# Patient Record
Sex: Male | Born: 1960
Health system: Southern US, Community
[De-identification: ages and names within clinical notes are randomized; demographics above are authoritative.]

## PROBLEM LIST (undated history)

## (undated) DIAGNOSIS — Z87442 Personal history of urinary calculi: Secondary | ICD-10-CM

## (undated) DIAGNOSIS — I10 Essential (primary) hypertension: Secondary | ICD-10-CM

## (undated) DIAGNOSIS — E785 Hyperlipidemia, unspecified: Secondary | ICD-10-CM

## (undated) DIAGNOSIS — I2510 Atherosclerotic heart disease of native coronary artery without angina pectoris: Secondary | ICD-10-CM

## (undated) DIAGNOSIS — F319 Bipolar disorder, unspecified: Secondary | ICD-10-CM

## (undated) DIAGNOSIS — I639 Cerebral infarction, unspecified: Secondary | ICD-10-CM

## (undated) DIAGNOSIS — G459 Transient cerebral ischemic attack, unspecified: Secondary | ICD-10-CM

## (undated) DIAGNOSIS — R112 Nausea with vomiting, unspecified: Secondary | ICD-10-CM

## (undated) DIAGNOSIS — K219 Gastro-esophageal reflux disease without esophagitis: Secondary | ICD-10-CM

## (undated) DIAGNOSIS — R0602 Shortness of breath: Secondary | ICD-10-CM

## (undated) DIAGNOSIS — N289 Disorder of kidney and ureter, unspecified: Secondary | ICD-10-CM

## (undated) DIAGNOSIS — F99 Mental disorder, not otherwise specified: Secondary | ICD-10-CM

## (undated) DIAGNOSIS — I4892 Unspecified atrial flutter: Secondary | ICD-10-CM

## (undated) DIAGNOSIS — J189 Pneumonia, unspecified organism: Secondary | ICD-10-CM

## (undated) DIAGNOSIS — Z9889 Other specified postprocedural states: Secondary | ICD-10-CM

## (undated) HISTORY — PX: LUMBAR DISC SURGERY: SHX700

## (undated) HISTORY — PX: KNEE ARTHROSCOPY: SUR90

## (undated) HISTORY — PX: HEMORROIDECTOMY: SUR656

## (undated) HISTORY — PX: TONSILLECTOMY AND ADENOIDECTOMY: SUR1326

---

## 1998-08-31 ENCOUNTER — Ambulatory Visit (HOSPITAL_BASED_OUTPATIENT_CLINIC_OR_DEPARTMENT_OTHER): Admission: RE | Admit: 1998-08-31 | Discharge: 1998-08-31 | Payer: Self-pay | Admitting: General Surgery

## 1998-11-03 ENCOUNTER — Emergency Department (HOSPITAL_COMMUNITY): Admission: EM | Admit: 1998-11-03 | Discharge: 1998-11-03 | Payer: Self-pay

## 2000-01-06 ENCOUNTER — Encounter: Payer: Self-pay | Admitting: Emergency Medicine

## 2000-01-06 ENCOUNTER — Emergency Department (HOSPITAL_COMMUNITY): Admission: EM | Admit: 2000-01-06 | Discharge: 2000-01-06 | Payer: Self-pay | Admitting: Emergency Medicine

## 2000-05-01 ENCOUNTER — Emergency Department (HOSPITAL_COMMUNITY): Admission: EM | Admit: 2000-05-01 | Discharge: 2000-05-01 | Payer: Self-pay | Admitting: Emergency Medicine

## 2000-05-17 ENCOUNTER — Encounter: Payer: Self-pay | Admitting: Emergency Medicine

## 2000-05-17 ENCOUNTER — Emergency Department (HOSPITAL_COMMUNITY): Admission: EM | Admit: 2000-05-17 | Discharge: 2000-05-17 | Payer: Self-pay | Admitting: Emergency Medicine

## 2000-10-20 ENCOUNTER — Encounter: Payer: Self-pay | Admitting: Emergency Medicine

## 2000-10-20 ENCOUNTER — Emergency Department (HOSPITAL_COMMUNITY): Admission: EM | Admit: 2000-10-20 | Discharge: 2000-10-20 | Payer: Self-pay | Admitting: Emergency Medicine

## 2000-10-27 ENCOUNTER — Encounter: Payer: Self-pay | Admitting: Specialist

## 2000-10-27 ENCOUNTER — Ambulatory Visit (HOSPITAL_COMMUNITY): Admission: RE | Admit: 2000-10-27 | Discharge: 2000-10-27 | Payer: Self-pay | Admitting: Specialist

## 2001-03-02 ENCOUNTER — Encounter: Admission: RE | Admit: 2001-03-02 | Discharge: 2001-05-31 | Payer: Self-pay | Admitting: Anesthesiology

## 2001-03-19 ENCOUNTER — Emergency Department (HOSPITAL_COMMUNITY): Admission: EM | Admit: 2001-03-19 | Discharge: 2001-03-19 | Payer: Self-pay | Admitting: Emergency Medicine

## 2001-07-05 ENCOUNTER — Encounter: Payer: Self-pay | Admitting: Emergency Medicine

## 2001-07-05 ENCOUNTER — Emergency Department (HOSPITAL_COMMUNITY): Admission: EM | Admit: 2001-07-05 | Discharge: 2001-07-05 | Payer: Self-pay | Admitting: Emergency Medicine

## 2001-07-28 ENCOUNTER — Encounter: Admission: RE | Admit: 2001-07-28 | Discharge: 2001-08-14 | Payer: Self-pay | Admitting: Anesthesiology

## 2002-09-30 ENCOUNTER — Emergency Department (HOSPITAL_COMMUNITY): Admission: EM | Admit: 2002-09-30 | Discharge: 2002-10-01 | Payer: Self-pay | Admitting: Emergency Medicine

## 2002-09-30 ENCOUNTER — Encounter: Payer: Self-pay | Admitting: Emergency Medicine

## 2002-11-04 ENCOUNTER — Emergency Department (HOSPITAL_COMMUNITY): Admission: EM | Admit: 2002-11-04 | Discharge: 2002-11-04 | Payer: Self-pay | Admitting: Emergency Medicine

## 2002-12-11 ENCOUNTER — Emergency Department (HOSPITAL_COMMUNITY): Admission: EM | Admit: 2002-12-11 | Discharge: 2002-12-11 | Payer: Self-pay

## 2004-09-29 ENCOUNTER — Emergency Department (HOSPITAL_COMMUNITY): Admission: EM | Admit: 2004-09-29 | Discharge: 2004-09-29 | Payer: Self-pay | Admitting: Emergency Medicine

## 2005-02-11 ENCOUNTER — Ambulatory Visit: Payer: Self-pay | Admitting: Family Medicine

## 2005-02-14 ENCOUNTER — Ambulatory Visit: Payer: Self-pay | Admitting: Family Medicine

## 2005-02-20 ENCOUNTER — Ambulatory Visit: Payer: Self-pay | Admitting: Family Medicine

## 2005-02-25 ENCOUNTER — Ambulatory Visit: Payer: Self-pay | Admitting: Family Medicine

## 2005-10-20 ENCOUNTER — Emergency Department (HOSPITAL_COMMUNITY): Admission: EM | Admit: 2005-10-20 | Discharge: 2005-10-20 | Payer: Self-pay | Admitting: Emergency Medicine

## 2006-05-27 ENCOUNTER — Ambulatory Visit: Payer: Self-pay | Admitting: Family Medicine

## 2007-02-01 ENCOUNTER — Emergency Department (HOSPITAL_COMMUNITY): Admission: EM | Admit: 2007-02-01 | Discharge: 2007-02-01 | Payer: Self-pay | Admitting: Emergency Medicine

## 2008-04-20 ENCOUNTER — Emergency Department (HOSPITAL_COMMUNITY): Admission: EM | Admit: 2008-04-20 | Discharge: 2008-04-20 | Payer: Self-pay | Admitting: Certified Registered"

## 2008-04-27 ENCOUNTER — Emergency Department (HOSPITAL_COMMUNITY): Admission: EM | Admit: 2008-04-27 | Discharge: 2008-04-27 | Payer: Self-pay | Admitting: Emergency Medicine

## 2008-05-26 ENCOUNTER — Ambulatory Visit: Payer: Self-pay | Admitting: *Deleted

## 2008-05-26 ENCOUNTER — Ambulatory Visit: Payer: Self-pay | Admitting: Internal Medicine

## 2008-07-12 ENCOUNTER — Ambulatory Visit: Payer: Self-pay | Admitting: Internal Medicine

## 2008-08-04 ENCOUNTER — Ambulatory Visit: Payer: Self-pay | Admitting: Internal Medicine

## 2008-10-19 ENCOUNTER — Ambulatory Visit: Payer: Self-pay | Admitting: Internal Medicine

## 2008-12-21 ENCOUNTER — Ambulatory Visit: Payer: Self-pay | Admitting: Internal Medicine

## 2009-02-09 ENCOUNTER — Encounter (INDEPENDENT_AMBULATORY_CARE_PROVIDER_SITE_OTHER): Payer: Self-pay | Admitting: Adult Health

## 2009-02-09 ENCOUNTER — Ambulatory Visit: Payer: Self-pay | Admitting: Internal Medicine

## 2009-02-09 LAB — CONVERTED CEMR LAB
ALT: 14 units/L (ref 0–53)
AST: 14 units/L (ref 0–37)
Albumin: 4.5 g/dL (ref 3.5–5.2)
Basophils Absolute: 0.1 10*3/uL (ref 0.0–0.1)
Basophils Relative: 1 % (ref 0–1)
CO2: 20 meq/L (ref 19–32)
Calcium: 9.3 mg/dL (ref 8.4–10.5)
Cholesterol: 160 mg/dL (ref 0–200)
Glucose, Bld: 83 mg/dL (ref 70–99)
HDL: 26 mg/dL — ABNORMAL LOW (ref >39)
LDL Cholesterol: 90 mg/dL (ref 0–99)
Lymphocytes Relative: 26 % (ref 12–46)
Lymphs Abs: 2.9 10*3/uL (ref 0.7–4.0)
MCHC: 30.5 g/dL (ref 30.0–36.0)
Monocytes Absolute: 0.5 10*3/uL (ref 0.1–1.0)
Monocytes Relative: 4 % (ref 3–12)
Neutro Abs: 7.3 10*3/uL (ref 1.7–7.7)
Neutrophils Relative %: 64 % (ref 43–77)
RBC: 5.26 M/uL (ref 4.22–5.81)
Total Bilirubin: 0.4 mg/dL (ref 0.3–1.2)
Total CHOL/HDL Ratio: 6.2
Total Protein: 6.6 g/dL (ref 6.0–8.3)
VLDL: 44 mg/dL — ABNORMAL HIGH (ref 0–40)

## 2009-02-15 ENCOUNTER — Ambulatory Visit: Payer: Self-pay | Admitting: Internal Medicine

## 2009-05-16 ENCOUNTER — Ambulatory Visit: Payer: Self-pay | Admitting: Internal Medicine

## 2009-05-22 ENCOUNTER — Ambulatory Visit (HOSPITAL_COMMUNITY): Admission: RE | Admit: 2009-05-22 | Discharge: 2009-05-22 | Payer: Self-pay | Admitting: Internal Medicine

## 2009-06-27 ENCOUNTER — Ambulatory Visit: Payer: Self-pay | Admitting: Internal Medicine

## 2009-08-14 ENCOUNTER — Ambulatory Visit: Payer: Self-pay | Admitting: Internal Medicine

## 2009-10-11 ENCOUNTER — Ambulatory Visit: Payer: Self-pay | Admitting: Internal Medicine

## 2009-12-21 ENCOUNTER — Encounter (INDEPENDENT_AMBULATORY_CARE_PROVIDER_SITE_OTHER): Payer: Self-pay | Admitting: Family Medicine

## 2009-12-21 ENCOUNTER — Ambulatory Visit: Payer: Self-pay | Admitting: Internal Medicine

## 2009-12-21 DIAGNOSIS — IMO0002 Reserved for concepts with insufficient information to code with codable children: Secondary | ICD-10-CM

## 2009-12-21 DIAGNOSIS — R079 Chest pain, unspecified: Secondary | ICD-10-CM

## 2009-12-21 DIAGNOSIS — G43909 Migraine, unspecified, not intractable, without status migrainosus: Secondary | ICD-10-CM | POA: Insufficient documentation

## 2009-12-21 DIAGNOSIS — I10 Essential (primary) hypertension: Secondary | ICD-10-CM | POA: Insufficient documentation

## 2009-12-21 DIAGNOSIS — E119 Type 2 diabetes mellitus without complications: Secondary | ICD-10-CM | POA: Insufficient documentation

## 2009-12-21 DIAGNOSIS — R3129 Other microscopic hematuria: Secondary | ICD-10-CM | POA: Insufficient documentation

## 2009-12-21 DIAGNOSIS — M25569 Pain in unspecified knee: Secondary | ICD-10-CM | POA: Insufficient documentation

## 2009-12-21 LAB — CONVERTED CEMR LAB
Barbiturate Quant, Ur: NEGATIVE
Creatinine,U: 158.3 mg/dL
Methadone: NEGATIVE
Microalb, Ur: 1.43 mg/dL (ref 0.00–1.89)

## 2010-03-07 ENCOUNTER — Ambulatory Visit: Payer: Self-pay | Admitting: Internal Medicine

## 2010-03-07 LAB — CONVERTED CEMR LAB
CO2: 21 meq/L (ref 19–32)
Cholesterol: 162 mg/dL (ref 0–200)
Creatinine, Ser: 0.81 mg/dL (ref 0.40–1.50)
Glucose, Bld: 105 mg/dL — ABNORMAL HIGH (ref 70–99)
HCV Ab: NEGATIVE
HDL: 35 mg/dL — ABNORMAL LOW (ref >39)
Hep B S Ab: NEGATIVE
LDL Cholesterol: 101 mg/dL — ABNORMAL HIGH (ref 0–99)
Potassium: 3.9 meq/L (ref 3.5–5.3)
Sodium: 141 meq/L (ref 135–145)
Total CHOL/HDL Ratio: 4.6
Triglycerides: 132 mg/dL (ref <150)
VLDL: 26 mg/dL (ref 0–40)

## 2010-03-11 ENCOUNTER — Ambulatory Visit: Payer: Self-pay | Admitting: Internal Medicine

## 2010-03-14 ENCOUNTER — Encounter (INDEPENDENT_AMBULATORY_CARE_PROVIDER_SITE_OTHER): Payer: Self-pay | Admitting: *Deleted

## 2010-03-21 ENCOUNTER — Ambulatory Visit: Payer: Self-pay | Admitting: Internal Medicine

## 2010-03-25 ENCOUNTER — Ambulatory Visit: Payer: Self-pay | Admitting: Internal Medicine

## 2010-04-29 ENCOUNTER — Telehealth (INDEPENDENT_AMBULATORY_CARE_PROVIDER_SITE_OTHER): Payer: Self-pay | Admitting: *Deleted

## 2010-04-30 ENCOUNTER — Ambulatory Visit: Payer: Self-pay | Admitting: Adult Health

## 2010-04-30 ENCOUNTER — Ambulatory Visit (HOSPITAL_COMMUNITY): Admission: RE | Admit: 2010-04-30 | Discharge: 2010-04-30 | Payer: Self-pay | Admitting: Internal Medicine

## 2010-04-30 LAB — CONVERTED CEMR LAB
ALT: 17 units/L (ref 0–53)
AST: 15 units/L (ref 0–37)
Alkaline Phosphatase: 71 units/L (ref 39–117)
BUN: 20 mg/dL (ref 6–23)
Basophils Relative: 0 % (ref 0–1)
CO2: 24 meq/L (ref 19–32)
Calcium: 9.5 mg/dL (ref 8.4–10.5)
Creatinine, Ser: 0.87 mg/dL (ref 0.40–1.50)
Eosinophils Absolute: 0.5 10*3/uL (ref 0.0–0.7)
HCT: 47.4 % (ref 39.0–52.0)
Hemoglobin: 14.8 g/dL (ref 13.0–17.0)
MCHC: 31.2 g/dL (ref 30.0–36.0)
Monocytes Absolute: 0.6 10*3/uL (ref 0.1–1.0)
Monocytes Relative: 4 % (ref 3–12)
Neutrophils Relative %: 76 % (ref 43–77)
Platelets: 315 10*3/uL (ref 150–400)
Sodium: 140 meq/L (ref 135–145)

## 2010-05-03 ENCOUNTER — Ambulatory Visit: Payer: Self-pay | Admitting: Internal Medicine

## 2010-05-08 ENCOUNTER — Ambulatory Visit: Payer: Self-pay | Admitting: Internal Medicine

## 2010-05-08 LAB — CONVERTED CEMR LAB
ALT: 18 units/L (ref 0–53)
AST: 13 units/L (ref 0–37)
Albumin: 4.9 g/dL (ref 3.5–5.2)
Alkaline Phosphatase: 81 units/L (ref 39–117)
BUN: 19 mg/dL (ref 6–23)
Basophils Absolute: 0.1 10*3/uL (ref 0.0–0.1)
Calcium: 9.6 mg/dL (ref 8.4–10.5)
Eosinophils Absolute: 0.4 10*3/uL (ref 0.0–0.7)
Lipase: 21 units/L (ref 0–75)
Lymphocytes Relative: 24 % (ref 12–46)
Lymphs Abs: 3.3 10*3/uL (ref 0.7–4.0)
MCHC: 32.3 g/dL (ref 30.0–36.0)
Microalb, Ur: 3.43 mg/dL — ABNORMAL HIGH (ref 0.00–1.89)
Monocytes Absolute: 0.8 10*3/uL (ref 0.1–1.0)
Neutrophils Relative %: 67 % (ref 43–77)
RBC: 5.19 M/uL (ref 4.22–5.81)
Total Protein: 7.3 g/dL (ref 6.0–8.3)
WBC: 14 10*3/uL — ABNORMAL HIGH (ref 4.0–10.5)

## 2010-05-10 ENCOUNTER — Ambulatory Visit (HOSPITAL_COMMUNITY): Admission: RE | Admit: 2010-05-10 | Discharge: 2010-05-10 | Payer: Self-pay | Admitting: Internal Medicine

## 2010-05-21 ENCOUNTER — Encounter (INDEPENDENT_AMBULATORY_CARE_PROVIDER_SITE_OTHER): Payer: Self-pay | Admitting: Adult Health

## 2010-05-21 ENCOUNTER — Ambulatory Visit: Payer: Self-pay | Admitting: Internal Medicine

## 2010-05-21 LAB — CONVERTED CEMR LAB
BUN: 11 mg/dL (ref 6–23)
Calcium: 9.2 mg/dL (ref 8.4–10.5)
Creatinine, Ser: 0.74 mg/dL (ref 0.40–1.50)
Eosinophils Absolute: 0.2 10*3/uL (ref 0.0–0.7)
HCT: 46.1 % (ref 39.0–52.0)
Hemoglobin: 14.9 g/dL (ref 13.0–17.0)
Lymphs Abs: 2.2 10*3/uL (ref 0.7–4.0)
Monocytes Absolute: 0.8 10*3/uL (ref 0.1–1.0)
Monocytes Relative: 6 % (ref 3–12)
Neutro Abs: 11.1 10*3/uL — ABNORMAL HIGH (ref 1.7–7.7)
Neutrophils Relative %: 77 % (ref 43–77)
Potassium: 4.1 meq/L (ref 3.5–5.3)
RBC: 5.11 M/uL (ref 4.22–5.81)

## 2010-06-20 ENCOUNTER — Encounter (INDEPENDENT_AMBULATORY_CARE_PROVIDER_SITE_OTHER): Payer: Self-pay | Admitting: *Deleted

## 2010-06-20 ENCOUNTER — Ambulatory Visit: Payer: Self-pay | Admitting: Internal Medicine

## 2010-06-20 LAB — CONVERTED CEMR LAB
Eosinophils Absolute: 0.4 10*3/uL (ref 0.0–0.7)
Eosinophils Relative: 4 % (ref 0–5)
Hemoglobin: 14.1 g/dL (ref 13.0–17.0)
Lipase: 14 units/L (ref 0–75)
Monocytes Absolute: 0.6 10*3/uL (ref 0.1–1.0)
Neutro Abs: 6.5 10*3/uL (ref 1.7–7.7)
RBC: 4.94 M/uL (ref 4.22–5.81)
Sed Rate: 5 mm/hr (ref 0–16)
WBC: 9.6 10*3/uL (ref 4.0–10.5)

## 2010-07-25 ENCOUNTER — Ambulatory Visit: Payer: Self-pay | Admitting: Gastroenterology

## 2010-07-25 ENCOUNTER — Encounter (INDEPENDENT_AMBULATORY_CARE_PROVIDER_SITE_OTHER): Payer: Self-pay | Admitting: *Deleted

## 2010-07-25 DIAGNOSIS — K5730 Diverticulosis of large intestine without perforation or abscess without bleeding: Secondary | ICD-10-CM

## 2010-07-25 DIAGNOSIS — K219 Gastro-esophageal reflux disease without esophagitis: Secondary | ICD-10-CM | POA: Insufficient documentation

## 2010-07-25 DIAGNOSIS — F909 Attention-deficit hyperactivity disorder, unspecified type: Secondary | ICD-10-CM | POA: Insufficient documentation

## 2010-07-25 DIAGNOSIS — R1032 Left lower quadrant pain: Secondary | ICD-10-CM | POA: Insufficient documentation

## 2010-07-25 LAB — CONVERTED CEMR LAB
Amylase: 74 units/L (ref 27–131)
CRP, High Sensitivity: 7.02 — ABNORMAL HIGH (ref 0.00–5.00)
Ferritin: 65.5 ng/mL (ref 22.0–322.0)
Folate: 9 ng/mL
Lipase: 33 units/L (ref 11.0–59.0)
Sed Rate: 7 mm/hr (ref 0–22)
Vitamin B-12: 308 pg/mL (ref 211–911)

## 2010-08-16 ENCOUNTER — Ambulatory Visit: Payer: Self-pay | Admitting: Gastroenterology

## 2010-08-22 ENCOUNTER — Ambulatory Visit: Payer: Self-pay | Admitting: Gastroenterology

## 2010-08-22 ENCOUNTER — Ambulatory Visit (HOSPITAL_COMMUNITY): Admission: RE | Admit: 2010-08-22 | Discharge: 2010-08-22 | Payer: Self-pay | Admitting: Gastroenterology

## 2010-08-27 ENCOUNTER — Encounter: Payer: Self-pay | Admitting: Gastroenterology

## 2010-10-05 ENCOUNTER — Emergency Department (HOSPITAL_COMMUNITY): Admission: EM | Admit: 2010-10-05 | Discharge: 2010-10-05 | Payer: Self-pay | Admitting: Emergency Medicine

## 2010-10-10 ENCOUNTER — Ambulatory Visit: Payer: Self-pay | Admitting: Internal Medicine

## 2011-01-07 ENCOUNTER — Encounter (INDEPENDENT_AMBULATORY_CARE_PROVIDER_SITE_OTHER): Payer: Self-pay | Admitting: Family Medicine

## 2011-01-07 LAB — CONVERTED CEMR LAB
Alkaline Phosphatase: 90 units/L (ref 39–117)
BUN: 21 mg/dL (ref 6–23)
CO2: 25 meq/L (ref 19–32)
Potassium: 4.3 meq/L (ref 3.5–5.3)
Sodium: 140 meq/L (ref 135–145)
Total Bilirubin: 0.3 mg/dL (ref 0.3–1.2)
Total Protein: 6.7 g/dL (ref 6.0–8.3)
VLDL: 22 mg/dL (ref 0–40)

## 2011-01-14 NOTE — Procedures (Signed)
Summary: Colonoscopy  Patient: Jerry Shaffer Note: All result statuses are Final unless otherwise noted.  Tests: (1) Colonoscopy (COL)   COL Colonoscopy           DONE     [facility]     [address] [place], [place]  [address]           COLONOSCOPY PROCEDURE REPORT           PATIENT:  Jerry, Shaffer  MR#:  [id number]     BIRTHDATE:  12-Sep-1961, 48 yrs. old  GENDER:  male     ENDOSCOPIST:  Rachael Fee, MD     REF. BY:  Vania Rea. Jarold Motto, M.D.     PROCEDURE DATE:  08/22/2010     PROCEDURE:  Colonoscopy with snare polypectomy     ASA CLASS:  Class II     INDICATIONS:  left sided abdominal pain     MEDICATIONS:   MAC sedation, administered by CRNA           DESCRIPTION OF PROCEDURE:   After the risks benefits and     alternatives of the procedure were thoroughly explained, informed     consent was obtained.  Digital rectal exam was performed and     revealed no rectal masses.   The [id number] ([id number]) endoscope was     introduced through the anus and advanced to the cecum, which was     identified by both the appendix and ileocecal valve, without     limitations.  The quality of the prep was good, using Trilyte.     The instrument was then slowly withdrawn as the colon was fully     examined.     <<PROCEDUREIMAGES>>     FINDINGS:  Mild diverticulosis was found in left colon (see     image004).  A sessile polyp was found in the sigmoid colon. This     was removed with cold snare and sent to pathology (see image005).     This was otherwise a normal examination of the colon (see image002,     image003, and image006).   Retroflexed views in the rectum     revealed no abnormalities.    The scope was then withdrawn from     the patient and the procedure completed.     COMPLICATIONS:  None     ENDOSCOPIC IMPRESSION:     1) Mild diverticulosis in left colon     2) Small sessile polyp in the sigmoid colon. Removed and sent to     pathology.     3) Otherwise  normal examination           RECOMMENDATIONS:     1) If the polyp(s) removed today are proven to be adenomatous     (pre-cancerous) polyps, you will need a repeat colonoscopy in 5     years. Otherwise you should continue to follow colorectal cancer     screening guidelines for "routine risk" patients with colonoscopy     in 10 years.     2) You will receive a letter within 1-2 weeks with the results     of your biopsy as well as final recommendations. Please call my     office if you have not received a letter after 3 weeks.     3) Follow up with Dr. Sheryn Bison as needed           ______________________________  Rachael Fee, MD           cc: Donia Guiles, MD           n.     Rosalie Doctor:   Rachael Fee at 08/22/2010 01:01 PM           Meta Hatchet, [phone]  Note: An exclamation mark (!) indicates a result that was not dispersed into the flowsheet. Document Creation Date: 08/22/2010 1:03 PM _______________________________________________________________________  (1) Order result status: Final Collection or observation date-time: 08/22/2010 12:54 Requested date-time:  Receipt date-time:  Reported date-time:  Referring Physician:   Ordering Physician: Rob Bunting [phone]) Specimen Source:  Source: Launa Grill Order Number: [id number] Lab site:   Appended Document: Colonoscopy letter mailed and recall in IDX and EMR   Clinical Lists Changes  Observations: Added new observation of COLONNXTDUE: 08/2020 (08/27/2010 8:18)

## 2011-01-14 NOTE — Miscellaneous (Signed)
Summary: Office Visit ([facility])      Physical Exam  General:  Well-developed,well-nourished,in no acute distress; alert,appropriate and cooperative throughout examination Head:  Normocephalic and atraumatic without obvious abnormalities. No apparent alopecia or balding. Eyes:  No corneal or conjunctival inflammation noted. EOMI. Perrla.  Lungs:  Normal respiratory effort, chest expands symmetrically. Lungs are clear to auscultation, no crackles or wheezes. Heart:  Normal rate and regular rhythm. S1 and S2 normal without gallop, murmur, click, rub or other extra sounds. Abdomen:  Bowel sounds positive,abdomen soft and non-tender without masses, organomegaly or hernias noted.   Impression & Recommendations:  Problem # 1:  DIABETES MELLITUS, CONTROLLED (ICD-250.00) Assessment Deteriorated Controlled, Testing UTD, declines flu shot, pneumovax UTD Retasure last in 07/2007. Reschedule today Sch for blood work including CMP and Lipids in 02/2010.  Problem # 2:  HYPERTENSION NEC (ICD-997.91) Pt states lack of sleep. Not taking BP meds, has been taking 8 hr energy all week and diet Dr. Reino Kent. Ran out of ritalin 3 days ago.  Takes no medications for BP, no h/o HTN. Instructed to keep BS at home, goal <130/80. Decrease caffeine use.  Problem # 3:  KNEE PAIN, LEFT (ICD-719.46)  H/o vicodin use. Takes as needed, stated has used only 84 over past year. Vicodin refill today for use as needed.  His updated medication list for this problem includes:    Vicodin 5-500 Mg Tabs (Hydrocodone-acetaminophen) .Marland Kitchen... 1 by mouth q 6 hrs as needed pain  Problem # 4:  MIGRAINE HEADACHE (ICD-346.90)  Per pt since age [age]. Currently getting about 3/week, taking Excedrin. In past has experienced relief from imitrex. +photophobia/phonophobia, aura of spots in front of eyes. HAs last hrs to days.  His updated medication list for this problem includes:    Vicodin 5-500 Mg Tabs  (Hydrocodone-acetaminophen) .Marland Kitchen... 1 by mouth q 6 hrs as needed pain    Imitrex 50 Mg Tabs (Sumatriptan succinate) .Marland Kitchen... 1-2 tabs by mouth as needed migraine, make take another dose after 2hr  Problem # 5:  CHEST PAIN, INTERMITTENT (ICD-786.50) Happens at any time, No SOB that makes him worry, lasts to 3-4 hrs, relieved with mylanta, increased with deep breath. L sternal in location, pressure, no radiation +DM Currently taking famotidine for GER, but not resolving it. ECG with no signs of ischemia. Pt cautioned against emergency. States Prilosec and Nexium not helped in the past. Re-eval next visit.  Problem # 6:  MICROSCOPIC HEMATURIA (ICD-599.72) Last couple of u/a's with small blood If u/a has blood, send to micro for confirmation.  Complete Medication List: 1)  Vicodin 5-500 Mg Tabs (Hydrocodone-acetaminophen) .Marland Kitchen.. 1 by mouth q 6 hrs as needed pain 2)  Aciphex 20 Mg Tbec (Rabeprazole sodium) .Marland Kitchen.. 1 by mouth daily as needed reflux 3)  Imitrex 50 Mg Tabs (Sumatriptan succinate) .Marland Kitchen.. 1-2 tabs by mouth as needed migraine, make take another dose after 2hr  Prescriptions: IMITREX 50 MG TABS (SUMATRIPTAN SUCCINATE) 1-2 tabs by mouth as needed migraine, make take another dose after 2hr  #30 x 1   Entered and Authorized by:   Syliva Overman MD   Signed by:   Syliva Overman MD on 12/21/2009   Method used:   Print then Give to Patient   RxID:   [id number] ACIPHEX 20 MG TBEC (RABEPRAZOLE SODIUM) 1 by mouth daily as needed reflux  #30 x 5   Entered and Authorized by:   Syliva Overman MD   Signed by:   Syliva Overman MD on 12/21/2009  Method used:   Print then Give to Patient   RxID:   [id number] VICODIN 5-500 MG TABS (HYDROCODONE-ACETAMINOPHEN) 1 by mouth q 6 hrs as needed pain  #90 x 0   Entered and Authorized by:   Syliva Overman MD   Signed by:   Syliva Overman MD on 12/21/2009   Method used:   Print then Give to Patient   RxID:   [id number]

## 2011-01-14 NOTE — Letter (Signed)
Summary: New Patient letter  [facility]  [address] [place], [place] [address]   Phone: [phone]  Fax: [phone]       06/20/2010 MRN: [id number]  Jerry Shaffer [address] [place], [place]  [address]  Dear [name],  Welcome to the Gastroenterology Division at [facility].    You are scheduled to see Dr. Jarold Motto on 07/25/2010 at 9:30AM on the 3rd floor at [facility], [address]  We ask that you try to arrive at our office 15 minutes prior to your appointment time to allow for check-in.  We would like you to complete the enclosed self-administered evaluation form prior to your visit and bring it with you on the day of your appointment.  We will review it with you.  Also, please bring a complete list of all your medications or, if you prefer, bring the medication bottles and we will list them.  Please bring your insurance card so that we may make a copy of it.  If your insurance requires a referral to see a specialist, please bring your referral form from your primary care physician.  Co-payments are due at the time of your visit and may be paid by cash, check or credit card.     Your office visit will consist of a consult with your physician (includes a physical exam), any laboratory testing he/she may order, scheduling of any necessary diagnostic testing (e.g. x-ray, ultrasound, CT-scan), and scheduling of a procedure (e.g. Endoscopy, Colonoscopy) if required.  Please allow enough time on your schedule to allow for any/all of these possibilities.    If you cannot keep your appointment, please call [phone] to cancel or reschedule prior to your appointment date.  This allows [place] the opportunity to schedule an appointment for another patient in need of care.  If you do not cancel or reschedule by 5 p.m. the business day prior to your appointment date, you will be charged a $50.00 late cancellation/no-show fee.    Thank you for choosing  [facility] for your medical needs.  We appreciate the opportunity to care for you.  Please visit [place] at our website  to learn more about our practice.                     Sincerely,                                                             The Gastroenterology Division

## 2011-01-14 NOTE — Progress Notes (Signed)
Summary: triage/left side abd pain  Phone Note Call from Patient   Reason for Call: Talk to Nurse Summary of Call: patient presented at front desk with left side abd. pain x 4 days.Marland KitchenMarland KitchenHe states he took a vicodin he has for his knee pain and that has helped. He states he has a hx of ulcers.Marland KitchenHe is obese and works as a part Associate Professor and denies doing anything that might has pulled a muscle.Marland KitchenMarland KitchenHe denies throbbing pain.Marland Kitchenand says it hurts when he pokes it or breaths deeply.  States pain is 8-10 on pain scale at its worse. His last BM was this am and was formed, regular shape and brown..At the end of the stool was loose.Marland KitchenMarland KitchenHe denies any lumps or protrusions at the site of the pain.Marland KitchenMarland KitchenHe also denies any fever, nausea or vomiting.Marland KitchenAppointment made in acute schedule for tomorrow am...we have note today.Marland KitchenMarland KitchenAdvised patient should he experience nausea and vomiting or if pain becomes severe to go to ED.. Initial call taken by: Conchita Paris,  Apr 29, 2010 11:11 AM

## 2011-01-14 NOTE — Letter (Signed)
Summary: Results Letter  [facility]  [address] [place], [place] [address]   Phone: [phone]  Fax: [phone]        August 27, 2010 MRN: [id number]    PLATO ALSPAUGH [address] [place], [place]  [address]    Dear Mr. Bonsignore,   Good news.  The polyp(s) that were removed during your recent procedure were NOT pre-cancerous.  You should continue to follow current colorectal cancer screening guidelines with a repeat colonoscopy in 10 years.  We will therefore put your information in our reminder system and will contact you in 10 years to schedule a repeat procedure.  Please call if you have any questions or concerns.      Sincerely,  Rachael Fee MD  This letter has been electronically signed by your physician.  Appended Document: Results Letter letter mailed

## 2011-01-14 NOTE — Assessment & Plan Note (Signed)
Summary: ABD PAIN...AS.   History of Present Illness Visit Type: Initial Consult Primary GI MD: Jerry Bison MD FACP FAGA Primary Provider: Donia Guiles, MD Chief Complaint: Patient c/o 1 month constant LLQ abdominal pain. He also c/o fluctuating weight. He denies any change in bowels with the exception of some occasional loose stools. He denies any blood in the stool. He has had some subjective fever. 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 Reche Shaffer In [facility] for evaluation of 2 months of constant left low quadrant discomfort of unexplained etiology with negative CT scan recently except for mild diverticulosis. Also had a CT scan a year ago which was unremarkable, but the patient is unsure why that exam was done one year ago.  Patient is very frank and animated all subjects. He spent a large amount of I. time discussing various doctors and their misdiagnosis in his opinion of various medical matters.  From what I can ascertain, this constant left lower quadrant pain has no alleviating or worsening conditions. He has normally 2-3 loose bowel movements a day without melena or hematochezia. Lab data from [facility] shows a mild leukocytosis of 12,000 with normal hemoglobin. The patient apparently has had both upper and lower GI series when he was a teenager for reasons which are unclear. Despite his pain which he takes p.r.n. Vicodin, he denies GI complaints. However, on close questioning, he has had acid reflux for many years is on AcipHex 20 mg a day. He denies dysphagia, any hepatobiliary complaints, anorexia, weight loss, or history of hepatitis or pancreatitis.  He is recovering alcoholic but does not attend day meetings. He had hemorrhoidectomy x2 in the past and some orthopedic surgery. He has a history of adult attentional deficit disorder and is on Ritalin, also takes Klonopin 0.5 mg 2-3 times a day foralleged  intentional tremor,and has  migraine headaches and uses p.r.n. Imitrex. He also takes Lamictal in an unknown dosage daily. His Ritalin dose each is 20 mg 2 in the morning and one in the evening.   GI Review of Systems    Reports abdominal pain.     Location of  Abdominal pain: LLQ.    Denies acid reflux, belching, bloating, chest pain, dysphagia with liquids, dysphagia with solids, heartburn, loss of appetite, nausea, vomiting, vomiting blood, weight loss, and  weight gain.      Reports diarrhea.     Denies anal fissure, black tarry stools, change in bowel habit, constipation, diverticulosis, fecal incontinence, heme positive stool, hemorrhoids, irritable bowel syndrome, jaundice, light color stool, liver problems, rectal bleeding, and  rectal pain. Preventive Screening-Counseling & Management  Alcohol-Tobacco     Smoking Status: current      Drug Use:  no.      Current Medications (verified): 1)  Vicodin 5-500 Mg Tabs (Hydrocodone-Acetaminophen) .Marland Kitchen.. 1 By Mouth Q 6 Hrs As Needed Pain 2)  Aciphex 20 Mg Tbec (Rabeprazole Sodium) .Marland Kitchen.. 1 By Mouth Daily As Needed Reflux 3)  Imitrex 50 Mg Tabs (Sumatriptan Succinate) .Marland Kitchen.. 1-2 Tabs By Mouth As Needed Migraine, Make Take Another Dose After 2hr 4)  Klonopin 0.5 Mg Tabs (Clonazepam) .... Take 1 Tablet By Mouth Three Times A Day 5)  Ritalin 20 Mg Tabs (Methylphenidate Hcl) .... Take 2 Tablets By Mouth Every Morning and 1 Tablet Every Afternoon 6)  Epipen 0.3 Mg/0.66ml Devi (Epinephrine) .... Use As Needed 7)  Lamictal (Unknown Dosage) .... Take 1 Tablet By Mouth Once Daily  Allergies (verified):  1)  ! Pcn  Past History:  Past medical, surgical, family and social histories (including risk factors) reviewed for relevance to current acute and chronic problems.  Past Medical History: Alcoholism Anxiety Asthma Obesity diverticulosis  Past Surgical History: Tonsillectomy Hemorrhoidectomy x 2 right hand surgery-graft taken from right arm left knee surgery  Family  History: Reviewed history and no changes required. No FH of Colon Cancer: Family History of Diabetes: Uncles x 2 Family History of Heart Disease: Mother  Social History: Reviewed history and no changes required. Occupation: Security Patient currently smokes.  Alcohol Use - yes-rare Daily Caffeine Use 2-5 cups daily Illicit Drug Use - no Smoking Status:  current Drug Use:  no  Review of Systems       The patient complains of allergy/sinus, anxiety-new, back pain, cough, fever, headaches-new, sore throat, and thirst - excessive.  The patient denies anemia, arthritis/joint pain, blood in urine, breast changes/lumps, change in vision, confusion, coughing up blood, depression-new, fainting, hearing problems, heart murmur, heart rhythm changes, itching, menstrual pain, muscle pains/cramps, night sweats, nosebleeds, pregnancy symptoms, shortness of breath, skin rash, sleeping problems, swelling of feet/legs, swollen lymph glands, thirst - excessive , urination - excessive , urination changes/pain, urine leakage, vision changes, and voice change.    Vital Signs:  Patient profile:   50 year old male Height:      68 inches Weight:      254.25 pounds BMI:     38.80 BSA:     2.27 Pulse rate:   100 / minute Pulse rhythm:   regular BP sitting:   110 / 70  (right arm)  Vitals Entered By: Lamona Curl CMA Duncan Dull) (July 25, 2010 9:23 AM)  Physical Exam  General:  Well developed, well nourished, no acute distress.healthy appearing and obese.   Head:  Normocephalic and atraumatic. Eyes:  PERRLA, no icterus.exam deferred to patient's ophthalmologist.   Neck:  Supple; no masses or thyromegaly. Lungs:  Clear throughout to auscultation. Heart:  Regular rate and rhythm; no murmurs, rubs,  or bruits. Abdomen:  Soft, nontender and nondistended. No masses, hepatosplenomegaly or hernias noted. Normal bowel sounds. Rectal:  deferred until time of colonoscopy.   Msk:  Symmetrical with no gross  deformities. Normal posture. Extremities:  No clubbing, cyanosis, edema or deformities noted. Neurologic:  Alert and  oriented x4;  grossly normal neurologically. Cervical Nodes:  No significant cervical adenopathy. Psych:  Alert and cooperative. Normal mood and affect.   Impression & Recommendations:  Problem # 1:  ABDOMINAL PAIN, LEFT LOWER QUADRANT (ICD-789.04) Assessment Unchanged Unusual pattern of left lower quadrant pain which is out of proportion to any objective findings radiographically oral physical exam. There is no evidence of inguinal hernia, skin rash to suggest shingles, or obvious neurologic findings to suggest referral from lumbosacral spine disease. I will schedule colonoscopy with propofol sedation it is convenient to complete his workup. In the interim, we will try to secure Bentyl 10 mg t.i.d., and described a high fiber diet with daily Benefiber, and had shown him a patient education videow on diverticulosis. Some screening laboratory parameters have been ordered for review. Orders: TLB-B12 + Folate Pnl ([id number]/JXB) TLB-Ferritin (82728-FER) TLB-IBC Pnl (Iron/FE;Transferrin) (83550-IBC) TLB-CRP-High Sensitivity (C-Reactive Protein) (86140-FCRP) TLB-Amylase (82150-AMYL) TLB-Lipase (83690-LIPASE) TLB-Sedimentation Rate (ESR) (85652-ESR) T-Sprue Panel (Celiac Disease Aby Eval) (83516x3/[address])  Problem # 2:  GERD (ICD-530.81) Assessment: Improved Continue AcipHex 20 mg a day with standard antireflux maneuvers. 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 If his GI workup is otherwise negative I would suggest referral to a pain clinic for evaluation and also psychological complete evaluation and management of his multiple medications which are addictive in a patient who has a history of a chronic addiction disorder. As mentioned above, he is an alcoholic apparently in recovery although this is unclear.  Patient Instructions: 1)  Please pick up your medications at your pharmacy.  2)  Please begin Benefiber as directed below. 3)  We will call you to arrange the colonoscopy with Dr. Christella Hartigan at [facility]. 4)  High Fiber, Low Fat  Healthy Eating Plan brochure given.  5)  The medication list was reviewed and reconciled.  All changed / newly prescribed medications were explained.  A complete medication list was provided to the patient / caregiver. 6)  Copy sent to : Dr[facility] Donia Shaffer At [facility] 7)  Please continue current medications.  Prescriptions: BENTYL 10 MG CAPS (DICYCLOMINE HCL) Take 1 tablet by mouth three times a day  #90 x 5   Entered by:   Francee Piccolo CMA (AAMA)   Authorized by:   Mardella Layman MD [facility]   Signed by:   Francee Piccolo CMA (AAMA) on 07/25/2010   Method used:   Print then Give to Patient   RxID:   [id number]   Appended Document: ABD PAIN...AS. patty, can you please schedule him for propofol sedated colonoscopy at [facility], next available time  Appended Document: Orders Update/movi pt scheduled for 08/22/10 colon and 08/15/10  previsit.  Pt is aware   Clinical Lists Changes  Orders: Added new Test order of ZCOL (ZCOL) - Signed

## 2011-01-14 NOTE — Letter (Signed)
Summary: *HSN Results Follow up  [facility]  [address]   [place], [place] [address]   Phone: [phone]  Fax: [phone]      03/14/2010   Jerry Shaffer [address] [place], [place]  [address]   Dear  [name],                            ____S.Drinkard,FNP   ____D. Gore,FNP       ____B. McPherson,MD   ____V. Rankins,MD    ____E. Mulberry,MD    ____N. Daphine Deutscher, FNP  __x__D. Reche Dixon, MD    ____K. Philipp Deputy, MD    ____Other     This letter is to inform you that your recent test(s):  _______Pap Smear    ___x____Lab Test     _______X-ray    ____x___ is within acceptable limits  _______ requires a medication change  _______ requires a follow-up lab visit  _______ requires a follow-up visit with your provider   Comments:       _________________________________________________________ If you have any questions, please contact our office                     Sincerely,  Gaylyn Cheers RN [address]

## 2011-01-14 NOTE — Miscellaneous (Signed)
Summary: [facility]  Clinical Lists Changes  Medications: Added new medication of TRILYTE 420 GM  SOLR (PEG 3350-KCL-NA BICARB-NACL) As per prep instructions. - Signed Added new medication of DULCOLAX 5 MG  TBEC (BISACODYL) Day before procedure take 2 at 3pm and 2 at 8pm. - Signed Added new medication of REGLAN 10 MG  TABS (METOCLOPRAMIDE HCL) As per prep instructions. - Signed Rx of TRILYTE 420 GM  SOLR (PEG 3350-KCL-NA BICARB-NACL) As per prep instructions.;  #1 x 0;  Signed;  Entered by: Ezra Sites RN;  Authorized by: Rachael Fee MD;  Method used: Print then Give to Patient Rx of DULCOLAX 5 MG  TBEC (BISACODYL) Day before procedure take 2 at 3pm and 2 at 8pm.;  #4 x 0;  Signed;  Entered by: Ezra Sites RN;  Authorized by: Rachael Fee MD;  Method used: Print then Give to Patient Rx of REGLAN 10 MG  TABS (METOCLOPRAMIDE HCL) As per prep instructions.;  #2 x 0;  Signed;  Entered by: Ezra Sites RN;  Authorized by: Rachael Fee MD;  Method used: Print then Give to Patient Allergies: Added new allergy or adverse reaction of * BEE STINGS    Prescriptions: REGLAN 10 MG  TABS (METOCLOPRAMIDE HCL) As per prep instructions.  #2 x 0   Entered by:   Ezra Sites RN   Authorized by:   Rachael Fee MD   Signed by:   Ezra Sites RN on 08/16/2010   Method used:   Print then Give to Patient   RxID:   [id number] DULCOLAX 5 MG  TBEC (BISACODYL) Day before procedure take 2 at 3pm and 2 at 8pm.  #4 x 0   Entered by:   Ezra Sites RN   Authorized by:   Rachael Fee MD   Signed by:   Ezra Sites RN on 08/16/2010   Method used:   Print then Give to Patient   RxID:   [id number] TRILYTE 420 GM  SOLR (PEG 3350-KCL-NA BICARB-NACL) As per prep instructions.  #1 x 0   Entered by:   Ezra Sites RN   Authorized by:   Rachael Fee MD   Signed by:   Ezra Sites RN on 08/16/2010   Method used:   Print then Give to Patient   RxID:   [id number]   Appended Document: [facility] does not stock Movip[employer]-- only has [employer]

## 2011-01-14 NOTE — Letter (Signed)
Summary: Previsit letter  [facility]  [address] [place], [place] [address]   Phone: [phone]  Fax: [phone]       07/25/2010 MRN: [id number]  Jerry Shaffer [address] [place], [place]  [address]  Dear [name],  Welcome to the Gastroenterology Division at [facility].    You are scheduled to see a nurse for your pre-procedure visit on 08/15/10 at 2:30pm on the 3rd floor at [facility], [address]  We ask that you try to arrive at our office 15 minutes prior to your appointment time to allow for check-in.  Your nurse visit will consist of discussing your medical and surgical history, your immediate family medical history, and your medications.    Please bring a complete list of all your medications or, if you prefer, bring the medication bottles and we will list them.  We will need to be aware of both prescribed and over the counter drugs.  We will need to know exact dosage information as well.  If you are on blood thinners (Coumadin, Plavix, Aggrenox, Ticlid, etc.) please call our office today/prior to your appointment, as we need to consult with your physician about holding your medication.   Please be prepared to read and sign documents such as consent forms, a financial agreement, and acknowledgement forms.  If necessary, and with your consent, a friend or relative is welcome to sit-in on the nurse visit with you.  Please bring your insurance card so that we may make a copy of it.  If your insurance requires a referral to see a specialist, please bring your referral form from your primary care physician.  No co-pay is required for this nurse visit.     If you cannot keep your appointment, please call [phone] to cancel or reschedule prior to your appointment date.  This allows [place] the opportunity to schedule an appointment for another patient in need of care.    Thank you for choosing [facility] for your medical needs.  We  appreciate the opportunity to care for you.  Please visit [place] at our website  to learn more about our practice.                     Sincerely.                                                                                                                   The Gastroenterology Division

## 2011-01-17 NOTE — Procedures (Signed)
Summary: Colon Prep  Colon Prep   Imported By: Lester  08/21/2010 10:48:17  _____________________________________________________________________  External Attachment:    Type:   Image     Comment:   External Document

## 2011-02-25 IMAGING — CR DG CHEST 2V
2 series · 2 of 2 positions shown · non-contrast
Comparison: Abdominal films dated 04/30/2010

CLINICAL DATA: Low abdominal pain.  Shortness of breath and
wheezing.

CHEST - 2 VIEW

[w chest pa]
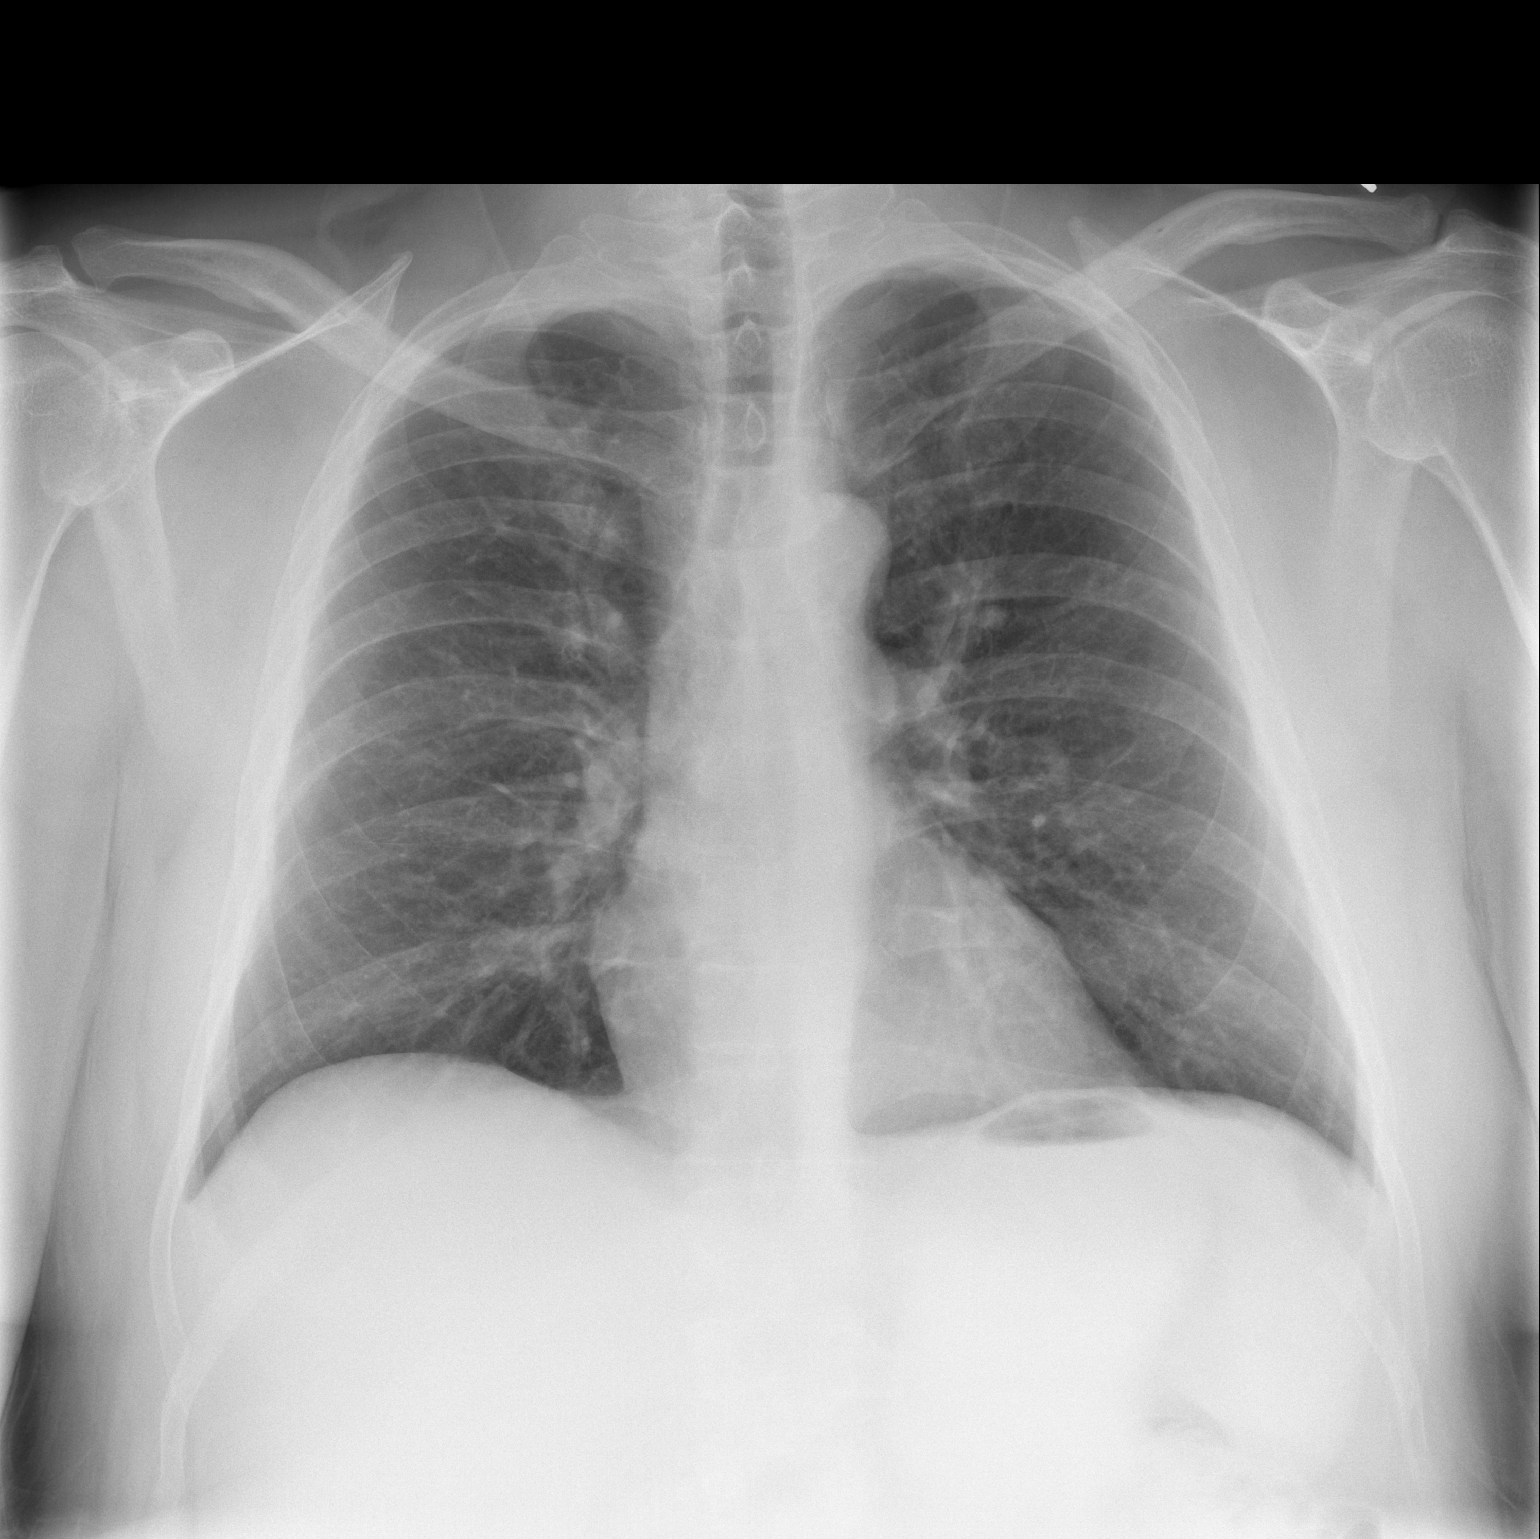

[w chest lat]
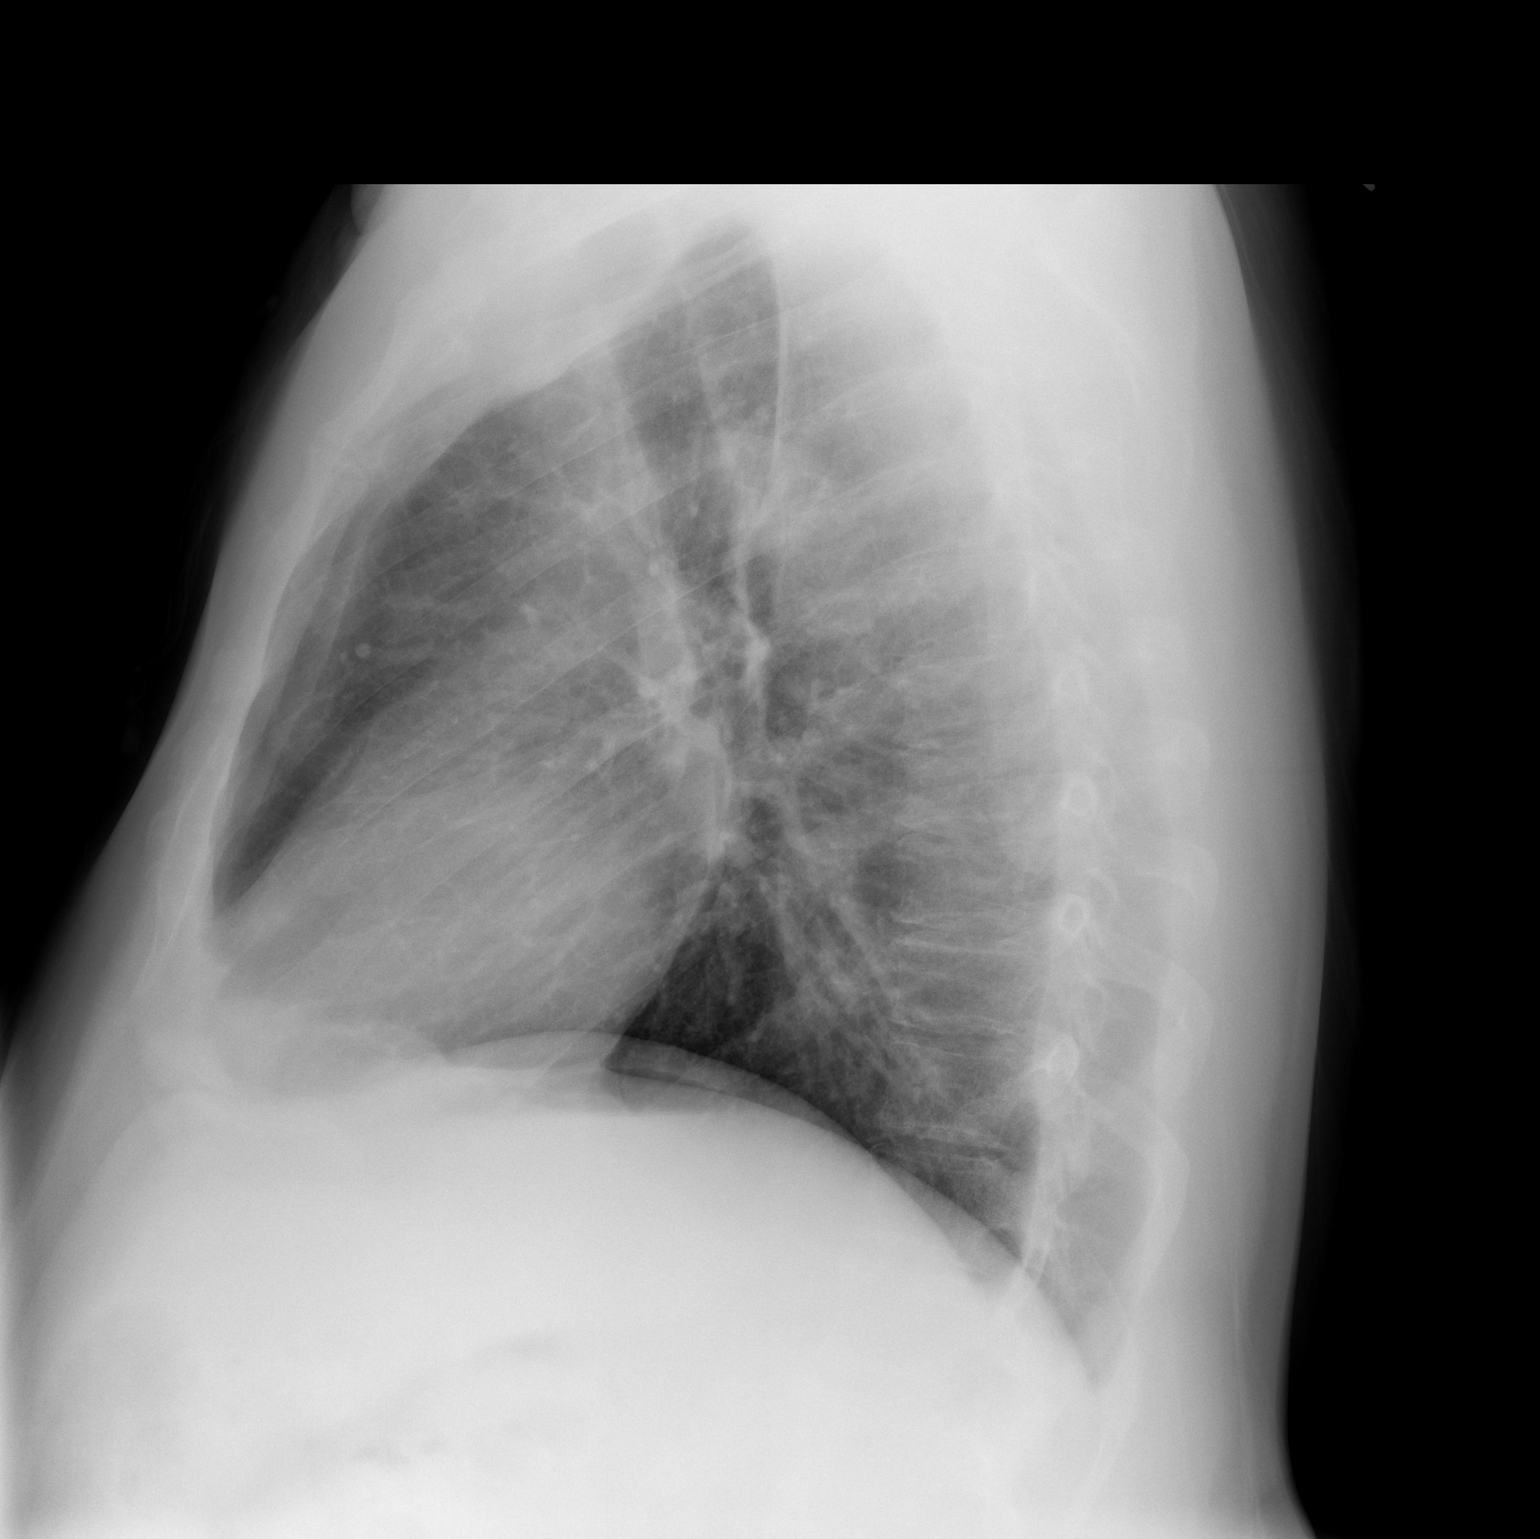

[2 of 2 positions shown; findings below may reference images not displayed]

FINDINGS: Two views of the chest demonstrate clear lungs.  Heart
and mediastinum are normal.  No evidence for edema or focal
airspace disease.  Bony structures are intact.
IMPRESSION: Normal chest examination.

## 2011-02-26 LAB — GLUCOSE, CAPILLARY: Glucose-Capillary: 151 mg/dL — ABNORMAL HIGH (ref 70–99)

## 2011-02-27 LAB — GLUCOSE, CAPILLARY: Glucose-Capillary: 102 mg/dL — ABNORMAL HIGH (ref 70–99)

## 2011-04-28 ENCOUNTER — Ambulatory Visit (HOSPITAL_BASED_OUTPATIENT_CLINIC_OR_DEPARTMENT_OTHER): Payer: Self-pay

## 2011-04-29 NOTE — Op Note (Signed)
NAME:  Jerry Shaffer, YONKERS NO.:  [id number]   MEDICAL RECORD NO.:  [id number]          PATIENT TYPE:  EMS   LOCATION:  [facility]                         FACILITY:  [facility]   PHYSICIAN:  Johnette Abraham, MD    DATE OF BIRTH:  1961-10-01   DATE OF PROCEDURE:  DATE OF DISCHARGE:                               OPERATIVE REPORT   PREOPERATIVE DIAGNOSIS:  Abscess, left ring finger.   POSTOPERATIVE DIAGNOSIS:  Abscess, left ring finger.   SURGEON:  Johnette Abraham, MD.   PROCEDURE:  I&D of abscess, left ring finger.   ANESTHESIA:  Local intrathecal block with 2% lidocaine without  epinephrine - total of 2 mL.   SPECIMENS:  None.   DISPOSITION OF SPECIMENS:  None.   INDICATIONS:  Mr. Tomas is a 50 year old male who presented to the ER  last week with a paronychia and this was drained.  He presented today  for followup and he has what appears to be an undrained abscess of left  ring finger.  Further I&D is necessary.  Risks, benefits, and  alternatives of this procedure were explained to the patient and he  agreed to proceed.  Consent was obtained.   PROCEDURE:  The fingers and hand were prepped with Betadine solution.  A  25-gauge needle was used intrathecally at the proximal phalanx volarly  in the midline through the tendon sheath and approximately 2 mL of 2%  lidocaine without epinephrine was infiltrated.  Good anesthesia to the  tip of the finger was obtained.  Following a 15 blade was used on the  ulnar side of the paronychium and up underneath the eponychial fold to  release any undrained abscess.  There was no gross purulence that was  obtained.  Gentle probing of this area and palpation did not reveal any  gross purulence or retained abscess pocket.  Therefore, the wound was  packed with a small 2 x 2 gauze and dressed in a sterile manner.  The  patient tolerated the procedure well and he will be continued on oral  antibiotics.  He will follow up in the clinic as  needed.      Johnette Abraham, MD  Electronically Signed     HCC/MEDQ  D:  04/27/2008  T:  04/28/2008  Job:  [id number]

## 2011-04-29 NOTE — Consult Note (Signed)
NAME:  Jerry Shaffer, Jerry Shaffer NO.:  [id number]   MEDICAL RECORD NO.:  [id number]          PATIENT TYPE:  EMS   LOCATION:  MAJO                         FACILITY:  [facility]   PHYSICIAN:  Johnette Abraham, MD    DATE OF BIRTH:  [birth year]   DATE OF CONSULTATION:  04/27/2008  DATE OF DISCHARGE:                                 CONSULTATION   CHIEF COMPLAINT:  Fingers infected.   HISTORY OF PRESENT ILLNESS:  This is a 50 year old gentleman who  presented to the emergency room last week with a swollen and painful  left ring finger.  At that time, the emergency department drained was  thought to be a paronychia.  He was placed the on an antibiotic  (Doxycyclime) - to follow up with a hand surgeon.  The patient was  unable to follow up with the hand surgeon that was on call, and  presented back to the emergency department today for followup.  His  complaint is that his left ring finger distal interphalangeal joint is  still red and swollen and the patient is concerned about additional  infection.  Past medical history and past surgical history were all  reviewed.   PAST MEDICAL HISTORY:  Significant for new-onset diabetes, bipolar  disorder, and history of asthma.   PAST SURGICAL HISTORY:  Back surgery and knee surgery.   ALLERGIES:  PENICILLIN.   SOCIAL HISTORY:  Previous alcohol and drug abuse.  Currently, nondrinker  or non-drug abuser but positive smoker.   PHYSICAL EXAMINATION:  He is in no acute distress.  His breathing in non-  labored.  His heart rate is regular.  Examination of the right hand is  within normal limits.  Examination of the left hand, the only noticeable  defect is some swelling and erythema of the dorsal aspect of the DIP  joint and skin along the ulnar side and paronychia of the left ring  finger.  There is questionable unreleased abscess here.  Neurovascularly, he is intact.   ASSESSMENT:  Abscess of the left ring finger.   PLAN:  Consent is  obtained and this abscess will be I&D'd here at the  bedside.  The patient will be continued on antibiotics (Bactrim).  He  will soak the finger in peroxide after the I&D is performed and if the  finger does not improve he should follow up in the office.      Johnette Abraham, MD  Electronically Signed     HCC/MEDQ  D:  04/27/2008  T:  04/28/2008  Job:  [id number]

## 2011-05-02 NOTE — Procedures (Signed)
[facility]  Patient:    Jerry Shaffer, Jerry Shaffer                      MRN: [id number] Proc. Date: 07/29/01 Adm. Date:  [id number] Attending:  Thyra Breed CC:         Kerrin Champagne, M.D.   Procedure Report  PROCEDURE:  Lumbar epidural steroid injection.  DIAGNOSIS:  Lumbar degenerative disk disease.  INTERVAL HISTORY:  The patient was initially evaluated by [place] back in March 2002. At that time, he was recommended for an alpha microcurrent stimulator. Instead of getting this, he was given a TENS unit which is not the same modality at all. He has finally returned to [place] for reevaluation. He states that he has back pain localized to his mid lower back which is exacerbated by bending forward any extent. He has some numbness and tingling into the right lower extremity intermittently. He has increased pain in his lower back with bowel movements but no incontinence of bowel or bladder. He denied weakness. He is currently taking Flexeril with Tylox for his pain and he notes this partially helps with his discomfort. He presents today requesting that we go ahead with epidural steroid injections since it has been so long and arduous to try and get the alpha microcurrent stimulator.  CURRENT MEDICATIONS:  Prozac, Adderall, Xanax, Tylox and Flexeril.  PHYSICAL EXAMINATION:  Blood pressure is 114/59, heart rate is 88, respiratory rate 16, O2 saturations 95%, and pain level is 4/10. Deep tendon reflexes were symmetric in the lower extremities with negative straight leg raise signs. He exhibits tenderness over his lower back with well healed surgical scar.  IMPRESSION: 1. Low back pain with history of work related injury status post    surgical interventions in the past. 2. Other medical problems per primary care physician.  DISPOSITION:  I discussed the potential risks, benefits and limitations of a lumbar epidural steroid injection in detail with the patient and he  wishes to proceed.  DESCRIPTION OF PROCEDURE:  After informed consent was obtained, the patient was placed in the sitting position and monitored. The patients back was prepped with Betadine x 3. A skin wheal was raised at the L4-5 interspace with 1 percent lidocaine. A 20 gauge Tuohy needle was introduced to the lumbar epidural space to loss of resistance to preservative free normal saline. There was no cerebrospinal fluid nor blood. The depth was 7.5 cm.  80 mg of Medrol mixed with 8 ml of preservative free normal saline was gently injected. The needle was flushed with preservative free normal saline and removed intact.  CONDITION POST PROCEDURE:  Stable.  DISCHARGE INSTRUCTIONS:  Resume previous diet. Limitations in activities per instruction sheet. Continue on current medications. Follow-up with me in one to two weeks for repeat injection. The patient was advised that he needs to go ahead and get his alpha microcurrent stimulator if approved through Workers Comp. We tried to contact Workers Comp. today to get his epidurals approved. He is under the understanding that they are already approved. DD:  07/29/01 TD:  07/29/01 Job: [id number]

## 2011-05-02 NOTE — H&P (Signed)
[facility]  Patient:    Jerry Shaffer, Jerry Shaffer                      MRN: [id number] Adm. Date:  [id number] Attending:  Thyra Breed CC:         Kerrin Champagne, M.D.  Workmans Compensation Carrier   History and Physical  NEW PATIENT EVALUATION  HISTORY:  The patient comes in for evaluation today from Dr. Kerrin Champagne for his low back pain.  The patient is sent by Dr. Otelia Sergeant for evaluation and recommendations.  He originally was sent for a possible block also.  The patient relates a history of back pain which began years ago and required surgical intervention by Dr. Otelia Sergeant back in 1997.  He responded positively to surgical intervention at that time.  His surgery consisted of bilateral laminectomies at L4-5 and on the right side, an L5-S1 diskectomy with lateral recess decompression to his right at L5-S1.  He apparently was in his usual state of health up until he fell at work this past September of 2001; he apparently slipped on a lemon wedge at work.  At that time, he noted a sharp pain which felt like somebody was hitting him in the back.  He noted that the pain was made worse when he tried to straighten up.  It radiated out into his right hip region.  There was no numbness or tingling, bowel or bladder incontinence, but he did have giveaway weakness.  He was treated with Tylox and an MRI was obtained.  The MRI was performed over at [facility] and according to Dr. Rogelia Mire notes, showed a small bulge at 4-5 and 5-S1.  He was placed on work restrictions and apparently nerve conduction studies were performed by Dr. Lillia Dallas. Bartko which were not diagnostic of a lumbar radiculopathy.  He was continued on Tylox and recommended for [place] to evaluate him.  In the interim, he has been seeing Dr. Mila Homer over at mental health for his bipolar personality disorder and she has given him some trials of alpha microcurrent stimulation which have significantly  reduced his pain for up to two weeks with one trial.  He is asking about going on this.  He currently describes his pain at about a level of 7/10, but he has taken no medications this morning.  He notes that if he takes a Tylox of Flexeril, it significantly reduces his pain. He has taken 30 Tylox in the past 21 days, which is really mild usage, and Flexeril he takes up 2 tablets per day.  He notes that his pain is made worse by lifting or bending forward and improved by rest, sometimes walking, the application of a TENS unit and most significantly, he has noted that alpha microcurrent stimulation helps him.  He has a history of recreational drug use including grass, cocaine and methamphetamines as well as alcohol, but he has been sober for 10 years.  He is actively followed by Dr. Ezzard Flax at mental health and is currently being treated with Prozac, Xanax and Adderall.  Other medical problems include asthmatic emphysema but he quit smoking 10 years ago.  He has been followed by Dr. Evette Georges for this.  He has also had recurrent migraines and has been treated nicely with Imitrex by Dr. Tawanna Cooler, with most recent one occurring about a week ago but none for quite some time prior to this.  Current medications include Prozac, Xanax, Adderall,  as well as his Tylox and Flexeril.  ALLERGIES:  Allergy to PENICILLIN characterized by anaphylaxis; he also has a strong allergy to BEES.  FAMILY HISTORY:  Family history is positive for coronary artery disease, diabetes, asthma, osteoarthritis and peptic ulcer disease.  PAST SURGICAL HISTORY:  Past surgical history is significant for hemorrhoidectomy, diskectomy and tonsillectomy.  ACTIVE MEDICAL PROBLEMS:  Emphysema, bipolar explosive rage personality disorder and a history of peptic ulcer disease.  SOCIAL HISTORY:  The patient does not smoke nor drink alcohol currently.  He sees Dr. Tawanna Cooler as his primary care physician.  He works as a  host at [employer].  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  REVIEW OF SYSTEMS:  GENERAL:  Significant for night sweats daily but nothing out of the ordinary recently.  He denied any fevers.  HEAD:  Significant for migraine headaches as mentioned above, otherwise, negative.  He does have a history of what he described as a closed head injury-type problem back in 1994.  EYES:  Significant for corrective lenses.  NOSE, MOUTH AND THROAT: Significant for sinus problems.  EARS:  Significant for childhood ear problems, none as an adult.  PULMONARY:  See active medical problems. CARDIOVASCULAR:  Negative.  GI:  Remote history of peptic ulcer disease.  GU: Negative.  MUSCULOSKELETAL:  See HPI.  The patient also has had problems with right hand and elbow problems and has had surgery on this by Dr. Nicki Reaper.  He also has recurrent knee problems and he describes his knees as shot.  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  CUTANEOUS:  Significant for rosacea.  NEUROLOGIC:  No history of seizure.  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  HEMATOLOGIC:  Negative. ENDOCRINE:  Negative.  PSYCHIATRIC:  See active medical problems. ALLERGY/IMMUNOLOGIC:  Positive for bee allergy.  PHYSICAL EXAMINATION:  VITAL SIGNS:  Blood pressure is 124/66, heart rate is 86, respiratory rate is 18, O2 saturation is 97% and temperature is 97.9 degrees.  Pain level is 7/10.  GENERAL:  This is a pleasant male in no acute distress.  HEENT:  Head was normocephalic, atraumatic.  He has rosacea over his face. Nose:  Patent nares.  Eyes:  Extraocular movements intact.  Oropharynx was free of lesions.  NECK:  Supple without lymphadenopathy.  Carotids are 2+ and symmetric without bruits.  LUNGS:  Clear.  HEART:  Regular rate and  rhythm.   ABDOMEN:  Obese.  GENITALIA:  Not performed.  RECTAL:  Not performed.  BACK:  Exam revealed increased pain on hyperextension to about 10 degrees, with intact forward flexion but pain on rising from forward flexion upward suggestive of a facet syndrome.  Straight leg raise signs are negative.  EXTREMITIES:  The patient has well-healed surgical scars over his right upper extremity.  Radial pulses and dorsalis pedis pulses were 2+ and symmetric.  NEUROLOGIC:  The patient is oriented x 4.  Cranial nerves II-XII are grossly intact.  Deep tendon reflexes were 2+ and symmetric in the upper extremities and at the knees.  Ankles were 1 to 2+ and symmetric, although the right was somewhat less than the left.  Motor was 5/5 with symmetric bulk and tone. Sensory was intact to pinprick and vibratory sense.  Coordination was grossly intact.  IMPRESSION: 1. Low back  pain syndrome with history of work-related injury exacerbating    this on August 30, 2000 with what appears to be more of a facet joint    syndrome with underlying degenerative disk disease on MRI, with negative    electromyograms.  He seems to have had a very positive response to alpha    microcurrent stimulation.  He is using minimal amounts of medications at    the present time.  He is not in an active exercise regimen for his back. 2. Other medical problems per Dr. Tawanna Cooler, his primary care physician, which    include early emphysema, history of peptic ulcer disease, history of    migraine headaches. 3. Bipolar personality disorder per Dr. Mila Homer.  DISPOSITION:  I advised the patient I did not feel a block was indicated at this time and advised him that he is really taking minimal medications for the time-being.  It sounds as though he has had a positive response to alpha microcurrent stimulation and I advised him that it would be wise to go ahead and see over a long-term basis whether this would be helpful for him.   I advised him since he is really using minimal amounts of medication, that this could be prescribed by Dr. Otelia Sergeant and did not necessarily need to be prescribed by a pain management physician.  We contacted his adjustor to find out what our defined role was in the patients care and it sounds like as far as we are able to glean, that we are to make recommendations back to Dr. Otelia Sergeant.  We also advocated that he get a trial of alpha microcurrent stimulation.  I have gone ahead and given him a list of books by ______ to see whether he will go ahead and get these so that we could work on building up his back muscles to an extent.  The patient seems very motivated to try and get off medications.  I will see the patient back at Dr. Rogelia Mire request if approved through workers compensation but at this time, do not see any particular treatment that he needs for the time-being that Dr. Otelia Sergeant cannot do for him.  DD:  03/03/01 TD:  03/03/01 Job: [id number]

## 2011-05-02 NOTE — H&P (Signed)
[facility]  Patient:    KIER, SMEAD Visit Number: [id number] MRN: [id number]          Service Type: PMG Location: [facility] Attending Physician:  Thyra Breed Adm. Date:  07/28/2001   CC:         Kerrin Champagne, M.D.   History and Physical  FOLLOW-UP EVALUATION  Granite comes in for follow-up evaluation of his lumbar degenerative disk disease.  Since his last evaluation he has got his _________ stimulator.  I spoke with the adjuster last week and advised him that I did not understand what the delay in getting the _________ stimulator had been, and that if he was only coming to see me to get an _________ stimulator that he could have gotten it from a physical therapist, let alone having Dr. Otelia Sergeant write the prescription.  We gave him an epidural steroid injection last week and the patient has noted that this has significantly improved his back discomfort.  He did not get his _________ stimulator until four days after this, and he continues to note improvement.  He continues on the same medications as previously.  PHYSICAL EXAMINATION:  VITAL SIGNS:  Blood pressure 112/66, heart rate is 111, respiratory rate 16, O2 saturations 95%.  EXTREMITIES/NEUROLOGIC:  He shows good healing from his previous injection site.  Deep tendon reflexes were unchanged from his last visit.  Straight leg raise signs are negative.  Gait is intact.  IMPRESSION:  Lumbar degenerative disk disease with work-related injury with improvement with epidural steroid injection and _________ stimulator.  DISPOSITION: 1. I advised the patient that he needed to follow up with Dr. Otelia Sergeant to get    further epidurals approved since he was approved for only one. 2. Continue with _________ stimulator. 3. I will see the patient back in follow-up if approved for further epidural    steroid injections. Attending Physician:  Thyra Breed DD:  08/05/01 TD:  08/05/01 Job:  [id number]

## 2011-05-26 ENCOUNTER — Ambulatory Visit (HOSPITAL_BASED_OUTPATIENT_CLINIC_OR_DEPARTMENT_OTHER): Payer: Self-pay | Attending: Family Medicine

## 2011-05-26 DIAGNOSIS — G4733 Obstructive sleep apnea (adult) (pediatric): Secondary | ICD-10-CM | POA: Insufficient documentation

## 2011-05-31 DIAGNOSIS — G4733 Obstructive sleep apnea (adult) (pediatric): Secondary | ICD-10-CM

## 2011-05-31 NOTE — Procedures (Signed)
NAME:  ULAS, ZUERCHER NO.:  [id number]  MEDICAL RECORD NO.:  [id number]          PATIENT TYPE:  OUT  LOCATION:  SLEEP CENTER                 FACILITY:  [facility]  PHYSICIAN:  Montavis Schubring D. Maple Hudson, MD, FCCP, FACPDATE OF BIRTH:  Feb 18[birth year] 1962  DATE OF STUDY:  05/26/2011                           NOCTURNAL POLYSOMNOGRAM  REFERRING PHYSICIAN:  BARBARA MCPHERSON  INDICATION FOR STUDY:  Hypersomnia with sleep apnea.  EPWORTH SLEEPINESS SCORE:  6/24, BMI 38, weight 250 pounds, height 68 inches, neck 20 inches.  MEDICATIONS:  Charted and reviewed.  SLEEP ARCHITECTURE:  Total sleep time 328 minutes with sleep efficiency 91%.  Stage I was 8.2%, stage II 80.3%, stage III absent, REM 11.4% of total sleep time.  Sleep latency 11 minutes, REM latency 91.5 minutes, awake after sleep onset 21.5 minutes, arousal index 10.6.  BEDTIME MEDICATION:  None.  RESPIRATORY DATA:  Apnea/hypopnea index (AHI) 15 per hour.  A total of 82 events was scored including 9 obstructive apneas and 73 hypopneas. Events were seen in all sleep positions, especially while supine and in REM.  REM AHI 40 per hour.  There were insufficient early events to permit initiation of CPAP titration by split protocol on the study night.  OXYGEN DATA:  Loud snoring with oxygen desaturation to a nadir of 85% and a mean oxygen saturation through the study of 93% on room air.  CARDIAC DATA:  Normal sinus rhythm.  MOVEMENT-PARASOMNIA:  No significant movement disturbance.  Bathroom x2.  IMPRESSIONS-RECOMMENDATIONS: 1. Mild-to-moderate obstructive sleep apnea/hypopnea syndrome,     apnea/hypopnea index 15 per hour.  Loud snoring with oxygen     desaturation to a nadir of 85% and a mean oxygen saturation through     the study of 93% on room air. 2. Events accumulated late enough in the study, initial time was not     available for application of continuous     positive airway pressure titration by split protocol  requirement.     Consider return for continuous positive airway pressure titration     or evaluate for alternative management as clinically indicated.     Maleak Brazzel D. Maple Hudson, MD, [facility], FACP Diplomate, Biomedical engineer of Sleep Medicine Electronically Signed    CDY/MEDQ  D:  05/31/2011 08:44:47  T:  05/31/2011 10:36:04  Job:  [id number]

## 2011-07-02 ENCOUNTER — Other Ambulatory Visit (HOSPITAL_COMMUNITY): Payer: Self-pay | Admitting: Family Medicine

## 2011-07-02 DIAGNOSIS — R51 Headache: Secondary | ICD-10-CM

## 2011-07-07 ENCOUNTER — Ambulatory Visit (HOSPITAL_COMMUNITY)
Admission: RE | Admit: 2011-07-07 | Discharge: 2011-07-07 | Disposition: A | Discharge status: Home / Self Care | Payer: Self-pay | Source: Ambulatory Visit | Attending: Family Medicine | Admitting: Family Medicine

## 2011-07-07 DIAGNOSIS — R413 Other amnesia: Secondary | ICD-10-CM | POA: Insufficient documentation

## 2011-07-07 DIAGNOSIS — R51 Headache: Secondary | ICD-10-CM

## 2011-07-07 DIAGNOSIS — H9209 Otalgia, unspecified ear: Secondary | ICD-10-CM | POA: Insufficient documentation

## 2011-07-07 MED ORDER — IOHEXOL 300 MG/ML  SOLN
65.0000 mL | Freq: Once | INTRAMUSCULAR | Status: AC | PRN
  Administered 2011-07-07: 65 mL via INTRAVENOUS

## 2011-07-21 ENCOUNTER — Emergency Department (HOSPITAL_COMMUNITY): Payer: Self-pay

## 2011-07-21 ENCOUNTER — Emergency Department (HOSPITAL_COMMUNITY)
Admission: EM | Admit: 2011-07-21 | Discharge: 2011-07-22 | Disposition: A | Discharge status: Home / Self Care | Payer: Self-pay | Attending: Emergency Medicine | Admitting: Emergency Medicine

## 2011-07-21 DIAGNOSIS — F319 Bipolar disorder, unspecified: Secondary | ICD-10-CM | POA: Insufficient documentation

## 2011-07-21 DIAGNOSIS — IMO0002 Reserved for concepts with insufficient information to code with codable children: Secondary | ICD-10-CM | POA: Insufficient documentation

## 2011-07-21 DIAGNOSIS — R55 Syncope and collapse: Secondary | ICD-10-CM | POA: Insufficient documentation

## 2011-07-21 DIAGNOSIS — M25519 Pain in unspecified shoulder: Secondary | ICD-10-CM | POA: Insufficient documentation

## 2011-07-21 DIAGNOSIS — E119 Type 2 diabetes mellitus without complications: Secondary | ICD-10-CM | POA: Insufficient documentation

## 2011-07-21 DIAGNOSIS — R209 Unspecified disturbances of skin sensation: Secondary | ICD-10-CM | POA: Insufficient documentation

## 2011-07-21 DIAGNOSIS — Y92009 Unspecified place in unspecified non-institutional (private) residence as the place of occurrence of the external cause: Secondary | ICD-10-CM | POA: Insufficient documentation

## 2011-07-21 DIAGNOSIS — W19XXXA Unspecified fall, initial encounter: Secondary | ICD-10-CM | POA: Insufficient documentation

## 2011-07-21 LAB — URINALYSIS, ROUTINE W REFLEX MICROSCOPIC
Bilirubin Urine: NEGATIVE
Hgb urine dipstick: NEGATIVE
Ketones, ur: NEGATIVE mg/dL
Leukocytes, UA: NEGATIVE
Protein, ur: NEGATIVE mg/dL

## 2011-07-21 LAB — CBC
HCT: 43.1 % (ref 39.0–52.0)
Hemoglobin: 14.2 g/dL (ref 13.0–17.0)
WBC: 14.8 10*3/uL — ABNORMAL HIGH (ref 4.0–10.5)

## 2011-07-21 LAB — CK TOTAL AND CKMB (NOT AT ARMC): Total CK: 104 U/L (ref 7–232)

## 2011-07-21 LAB — COMPREHENSIVE METABOLIC PANEL
ALT: 21 U/L (ref 0–53)
AST: 13 U/L (ref 0–37)
Albumin: 4.4 g/dL (ref 3.5–5.2)
BUN: 12 mg/dL (ref 6–23)
Chloride: 101 mEq/L (ref 96–112)
Creatinine, Ser: 0.67 mg/dL (ref 0.50–1.35)
Potassium: 3.8 mEq/L (ref 3.5–5.1)

## 2011-07-21 LAB — DIFFERENTIAL
Basophils Absolute: 0.1 10*3/uL (ref 0.0–0.1)
Basophils Relative: 0 % (ref 0–1)
Eosinophils Absolute: 0.1 10*3/uL (ref 0.0–0.7)
Lymphocytes Relative: 9 % — ABNORMAL LOW (ref 12–46)
Lymphs Abs: 1.3 10*3/uL (ref 0.7–4.0)
Monocytes Relative: 3 % (ref 3–12)
Neutro Abs: 12.8 10*3/uL — ABNORMAL HIGH (ref 1.7–7.7)
Neutrophils Relative %: 87 % — ABNORMAL HIGH (ref 43–77)

## 2011-07-21 LAB — POCT I-STAT TROPONIN I

## 2011-07-29 ENCOUNTER — Emergency Department (HOSPITAL_COMMUNITY)
Admission: EM | Admit: 2011-07-29 | Discharge: 2011-07-29 | Disposition: A | Discharge status: Home / Self Care | Payer: Self-pay | Attending: Emergency Medicine | Admitting: Emergency Medicine

## 2011-07-29 DIAGNOSIS — K219 Gastro-esophageal reflux disease without esophagitis: Secondary | ICD-10-CM | POA: Insufficient documentation

## 2011-07-29 DIAGNOSIS — E119 Type 2 diabetes mellitus without complications: Secondary | ICD-10-CM | POA: Insufficient documentation

## 2011-07-29 DIAGNOSIS — IMO0002 Reserved for concepts with insufficient information to code with codable children: Secondary | ICD-10-CM | POA: Insufficient documentation

## 2011-07-29 DIAGNOSIS — F319 Bipolar disorder, unspecified: Secondary | ICD-10-CM | POA: Insufficient documentation

## 2011-07-29 DIAGNOSIS — W1809XA Striking against other object with subsequent fall, initial encounter: Secondary | ICD-10-CM | POA: Insufficient documentation

## 2011-07-29 DIAGNOSIS — E7800 Pure hypercholesterolemia, unspecified: Secondary | ICD-10-CM | POA: Insufficient documentation

## 2011-08-05 ENCOUNTER — Ambulatory Visit (HOSPITAL_BASED_OUTPATIENT_CLINIC_OR_DEPARTMENT_OTHER): Payer: Self-pay | Attending: Family Medicine

## 2011-08-05 DIAGNOSIS — G4710 Hypersomnia, unspecified: Secondary | ICD-10-CM | POA: Insufficient documentation

## 2011-08-09 DIAGNOSIS — G4730 Sleep apnea, unspecified: Secondary | ICD-10-CM

## 2011-08-09 DIAGNOSIS — G4710 Hypersomnia, unspecified: Secondary | ICD-10-CM

## 2011-08-09 NOTE — Procedures (Signed)
NAME:  Jerry Shaffer, Jerry Shaffer NO.:  [id number]  MEDICAL RECORD NO.:  [id number]          PATIENT TYPE:  OUT  LOCATION:  SLEEP CENTER                 FACILITY:  [facility]  PHYSICIAN:  Kirin Brandenburger D. Maple Hudson, MD, FCCP, FACPDATE OF BIRTH:  20[birth year]Nov-1962  DATE OF STUDY:  08/05/2011                           NOCTURNAL POLYSOMNOGRAM  REFERRING PHYSICIAN:  Georganna Skeans, MD  INDICATION FOR STUDY:  Hypersomnia with sleep apnea.  EPWORTH SLEEPINESS SCORE:  Epworth sleepiness score 6/24, BMI 38. Weight 250 pounds, height 68 inches.  Neck 20 inches.  Home medications are charted and reviewed.  A baseline diagnostic NPSG on May 26, 2011 recorded an AHI of 15 per hour.  CPAP titration is requested.  MEDICATIONS:  SLEEP ARCHITECTURE:  Total sleep time 269.5 minutes with sleep efficiency 74.4%.  Stage I was 13.7%, stage II 83.1%, stage III absent, REM 3.2% of total sleep time.  Sleep latency 3.5 minutes, REM latency 108.5 minutes, awake after sleep onset 21 minutes, arousal index 9.4. Bedtime medication:  None.  RESPIRATORY DATA:  CPAP titration protocol.  CPAP was titrated to 11 CWP, AHI 0 per hour.  She wore a small ResMed Quattro FX full-face mask with heated humidifier and C-Flex setting of 3.  She complained of nasal congestion.  OXYGEN DATA:  Snoring was prevented by CPAP and mean oxygen saturation held 93.4% during titration.  CARDIAC DATA:  Normal sinus rhythm.  MOVEMENT-PARASOMNIA:  No significant movement disturbance.  Bathroom x1.  IMPRESSIONS-RECOMMENDATIONS: 1. Successful CPAP titration to 11 CWP, AHI 0 per hour.  She wore a     small ResMed Quattro FX full-face mask with heated humidifier, C-     Flex setting of 3.  She complained of nasal congestion. 2. Baseline diagnostic NPSG on May 26, 2011 had recorded an AHI of 15     per hour.    Sadie Pickar D. Maple Hudson, MD, [facility], FACP Diplomate, Biomedical engineer of Sleep Medicine Electronically Signed   CDY/MEDQ  D:  08/09/2011  11:55:46  T:  08/09/2011 16:14:01  Job:  [id number]

## 2011-10-10 ENCOUNTER — Emergency Department (HOSPITAL_COMMUNITY)
Admission: EM | Admit: 2011-10-10 | Discharge: 2011-10-11 | Disposition: A | Discharge status: Home / Self Care | Payer: Self-pay | Attending: Emergency Medicine | Admitting: Emergency Medicine

## 2011-10-10 DIAGNOSIS — E7800 Pure hypercholesterolemia, unspecified: Secondary | ICD-10-CM | POA: Insufficient documentation

## 2011-10-10 DIAGNOSIS — F319 Bipolar disorder, unspecified: Secondary | ICD-10-CM | POA: Insufficient documentation

## 2011-10-10 DIAGNOSIS — E119 Type 2 diabetes mellitus without complications: Secondary | ICD-10-CM | POA: Insufficient documentation

## 2011-10-10 DIAGNOSIS — R51 Headache: Secondary | ICD-10-CM | POA: Insufficient documentation

## 2011-10-10 DIAGNOSIS — K219 Gastro-esophageal reflux disease without esophagitis: Secondary | ICD-10-CM | POA: Insufficient documentation

## 2011-10-11 ENCOUNTER — Emergency Department (HOSPITAL_COMMUNITY): Payer: Self-pay

## 2011-10-11 LAB — CBC
Hemoglobin: 13.5 g/dL (ref 13.0–17.0)
MCH: 29.3 pg (ref 26.0–34.0)
MCHC: 33.3 g/dL (ref 30.0–36.0)
Platelets: 269 10*3/uL (ref 150–400)
RBC: 4.61 MIL/uL (ref 4.22–5.81)

## 2011-10-11 LAB — DIFFERENTIAL
Basophils Absolute: 0.1 10*3/uL (ref 0.0–0.1)
Basophils Relative: 1 % (ref 0–1)
Eosinophils Absolute: 0.5 10*3/uL (ref 0.0–0.7)
Neutro Abs: 8.7 10*3/uL — ABNORMAL HIGH (ref 1.7–7.7)
Neutrophils Relative %: 70 % (ref 43–77)

## 2011-10-11 LAB — POCT I-STAT, CHEM 8
Calcium, Ion: 1.19 mmol/L (ref 1.12–1.32)
HCT: 44 % (ref 39.0–52.0)
Hemoglobin: 15 g/dL (ref 13.0–17.0)
Sodium: 139 mEq/L (ref 135–145)
TCO2: 24 mmol/L (ref 0–100)

## 2011-10-11 LAB — GLUCOSE, CAPILLARY: Glucose-Capillary: 284 mg/dL — ABNORMAL HIGH (ref 70–99)

## 2011-10-25 ENCOUNTER — Emergency Department (HOSPITAL_COMMUNITY)
Admission: EM | Admit: 2011-10-25 | Discharge: 2011-10-25 | Disposition: A | Discharge status: Home / Self Care | Payer: Self-pay | Attending: Emergency Medicine | Admitting: Emergency Medicine

## 2011-10-25 ENCOUNTER — Encounter: Payer: Self-pay | Admitting: *Deleted

## 2011-10-25 DIAGNOSIS — R51 Headache: Secondary | ICD-10-CM | POA: Insufficient documentation

## 2011-10-25 DIAGNOSIS — R110 Nausea: Secondary | ICD-10-CM | POA: Insufficient documentation

## 2011-10-25 DIAGNOSIS — E119 Type 2 diabetes mellitus without complications: Secondary | ICD-10-CM | POA: Insufficient documentation

## 2011-10-25 DIAGNOSIS — Z8673 Personal history of transient ischemic attack (TIA), and cerebral infarction without residual deficits: Secondary | ICD-10-CM | POA: Insufficient documentation

## 2011-10-25 DIAGNOSIS — R000 Tachycardia, unspecified: Secondary | ICD-10-CM | POA: Insufficient documentation

## 2011-10-25 DIAGNOSIS — Z79899 Other long term (current) drug therapy: Secondary | ICD-10-CM | POA: Insufficient documentation

## 2011-10-25 HISTORY — DX: Transient cerebral ischemic attack, unspecified: G45.9

## 2011-10-25 LAB — POCT I-STAT, CHEM 8
Calcium, Ion: 1.1 mmol/L — ABNORMAL LOW (ref 1.12–1.32)
Chloride: 101 mEq/L (ref 96–112)
Glucose, Bld: 162 mg/dL — ABNORMAL HIGH (ref 70–99)
HCT: 46 % (ref 39.0–52.0)
Hemoglobin: 15.6 g/dL (ref 13.0–17.0)

## 2011-10-25 LAB — CBC
MCH: 28.1 pg (ref 26.0–34.0)
MCV: 87.5 fL (ref 78.0–100.0)
Platelets: 296 10*3/uL (ref 150–400)
RBC: 5.02 MIL/uL (ref 4.22–5.81)
RDW: 13.5 % (ref 11.5–15.5)
WBC: 15 10*3/uL — ABNORMAL HIGH (ref 4.0–10.5)

## 2011-10-25 LAB — GLUCOSE, CAPILLARY: Glucose-Capillary: 150 mg/dL — ABNORMAL HIGH (ref 70–99)

## 2011-10-25 MED ORDER — METOCLOPRAMIDE HCL 5 MG/ML IJ SOLN
10.0000 mg | Freq: Once | INTRAMUSCULAR | Status: AC
  Administered 2011-10-25: 10 mg via INTRAVENOUS
  Filled 2011-10-25: qty 2

## 2011-10-25 MED ORDER — SODIUM CHLORIDE 0.9 % IV SOLN: 10.0000 mg | Freq: Once | INTRAVENOUS | Status: DC

## 2011-10-25 MED ORDER — DEXAMETHASONE SODIUM PHOSPHATE 10 MG/ML IJ SOLN
10.0000 mg | Freq: Once | INTRAMUSCULAR | Status: AC
  Administered 2011-10-25: 10 mg via INTRAVENOUS
  Filled 2011-10-25: qty 1

## 2011-10-25 MED ORDER — SODIUM CHLORIDE 0.9 % IV BOLUS (SEPSIS)
1000.0000 mL | Freq: Once | INTRAVENOUS | Status: AC
  Administered 2011-10-25: 1000 mL via INTRAVENOUS

## 2011-10-25 MED ORDER — DIPHENHYDRAMINE HCL 50 MG/ML IJ SOLN
25.0000 mg | Freq: Once | INTRAMUSCULAR | Status: AC
  Administered 2011-10-25: 25 mg via INTRAVENOUS
  Filled 2011-10-25: qty 1

## 2011-10-25 MED ORDER — KETOROLAC TROMETHAMINE 30 MG/ML IJ SOLN
30.0000 mg | Freq: Once | INTRAMUSCULAR | Status: AC
  Administered 2011-10-25: 30 mg via INTRAVENOUS
  Filled 2011-10-25: qty 1

## 2011-10-25 NOTE — ED Provider Notes (Signed)
History     CSN: [id number] Arrival date & time: 10/25/2011  5:32 AM   First MD Initiated Contact with Patient 10/25/11 [phone]      Chief Complaint  Patient presents with  . Migraine  . Nausea    (Consider location/radiation/quality/duration/timing/severity/associated sxs/prior treatment) Patient is a 50 y.o. male presenting with migraine. The history is provided by the patient.  Migraine   patient here with headache x1 week. Seen for same a week ago, had CAT scan of the brain which was negative. History of chronic headaches associated with stress. Denies any neck pain, photophobia. Pain has been constant described as sharp in nature located at the top of his head. No emesis noted. Quality of the headache has not changed during the daytime. Is currently not taking any medications for his headache. No recent history of sinus complaints.  Past Medical History  Diagnosis Date  . Transient ischemic attack (TIA)   . Diabetes mellitus     Past Surgical History  Procedure Date  . Back surgery     History reviewed. No pertinent family history.  History  Substance Use Topics  . Smoking status: Former Games developer  . Smokeless tobacco: Not on file  . Alcohol Use: No      Review of Systems  All other systems reviewed and are negative.    Allergies  Penicillins  Home Medications   Current Outpatient Rx  Name Route Sig Dispense Refill  . ATORVASTATIN CALCIUM 20 MG PO TABS Oral Take 20 mg by mouth daily.     Marland Kitchen CLONAZEPAM 0.5 MG PO TABS Oral Take 0.5 mg by mouth 3 (three) times daily. 2 tabs in morning and 1 at night    . FAMOTIDINE 20 MG PO TABS Oral Take 40 mg by mouth once.     Marland Kitchen GLIPIZIDE 10 MG PO TABS Oral Take 5 mg by mouth once.     Marland Kitchen LAMOTRIGINE 25 MG PO TABS Oral Take 25 mg by mouth 4 (four) times daily.     Marland Kitchen METFORMIN HCL 500 MG PO TABS Oral Take 500 mg by mouth 2 (two) times daily with a meal.      . METHYLPHENIDATE HCL 20 MG PO TBCR Oral Take 20 mg by mouth 3  (three) times daily. 2 Tabs in Morning and 1 at mid-day    . RABEPRAZOLE SODIUM 20 MG PO TBEC Oral Take 20 mg by mouth daily.       BP 129/87  Pulse 117  Temp(Src) 99.1 F (37.3 C) (Oral)  Resp 20  SpO2 99%  Physical Exam  Nursing note and vitals reviewed. Constitutional: He is oriented to person, place, and time. Vital signs are normal. He appears well-developed and well-nourished.  Non-toxic appearance. No distress.  HENT:  Head: Normocephalic and atraumatic.  Eyes: Conjunctivae and EOM are normal. Pupils are equal, round, and reactive to light.  Neck: Normal range of motion. Neck supple. No tracheal deviation present.  Cardiovascular: Regular rhythm and normal heart sounds.  Tachycardia present.  Exam reveals no gallop.   No murmur heard. Pulmonary/Chest: Effort normal and breath sounds normal. No stridor. No respiratory distress. He has no wheezes.  Abdominal: Soft. Normal appearance and bowel sounds are normal. He exhibits no distension. There is no tenderness. There is no rebound.  Musculoskeletal: Normal range of motion. He exhibits no edema and no tenderness.  Neurological: He is alert and oriented to person, place, and time. He has normal strength. No cranial nerve deficit or sensory  deficit. GCS eye subscore is 4. GCS verbal subscore is 5. GCS motor subscore is 6.  Skin: Skin is warm and dry.  Psychiatric: He has a normal mood and affect. His speech is normal and behavior is normal.    ED Course  Procedures (including critical care time)  Labs Reviewed  CBC - Abnormal; Notable for the following:    WBC 15.0 (*)    All other components within normal limits  GLUCOSE, CAPILLARY - Abnormal; Notable for the following:    Glucose-Capillary 150 (*)    All other components within normal limits  POCT I-STAT, CHEM 8 - Abnormal; Notable for the following:    Glucose, Bld 162 (*)    Calcium, Ion 1.10 (*)    All other components within normal limits  I-STAT, CHEM 8   No  results found.   No diagnosis found.    MDM  Patient given pain medications and feels better. Repeat neuro exam stable         Toy Baker, MD 10/25/11 [phone]

## 2012-04-12 ENCOUNTER — Other Ambulatory Visit (HOSPITAL_COMMUNITY): Payer: Self-pay | Admitting: Family Medicine

## 2012-04-12 DIAGNOSIS — R519 Headache, unspecified: Secondary | ICD-10-CM

## 2012-04-18 ENCOUNTER — Observation Stay (HOSPITAL_COMMUNITY)
Admission: EM | Admit: 2012-04-18 | Discharge: 2012-04-20 | Disposition: A | Discharge status: Home / Self Care | Payer: Self-pay | Attending: Internal Medicine | Admitting: Internal Medicine

## 2012-04-18 ENCOUNTER — Emergency Department (HOSPITAL_COMMUNITY): Payer: Self-pay

## 2012-04-18 DIAGNOSIS — IMO0002 Reserved for concepts with insufficient information to code with codable children: Secondary | ICD-10-CM

## 2012-04-18 DIAGNOSIS — E119 Type 2 diabetes mellitus without complications: Secondary | ICD-10-CM | POA: Diagnosis present

## 2012-04-18 DIAGNOSIS — F909 Attention-deficit hyperactivity disorder, unspecified type: Secondary | ICD-10-CM | POA: Diagnosis present

## 2012-04-18 DIAGNOSIS — F319 Bipolar disorder, unspecified: Secondary | ICD-10-CM | POA: Insufficient documentation

## 2012-04-18 DIAGNOSIS — M25569 Pain in unspecified knee: Secondary | ICD-10-CM

## 2012-04-18 DIAGNOSIS — R200 Anesthesia of skin: Secondary | ICD-10-CM

## 2012-04-18 DIAGNOSIS — R209 Unspecified disturbances of skin sensation: Secondary | ICD-10-CM | POA: Insufficient documentation

## 2012-04-18 DIAGNOSIS — G459 Transient cerebral ischemic attack, unspecified: Principal | ICD-10-CM

## 2012-04-18 DIAGNOSIS — K219 Gastro-esophageal reflux disease without esophagitis: Secondary | ICD-10-CM

## 2012-04-18 DIAGNOSIS — J438 Other emphysema: Secondary | ICD-10-CM | POA: Insufficient documentation

## 2012-04-18 DIAGNOSIS — R079 Chest pain, unspecified: Secondary | ICD-10-CM

## 2012-04-18 DIAGNOSIS — R3129 Other microscopic hematuria: Secondary | ICD-10-CM

## 2012-04-18 DIAGNOSIS — R1032 Left lower quadrant pain: Secondary | ICD-10-CM

## 2012-04-18 DIAGNOSIS — R42 Dizziness and giddiness: Secondary | ICD-10-CM | POA: Insufficient documentation

## 2012-04-18 DIAGNOSIS — G43909 Migraine, unspecified, not intractable, without status migrainosus: Secondary | ICD-10-CM | POA: Diagnosis present

## 2012-04-18 DIAGNOSIS — K5730 Diverticulosis of large intestine without perforation or abscess without bleeding: Secondary | ICD-10-CM

## 2012-04-18 DIAGNOSIS — Z8673 Personal history of transient ischemic attack (TIA), and cerebral infarction without residual deficits: Secondary | ICD-10-CM | POA: Diagnosis present

## 2012-04-18 DIAGNOSIS — I10 Essential (primary) hypertension: Secondary | ICD-10-CM | POA: Diagnosis present

## 2012-04-18 DIAGNOSIS — E785 Hyperlipidemia, unspecified: Secondary | ICD-10-CM | POA: Insufficient documentation

## 2012-04-18 HISTORY — DX: Nausea with vomiting, unspecified: R11.2

## 2012-04-18 HISTORY — DX: Bipolar disorder, unspecified: F31.9

## 2012-04-18 HISTORY — DX: Mental disorder, not otherwise specified: F99

## 2012-04-18 HISTORY — DX: Shortness of breath: R06.02

## 2012-04-18 HISTORY — DX: Other specified postprocedural states: Z98.890

## 2012-04-18 HISTORY — DX: Hyperlipidemia, unspecified: E78.5

## 2012-04-18 LAB — COMPREHENSIVE METABOLIC PANEL
ALT: 22 U/L (ref 0–53)
AST: 16 U/L (ref 0–37)
Alkaline Phosphatase: 90 U/L (ref 39–117)
CO2: 25 mEq/L (ref 19–32)
GFR calc Af Amer: >90 mL/min (ref >90)
GFR calc non Af Amer: >90 mL/min (ref >90)
Glucose, Bld: 141 mg/dL — ABNORMAL HIGH (ref 70–99)
Potassium: 3.8 mEq/L (ref 3.5–5.1)
Sodium: 135 mEq/L (ref 135–145)
Total Protein: 6.4 g/dL (ref 6.0–8.3)

## 2012-04-18 LAB — CBC
HCT: 41.5 % (ref 39.0–52.0)
Hemoglobin: 13.5 g/dL (ref 13.0–17.0)
MCH: 28.7 pg (ref 26.0–34.0)
MCV: 88.1 fL (ref 78.0–100.0)
RBC: 4.71 MIL/uL (ref 4.22–5.81)
WBC: 13.5 10*3/uL — ABNORMAL HIGH (ref 4.0–10.5)

## 2012-04-18 LAB — POCT I-STAT TROPONIN I

## 2012-04-18 LAB — RAPID URINE DRUG SCREEN, HOSP PERFORMED: Opiates: NOT DETECTED

## 2012-04-18 NOTE — ED Notes (Signed)
[id number]<WG> Expected date:<BR> Expected time:<BR> Means of arrival:<BR> Comments:<BR> Hold for pt being checked in now w/ tia hx and present s/s.

## 2012-04-18 NOTE — ED Notes (Signed)
Pt c/o of left side face is numbness with tingling. Pt head is spinning when standing and walking. No LOC

## 2012-04-18 NOTE — ED Notes (Signed)
MD at bedside. 

## 2012-04-18 NOTE — ED Notes (Signed)
Pt requesting DNR papers to file with hospital and for self, papers at bedside

## 2012-04-19 ENCOUNTER — Observation Stay (HOSPITAL_COMMUNITY): Payer: Self-pay

## 2012-04-19 ENCOUNTER — Encounter (HOSPITAL_COMMUNITY): Payer: Self-pay | Admitting: Family Medicine

## 2012-04-19 DIAGNOSIS — E782 Mixed hyperlipidemia: Secondary | ICD-10-CM

## 2012-04-19 DIAGNOSIS — I10 Essential (primary) hypertension: Secondary | ICD-10-CM

## 2012-04-19 DIAGNOSIS — G459 Transient cerebral ischemic attack, unspecified: Secondary | ICD-10-CM

## 2012-04-19 DIAGNOSIS — E1165 Type 2 diabetes mellitus with hyperglycemia: Secondary | ICD-10-CM

## 2012-04-19 DIAGNOSIS — Z8673 Personal history of transient ischemic attack (TIA), and cerebral infarction without residual deficits: Secondary | ICD-10-CM | POA: Diagnosis present

## 2012-04-19 DIAGNOSIS — IMO0001 Reserved for inherently not codable concepts without codable children: Secondary | ICD-10-CM

## 2012-04-19 DIAGNOSIS — R209 Unspecified disturbances of skin sensation: Secondary | ICD-10-CM

## 2012-04-19 LAB — HEMOGLOBIN A1C
Hgb A1c MFr Bld: 7.8 % — ABNORMAL HIGH (ref <5.7)
Mean Plasma Glucose: 177 mg/dL — ABNORMAL HIGH (ref <117)

## 2012-04-19 LAB — LIPID PANEL
LDL Cholesterol: 33 mg/dL (ref 0–99)
Total CHOL/HDL Ratio: 4.9 RATIO

## 2012-04-19 LAB — GLUCOSE, CAPILLARY
Glucose-Capillary: 123 mg/dL — ABNORMAL HIGH (ref 70–99)
Glucose-Capillary: 118 mg/dL — ABNORMAL HIGH (ref 70–99)

## 2012-04-19 MED ORDER — LAMOTRIGINE 25 MG PO TABS
50.0000 mg | ORAL_TABLET | Freq: Two times a day (BID) | ORAL | Status: DC
  Administered 2012-04-19 – 2012-04-20 (×4): 50 mg via ORAL
  Filled 2012-04-19 (×7): qty 2

## 2012-04-19 MED ORDER — METFORMIN HCL 500 MG PO TABS
500.0000 mg | ORAL_TABLET | Freq: Two times a day (BID) | ORAL | Status: DC
  Administered 2012-04-19 (×2): 500 mg via ORAL
  Filled 2012-04-19 (×5): qty 1

## 2012-04-19 MED ORDER — METFORMIN HCL 500 MG PO TABS
500.0000 mg | ORAL_TABLET | Freq: Two times a day (BID) | ORAL | Status: DC
  Administered 2012-04-20: 500 mg via ORAL
  Filled 2012-04-19 (×2): qty 1

## 2012-04-19 MED ORDER — GLIPIZIDE 10 MG PO TABS
10.0000 mg | ORAL_TABLET | Freq: Once | ORAL | Status: AC
  Administered 2012-04-19: 10 mg via ORAL
  Filled 2012-04-19 (×2): qty 1

## 2012-04-19 MED ORDER — CLONAZEPAM 1 MG PO TABS
1.0000 mg | ORAL_TABLET | Freq: Every day | ORAL | Status: DC
  Administered 2012-04-19 – 2012-04-20 (×2): 1 mg via ORAL
  Filled 2012-04-19 (×2): qty 1

## 2012-04-19 MED ORDER — PANTOPRAZOLE SODIUM 40 MG PO TBEC
40.0000 mg | DELAYED_RELEASE_TABLET | Freq: Every day | ORAL | Status: DC
  Administered 2012-04-19 – 2012-04-20 (×2): 40 mg via ORAL
  Filled 2012-04-19: qty 1

## 2012-04-19 MED ORDER — CLONAZEPAM 0.5 MG PO TABS
0.5000 mg | ORAL_TABLET | Freq: Every day | ORAL | Status: DC
  Administered 2012-04-19: 0.5 mg via ORAL
  Filled 2012-04-19: qty 1

## 2012-04-19 MED ORDER — ACETAMINOPHEN 325 MG PO TABS: 650.0000 mg | ORAL_TABLET | ORAL | Status: DC | PRN

## 2012-04-19 MED ORDER — ONDANSETRON HCL 4 MG/2ML IJ SOLN: 4.0000 mg | INTRAMUSCULAR | Status: DC | PRN

## 2012-04-19 MED ORDER — FAMOTIDINE 40 MG PO TABS
40.0000 mg | ORAL_TABLET | Freq: Every day | ORAL | Status: DC
  Administered 2012-04-19 – 2012-04-20 (×2): 40 mg via ORAL
  Filled 2012-04-19 (×2): qty 1

## 2012-04-19 MED ORDER — ASPIRIN 325 MG PO TABS
325.0000 mg | ORAL_TABLET | Freq: Every day | ORAL | Status: DC
  Administered 2012-04-19 – 2012-04-20 (×2): 325 mg via ORAL
  Filled 2012-04-19 (×2): qty 1

## 2012-04-19 MED ORDER — SIMVASTATIN 20 MG PO TABS
20.0000 mg | ORAL_TABLET | Freq: Every day | ORAL | Status: DC
  Administered 2012-04-19 – 2012-04-20 (×2): 20 mg via ORAL
  Filled 2012-04-19 (×3): qty 1

## 2012-04-19 MED ORDER — INSULIN ASPART 100 UNIT/ML ~~LOC~~ SOLN: 0.0000 [IU] | Freq: Three times a day (TID) | SUBCUTANEOUS | Status: DC

## 2012-04-19 MED ORDER — INSULIN ASPART 100 UNIT/ML ~~LOC~~ SOLN: 0.0000 [IU] | Freq: Every day | SUBCUTANEOUS | Status: DC

## 2012-04-19 MED ORDER — GADOBENATE DIMEGLUMINE 529 MG/ML IV SOLN
20.0000 mL | Freq: Once | INTRAVENOUS | Status: AC
  Administered 2012-04-19: 20 mL via INTRAVENOUS

## 2012-04-19 MED ORDER — ENOXAPARIN SODIUM 40 MG/0.4ML ~~LOC~~ SOLN
40.0000 mg | SUBCUTANEOUS | Status: DC
  Filled 2012-04-19 (×3): qty 0.4

## 2012-04-19 NOTE — Care Management Note (Signed)
    Page 1 of 1   04/20/2012     1:16:02 PM   CARE MANAGEMENT NOTE 04/20/2012  Patient:  Jerry Shaffer, Jerry Shaffer   Account Number:  [id number]  Date Initiated:  04/19/2012  Documentation initiated by:  [facility]  Subjective/Objective Assessment:   Admitted with TIA     Action/Plan:   Anticipated DC Date:  04/20/2012   Anticipated DC Plan:  HOME/SELF CARE  In-house referral  Financial Counselor      DC Planning Services  CM consult  [facility]      Choice offered to / List presented to:             Status of service:  Completed, signed off Medicare Important Message given?   (If response is "NO", the following Medicare IM given date fields will be blank) Date Medicare IM given:   Date Additional Medicare IM given:    Discharge Disposition:  HOME/SELF CARE  Per UR Regulation:  Reviewed for med. necessity/level of care/duration of stay  If discussed at Long Length of Stay Meetings, dates discussed:    Comments:  PCP Dr. Clelia Croft  04/20/12 Called Dr. Marlis Edelson office and requested f/u appt per Dr. Brien Few. They will contact the patient at his home phone number and make an appt. Informed patient that Dr. Marlis Edelson office will call to make an appointment.  04/19/12 Made referral to financial counselor per pt request. Patient active at [facility], checked and he has appt on 05/31/12 at 2:45pm with Dr. Clelia Croft, unable to move the appt date up. Placed appt time in Epic. Will continue to follow for d/c needs.Jacquelynn Cree RN, BSN, CCM

## 2012-04-19 NOTE — ED Notes (Signed)
Made aware of NPO

## 2012-04-19 NOTE — ED Notes (Signed)
MD at bedside. 

## 2012-04-19 NOTE — Progress Notes (Signed)
Patient is scheduled to have lovenox and insulin this am , he refused these medications stating that he doesn't  Want any needles other than ivs. MD notified.

## 2012-04-19 NOTE — ED Provider Notes (Signed)
History     CSN: [id number]  Arrival date & time 04/18/12  2040   First MD Initiated Contact with Patient 04/18/12 2333      Chief Complaint  Patient presents with  . Dizziness    (Consider location/radiation/quality/duration/timing/severity/associated sxs/prior treatment) HPI History provided by patient. History of migraines and TIA in the past. Tonight developed some dizziness with left facial numbness. No weakness. No headaches. No difficulty with vision speech or gait. Moderate in severity. Symptoms ongoing for about an hour. No history of same. No fevers or chills. No neck stiffness. No recent illness. [facility] and [facility] scheduled to follow up with neurology at either [facility] or possibly [facility]. Has not seen a neurologist. Past Medical History  Diagnosis Date  . Transient ischemic attack (TIA)   . Diabetes mellitus   . Bipolar 1 disorder   . Migraine   . Dyslipidemia   . Emphysema     Past Surgical History  Procedure Date  . Lumbar disc surgery   . Hemorroidectomy   . Tonsillectomy and adenoidectomy     Family History  Problem Relation Age of Onset  . Diabetes type II    . Coronary artery disease    . Bipolar disorder      History  Substance Use Topics  . Smoking status: Former Games developer  . Smokeless tobacco: Not on file  . Alcohol Use: No      Review of Systems  Constitutional: Negative for fever and chills.  HENT: Negative for neck pain and neck stiffness.   Eyes: Negative for pain.  Respiratory: Negative for shortness of breath.   Cardiovascular: Negative for chest pain.  Gastrointestinal: Negative for abdominal pain.  Genitourinary: Negative for dysuria.  Musculoskeletal: Negative for back pain.  Skin: Negative for rash.  Neurological: Positive for numbness. Negative for syncope, weakness and headaches.  All other systems reviewed and are negative.    Allergies  Penicillins  Home Medications   Current  Outpatient Rx  Name Route Sig Dispense Refill  . ASPIRIN 81 MG PO TABS Oral Take 81 mg by mouth 2 (two) times daily.    . ATORVASTATIN CALCIUM 20 MG PO TABS Oral Take 20 mg by mouth daily.     Marland Kitchen NEPHROCAPS 1 MG PO CAPS Oral Take 1 capsule by mouth daily.    Marland Kitchen CLONAZEPAM 0.5 MG PO TABS Oral Take 0.5 mg by mouth 3 (three) times daily. 2 tabs in morning and 1 at night    . FAMOTIDINE 20 MG PO TABS Oral Take 40 mg by mouth daily.     Marland Kitchen GLIPIZIDE 10 MG PO TABS Oral Take 10 mg by mouth once.     Marland Kitchen LAMOTRIGINE 25 MG PO TABS Oral Take 50 mg by mouth 2 (two) times daily.     Marland Kitchen METFORMIN HCL 500 MG PO TABS Oral Take 500 mg by mouth 2 (two) times daily with a meal.      . METHYLPHENIDATE HCL 20 MG PO TBCR Oral Take 20 mg by mouth 3 (three) times daily. 2 Tabs in Morning and 1 at mid-day    . MULTI-VITAMIN/MINERALS PO TABS Oral Take 1 tablet by mouth daily.    Marland Kitchen RABEPRAZOLE SODIUM 20 MG PO TBEC Oral Take 20 mg by mouth daily.       BP 156/95  Pulse 100  Temp 98.9 F (37.2 C)  Resp 24  SpO2 99%  Physical Exam  Constitutional: He is oriented to  person, place, and time. He appears well-developed and well-nourished.  HENT:  Head: Normocephalic and atraumatic.  Eyes: Conjunctivae and EOM are normal. Pupils are equal, round, and reactive to light.  Neck: Trachea normal. Neck supple. No thyromegaly present.  Cardiovascular: Normal rate, regular rhythm, S1 normal, S2 normal and normal pulses.     No systolic murmur is present   No diastolic murmur is present  Pulses:      Radial pulses are 2+ on the right side, and 2+ on the left side.  Pulmonary/Chest: Effort normal and breath sounds normal. He has no wheezes. He has no rhonchi. He has no rales. He exhibits no tenderness.  Abdominal: Soft. Normal appearance and bowel sounds are normal. There is no tenderness. There is no CVA tenderness and negative Murphy's sign.  Musculoskeletal:       BLE:s Calves nontender, no cords or erythema, negative Homans  sign  Neurological: He is alert and oriented to person, place, and time. He has normal strength. He displays normal reflexes. No sensory deficit. He exhibits normal muscle tone. GCS eye subscore is 4. GCS verbal subscore is 5. GCS motor subscore is 6.       Decrease sensorium to light touch left face with motor intact and no cranial nerve deficits otherwise  Skin: Skin is warm and dry. No rash noted. He is not diaphoretic.  Psychiatric: His speech is normal.       Cooperative and appropriate    ED Course  Procedures (including critical care time)  Labs Reviewed  URINE RAPID DRUG SCREEN (HOSP PERFORMED) - Abnormal; Notable for the following:    Benzodiazepines POSITIVE (*)    All other components within normal limits  CBC - Abnormal; Notable for the following:    WBC 13.5 (*)    All other components within normal limits  COMPREHENSIVE METABOLIC PANEL - Abnormal; Notable for the following:    Glucose, Bld 141 (*)    Total Bilirubin 0.2 (*)    All other components within normal limits  APTT  POCT I-STAT TROPONIN I   Ct Head Wo Contrast  04/18/2012  *RADIOLOGY REPORT*  Clinical Data: Dizziness  CT HEAD WITHOUT CONTRAST  Technique:  Contiguous axial images were obtained from the base of the skull through the vertex without contrast.  Comparison: 10/11/2011  Findings: There is no evidence for acute hemorrhage, hydrocephalus, mass lesion, or abnormal extra-axial fluid collection.  No definite CT evidence for acute infarction.  The visualized paranasal sinuses and mastoid air cells are clear.  IMPRESSION: Stable.  No acute intracranial abnormality.  Original Report Authenticated By: ERIC A. MANSELL, M.D.    Workup as above. CT and labs reviewed.  Medicine consult obtained. Case discussed as above with Dr. Joneen Roach who agrees to evaluation and admission. MDM   Left facial numbness with history of migraines no headache, possible TIA. Plan medical admission.        Sunnie Nielsen,  MD 04/19/12 [phone]

## 2012-04-19 NOTE — Progress Notes (Signed)
Utilization review complete 

## 2012-04-19 NOTE — Progress Notes (Signed)
Bilateral carotid artery duplex completed.  Preliminary report is no evidence of significant ICA stenosis. 

## 2012-04-19 NOTE — Progress Notes (Signed)
Subjective:  reports that his sensation is coming back.   Objective: Weight change:  No intake or output data in the 24 hours ending 04/19/12 1042  Filed Vitals:   04/19/12 1009  BP: 115/66  Pulse: 90  Temp: 98.2 F (36.8 C)  Resp: 18   Pt is Alert AFebrile Comfortable CVS S1S2 , no MRG Lungs : clear Abdomen: soft NT ND BS+ Extremities: No cyanosis/clubbing or edema. Neuro: no motor deficits. Sensory deficits persists .   Lab Results: Results for orders placed during the hospital encounter of 04/18/12 (from the past 24 hour(s))  URINE RAPID DRUG SCREEN (HOSP PERFORMED)     Status: Abnormal   Collection Time   04/18/12  9:01 PM      Component Value Range   Opiates NONE DETECTED  NONE DETECTED    Cocaine NONE DETECTED  NONE DETECTED    Benzodiazepines POSITIVE (*) NONE DETECTED    Amphetamines NONE DETECTED  NONE DETECTED    Tetrahydrocannabinol NONE DETECTED  NONE DETECTED    Barbiturates NONE DETECTED  NONE DETECTED   CBC     Status: Abnormal   Collection Time   04/18/12  9:30 PM      Component Value Range   WBC 13.5 (*) 4.0 - 10.5 (K/uL)   RBC 4.71  4.22 - 5.81 (MIL/uL)   Hemoglobin 13.5  13.0 - 17.0 (g/dL)   HCT 16.1  09.6 - 04.5 (%)   MCV 88.1  78.0 - 100.0 (fL)   MCH 28.7  26.0 - 34.0 (pg)   MCHC 32.5  30.0 - 36.0 (g/dL)   RDW 40.9  81.1 - 91.4 (%)   Platelets 285  150 - 400 (K/uL)  COMPREHENSIVE METABOLIC PANEL     Status: Abnormal   Collection Time   04/18/12  9:30 PM      Component Value Range   Sodium 135  135 - 145 (mEq/L)   Potassium 3.8  3.5 - 5.1 (mEq/L)   Chloride 98  96 - 112 (mEq/L)   CO2 25  19 - 32 (mEq/L)   Glucose, Bld 141 (*) 70 - 99 (mg/dL)   BUN 16  6 - 23 (mg/dL)   Creatinine, Ser 7.82  0.50 - 1.35 (mg/dL)   Calcium 8.9  8.4 - 95.6 (mg/dL)   Total Protein 6.4  6.0 - 8.3 (g/dL)   Albumin 4.0  3.5 - 5.2 (g/dL)   AST 16  0 - 37 (U/L)   ALT 22  0 - 53 (U/L)   Alkaline Phosphatase 90  39 - 117 (U/L)   Total Bilirubin 0.2 (*) 0.3 - 1.2  (mg/dL)   GFR calc non Af Amer >90  >90 (mL/min)   GFR calc Af Amer >90  >90 (mL/min)  APTT     Status: Normal   Collection Time   04/18/12  9:30 PM      Component Value Range   aPTT 37  24 - 37 (seconds)  HEMOGLOBIN A1C     Status: Abnormal   Collection Time   04/18/12  9:30 PM      Component Value Range   Hemoglobin A1C 7.8 (*) <5.7 (%)   Mean Plasma Glucose 177 (*) <117 (mg/dL)  LIPID PANEL     Status: Abnormal   Collection Time   04/18/12  9:30 PM      Component Value Range   Cholesterol 122  0 - 200 (mg/dL)   Triglycerides 213 (*) <150 (mg/dL)   HDL 25 (*) >  39 (mg/dL)   Total CHOL/HDL Ratio 4.9     VLDL 64 (*) 0 - 40 (mg/dL)   LDL Cholesterol 33  0 - 99 (mg/dL)  POCT I-STAT TROPONIN I     Status: Normal   Collection Time   04/18/12  9:37 PM      Component Value Range   Troponin i, poc 0.00  0.00 - 0.08 (ng/mL)   Comment 3           GLUCOSE, CAPILLARY     Status: Abnormal   Collection Time   04/19/12  3:33 AM      Component Value Range   Glucose-Capillary 119 (*) 70 - 99 (mg/dL)  GLUCOSE, CAPILLARY     Status: Abnormal   Collection Time   04/19/12  8:25 AM      Component Value Range   Glucose-Capillary 166 (*) 70 - 99 (mg/dL)  GLUCOSE, CAPILLARY     Status: Abnormal   Collection Time   04/19/12 11:45 AM      Component Value Range   Glucose-Capillary 174 (*) 70 - 99 (mg/dL)  GLUCOSE, CAPILLARY     Status: Abnormal   Collection Time   04/19/12  4:21 PM      Component Value Range   Glucose-Capillary 123 (*) 70 - 99 (mg/dL)    Micro Results: No results found for this or any previous visit (from the past 240 hour(s)).  Studies/Results: Ct Head Wo Contrast  04/18/2012  *RADIOLOGY REPORT*  Clinical Data: Dizziness  CT HEAD WITHOUT CONTRAST  Technique:  Contiguous axial images were obtained from the base of the skull through the vertex without contrast.  Comparison: 10/11/2011  Findings: There is no evidence for acute hemorrhage, hydrocephalus, mass lesion, or abnormal extra-axial  fluid collection.  No definite CT evidence for acute infarction.  The visualized paranasal sinuses and mastoid air cells are clear.  IMPRESSION: Stable.  No acute intracranial abnormality.  Original Report Authenticated By: ERIC A. MANSELL, M.D.   Mr Laqueta Jean Wo Contrast  04/19/2012  *RADIOLOGY REPORT*  Clinical Data:  Dizziness and headache  MRI HEAD WITHOUT AND WITH CONTRAST MRA HEAD WITHOUT CONTRAST  Technique:  Multiplanar, multiecho pulse sequences of the brain and surrounding structures were obtained without and with intravenous contrast.  Angiographic images of the head were obtained using MRA technique without contrast.  Contrast: 20mL MULTIHANCE GADOBENATE DIMEGLUMINE 529 MG/ML IV SOLN  Comparison:  CT head 04/18/2012  MRI HEAD WITHOUT AND WITH CONTRAST  Findings:  3 mm focus of increased signal on diffusion weighted imaging in the right pontomedullary junction may represent artifact or possibly acute infarct.  This area does show decreased signal on ADC.  There is a similar although less apparent signal abnormality on the left.  Otherwise no acute infarct.  Negative for hemorrhage or mass lesion.  Ventricle size is normal.  Pituitary gland is normal in size.  Postcontrast imaging of the brain reveals normal enhancement.  No enhancing mass or abnormal meningeal enhancement is present.  Image quality degraded by motion particularly on the postcontrast images.  Chronic sinusitis with mucosal edema in the paranasal sinuses.  IMPRESSION: Possible area of acute infarct in the right pontomedullary junction.  Correlate with symptoms.  I would favor that this is artifact on diffusion weighted imaging.  Otherwise no acute abnormality.  MRA HEAD  Findings: Both vertebral arteries are patent to the basilar.  PICA is patent bilaterally.  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  Internal carotid artery is patent bilaterally without stenosis.  Anterior and middle cerebral arteries are patent.  No aneurysm.  Image quality degraded by patient motion.  IMPRESSION: Motion degraded study.  No large vessel occlusion.  Original Report Authenticated By: Camelia Phenes, M.D.   Mr Mra Head/brain Wo Cm  04/19/2012  *RADIOLOGY REPORT*  Clinical Data:  Dizziness and headache  MRI HEAD WITHOUT AND WITH CONTRAST MRA HEAD WITHOUT CONTRAST  Technique:  Multiplanar, multiecho pulse sequences of the brain and surrounding structures were obtained without and with intravenous contrast.  Angiographic images of the head were obtained using MRA technique without contrast.  Contrast: 20mL MULTIHANCE GADOBENATE DIMEGLUMINE 529 MG/ML IV SOLN  Comparison:  CT head 04/18/2012  MRI HEAD WITHOUT AND WITH CONTRAST  Findings:  3 mm focus of increased signal on diffusion weighted imaging in the right pontomedullary junction may represent artifact or possibly acute infarct.  This area does show decreased signal on ADC.  There is a similar although less apparent signal abnormality on the left.  Otherwise no acute infarct.  Negative for hemorrhage or mass lesion.  Ventricle size is normal.  Pituitary gland is normal in size.  Postcontrast imaging of the brain reveals normal enhancement.  No enhancing mass or abnormal meningeal enhancement is present.  Image quality degraded by motion particularly on the postcontrast images.  Chronic sinusitis with mucosal edema in the paranasal sinuses.  IMPRESSION: Possible area of acute infarct in the right pontomedullary junction.  Correlate with symptoms.  I would favor that this is artifact on diffusion weighted imaging.  Otherwise no acute abnormality.  MRA HEAD  Findings: Both vertebral arteries are patent to the basilar.  PICA is patent bilaterally.  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  Internal carotid artery is patent bilaterally without stenosis. Anterior and  middle cerebral arteries are patent.  No aneurysm.  Image quality degraded by patient motion.  IMPRESSION: Motion degraded study.  No large vessel occlusion.  Original Report Authenticated By: Camelia Phenes, M.D.   Medications: Scheduled Meds:   . aspirin  325 mg Oral Daily  . clonazePAM  0.5 mg Oral QHS  . clonazePAM  1 mg Oral Daily  . enoxaparin  40 mg Subcutaneous Q24H  . famotidine  40 mg Oral Daily  . gadobenate dimeglumine  20 mL Intravenous Once  . glipiZIDE  10 mg Oral Once  . insulin aspart  0-15 Units Subcutaneous TID WC  . insulin aspart  0-5 Units Subcutaneous QHS  . lamoTRIgine  50 mg Oral BID  . metFORMIN  500 mg Oral BID WC  . pantoprazole  40 mg Oral Q1200  . simvastatin  20 mg Oral Daily   Continuous Infusions:  PRN Meds:.acetaminophen, ondansetron (ZOFRAN) IV  Assessment/Plan: Patient Active Hospital Problem List:  1. TIA: Symptoms have resolved. 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 Currently he is on aspirin 325mg  daily. Neuro consult called. 2 d echo and carotid duplex pending.   Risk factor modification needed.   hgba1c  Is 7.8. Will need further reduction in HGba1c.  Continue with zocor.   2. Diabetes Mellitus:  CBG (last 3)   Basename 04/19/12 1621 04/19/12 1145 04/19/12 0825  GLUCAP 123* 174* 166*    Continue with metformin and glipizide.   3. Hyperlipidemia: -continue with zocor.   DVT Prophylaxis.   LOS:  1 day   Mennie Spiller 04/19/2012, 10:42 AM

## 2012-04-19 NOTE — Progress Notes (Signed)
*  PRELIMINARY RESULTS* Echocardiogram 2D Echocardiogram has been performed.  Glean Salen [facility] 04/19/2012, 11:29 AM

## 2012-04-19 NOTE — H&P (Signed)
PCP:   [facility]   Chief Complaint:  Tingling and numbness left side of face  HPI: This is a 51 year old gentleman who states he's been having dizziness since this afternoon. He additionally states he developed tingling on the left side of the face for the past 2 days. While here in the ER, he states he developed numbness on the left side of the face. He denies any localized weakness but he states a few days back after  fishing he developed a staggering gait, he was told he looked as though he was drunk. The patient denies drinking or drugs. He denies any localized weakness. He states he may have had some mild slurred speech. He does report some tinnitus and blurred vision but this occurs intermittently with him. He denies any altered mentation. The patient does have a history of migraine with neurological symptoms, which includes a left-facial numbness, however, he states this is different. He currently denies having a migraine, however, he does state he has a funny feeling in the left side of his temporal region but states it is not a headache. The patient was concerned and came to the ER. The left sided facial numbness persists. History provided by the patient is alert and oriented.  Review of Systems: Positives bolded   anorexia, fever, weight loss,, vision loss, decreased hearing, hoarseness, chest pain, syncope, dyspnea on exertion, peripheral edema, balance deficits, hemoptysis, abdominal pain, melena, hematochezia, severe indigestion/heartburn, hematuria, incontinence, genital sores, muscle weakness, suspicious skin lesions, transient blindness, difficulty walking, depression, unusual weight change, abnormal bleeding, enlarged lymph nodes, angioedema, and breast masses.  Past Medical History: Past Medical History  Diagnosis Date  . Transient ischemic attack (TIA)   . Diabetes mellitus   . Bipolar 1 disorder   . Migraine   . Dyslipidemia   . Emphysema    Past Surgical History    Procedure Date  . Lumbar disc surgery   . Hemorroidectomy   . Tonsillectomy and adenoidectomy     Medications: Prior to Admission medications   Medication Sig Start Date End Date Taking? Authorizing Provider  aspirin 81 MG tablet Take 81 mg by mouth 2 (two) times daily.   Yes Historical Provider, MD  atorvastatin (LIPITOR) 20 MG tablet Take 20 mg by mouth daily.    Yes Historical Provider, MD  b complex-C-folic acid 1 MG capsule Take 1 capsule by mouth daily.   Yes Historical Provider, MD  clonazePAM (KLONOPIN) 0.5 MG tablet Take 0.5 mg by mouth 3 (three) times daily. 2 tabs in morning and 1 at night   Yes Historical Provider, MD  famotidine (PEPCID) 20 MG tablet Take 40 mg by mouth daily.    Yes Historical Provider, MD  glipiZIDE (GLUCOTROL) 10 MG tablet Take 10 mg by mouth once.    Yes Historical Provider, MD  lamoTRIgine (LAMICTAL) 25 MG tablet Take 50 mg by mouth 2 (two) times daily.    Yes Historical Provider, MD  metFORMIN (GLUCOPHAGE) 500 MG tablet Take 500 mg by mouth 2 (two) times daily with a meal.     Yes Historical Provider, MD  methylphenidate (METADATE ER) 20 MG ER tablet Take 20 mg by mouth 3 (three) times daily. 2 Tabs in Morning and 1 at mid-day   Yes Historical Provider, MD  Multiple Vitamins-Minerals (MULTIVITAMIN WITH MINERALS) tablet Take 1 tablet by mouth daily.   Yes Historical Provider, MD  RABEprazole (ACIPHEX) 20 MG tablet Take 20 mg by mouth daily.    Yes Historical Provider, MD  Allergies:   Allergies  Allergen Reactions  . Penicillins     REACTION: anaphylaxis    Social History:  reports that he has quit smoking. He does not have any smokeless tobacco history on file. He reports that he does not drink alcohol or use illicit drugs.  Family History: Family History  Problem Relation Age of Onset  . Diabetes type II    . Coronary artery disease    . Bipolar disorder      Physical Exam: Filed Vitals:   04/18/12 2245 04/19/12 0000 04/19/12 0015  04/19/12 0030  BP:  149/87  156/95  Pulse:  98 105 100  Temp:      Resp: 24 23 24    SpO2:  95% 97% 99%    General:  Alert and oriented times three, well developed and nourished, no acute distress Eyes: PERRLA, pink conjunctiva, no scleral icterus ENT: Moist oral mucosa, neck supple, no thyromegaly Lungs: clear to ascultation, no wheeze, no crackles, no use of accessory muscles Cardiovascular: regular rate and rhythm, no regurgitation, no gallops, no murmurs. No carotid bruits, no JVD Abdomen: soft, positive BS, non-tender, non-distended, no organomegaly, not an acute abdomen GU: not examined Neuro: CN II - XII grossly intact except for numbness with decreased sensation persisting on left side of face, sensation intact Musculoskeletal: strength 5/5 all extremities, no clubbing, cyanosis or edema Skin: no rash, no subcutaneous crepitation, no decubitus Psych: appropriate patient   Labs on Admission:   [facility] 04/18/12 2130  NA 135  K 3.8  CL 98  CO2 25  GLUCOSE 141*  BUN 16  CREATININE 0.72  CALCIUM 8.9  MG --  PHOS --    Basename 04/18/12 2130  AST 16  ALT 22  ALKPHOS 90  BILITOT 0.2*  PROT 6.4  ALBUMIN 4.0   No results found for this basename: LIPASE:2,AMYLASE:2 in the last 72 hours  Basename 04/18/12 2130  WBC 13.5*  NEUTROABS --  HGB 13.5  HCT 41.5  MCV 88.1  PLT 285   No results found for this basename: CKTOTAL:3,CKMB:3,CKMBINDEX:3,TROPONINI:3 in the last 72 hours No components found with this basename: POCBNP:3 No results found for this basename: DDIMER:2 in the last 72 hours No results found for this basename: HGBA1C:2 in the last 72 hours No results found for this basename: CHOL:2,HDL:2,LDLCALC:2,TRIG:2,CHOLHDL:2,LDLDIRECT:2 in the last 72 hours No results found for this basename: TSH,T4TOTAL,FREET3,T3FREE,THYROIDAB in the last 72 hours No results found for this basename: VITAMINB12:2,FOLATE:2,FERRITIN:2,TIBC:2,IRON:2,RETICCTPCT:2 in the last 72  hours  Micro Results: No results found for this or any previous visit (from the past 240 hour(s)). Results for BENFORD, ASCH (MRN [id number]) as of 04/19/2012 01:03  Ref. Range 04/18/2012 21:01  AMPHETAMINES Latest Range: NONE DETECTED  NONE DETECTED  Barbiturates Latest Range: NONE DETECTED  NONE DETECTED  Benzodiazepines Latest Range: NONE DETECTED  POSITIVE (A)  Opiates Latest Range: NONE DETECTED  NONE DETECTED  COCAINE Latest Range: NONE DETECTED  NONE DETECTED  Tetrahydrocannabinol Latest Range: NONE DETECTED  NONE DETECTED    Radiological Exams on Admission: Ct Head Wo Contrast  04/18/2012  *RADIOLOGY REPORT*  Clinical Data: Dizziness  CT HEAD WITHOUT CONTRAST  Technique:  Contiguous axial images were obtained from the base of the skull through the vertex without contrast.  Comparison: 10/11/2011  Findings: There is no evidence for acute hemorrhage, hydrocephalus, mass lesion, or abnormal extra-axial fluid collection.  No definite CT evidence for acute infarction.  The visualized paranasal sinuses and mastoid air cells are clear.  IMPRESSION: Stable.  No acute intracranial abnormality.  Original Report Authenticated By: ERIC A. MANSELL, M.D.    EKG: Normal sinus rhythm  Assessment/Plan Present on Admission:  .TIA (transient ischemic attack) Admit to telemetry 3 neuro, patient will be transferred to [facility] cone Aspirin daily TIA protocol with MRI in a.m. Neurology consult per a.m. team  Patient with history of TIA symptoms with migraines however, patient denies migraine headaches at this time  .DIABETES MELLITUS, CONTROLLED .ATTENTION DEFICIT HYPERACTIVITY DISORDER, ADULT .HYPERTENSION NEC .MIGRAINE HEADACHE Stable resume home medications  DO NOT RESUSCITATE DVT prophylaxis Team 5/Dr. Enos Fling, Gedalya Jim 04/19/2012, 1:02 AM

## 2012-04-19 NOTE — Consult Note (Signed)
[facility] CONSULT NOTE     Reason for Consult: left facial numbness    HPI:    Jerry Shaffer is an 51 y.o. male who noted over the past few days he has felt light headed.  Yesterday at around 5 pm he was having left facial numbness which encompassed the left V2-V3 region.  He states the numbness continued from 5 pm 04/18/12 to this am around 7 am 04/19/12. He now has no symptoms.  He takes a aspirin 81 mg X 2 daily.   No other symptoms were noted.   Past Medical History  Diagnosis Date  . Transient ischemic attack (TIA)   . Diabetes mellitus   . Bipolar 1 disorder   . Migraine   . Dyslipidemia   . Emphysema     Past Surgical History  Procedure Date  . Lumbar disc surgery   . Hemorroidectomy   . Tonsillectomy and adenoidectomy     Family History  Problem Relation Age of Onset  . Diabetes type II    . Coronary artery disease    . Bipolar disorder      Social History:  reports that he has quit smoking. He does not have any smokeless tobacco history on file. He reports that he does not drink alcohol or use illicit drugs.  Allergies  Allergen Reactions  . Penicillins     REACTION: anaphylaxis    Medications:    Prior to Admission:  Prescriptions prior to admission  Medication Sig Dispense Refill  . aspirin 81 MG tablet Take 81 mg by mouth 2 (two) times daily.      Marland Kitchen atorvastatin (LIPITOR) 20 MG tablet Take 20 mg by mouth daily.       Marland Kitchen b complex-C-folic acid 1 MG capsule Take 1 capsule by mouth daily.      . clonazePAM (KLONOPIN) 0.5 MG tablet Take 0.5 mg by mouth 3 (three) times daily. 2 tabs in morning and 1 at night      . famotidine (PEPCID) 20 MG tablet Take 40 mg by mouth daily.       Marland Kitchen glipiZIDE (GLUCOTROL) 10 MG tablet Take 10 mg by mouth once.       . lamoTRIgine (LAMICTAL) 25 MG tablet Take 50 mg by mouth 2 (two) times daily.       . metFORMIN (GLUCOPHAGE) 500 MG tablet Take 500 mg by mouth 2 (two) times daily with a meal.         . methylphenidate (METADATE ER) 20 MG ER tablet Take 20 mg by mouth 3 (three) times daily. 2 Tabs in Morning and 1 at mid-day      . Multiple Vitamins-Minerals (MULTIVITAMIN WITH MINERALS) tablet Take 1 tablet by mouth daily.      . RABEprazole (ACIPHEX) 20 MG tablet Take 20 mg by mouth daily.        Scheduled:   . aspirin  325 mg Oral Daily  . clonazePAM  0.5 mg Oral QHS  . clonazePAM  1 mg Oral Daily  . enoxaparin  40 mg Subcutaneous Q24H  . famotidine  40 mg Oral Daily  . gadobenate dimeglumine  20 mL Intravenous Once  . glipiZIDE  10 mg Oral Once  . insulin aspart  0-15 Units Subcutaneous TID WC  . insulin aspart  0-5 Units Subcutaneous QHS  . lamoTRIgine  50 mg Oral  BID  . metFORMIN  500 mg Oral BID WC  . pantoprazole  40 mg Oral Q1200  . simvastatin  20 mg Oral Daily    Review of Systems - General ROS: negative for - chills, fatigue, fever or hot flashes Hematological and Lymphatic ROS: negative for - bruising, fatigue, jaundice or pallor Endocrine ROS: negative for - hair pattern changes, hot flashes, mood swings or skin changes Respiratory ROS: negative for - cough, hemoptysis, orthopnea or wheezing Cardiovascular ROS: negative for - dyspnea on exertion, orthopnea, palpitations or shortness of breath Gastrointestinal ROS: negative for - abdominal pain, appetite loss, blood in stools, diarrhea or hematemesis Musculoskeletal ROS: positive for - joint pain, joint stiffness, joint swelling or muscle pain Neurological ROS: positive for - left face numbness/tingling Dermatological ROS: negative for dry skin, pruritus and rash   Blood pressure 92/57, pulse 84, temperature 98.4 F (36.9 C), temperature source Oral, resp. rate 18, height 5\' 8"  (1.727 m), weight 118.842 kg (262 lb), SpO2 96.00%.   Neurologic Examination:   Mental Status: Alert, oriented, thought content appropriate.  Speech fluent without evidence of aphasia. Able to follow 3 step commands without  difficulty. Cranial Nerves: II-Visual fields grossly intact. III/IV/VI-Extraocular movements intact.  Pupils reactive bilaterally. V/VII-Smile symmetric VIII-grossly intact IX/X-normal gag XI-bilateral shoulder shrug XII-midline tongue extension Motor: 5/5 bilaterally with normal tone and bulk with exception of left shoulder which has limited ROM due to old dislocation.  Sensory: Pinprick and light touch intact throughout, bilaterally Deep Tendon Reflexes: 2+ and symmetric throughout Plantars: Downgoing bilaterally Cerebellar: Normal finger-to-nose, normal rapid alternating movements and normal heel-to-shin test.    Lab Results  Component Value Date/Time   CHOL 122 04/18/2012  9:30 PM    Results for orders placed during the hospital encounter of 04/18/12 (from the past 48 hour(s))  URINE RAPID DRUG SCREEN (HOSP PERFORMED)     Status: Abnormal   Collection Time   04/18/12  9:01 PM      Component Value Range Comment   Opiates NONE DETECTED  NONE DETECTED     Cocaine NONE DETECTED  NONE DETECTED     Benzodiazepines POSITIVE (*) NONE DETECTED     Amphetamines NONE DETECTED  NONE DETECTED     Tetrahydrocannabinol NONE DETECTED  NONE DETECTED     Barbiturates NONE DETECTED  NONE DETECTED    CBC     Status: Abnormal   Collection Time   04/18/12  9:30 PM      Component Value Range Comment   WBC 13.5 (*) 4.0 - 10.5 (K/uL)    RBC 4.71  4.22 - 5.81 (MIL/uL)    Hemoglobin 13.5  13.0 - 17.0 (g/dL)    HCT 30.8  65.7 - 84.6 (%)    MCV 88.1  78.0 - 100.0 (fL)    MCH 28.7  26.0 - 34.0 (pg)    MCHC 32.5  30.0 - 36.0 (g/dL)    RDW 96.2  95.2 - 84.1 (%)    Platelets 285  150 - 400 (K/uL)   COMPREHENSIVE METABOLIC PANEL     Status: Abnormal   Collection Time   04/18/12  9:30 PM      Component Value Range Comment   Sodium 135  135 - 145 (mEq/L)    Potassium 3.8  3.5 - 5.1 (mEq/L)    Chloride 98  96 - 112 (mEq/L)    CO2 25  19 - 32 (mEq/L)    Glucose, Bld 141 (*) 70 - 99 (mg/dL)  BUN 16  6 -  23 (mg/dL)    Creatinine, Ser 1.61  0.50 - 1.35 (mg/dL)    Calcium 8.9  8.4 - 10.5 (mg/dL)    Total Protein 6.4  6.0 - 8.3 (g/dL)    Albumin 4.0  3.5 - 5.2 (g/dL)    AST 16  0 - 37 (U/L)    ALT 22  0 - 53 (U/L)    Alkaline Phosphatase 90  39 - 117 (U/L)    Total Bilirubin 0.2 (*) 0.3 - 1.2 (mg/dL)    GFR calc non Af Amer >90  >90 (mL/min)    GFR calc Af Amer >90  >90 (mL/min)   APTT     Status: Normal   Collection Time   04/18/12  9:30 PM      Component Value Range Comment   aPTT 37  24 - 37 (seconds)   HEMOGLOBIN A1C     Status: Abnormal   Collection Time   04/18/12  9:30 PM      Component Value Range Comment   Hemoglobin A1C 7.8 (*) <5.7 (%)    Mean Plasma Glucose 177 (*) <117 (mg/dL)   LIPID PANEL     Status: Abnormal   Collection Time   04/18/12  9:30 PM      Component Value Range Comment   Cholesterol 122  0 - 200 (mg/dL)    Triglycerides 096 (*) <150 (mg/dL)    HDL 25 (*) >04 (mg/dL)    Total CHOL/HDL Ratio 4.9      VLDL 64 (*) 0 - 40 (mg/dL)    LDL Cholesterol 33  0 - 99 (mg/dL)   POCT I-STAT TROPONIN I     Status: Normal   Collection Time   04/18/12  9:37 PM      Component Value Range Comment   Troponin i, poc 0.00  0.00 - 0.08 (ng/mL)    Comment 3            GLUCOSE, CAPILLARY     Status: Abnormal   Collection Time   04/19/12  3:33 AM      Component Value Range Comment   Glucose-Capillary 119 (*) 70 - 99 (mg/dL)   GLUCOSE, CAPILLARY     Status: Abnormal   Collection Time   04/19/12  8:25 AM      Component Value Range Comment   Glucose-Capillary 166 (*) 70 - 99 (mg/dL)   GLUCOSE, CAPILLARY     Status: Abnormal   Collection Time   04/19/12 11:45 AM      Component Value Range Comment   Glucose-Capillary 174 (*) 70 - 99 (mg/dL)     Ct Head Wo Contrast  04/18/2012  *RADIOLOGY REPORT*  Clinical Data: Dizziness  CT HEAD WITHOUT CONTRAST  Technique:  Contiguous axial images were obtained from the base of the skull through the vertex without contrast.  Comparison: 10/11/2011   Findings: There is no evidence for acute hemorrhage, hydrocephalus, mass lesion, or abnormal extra-axial fluid collection.  No definite CT evidence for acute infarction.  The visualized paranasal sinuses and mastoid air cells are clear.  IMPRESSION: Stable.  No acute intracranial abnormality.  Original Report Authenticated By: ERIC A. MANSELL, M.D.     Mr Maxine Glenn Head/brain Wo Cm  04/19/2012  *RADIOLOGY REPORT*  Clinical Data:  Dizziness and headache  MRI HEAD WITHOUT AND WITH CONTRAST MRA HEAD WITHOUT CONTRAST  Technique:  Multiplanar, multiecho pulse sequences of the brain and surrounding structures were obtained without and with  intravenous contrast.  Angiographic images of the head were obtained using MRA technique without contrast.  Contrast: 20mL MULTIHANCE GADOBENATE DIMEGLUMINE 529 MG/ML IV SOLN  Comparison:  CT head 04/18/2012  MRI HEAD WITHOUT AND WITH CONTRAST  Findings:  3 mm focus of increased signal on diffusion weighted imaging in the right pontomedullary junction may represent artifact or possibly acute infarct.  This area does show decreased signal on ADC.  There is a similar although less apparent signal abnormality on the left.  Otherwise no acute infarct.  Negative for hemorrhage or mass lesion.  Ventricle size is normal.  Pituitary gland is normal in size.  Postcontrast imaging of the brain reveals normal enhancement.  No enhancing mass or abnormal meningeal enhancement is present.  Image quality degraded by motion particularly on the postcontrast images.  Chronic sinusitis with mucosal edema in the paranasal sinuses.  IMPRESSION: Possible area of acute infarct in the right pontomedullary junction.  Correlate with symptoms.  I would favor that this is artifact on diffusion weighted imaging.  Otherwise no acute abnormality.  MRA HEAD  Findings: Both vertebral arteries are patent to the basilar.  PICA is patent bilaterally.  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  Internal carotid artery is patent bilaterally without stenosis. Anterior and middle cerebral arteries are patent.  No aneurysm.  Image quality degraded by patient motion.  IMPRESSION: Motion degraded study.  No large vessel occlusion.  Original Report Authenticated By: Camelia Phenes, M.D.   Carotid Doppler:  Bilateral carotid artery duplex completed. Preliminary report is no evidence of significant ICA stenosis.    2D Echo:  The cavity size was normal. There was mild focal basal hypertrophy of the septum. Systolic function was vigorous. The estimated ejection fraction was in the range of 65% to 70%. Wall motion was normal;   HBA1C--7.8  LDL 33  Assessment/Plan:   51 YO male with 3-4 day history of feeling light headed.  On 5/5 he noted left face numbness along the V2-V3 region. This lasted for less than 24 hours and now has completely cleared.  He does have a questionable acute right pontomedullary ischemia seen on both DWI and ADC but not on flair and equivocal on ADC.  Clinically, the symptoms were transient.  He is currently on ASA  81 mg (2 tabs) daily.Overall, would call the event a TIA (not permanent ischemia proven).  Doubt migraine phenomena as he denied any headache.    Recommend: 1) would increase to Plavix 75 mg daily; however patient states he cannot afford the medication--even if provided by [facility]. If this is true would recommend keeping patient on ASA 2) Continue Lipitor 3) HBA1C 7.8 and would like to see it below 5.4 4)  Triglyceride improvement if possible   I have discussed patient with Dr. Bettey Costa and she has seen the patient and agrees with the above mentioned.    Felicie Morn PA-C [facility] [phone]  04/19/2012, 3:11 PM

## 2012-04-20 DIAGNOSIS — G459 Transient cerebral ischemic attack, unspecified: Secondary | ICD-10-CM

## 2012-04-20 DIAGNOSIS — E782 Mixed hyperlipidemia: Secondary | ICD-10-CM

## 2012-04-20 DIAGNOSIS — IMO0001 Reserved for inherently not codable concepts without codable children: Secondary | ICD-10-CM

## 2012-04-20 DIAGNOSIS — E1165 Type 2 diabetes mellitus with hyperglycemia: Secondary | ICD-10-CM

## 2012-04-20 DIAGNOSIS — I10 Essential (primary) hypertension: Secondary | ICD-10-CM

## 2012-04-20 LAB — GLUCOSE, CAPILLARY
Glucose-Capillary: 193 mg/dL — ABNORMAL HIGH (ref 70–99)
Glucose-Capillary: 126 mg/dL — ABNORMAL HIGH (ref 70–99)

## 2012-04-20 MED ORDER — ASPIRIN 81 MG PO TABS: 324.0000 mg | ORAL_TABLET | Freq: Two times a day (BID) | ORAL | Status: DC

## 2012-04-20 MED ORDER — ASPIRIN 81 MG PO TABS: 324.0000 mg | ORAL_TABLET | Freq: Every day | ORAL | Status: DC

## 2012-04-20 NOTE — Discharge Instructions (Signed)
STROKE/TIA DISCHARGE INSTRUCTIONS SMOKING Cigarette smoking nearly doubles your risk of having a stroke & is the single most alterable risk factor  If you smoke or have smoked in the last 12 months, you are advised to quit smoking for your health.  Most of the excess cardiovascular risk related to smoking disappears within a year of stopping.  Ask you doctor about anti-smoking medications   Quit Line: 1-800-QUIT NOW  Free Smoking Cessation Classes [phone]  CHOLESTEROL Know your levels; limit fat & cholesterol in your diet  Lipid Panel     Component Value Date/Time   CHOL 122 04/18/2012 2130   TRIG 318* 04/18/2012 2130   HDL 25* 04/18/2012 2130   CHOLHDL 4.9 04/18/2012 2130   VLDL 64* 04/18/2012 2130   LDLCALC 33 04/18/2012 2130      Many patients benefit from treatment even if their cholesterol is at goal.  Goal: Total Cholesterol (CHOL) less than 160  Goal:  Triglycerides (TRIG) less than 150  Goal:  HDL greater than 40  Goal:  LDL (LDLCALC) less than 100   BLOOD PRESSURE American Stroke Association blood pressure target is less that 120/80 mm/Hg  Your discharge blood pressure is:  BP: 114/67 mmHg  Monitor your blood pressure  Limit your salt and alcohol intake  Many individuals will require more than one medication for high blood pressure  DIABETES (A1c is a blood sugar average for last 3 months) Goal HGBA1c is under 7% (HBGA1c is blood sugar average for last 3 months)  Diabetes: {STROKE DC DIABETES:22357}    Lab Results  Component Value Date   HGBA1C 7.8* 04/18/2012     Your HGBA1c can be lowered with medications, healthy diet, and exercise.  Check your blood sugar as directed by your physician  Call your physician if you experience unexplained or low blood sugars.  PHYSICAL ACTIVITY/REHABILITATION Goal is 30 minutes at least 4 days per week    {STROKE DC ACTIVITY/REHAB:22359}  Activity decreases your risk of heart attack and stroke and makes your heart stronger.   It helps control your weight and blood pressure; helps you relax and can improve your mood.  Participate in a regular exercise program.  Talk with your doctor about the best form of exercise for you (dancing, walking, swimming, cycling).  DIET/WEIGHT Goal is to maintain a healthy weight  Your discharge diet is: Carb Control *** liquids Your height is:  Height: 5\' 8"  (172.7 cm) Your current weight is: Weight: 118.842 kg (262 lb) Your Body Mass Index (BMI) is:  BMI (Calculated): 39.9   Following the type of diet specifically designed for you will help prevent another stroke.  Your goal weight range is:  ***  Your goal Body Mass Index (BMI) is 19-24.  Healthy food habits can help reduce 3 risk factors for stroke:  High cholesterol, hypertension, and excess weight.  RESOURCES Stroke/Support Group:  Call [phone]  they meet the 3rd Sunday of the month on the Rehab Unit at [facility], [place] ( no meetings June, July & Aug).  STROKE EDUCATION PROVIDED/REVIEWED AND GIVEN TO PATIENT Stroke warning signs and symptoms How to activate emergency medical system (call 911). Medications prescribed at discharge. Need for follow-up after discharge. Personal risk factors for stroke. Pneumonia vaccine given:   {STROKE DC YES/NO/DATE:22363} Flu vaccine given:   {STROKE DC YES/NO/DATE:22363} My questions have been answered, the writing is legible, and I understand these instructions.  I will adhere to these goals & educational materials that have been  provided to me after my discharge from the hospital.

## 2012-04-20 NOTE — Discharge Summary (Addendum)
Physician Discharge Summary  Patient ID: Jerry Shaffer MRN: [id number] DOB/AGE: [birth year] 50 y.o.  Admit date: 04/18/2012 Discharge date: 04/20/2012  Primary Care Physician:  Provider Not In System   Discharge Diagnoses:    Patient Active Problem List  Diagnoses  . DIABETES MELLITUS, CONTROLLED  . ATTENTION DEFICIT HYPERACTIVITY DISORDER, ADULT  . MIGRAINE HEADACHE  . GERD  . DIVERTICULOSIS OF COLON  . MICROSCOPIC HEMATURIA  . KNEE PAIN, LEFT  . CHEST PAIN, INTERMITTENT  . ABDOMINAL PAIN, LEFT LOWER QUADRANT  . HYPERTENSION NEC  . TIA (transient ischemic attack)    Medication List  As of 04/20/2012  1:25 PM   TAKE these medications         aspirin 81 MG tablet   Take 4 tablets (324 mg total) by mouth daily.      atorvastatin 20 MG tablet   Commonly known as: LIPITOR   Take 20 mg by mouth daily.      b complex-C-folic acid 1 MG capsule   Take 1 capsule by mouth daily.      clonazePAM 0.5 MG tablet   Commonly known as: KLONOPIN   Take 0.5 mg by mouth 3 (three) times daily. 2 tabs in morning and 1 at night      famotidine 20 MG tablet   Commonly known as: PEPCID   Take 40 mg by mouth daily.      glipiZIDE 10 MG tablet   Commonly known as: GLUCOTROL   Take 10 mg by mouth once.      lamoTRIgine 25 MG tablet   Commonly known as: LAMICTAL   Take 50 mg by mouth 2 (two) times daily.      metFORMIN 500 MG tablet   Commonly known as: GLUCOPHAGE   Take 500 mg by mouth 2 (two) times daily with a meal.      methylphenidate 20 MG ER tablet   Commonly known as: METADATE ER   Take 20 mg by mouth 3 (three) times daily. 2 Tabs in Morning and 1 at mid-day      multivitamin with minerals tablet   Take 1 tablet by mouth daily.      RABEprazole 20 MG tablet   Commonly known as: ACIPHEX   Take 20 mg by mouth daily.             Disposition and Follow-up:  Follow up with Primary MD at [facility]. Follow up with Dr. Delia Heady, Neurologist.  Consults:   neurology Dr. Hayden Rasmussen, Neurologist.  Significant Diagnostic Studies:  Ct Head Wo Contrast  04/18/2012  *RADIOLOGY REPORT*  Clinical Data: Dizziness  CT HEAD WITHOUT CONTRAST  Technique:  Contiguous axial images were obtained from the base of the skull through the vertex without contrast.  Comparison: 10/11/2011  Findings: There is no evidence for acute hemorrhage, hydrocephalus, mass lesion, or abnormal extra-axial fluid collection.  No definite CT evidence for acute infarction.  The visualized paranasal sinuses and mastoid air cells are clear.  IMPRESSION: Stable.  No acute intracranial abnormality.  Original Report Authenticated By: ERIC A. MANSELL, M.D.   Mr Laqueta Jean Wo Contrast  04/19/2012  *RADIOLOGY REPORT*  Clinical Data:  Dizziness and headache  MRI HEAD WITHOUT AND WITH CONTRAST MRA HEAD WITHOUT CONTRAST  Technique:  Multiplanar, multiecho pulse sequences of the brain and surrounding structures were obtained without and with intravenous contrast.  Angiographic images of the head were obtained using MRA technique without contrast.  Contrast: 20mL MULTIHANCE GADOBENATE DIMEGLUMINE 529 MG/ML  IV SOLN  Comparison:  CT head 04/18/2012  MRI HEAD WITHOUT AND WITH CONTRAST  Findings:  3 mm focus of increased signal on diffusion weighted imaging in the right pontomedullary junction may represent artifact or possibly acute infarct.  This area does show decreased signal on ADC.  There is a similar although less apparent signal abnormality on the left.  Otherwise no acute infarct.  Negative for hemorrhage or mass lesion.  Ventricle size is normal.  Pituitary gland is normal in size.  Postcontrast imaging of the brain reveals normal enhancement.  No enhancing mass or abnormal meningeal enhancement is present.  Image quality degraded by motion particularly on the postcontrast images.  Chronic sinusitis with mucosal edema in the paranasal sinuses.  IMPRESSION: Possible area of acute infarct in the right  pontomedullary junction.  Correlate with symptoms.  I would favor that this is artifact on diffusion weighted imaging.  Otherwise no acute abnormality.  MRA HEAD  Findings: Both vertebral arteries are patent to the basilar.  PICA is patent bilaterally.  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  Internal carotid artery is patent bilaterally without stenosis. Anterior and middle cerebral arteries are patent.  No aneurysm.  Image quality degraded by patient motion.  IMPRESSION: Motion degraded study.  No large vessel occlusion.  Original Report Authenticated By: Camelia Phenes, M.D.   Mr Mra Head/brain Wo Cm  04/19/2012  *RADIOLOGY REPORT*  Clinical Data:  Dizziness and headache  MRI HEAD WITHOUT AND WITH CONTRAST MRA HEAD WITHOUT CONTRAST  Technique:  Multiplanar, multiecho pulse sequences of the brain and surrounding structures were obtained without and with intravenous contrast.  Angiographic images of the head were obtained using MRA technique without contrast.  Contrast: 20mL MULTIHANCE GADOBENATE DIMEGLUMINE 529 MG/ML IV SOLN  Comparison:  CT head 04/18/2012  MRI HEAD WITHOUT AND WITH CONTRAST  Findings:  3 mm focus of increased signal on diffusion weighted imaging in the right pontomedullary junction may represent artifact or possibly acute infarct.  This area does show decreased signal on ADC.  There is a similar although less apparent signal abnormality on the left.  Otherwise no acute infarct.  Negative for hemorrhage or mass lesion.  Ventricle size is normal.  Pituitary gland is normal in size.  Postcontrast imaging of the brain reveals normal enhancement.  No enhancing mass or abnormal meningeal enhancement is present.  Image quality degraded by motion particularly on the postcontrast images.  Chronic sinusitis with mucosal edema in the paranasal sinuses.  IMPRESSION: Possible area of acute infarct in the right pontomedullary  junction.  Correlate with symptoms.  I would favor that this is artifact on diffusion weighted imaging.  Otherwise no acute abnormality.  MRA HEAD  Findings: Both vertebral arteries are patent to the basilar.  PICA is patent bilaterally.  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  Internal carotid artery is patent bilaterally without stenosis. Anterior and middle cerebral arteries are patent.  No aneurysm.  Image quality degraded by patient motion.  IMPRESSION: Motion degraded study.  No large vessel occlusion.  Original Report Authenticated By: Camelia Phenes, M.D.    Brief H and P: For complete details, refer to admission H and P, however, in brief, this is a 51 year old male, with known history of DM 2, Dyslipidemia, migraines, Bipolar disorder and previous TIA, presenting with dizziness, tingling on the left side  of the face for the past 2 days and numbnews. He was admttted for further evaluation, investigation and management.  Physical Exam: On 5/7/113. General:   Alert, communicative, fully oriented, talking in complete sentences, not short of breath at rest.  HEENT:  No clinical pallor, no jaundice, no conjunctival injection or discharge. Hydration is fair. NECK:  Supple, JVP not seen, no carotid bruits, no palpable lymphadenopathy, no palpable goiter. CHEST:  Clinically clear to auscultation, no wheezes, no crackles. HEART:  Sounds 1 and 2 heard, normal, regular, no murmurs. ABDOMEN:  Full, soft, no scars, non-tender, no palpable organomegaly, no palpable masses, normal bowel sounds. GENITALIA:  Not examined. LOWER EXTREMITIES:  No pitting edema, palpable peripheral pulses. MUSCULOSKELETAL SYSTEM:  Generalized osteoarthritic changes, otherwise, normal. CENTRAL NERVOUS SYSTEM:  No focal neurologic deficit on gross examination.    Hospital Course:   1. TIA: Patient presented with symptoms described above. He does  have risk factors for cerebrovascular disease. He was placed on telemetric monitoring, neuro-checks, and anti-platelet treatment. Symptoms have resolved. Brain MRA , Carotid/vertebral artery duplex scans, showed no evidence of hemodynamically significant stenoses. 2D echocardiogram of 04/19/22, showed normal left ventricular cavity size, mild focal basal hypertrophy of the septum and ejection fraction of 65% to 70%. There were no regional wall motion abnormalities. Brain Mri of the brain showed questionable acute infarct in the pontine medullary area, versus artefact. Neurology consultation was provided by Dr Hayden Rasmussen, who opined that the changes were seen on both DWI and ADC but not on flair and equivocal on ADC. Clinically, the symptoms were transient, so, would call the event a TIA. Patient has been reassured accordingly. Patient has been discharged on ASA 324 mg daily, as he cannot afford Plavix. 2. Diabetes Mellitus: This was managed with diet/SSI. HBA1C  was 7.8. CBGs were reasonable, during this hospitalization. Continued on Metformin and Glipizide.  3. Hyperlipidemia: Lipid profile, revealed TC 122; TG 318; LDL 33 and HDL 25. Patient continues on pre-admission statin. Have recommended diet and exercise, to attempt to improve triglyceridemia. Again, patient cannot afford any further additions, to his medication.  Comment: Stable for discharge on 04/20/12.  Time spent on Discharge: 35 mins.  Signed: Myranda Pavone,CHRISTOPHER 04/20/2012, 1:25 PM

## 2012-04-20 NOTE — Progress Notes (Signed)
Clinical Social Work Department BRIEF PSYCHOSOCIAL ASSESSMENT 04/20/2012  Patient:  Jerry Shaffer, Jerry Shaffer     Account Number:  [id number]     Admit date:  04/18/2012  Clinical Social Worker:  Dennison Bulla  Date/Time:  04/20/2012 01:45 PM  Referred by:  RN  Date Referred:  04/20/2012 Referred for  Transportation assistance   Other Referral:   Interview type:  Patient Other interview type:    PSYCHOSOCIAL DATA Living Status:  ALONE Admitted from facility:   Level of care:   Primary support name:  Katie Primary support relationship to patient:  CHILD, MINOR Degree of support available:   Unknown    CURRENT CONCERNS Current Concerns  Other - See comment   Other Concerns:   Transportation to [employer]    SOCIAL WORK ASSESSMENT / PLAN CSW received call from RN stating that patient was transfer from [facility] and needed a ride back to get his car. Per patient, he has keys and is able to drive home. CSW spoke with security who was agreeable to transport patient to [facility] to get car. CSW informed RN of plan and all parties agreeable to dc plan. CSW is signing off.   Assessment/plan status:  No Further Intervention Required Other assessment/ plan:   Information/referral to community resources:   Security to transport    PATIENT'S/FAMILY'S RESPONSE TO PLAN OF CARE: Patient agreeable to plan

## 2012-04-20 NOTE — Progress Notes (Signed)
Pt d/c to home by car. Assessment stable. 

## 2012-04-22 ENCOUNTER — Other Ambulatory Visit (HOSPITAL_COMMUNITY): Payer: Self-pay

## 2012-05-12 ENCOUNTER — Ambulatory Visit: Payer: Self-pay | Attending: Family Medicine | Admitting: Physical Therapy

## 2012-05-12 DIAGNOSIS — M25619 Stiffness of unspecified shoulder, not elsewhere classified: Secondary | ICD-10-CM | POA: Insufficient documentation

## 2012-05-12 DIAGNOSIS — IMO0001 Reserved for inherently not codable concepts without codable children: Secondary | ICD-10-CM | POA: Insufficient documentation

## 2012-05-12 DIAGNOSIS — R293 Abnormal posture: Secondary | ICD-10-CM | POA: Insufficient documentation

## 2012-05-12 DIAGNOSIS — M25519 Pain in unspecified shoulder: Secondary | ICD-10-CM | POA: Insufficient documentation

## 2012-05-17 ENCOUNTER — Ambulatory Visit: Payer: Self-pay | Attending: Family Medicine | Admitting: Physical Therapy

## 2012-05-17 DIAGNOSIS — R293 Abnormal posture: Secondary | ICD-10-CM | POA: Insufficient documentation

## 2012-05-17 DIAGNOSIS — IMO0001 Reserved for inherently not codable concepts without codable children: Secondary | ICD-10-CM | POA: Insufficient documentation

## 2012-05-17 DIAGNOSIS — M25619 Stiffness of unspecified shoulder, not elsewhere classified: Secondary | ICD-10-CM | POA: Insufficient documentation

## 2012-05-17 DIAGNOSIS — M25519 Pain in unspecified shoulder: Secondary | ICD-10-CM | POA: Insufficient documentation

## 2012-05-19 ENCOUNTER — Ambulatory Visit: Payer: Self-pay | Admitting: Physical Therapy

## 2012-05-24 ENCOUNTER — Ambulatory Visit: Payer: Self-pay | Admitting: Physical Therapy

## 2012-05-26 ENCOUNTER — Ambulatory Visit: Payer: Self-pay | Admitting: Physical Therapy

## 2012-06-02 ENCOUNTER — Ambulatory Visit: Payer: Self-pay | Admitting: Physical Therapy

## 2012-06-09 ENCOUNTER — Ambulatory Visit: Payer: Self-pay | Admitting: Physical Therapy

## 2012-06-11 ENCOUNTER — Encounter: Payer: Self-pay | Admitting: Physical Therapy

## 2012-06-21 ENCOUNTER — Ambulatory Visit: Payer: Self-pay | Attending: Family Medicine | Admitting: Physical Therapy

## 2012-06-21 DIAGNOSIS — M25619 Stiffness of unspecified shoulder, not elsewhere classified: Secondary | ICD-10-CM | POA: Insufficient documentation

## 2012-06-21 DIAGNOSIS — R293 Abnormal posture: Secondary | ICD-10-CM | POA: Insufficient documentation

## 2012-06-21 DIAGNOSIS — IMO0001 Reserved for inherently not codable concepts without codable children: Secondary | ICD-10-CM | POA: Insufficient documentation

## 2012-06-21 DIAGNOSIS — M25519 Pain in unspecified shoulder: Secondary | ICD-10-CM | POA: Insufficient documentation

## 2012-06-23 ENCOUNTER — Ambulatory Visit: Payer: Self-pay | Admitting: Physical Therapy

## 2012-06-24 DIAGNOSIS — I639 Cerebral infarction, unspecified: Secondary | ICD-10-CM | POA: Insufficient documentation

## 2012-06-28 ENCOUNTER — Ambulatory Visit: Payer: Self-pay | Admitting: Physical Therapy

## 2012-07-01 ENCOUNTER — Ambulatory Visit: Payer: Self-pay | Admitting: Physical Therapy

## 2012-07-07 ENCOUNTER — Ambulatory Visit: Payer: Self-pay | Admitting: Physical Therapy

## 2012-07-13 ENCOUNTER — Encounter: Payer: Self-pay | Admitting: Physical Therapy

## 2012-07-14 ENCOUNTER — Encounter: Payer: Self-pay | Admitting: Physical Therapy

## 2012-07-16 ENCOUNTER — Ambulatory Visit: Payer: Self-pay | Attending: Family Medicine | Admitting: Physical Therapy

## 2012-07-16 DIAGNOSIS — IMO0001 Reserved for inherently not codable concepts without codable children: Secondary | ICD-10-CM | POA: Insufficient documentation

## 2012-07-16 DIAGNOSIS — R293 Abnormal posture: Secondary | ICD-10-CM | POA: Insufficient documentation

## 2012-07-16 DIAGNOSIS — M25619 Stiffness of unspecified shoulder, not elsewhere classified: Secondary | ICD-10-CM | POA: Insufficient documentation

## 2012-07-16 DIAGNOSIS — M25519 Pain in unspecified shoulder: Secondary | ICD-10-CM | POA: Insufficient documentation

## 2012-07-26 ENCOUNTER — Inpatient Hospital Stay (HOSPITAL_COMMUNITY)
Admission: EM | Admit: 2012-07-26 | Discharge: 2012-07-29 | DRG: 392 | Disposition: A | Discharge status: Home / Self Care | Payer: MEDICAID | Attending: Internal Medicine | Admitting: Internal Medicine

## 2012-07-26 ENCOUNTER — Emergency Department (HOSPITAL_COMMUNITY): Payer: Self-pay

## 2012-07-26 ENCOUNTER — Encounter (HOSPITAL_COMMUNITY): Payer: Self-pay | Admitting: Emergency Medicine

## 2012-07-26 DIAGNOSIS — K5792 Diverticulitis of intestine, part unspecified, without perforation or abscess without bleeding: Secondary | ICD-10-CM

## 2012-07-26 DIAGNOSIS — F909 Attention-deficit hyperactivity disorder, unspecified type: Secondary | ICD-10-CM

## 2012-07-26 DIAGNOSIS — J438 Other emphysema: Secondary | ICD-10-CM | POA: Diagnosis present

## 2012-07-26 DIAGNOSIS — E119 Type 2 diabetes mellitus without complications: Secondary | ICD-10-CM | POA: Diagnosis present

## 2012-07-26 DIAGNOSIS — M25569 Pain in unspecified knee: Secondary | ICD-10-CM

## 2012-07-26 DIAGNOSIS — K5732 Diverticulitis of large intestine without perforation or abscess without bleeding: Principal | ICD-10-CM

## 2012-07-26 DIAGNOSIS — R519 Headache, unspecified: Secondary | ICD-10-CM | POA: Diagnosis present

## 2012-07-26 DIAGNOSIS — Z880 Allergy status to penicillin: Secondary | ICD-10-CM

## 2012-07-26 DIAGNOSIS — Z7982 Long term (current) use of aspirin: Secondary | ICD-10-CM

## 2012-07-26 DIAGNOSIS — Z8249 Family history of ischemic heart disease and other diseases of the circulatory system: Secondary | ICD-10-CM

## 2012-07-26 DIAGNOSIS — Z9089 Acquired absence of other organs: Secondary | ICD-10-CM

## 2012-07-26 DIAGNOSIS — R3129 Other microscopic hematuria: Secondary | ICD-10-CM

## 2012-07-26 DIAGNOSIS — G459 Transient cerebral ischemic attack, unspecified: Secondary | ICD-10-CM

## 2012-07-26 DIAGNOSIS — G43909 Migraine, unspecified, not intractable, without status migrainosus: Secondary | ICD-10-CM

## 2012-07-26 DIAGNOSIS — R51 Headache: Secondary | ICD-10-CM

## 2012-07-26 DIAGNOSIS — R200 Anesthesia of skin: Secondary | ICD-10-CM | POA: Diagnosis present

## 2012-07-26 DIAGNOSIS — F319 Bipolar disorder, unspecified: Secondary | ICD-10-CM | POA: Diagnosis present

## 2012-07-26 DIAGNOSIS — Z8673 Personal history of transient ischemic attack (TIA), and cerebral infarction without residual deficits: Secondary | ICD-10-CM

## 2012-07-26 DIAGNOSIS — F17200 Nicotine dependence, unspecified, uncomplicated: Secondary | ICD-10-CM | POA: Diagnosis present

## 2012-07-26 DIAGNOSIS — E785 Hyperlipidemia, unspecified: Secondary | ICD-10-CM | POA: Diagnosis present

## 2012-07-26 DIAGNOSIS — K5730 Diverticulosis of large intestine without perforation or abscess without bleeding: Secondary | ICD-10-CM

## 2012-07-26 DIAGNOSIS — R209 Unspecified disturbances of skin sensation: Secondary | ICD-10-CM | POA: Diagnosis present

## 2012-07-26 DIAGNOSIS — R079 Chest pain, unspecified: Secondary | ICD-10-CM

## 2012-07-26 DIAGNOSIS — R1032 Left lower quadrant pain: Secondary | ICD-10-CM

## 2012-07-26 DIAGNOSIS — IMO0002 Reserved for concepts with insufficient information to code with codable children: Secondary | ICD-10-CM

## 2012-07-26 DIAGNOSIS — K219 Gastro-esophageal reflux disease without esophagitis: Secondary | ICD-10-CM

## 2012-07-26 LAB — URINALYSIS, ROUTINE W REFLEX MICROSCOPIC
Ketones, ur: NEGATIVE mg/dL
Leukocytes, UA: NEGATIVE
Nitrite: NEGATIVE
Urobilinogen, UA: 0.2 mg/dL (ref 0.0–1.0)
pH: 6 (ref 5.0–8.0)

## 2012-07-26 LAB — CBC WITH DIFFERENTIAL/PLATELET
Basophils Absolute: 0.1 10*3/uL (ref 0.0–0.1)
Basophils Relative: 0 % (ref 0–1)
MCHC: 32.2 g/dL (ref 30.0–36.0)
Neutro Abs: 17.7 10*3/uL — ABNORMAL HIGH (ref 1.7–7.7)
Neutrophils Relative %: 84 % — ABNORMAL HIGH (ref 43–77)
RDW: 14.3 % (ref 11.5–15.5)

## 2012-07-26 LAB — BASIC METABOLIC PANEL
Chloride: 102 mEq/L (ref 96–112)
Creatinine, Ser: 0.98 mg/dL (ref 0.50–1.35)
GFR calc Af Amer: >90 mL/min (ref >90)
Potassium: 3.7 mEq/L (ref 3.5–5.1)

## 2012-07-26 MED ORDER — SODIUM CHLORIDE 0.9 % IV BOLUS (SEPSIS)
1000.0000 mL | Freq: Once | INTRAVENOUS | Status: AC
  Administered 2012-07-26: 1000 mL via INTRAVENOUS

## 2012-07-26 MED ORDER — IOHEXOL 300 MG/ML  SOLN
100.0000 mL | Freq: Once | INTRAMUSCULAR | Status: AC | PRN
  Administered 2012-07-26: 100 mL via INTRAVENOUS

## 2012-07-26 MED ORDER — ONDANSETRON HCL 4 MG/2ML IJ SOLN
4.0000 mg | Freq: Once | INTRAMUSCULAR | Status: AC
  Administered 2012-07-26: 4 mg via INTRAVENOUS
  Filled 2012-07-26: qty 2

## 2012-07-26 MED ORDER — METRONIDAZOLE IN NACL 5-0.79 MG/ML-% IV SOLN
500.0000 mg | Freq: Once | INTRAVENOUS | Status: AC
  Administered 2012-07-26: 500 mg via INTRAVENOUS
  Filled 2012-07-26: qty 100

## 2012-07-26 MED ORDER — CIPROFLOXACIN IN D5W 400 MG/200ML IV SOLN
400.0000 mg | Freq: Once | INTRAVENOUS | Status: DC
  Filled 2012-07-26: qty 200

## 2012-07-26 MED ORDER — SODIUM CHLORIDE 0.9 % IV SOLN: Freq: Once | INTRAVENOUS | Status: DC

## 2012-07-26 MED ORDER — HYDROMORPHONE HCL PF 1 MG/ML IJ SOLN
1.0000 mg | Freq: Once | INTRAMUSCULAR | Status: AC
  Administered 2012-07-26: 1 mg via INTRAVENOUS
  Filled 2012-07-26: qty 1

## 2012-07-26 NOTE — H&P (Signed)
Jerry Shaffer is an 51 y.o. male.   Patient was seen and examined on July 26, 2012 at 11:30 PM. PCP - used to be [employer] [facility]. Chief Complaint: Abdominal pain. HPI: 51 year old male with history of diabetes mellitus type 2, bipolar disorder, ongoing tobacco abuse, history of migraine and TIA has been experiencing abdominal pain last 2 days. The pain started off yesterday morning when he woke up and it was initially in the epigastric area and soon the pain moved to the both lower quadrants and suprapubic. He did not eat much for the fear of pain. He did have one loose bowel movement today. Denies using any antibiotics recently. In addition patient has been experiencing right-sided temporal headache with left facial numbness for the same period of time. Patient did not have any visual symptoms difficulty speaking or any loss of function of upper or lower extremity strength. In the ER CT abdomen and pelvis shows possibility of diverticulitis involving the sigmoid area and CT head is negative for anything acute. Patient will be admitted for further management. Patient states his abdominal pain increases on minimal moment.  Past Medical History  Diagnosis Date  . Transient ischemic attack (TIA)   . Diabetes mellitus   . Bipolar 1 disorder   . Migraine   . Dyslipidemia   . Emphysema   . PONV (postoperative nausea and vomiting)   . Shortness of breath   . Mental disorder     BIPOLAR DISORDER    Past Surgical History  Procedure Date  . Lumbar disc surgery   . Hemorroidectomy   . Tonsillectomy and adenoidectomy   . Knee arthroscopy     Family History  Problem Relation Age of Onset  . Diabetes type II    . Coronary artery disease    . Bipolar disorder    . Lung cancer Father    Social History:  reports that he has been smoking Cigarettes.  He has smoked for the past 30 years. His smokeless tobacco use includes Snuff. He reports that he does not drink alcohol or use illicit  drugs.  Allergies:  Allergies  Allergen Reactions  . Penicillins     REACTION: anaphylaxis    Medications Prior to Admission  Medication Sig Dispense Refill  . aspirin 81 MG tablet Take 4 tablets (324 mg total) by mouth daily.  100 tablet  0  . atorvastatin (LIPITOR) 20 MG tablet Take 20 mg by mouth daily.       Marland Kitchen b complex-C-folic acid 1 MG capsule Take 1 capsule by mouth daily.      . clonazePAM (KLONOPIN) 0.5 MG tablet Take 0.5 mg by mouth 3 (three) times daily. 2 tabs in morning and 1 at night      . famotidine (PEPCID) 20 MG tablet Take 20 mg by mouth daily.       Marland Kitchen glipiZIDE (GLUCOTROL) 10 MG tablet Take 10 mg by mouth once.       . lamoTRIgine (LAMICTAL) 25 MG tablet Take 50 mg by mouth 2 (two) times daily.       . metFORMIN (GLUCOPHAGE) 500 MG tablet Take 500 mg by mouth 2 (two) times daily with a meal.        . methylphenidate (METADATE ER) 20 MG ER tablet Take 20 mg by mouth 3 (three) times daily. 2 Tabs in Morning and 1 at mid-day      . Multiple Vitamins-Minerals (MULTIVITAMIN WITH MINERALS) tablet Take 1 tablet by mouth daily.      Marland Kitchen  RABEprazole (ACIPHEX) 20 MG tablet Take 20 mg by mouth daily.         Results for orders placed during the hospital encounter of 07/26/12 (from the past 48 hour(s))  CBC WITH DIFFERENTIAL     Status: Abnormal   Collection Time   07/26/12  2:39 PM      Component Value Range Comment   WBC 21.2 (*) 4.0 - 10.5 K/uL    RBC 5.07  4.22 - 5.81 MIL/uL    Hemoglobin 14.3  13.0 - 17.0 g/dL    HCT 40.9  81.1 - 91.4 %    MCV 87.6  78.0 - 100.0 fL    MCH 28.2  26.0 - 34.0 pg    MCHC 32.2  30.0 - 36.0 g/dL    RDW 78.2  95.6 - 21.3 %    Platelets 281  150 - 400 K/uL    Neutrophils Relative 84 (*) 43 - 77 %    Neutro Abs 17.7 (*) 1.7 - 7.7 K/uL    Lymphocytes Relative 10 (*) 12 - 46 %    Lymphs Abs 2.1  0.7 - 4.0 K/uL    Monocytes Relative 5  3 - 12 %    Monocytes Absolute 1.1 (*) 0.1 - 1.0 K/uL    Eosinophils Relative 1  0 - 5 %    Eosinophils  Absolute 0.2  0.0 - 0.7 K/uL    Basophils Relative 0  0 - 1 %    Basophils Absolute 0.1  0.0 - 0.1 K/uL   BASIC METABOLIC PANEL     Status: Abnormal   Collection Time   07/26/12  2:39 PM      Component Value Range Comment   Sodium 139  135 - 145 mEq/L    Potassium 3.7  3.5 - 5.1 mEq/L    Chloride 102  96 - 112 mEq/L    CO2 24  19 - 32 mEq/L    Glucose, Bld 129 (*) 70 - 99 mg/dL    BUN 13  6 - 23 mg/dL    Creatinine, Ser 0.86  0.50 - 1.35 mg/dL    Calcium 9.7  8.4 - 57.8 mg/dL    GFR calc non Af Amer >90  >90 mL/min    GFR calc Af Amer >90  >90 mL/min   URINALYSIS, ROUTINE W REFLEX MICROSCOPIC     Status: Normal   Collection Time   07/26/12  9:40 PM      Component Value Range Comment   Color, Urine YELLOW  YELLOW    APPearance CLEAR  CLEAR    Specific Gravity, Urine 1.009  1.005 - 1.030    pH 6.0  5.0 - 8.0    Glucose, UA NEGATIVE  NEGATIVE mg/dL    Hgb urine dipstick NEGATIVE  NEGATIVE    Bilirubin Urine NEGATIVE  NEGATIVE    Ketones, ur NEGATIVE  NEGATIVE mg/dL    Protein, ur NEGATIVE  NEGATIVE mg/dL    Urobilinogen, UA 0.2  0.0 - 1.0 mg/dL    Nitrite NEGATIVE  NEGATIVE    Leukocytes, UA NEGATIVE  NEGATIVE MICROSCOPIC NOT DONE ON URINES WITH NEGATIVE PROTEIN, BLOOD, LEUKOCYTES, NITRITE, OR GLUCOSE <1000 mg/dL.   Ct Head Wo Contrast  07/26/2012  *RADIOLOGY REPORT*  Clinical Data: Left facial numbness.  CT HEAD WITHOUT CONTRAST  Technique:  Contiguous axial images were obtained from the base of the skull through the vertex without contrast.  Comparison: 04/19/2012  Findings: There is no evidence of acute  intracranial hemorrhage, brain edema, mass lesion, acute infarction,   mass effect, or midline shift. Acute infarct may be inapparent on noncontrast CT. No other intra-axial abnormalities are seen, and the ventricles and sulci are within normal limits in size and symmetry.   No abnormal extra-axial fluid collections or masses are identified.  No significant calvarial abnormality.   IMPRESSION: 1. Negative for bleed or other acute intracranial process.  Original Report Authenticated By: Osa Craver, M.D.   Ct Abdomen Pelvis W Contrast  07/26/2012  *RADIOLOGY REPORT*  Clinical Data: Low abdominal pain.  CT ABDOMEN AND PELVIS WITH CONTRAST  Technique:  Multidetector CT imaging of the abdomen and pelvis was performed following the standard protocol during bolus administration of intravenous contrast.  Contrast: OMNIPAQUE IOHEXOL 300 MG/ML  SOLN  Comparison: 05/10/2010  Findings: Dependent atelectasis in the visualized lung bases. Stable sub centimeter probable hepatic cysts.  No new liver lesion. Unremarkable gallbladder, spleen, adrenal glands, kidneys, pancreas.  Patchy aortic plaque without aneurysm.  Portal vein patent.  Stomach and small bowel decompressed.  Appendix not discretely identified.  The colon is nondilated.  There are scattered descending and sigmoid diverticula.  There are marked inflammatory/edematous changes around the distal sigmoid colon with a short segment of circumferential wall thickening, which is new since prior study.  No evidence of abscess.  There are a few regional prominent sub centimeter mesenteric lymph nodes.  Urinary bladder incompletely distended.  No ascites.  No free air. Degenerative disc disease L5-S1.  No hydronephrosis.  IMPRESSION:  1. Probable distal sigmoid diverticulitis without abscess. Given the regional   prominent mesenteric lymph nodes, follow-up recommended to exclude mucosal lesion.  Original Report Authenticated By: Osa Craver, M.D.    Review of Systems  Constitutional: Positive for fever and chills.  Eyes: Negative.   Respiratory: Negative.   Cardiovascular: Negative.   Gastrointestinal: Positive for nausea and abdominal pain.  Genitourinary: Negative.   Musculoskeletal: Negative.   Skin: Negative.   Neurological: Positive for headaches.       Left facial numbness.  Psychiatric/Behavioral:  Negative.     Blood pressure 127/78, pulse 86, temperature 98.6 F (37 C), temperature source Oral, resp. rate 16, SpO2 96.00%. Physical Exam  Constitutional: He is oriented to person, place, and time. He appears well-developed and well-nourished. No distress.  HENT:  Head: Normocephalic.  Right Ear: External ear normal.  Left Ear: External ear normal.  Nose: Nose normal.  Mouth/Throat: Oropharynx is clear and moist. No oropharyngeal exudate.  Eyes: Conjunctivae are normal. Pupils are equal, round, and reactive to light. Right eye exhibits no discharge. Left eye exhibits no discharge. No scleral icterus.  Neck: Normal range of motion. Neck supple.  Cardiovascular: Normal rate and regular rhythm.   Respiratory: Effort normal and breath sounds normal. No respiratory distress. He has no wheezes. He has no rales.  GI: Soft. Bowel sounds are normal. He exhibits no distension. There is tenderness (abdomen is tender on the both lower quadrants.). There is no rebound and no guarding.  Musculoskeletal: Normal range of motion. He exhibits no edema and no tenderness.  Neurological: He is alert and oriented to person, place, and time.       Moves all extremities 5/5. No facial asymmetry. Tongue is midline.  Skin: Skin is warm and dry. He is not diaphoretic.     Assessment/Plan #1. Diverticulitis - patient at this time will be kept Shafferp.o. except medications. Continue with Cipro and Flagyl and pain relief  medications and IV fluids. I have consulted surgery Dr. Lindie Spruce who will be seeing patient in consult. Patient did have a colonoscopy last year and as per patient he had a benign polyp. #2. Left facial numbness with headache - we'll get MRI of the brain. Until then patient will be on neurochecks. Patient does have previous history of TIAs. Patient states his headache is not typical of his migraine. #3. Diabetes mellitus2 - patient is refusing any insulin injections for sliding-scale coverage. At this  time as patient is Shafferp.o. we will hold off his diabetic medications for now. Patient is agreeing with checking CBG. #4. History of bipolar disorder and ADHD - continue present medications. #5. Hyperlipidemia - continue present medications.  CODE STATUS - full code.  Jerry Biederman N. 07/26/2012, 11:55 PM

## 2012-07-26 NOTE — ED Provider Notes (Signed)
History     CSN: [id number]  Arrival date & time 07/26/12  1427   First MD Initiated Contact with Patient 07/26/12 1845      Chief Complaint  Patient presents with  . Abdominal Pain  . Headache    (Consider location/radiation/quality/duration/timing/severity/associated sxs/prior treatment) HPI History from patient. 51 year old male with past medical history of TIA, diabetes, diverticulosis who presents with complaint of abdominal pain and tingling to the left side of his face.  He states that the abdominal pain began 2 days ago. Pain is described as sharp and crampy and intermittent. It is located to the generalized lower abdomen. It does not radiate. It worsens with palpation or movements. Improves with rest. No treatment prior to coming here. He states that he did run a fever last night with MAXIMUM TEMPERATURE 102. He additionally notes a sensation when he awoke this morning of pain until he emptied his bladder. He denies any dysuria or pyuria. He has not had an appetite for the past 2 days and has not been taking his diabetic medications. He has had slight nausea but denies vomiting. He has not had any change in his bowel movements. He denies urinary symptoms. No history of abdominal surgery.  Patient also complains of headache and tingling to the left side of his face. This has been going on for the past one to 2 days as well. He denies noticing any facial asymmetry. He does have a history of TIA previously. He denies any weakness or difficulty walking or dizziness. He has had associated sharp generalized headache which is without known aggravating or alleviating factors. He does have a history of migraine but states this does not feel the same. He has taken his Imitrex at home and Goody's powder without relief of his symptoms.  Past Medical History  Diagnosis Date  . Transient ischemic attack (TIA)   . Diabetes mellitus   . Bipolar 1 disorder   . Migraine   . Dyslipidemia   .  Emphysema   . PONV (postoperative nausea and vomiting)   . Shortness of breath   . Mental disorder     BIPOLAR DISORDER    Past Surgical History  Procedure Date  . Lumbar disc surgery   . Hemorroidectomy   . Tonsillectomy and adenoidectomy   . Knee arthroscopy     Family History  Problem Relation Age of Onset  . Diabetes type II    . Coronary artery disease    . Bipolar disorder      History  Substance Use Topics  . Smoking status: Current Some Day Smoker -- 30 years    Types: Cigarettes  . Smokeless tobacco: Current User    Types: Snuff  . Alcohol Use: No      Review of Systems  Constitutional: Negative for fever, chills, activity change and appetite change.  HENT: Negative for congestion, sore throat, trouble swallowing and neck pain.   Eyes: Negative for photophobia and visual disturbance.  Respiratory: Negative for shortness of breath.   Cardiovascular: Negative for chest pain and palpitations.  Gastrointestinal: Positive for abdominal pain. Negative for nausea, vomiting and diarrhea.  Genitourinary: Negative for dysuria.  Musculoskeletal: Negative for myalgias.  Skin: Negative for color change and rash.  Neurological: Negative for dizziness and weakness.  All other systems reviewed and are negative.    Allergies  Penicillins  Home Medications   Current Outpatient Rx  Name Route Sig Dispense Refill  . ASPIRIN 81 MG PO TABS Oral Take  4 tablets (324 mg total) by mouth daily. 100 tablet 0  . ATORVASTATIN CALCIUM 20 MG PO TABS Oral Take 20 mg by mouth daily.     Marland Kitchen NEPHROCAPS 1 MG PO CAPS Oral Take 1 capsule by mouth daily.    Marland Kitchen CLONAZEPAM 0.5 MG PO TABS Oral Take 0.5 mg by mouth 3 (three) times daily. 2 tabs in morning and 1 at night    . FAMOTIDINE 20 MG PO TABS Oral Take 20 mg by mouth daily.     Marland Kitchen GLIPIZIDE 10 MG PO TABS Oral Take 10 mg by mouth once.     Marland Kitchen LAMOTRIGINE 25 MG PO TABS Oral Take 50 mg by mouth 2 (two) times daily.     Marland Kitchen METFORMIN HCL 500  MG PO TABS Oral Take 500 mg by mouth 2 (two) times daily with a meal.      . METHYLPHENIDATE HCL 20 MG PO TBCR Oral Take 20 mg by mouth 3 (three) times daily. 2 Tabs in Morning and 1 at mid-day    . MULTI-VITAMIN/MINERALS PO TABS Oral Take 1 tablet by mouth daily.    Marland Kitchen RABEPRAZOLE SODIUM 20 MG PO TBEC Oral Take 20 mg by mouth daily.       BP 106/72  Pulse 111  Temp 99.6 F (37.6 C) (Oral)  Resp 18  SpO2 96%  Physical Exam  Nursing note and vitals reviewed. Constitutional: He appears well-developed and well-nourished. No distress.  HENT:  Head: Normocephalic and atraumatic.  Mouth/Throat: Oropharynx is clear and moist. No oropharyngeal exudate.  Eyes:       Normal appearance  Neck: Normal range of motion.  Cardiovascular: Normal rate, regular rhythm and normal heart sounds.   Pulmonary/Chest: Effort normal and breath sounds normal. He exhibits no tenderness.  Abdominal: Soft. Bowel sounds are normal. He exhibits no distension and no mass. There is tenderness in the right lower quadrant, suprapubic area and left lower quadrant. There is guarding. There is no rebound.    Musculoskeletal: Normal range of motion.  Neurological: He is alert.       Slight decreased sensation to lt touch to L side of face Speech clear, pupils equal round reactive to light, extraocular movements intact   Normal peripheral visual fields Cranial nerves III through XII normal including no facial droop Follows commands, moves all extremities x4, normal strength to bilateral upper and lower extremities at all major muscle groups including grip Sensation normal to light touch Coordination intact, no limb ataxia, finger-nose-finger normal Rapid alternating movements normal No pronator drift Gait normal  Skin: Skin is warm and dry. He is not diaphoretic.  Psychiatric: He has a normal mood and affect.    ED Course  Procedures (including critical care time)  Labs Reviewed  CBC WITH DIFFERENTIAL -  Abnormal; Notable for the following:    WBC 21.2 (*)     Neutrophils Relative 84 (*)     Neutro Abs 17.7 (*)     Lymphocytes Relative 10 (*)     Monocytes Absolute 1.1 (*)     All other components within normal limits  BASIC METABOLIC PANEL - Abnormal; Notable for the following:    Glucose, Bld 129 (*)     All other components within normal limits  URINALYSIS, ROUTINE W REFLEX MICROSCOPIC   Ct Head Wo Contrast  07/26/2012  *RADIOLOGY REPORT*  Clinical Data: Left facial numbness.  CT HEAD WITHOUT CONTRAST  Technique:  Contiguous axial images were obtained from the base of  the skull through the vertex without contrast.  Comparison: 04/19/2012  Findings: There is no evidence of acute intracranial hemorrhage, brain edema, mass lesion, acute infarction,   mass effect, or midline shift. Acute infarct may be inapparent on noncontrast CT. No other intra-axial abnormalities are seen, and the ventricles and sulci are within normal limits in size and symmetry.   No abnormal extra-axial fluid collections or masses are identified.  No significant calvarial abnormality.  IMPRESSION: 1. Negative for bleed or other acute intracranial process.  Original Report Authenticated By: Osa Craver, M.D.   Ct Abdomen Pelvis W Contrast  07/26/2012  *RADIOLOGY REPORT*  Clinical Data: Low abdominal pain.  CT ABDOMEN AND PELVIS WITH CONTRAST  Technique:  Multidetector CT imaging of the abdomen and pelvis was performed following the standard protocol during bolus administration of intravenous contrast.  Contrast: OMNIPAQUE IOHEXOL 300 MG/ML  SOLN  Comparison: 05/10/2010  Findings: Dependent atelectasis in the visualized lung bases. Stable sub centimeter probable hepatic cysts.  No new liver lesion. Unremarkable gallbladder, spleen, adrenal glands, kidneys, pancreas.  Patchy aortic plaque without aneurysm.  Portal vein patent.  Stomach and small bowel decompressed.  Appendix not discretely identified.  The colon is  nondilated.  There are scattered descending and sigmoid diverticula.  There are marked inflammatory/edematous changes around the distal sigmoid colon with a short segment of circumferential wall thickening, which is new since prior study.  No evidence of abscess.  There are a few regional prominent sub centimeter mesenteric lymph nodes.  Urinary bladder incompletely distended.  No ascites.  No free air. Degenerative disc disease L5-S1.  No hydronephrosis.  IMPRESSION:  1. Probable distal sigmoid diverticulitis without abscess. Given the regional   prominent mesenteric lymph nodes, follow-up recommended to exclude mucosal lesion.  Original Report Authenticated By: Thora Lance III, M.D.     1. Diverticulitis       MDM  Pt with abd pain, large leukocytosis of 21k. He is nontoxic appearing. TTP across lower abd. CT scan shows diverticulitis without perf or abscess. Given large leukocytosis, feel patient warrants admission for IV abx and continued hydration. Discussed with Dr. Toniann Fail with [facility] who accepts pt for admission.        Grant Fontana, PA-C 07/26/12 2231  Grant Fontana, PA-C 07/26/12 2302

## 2012-07-26 NOTE — ED Notes (Signed)
Pt alert, NAD, calm, itneractive. C/o mid lower abd buring/pain. Also reports fever. Last BM last night (loose), last ate 2d ago. Last PO intake (2 cups of contrast here). Denies nvd or obvious blood/bleeding. Ready for CT. Rates pain 9/10. Pain meds given.

## 2012-07-26 NOTE — ED Notes (Signed)
Preparing to take pt to 5500. Dr. Toniann Fail into room, at [facility].

## 2012-07-26 NOTE — ED Notes (Signed)
Pt c/o lower abd pain worse when has full bladder x 3 days; pt sts HA upon waking up this am

## 2012-07-26 NOTE — ED Notes (Signed)
EDPA into room, NAD, calm, interactive, updated, friends x2 at [facility].

## 2012-07-27 ENCOUNTER — Inpatient Hospital Stay (HOSPITAL_COMMUNITY): Payer: Self-pay

## 2012-07-27 DIAGNOSIS — K5732 Diverticulitis of large intestine without perforation or abscess without bleeding: Secondary | ICD-10-CM

## 2012-07-27 LAB — CBC WITH DIFFERENTIAL/PLATELET
Lymphocytes Relative: 12 % (ref 12–46)
Lymphs Abs: 1.8 10*3/uL (ref 0.7–4.0)
MCH: 28.3 pg (ref 26.0–34.0)
MCHC: 32.1 g/dL (ref 30.0–36.0)
MCV: 88.2 fL (ref 78.0–100.0)
Monocytes Absolute: 0.8 10*3/uL (ref 0.1–1.0)
Monocytes Relative: 5 % (ref 3–12)
Platelets: 227 10*3/uL (ref 150–400)
RDW: 14.4 % (ref 11.5–15.5)
WBC: 15.3 10*3/uL — ABNORMAL HIGH (ref 4.0–10.5)

## 2012-07-27 LAB — GLUCOSE, CAPILLARY
Glucose-Capillary: 100 mg/dL — ABNORMAL HIGH (ref 70–99)
Glucose-Capillary: 113 mg/dL — ABNORMAL HIGH (ref 70–99)
Glucose-Capillary: 189 mg/dL — ABNORMAL HIGH (ref 70–99)

## 2012-07-27 LAB — RAPID URINE DRUG SCREEN, HOSP PERFORMED
Amphetamines: NOT DETECTED
Barbiturates: NOT DETECTED
Benzodiazepines: NOT DETECTED
Cocaine: NOT DETECTED
Opiates: POSITIVE — AB
Tetrahydrocannabinol: NOT DETECTED

## 2012-07-27 LAB — LIPID PANEL
Cholesterol: 104 mg/dL (ref 0–200)
Total CHOL/HDL Ratio: 3.4 RATIO

## 2012-07-27 LAB — COMPREHENSIVE METABOLIC PANEL
Albumin: 3.2 g/dL — ABNORMAL LOW (ref 3.5–5.2)
Alkaline Phosphatase: 72 U/L (ref 39–117)
BUN: 13 mg/dL (ref 6–23)
Chloride: 104 mEq/L (ref 96–112)
Potassium: 3.5 mEq/L (ref 3.5–5.1)
Total Bilirubin: 0.5 mg/dL (ref 0.3–1.2)

## 2012-07-27 LAB — HEMOGLOBIN A1C: Mean Plasma Glucose: 140 mg/dL — ABNORMAL HIGH (ref <117)

## 2012-07-27 MED ORDER — ATORVASTATIN CALCIUM 20 MG PO TABS
20.0000 mg | ORAL_TABLET | Freq: Every day | ORAL | Status: DC
  Administered 2012-07-27 – 2012-07-28 (×2): 20 mg via ORAL
  Filled 2012-07-27 (×3): qty 1

## 2012-07-27 MED ORDER — HYDROMORPHONE HCL PF 1 MG/ML IJ SOLN: 1.0000 mg | INTRAMUSCULAR | Status: DC | PRN

## 2012-07-27 MED ORDER — METRONIDAZOLE 500 MG PO TABS
500.0000 mg | ORAL_TABLET | Freq: Three times a day (TID) | ORAL | Status: DC
  Administered 2012-07-27 – 2012-07-29 (×7): 500 mg via ORAL
  Filled 2012-07-27 (×9): qty 1

## 2012-07-27 MED ORDER — PANTOPRAZOLE SODIUM 40 MG PO TBEC
40.0000 mg | DELAYED_RELEASE_TABLET | Freq: Every day | ORAL | Status: DC
  Administered 2012-07-27 – 2012-07-29 (×3): 40 mg via ORAL
  Filled 2012-07-27 (×3): qty 1

## 2012-07-27 MED ORDER — LIDOCAINE 5 % EX PTCH: 1.0000 | MEDICATED_PATCH | Freq: Two times a day (BID) | CUTANEOUS | Status: DC | PRN

## 2012-07-27 MED ORDER — SODIUM CHLORIDE 0.9 % IV SOLN
INTRAVENOUS | Status: DC
  Administered 2012-07-27 – 2012-07-28 (×2): via INTRAVENOUS

## 2012-07-27 MED ORDER — LAMOTRIGINE 200 MG PO TABS
200.0000 mg | ORAL_TABLET | Freq: Once | ORAL | Status: AC
  Administered 2012-07-27: 200 mg via ORAL
  Filled 2012-07-27: qty 1

## 2012-07-27 MED ORDER — CIPROFLOXACIN IN D5W 400 MG/200ML IV SOLN
400.0000 mg | Freq: Two times a day (BID) | INTRAVENOUS | Status: DC
  Administered 2012-07-27: 400 mg via INTRAVENOUS
  Filled 2012-07-27 (×3): qty 200

## 2012-07-27 MED ORDER — LIDOCAINE 5 % EX PTCH
1.0000 | MEDICATED_PATCH | CUTANEOUS | Status: DC
  Administered 2012-07-27 – 2012-07-29 (×3): 1 via TRANSDERMAL
  Filled 2012-07-27 (×3): qty 1

## 2012-07-27 MED ORDER — SODIUM CHLORIDE 0.9 % IV SOLN
INTRAVENOUS | Status: AC
  Administered 2012-07-27: via INTRAVENOUS

## 2012-07-27 MED ORDER — METRONIDAZOLE IN NACL 5-0.79 MG/ML-% IV SOLN
500.0000 mg | Freq: Three times a day (TID) | INTRAVENOUS | Status: DC
  Administered 2012-07-27: 500 mg via INTRAVENOUS
  Filled 2012-07-27 (×3): qty 100

## 2012-07-27 MED ORDER — METHYLPHENIDATE HCL 20 MG PO TBCR: 20.0000 mg | EXTENDED_RELEASE_TABLET | Freq: Three times a day (TID) | ORAL | Status: DC

## 2012-07-27 MED ORDER — LAMOTRIGINE 25 MG PO TABS
50.0000 mg | ORAL_TABLET | Freq: Two times a day (BID) | ORAL | Status: DC
  Administered 2012-07-27 – 2012-07-29 (×5): 50 mg via ORAL
  Filled 2012-07-27 (×7): qty 2

## 2012-07-27 MED ORDER — HYDROMORPHONE HCL PF 1 MG/ML IJ SOLN
1.0000 mg | INTRAMUSCULAR | Status: DC | PRN
  Administered 2012-07-27 – 2012-07-29 (×6): 1 mg via INTRAVENOUS
  Filled 2012-07-27 (×6): qty 1

## 2012-07-27 MED ORDER — METRONIDAZOLE IN NACL 5-0.79 MG/ML-% IV SOLN: 500.0000 mg | Freq: Three times a day (TID) | INTRAVENOUS | Status: DC

## 2012-07-27 MED ORDER — CLONAZEPAM 0.5 MG PO TABS
0.5000 mg | ORAL_TABLET | Freq: Three times a day (TID) | ORAL | Status: DC
  Administered 2012-07-27 – 2012-07-29 (×7): 0.5 mg via ORAL
  Filled 2012-07-27 (×7): qty 1

## 2012-07-27 MED ORDER — FAMOTIDINE 20 MG PO TABS
20.0000 mg | ORAL_TABLET | Freq: Every day | ORAL | Status: DC
Start: 2012-07-27 — End: 2012-07-29
  Administered 2012-07-27 – 2012-07-29 (×3): 20 mg via ORAL
  Filled 2012-07-27 (×3): qty 1

## 2012-07-27 MED ORDER — CIPROFLOXACIN HCL 500 MG PO TABS
500.0000 mg | ORAL_TABLET | Freq: Two times a day (BID) | ORAL | Status: DC
  Administered 2012-07-27 – 2012-07-29 (×5): 500 mg via ORAL
  Filled 2012-07-27 (×8): qty 1

## 2012-07-27 MED ORDER — ONDANSETRON HCL 4 MG/2ML IJ SOLN: 4.0000 mg | Freq: Three times a day (TID) | INTRAMUSCULAR | Status: AC | PRN

## 2012-07-27 NOTE — Consult Note (Signed)
Agree with abx Would not feed till pain is better No indication for surgery at this point

## 2012-07-27 NOTE — Progress Notes (Signed)
[facility] PROGRESS NOTE  LIGE LAKEMAN UJW:[id number] DOB: [birth year] DOA: 07/26/2012   Assessment/Plan:  Diverticulitis (07/26/2012) -patient abdominal pain much improved he like to try a diet.  -We'll go ahead and change his antibiotics to oral.  Left facial numbness (07/26/2012) - resolved no asymmetry.unlikely a stroke. -Unlikely facial palsy.   DM2 (diabetes mellitus, type 2) (07/26/2012) -  Good control continue current treatment. -Continue him on sliding scale insulin for today. Start metformin tomorrow morning. He recently got IV contrast  Code Status: Full code  Family Communication: Patient  Disposition Plan:  probably home tomorrow if continued to improve the way she is doing to    LOS: 1 day    Subjective: patient relates he feels much better. His abdominal pain is almost resolved. He would like to try the diet today. Has had no further fevers. Passing gas and having bowel movements.   Objective: Filed Vitals:   07/26/12 2014 07/26/12 2232 07/26/12 2351 07/27/12 0653  BP:  123/77 127/78 119/67  Pulse:  103 86 82  Temp: 99.6 F (37.6 C) 99.9 F (37.7 C) 98.6 F (37 C) 98.9 F (37.2 C)  TempSrc: Oral Oral Oral Oral  Resp:  16 16 18   SpO2:  95% 96% 97%    Intake/Output Summary (Last 24 hours) at 07/27/12 0946 Last data filed at 07/27/12 0600  Gross per 24 hour  Intake   3125 ml  Output      0 ml  Net   3125 ml   Weight change:   Exam:  General: Alert, awake, oriented x3, in no acute distress.  HEENT: No bruits, no goiter.  Heart: Regular rate and rhythm, without murmurs, rubs, gallops.  Lungs: Good air movement,  clear to auscultation Abdomen: Soft,  mild tenderness in the left lower quadrant no rebound or guarding, nondistended, positive bowel sounds.  Neuro: Grossly intact, nonfocal.   Data Reviewed: Basic Metabolic Panel:  Lab 07/27/12 5621 07/26/12 1439  NA 138 139  K 3.5 3.7  CL 104 102  CO2 25 24  GLUCOSE 94 129*  BUN 13  13  CREATININE 0.82 0.98  CALCIUM 8.3* 9.7  MG -- --  PHOS -- --   Liver Function Tests:  Lab 07/27/12 0549  AST 8  ALT 10  ALKPHOS 72  BILITOT 0.5  PROT 6.0  ALBUMIN 3.2*   No results found for this basename: LIPASE:5,AMYLASE:5 in the last 168 hours No results found for this basename: AMMONIA:5 in the last 168 hours CBC:  Lab 07/27/12 0549 07/26/12 1439  WBC 15.3* 21.2*  NEUTROABS 12.4* 17.7*  HGB 11.8* 14.3  HCT 36.8* 44.4  MCV 88.2 87.6  PLT 227 281   Cardiac Enzymes: No results found for this basename: CKTOTAL:5,CKMB:5,CKMBINDEX:5,TROPONINI:5 in the last 168 hours BNP: No components found with this basename: POCBNP:5 CBG:  Lab 07/27/12 0757  GLUCAP 103*    No results found for this or any previous visit (from the past 240 hour(s)).   Studies: Ct Head Wo Contrast  07/26/2012  *RADIOLOGY REPORT*  Clinical Data: Left facial numbness.  CT HEAD WITHOUT CONTRAST  Technique:  Contiguous axial images were obtained from the base of the skull through the vertex without contrast.  Comparison: 04/19/2012  Findings: There is no evidence of acute intracranial hemorrhage, brain edema, mass lesion, acute infarction,   mass effect, or midline shift. Acute infarct may be inapparent on noncontrast CT. No other intra-axial abnormalities are seen, and the ventricles and sulci are within  normal limits in size and symmetry.   No abnormal extra-axial fluid collections or masses are identified.  No significant calvarial abnormality.  IMPRESSION: 1. Negative for bleed or other acute intracranial process.  Original Report Authenticated By: Osa Craver, M.D.   Mri Brain Without Contrast  07/27/2012  *RADIOLOGY REPORT*  Clinical Data: Headache with left-sided facial numbness. Diabetic patient with dyslipidemia.  History of migraine headaches.  MRI HEAD WITHOUT CONTRAST  Technique:  Multiplanar, multiecho pulse sequences of the brain and surrounding structures were obtained according to  standard protocol without intravenous contrast.  Comparison: 07/26/2012 CT.  04/19/2012 MR.  Findings: No acute infarct.  No intracranial hemorrhage.  No intracranial mass lesion detected on this unenhanced exam.  Scattered nonspecific white matter type changes similar to the prior exam which may represent result of small vessel disease in this patient with diabetes and hyperlipidemia.  Result migraine headaches not excluded.  Other considerations for white matter type changes not entirely excluded although felt to be less likely include that secondary to; demyelinating process, inflammatory process, vasculitis or prior trauma.  Polypoid opacification in the left maxillary sinus.  Major intracranial vascular structures are patent.  IMPRESSION: No acute infarct.  Nonspecific white matter type changes as noted above.  Original Report Authenticated By: Fuller Canada, M.D.   Ct Abdomen Pelvis W Contrast  07/26/2012  *RADIOLOGY REPORT*  Clinical Data: Low abdominal pain.  CT ABDOMEN AND PELVIS WITH CONTRAST  Technique:  Multidetector CT imaging of the abdomen and pelvis was performed following the standard protocol during bolus administration of intravenous contrast.  Contrast: OMNIPAQUE IOHEXOL 300 MG/ML  SOLN  Comparison: 05/10/2010  Findings: Dependent atelectasis in the visualized lung bases. Stable sub centimeter probable hepatic cysts.  No new liver lesion. Unremarkable gallbladder, spleen, adrenal glands, kidneys, pancreas.  Patchy aortic plaque without aneurysm.  Portal vein patent.  Stomach and small bowel decompressed.  Appendix not discretely identified.  The colon is nondilated.  There are scattered descending and sigmoid diverticula.  There are marked inflammatory/edematous changes around the distal sigmoid colon with a short segment of circumferential wall thickening, which is new since prior study.  No evidence of abscess.  There are a few regional prominent sub centimeter mesenteric lymph  nodes.  Urinary bladder incompletely distended.  No ascites.  No free air. Degenerative disc disease L5-S1.  No hydronephrosis.  IMPRESSION:  1. Probable distal sigmoid diverticulitis without abscess. Given the regional   prominent mesenteric lymph nodes, follow-up recommended to exclude mucosal lesion.  Original Report Authenticated By: Thora Lance III, M.D.    Scheduled Meds:   . sodium chloride   Intravenous STAT  . atorvastatin  20 mg Oral q1800  . ciprofloxacin  400 mg Intravenous BID  . clonazePAM  0.5 mg Oral TID  . famotidine  20 mg Oral Daily  .  HYDROmorphone (DILAUDID) injection  1 mg Intravenous Once  .  HYDROmorphone (DILAUDID) injection  1 mg Intravenous Once  . lamoTRIgine  200 mg Oral Once  . lamoTRIgine  50 mg Oral BID  . metronidazole  500 mg Intravenous Once  . metronidazole  500 mg Intravenous Q8H  . ondansetron (ZOFRAN) IV  4 mg Intravenous Once  . pantoprazole  40 mg Oral Q1200  . sodium chloride  1,000 mL Intravenous Once  . sodium chloride  1,000 mL Intravenous Once  . DISCONTD: sodium chloride   Intravenous Once  . DISCONTD: ciprofloxacin  400 mg Intravenous Once  . DISCONTD:  methylphenidate  20 mg Oral TID  . DISCONTD: metronidazole  500 mg Intravenous Q8H   Continuous Infusions:   . sodium chloride 125 mL/hr at 07/27/12 0600    Lambert Keto, MD  [facility] Pager [phone]  If 7PM-7AM, please contact night-coverage [email or web address] Password [facility] 07/27/2012, 9:46 AM

## 2012-07-27 NOTE — Consult Note (Signed)
Reason for Consult: Abdominal pain Referring Physician:KAKRAKANDY,ARSHAD N.   Jerry Shaffer is an 51 y.o. male.  HPI: with history of diabetes mellitus type 2, bipolar disorder, ongoing tobacco abuse, history of migraine and TIA has been experiencing abdominal pain last 2 days. The pain started off yesterday morning when he woke up and it was initially in the epigastric area and soon the pain moved to the both lower quadrants and suprapubic. He did not eat much for the fear of pain. He did have one loose bowel movement yesterday. He denies using any antibiotics recently. CT of the abdomen and pelvis shows possibility of diverticulitis involving the sigmoid area. Patient did have a colonoscopy last year per the patient's report; only finding per patient was a benign polyp. No Hx of hematochezia or melena.    Past Medical History  Diagnosis Date  . Transient ischemic attack (TIA)   . Diabetes mellitus   . Bipolar 1 disorder   . Migraine   . Dyslipidemia   . Emphysema   . PONV (postoperative nausea and vomiting)   . Shortness of breath   . Mental disorder     BIPOLAR DISORDER    Past Surgical History  Procedure Date  . Lumbar disc surgery   . Hemorroidectomy   . Tonsillectomy and adenoidectomy   . Knee arthroscopy     Family History  Problem Relation Age of Onset  . Diabetes type II    . Coronary artery disease    . Bipolar disorder    . Lung cancer Father     Social History:  reports that he has been smoking Cigarettes.  He has smoked for the past 30 years. His smokeless tobacco use includes Snuff. He reports that he does not drink alcohol or use illicit drugs.  Allergies:  Allergies  Allergen Reactions  . Penicillins     REACTION: anaphylaxis    Medications: I have reviewed the patient's current medications.  Results for orders placed during the hospital encounter of 07/26/12 (from the past 48 hour(s))  CBC WITH DIFFERENTIAL     Status: Abnormal   Collection Time     07/26/12  2:39 PM      Component Value Range Comment   WBC 21.2 (*) 4.0 - 10.5 K/uL    RBC 5.07  4.22 - 5.81 MIL/uL    Hemoglobin 14.3  13.0 - 17.0 g/dL    HCT 14.7  82.9 - 56.2 %    MCV 87.6  78.0 - 100.0 fL    MCH 28.2  26.0 - 34.0 pg    MCHC 32.2  30.0 - 36.0 g/dL    RDW 13.0  86.5 - 78.4 %    Platelets 281  150 - 400 K/uL    Neutrophils Relative 84 (*) 43 - 77 %    Neutro Abs 17.7 (*) 1.7 - 7.7 K/uL    Lymphocytes Relative 10 (*) 12 - 46 %    Lymphs Abs 2.1  0.7 - 4.0 K/uL    Monocytes Relative 5  3 - 12 %    Monocytes Absolute 1.1 (*) 0.1 - 1.0 K/uL    Eosinophils Relative 1  0 - 5 %    Eosinophils Absolute 0.2  0.0 - 0.7 K/uL    Basophils Relative 0  0 - 1 %    Basophils Absolute 0.1  0.0 - 0.1 K/uL   BASIC METABOLIC PANEL     Status: Abnormal   Collection Time   07/26/12  2:39 PM      Component Value Range Comment   Sodium 139  135 - 145 mEq/L    Potassium 3.7  3.5 - 5.1 mEq/L    Chloride 102  96 - 112 mEq/L    CO2 24  19 - 32 mEq/L    Glucose, Bld 129 (*) 70 - 99 mg/dL    BUN 13  6 - 23 mg/dL    Creatinine, Ser 1.61  0.50 - 1.35 mg/dL    Calcium 9.7  8.4 - 09.6 mg/dL    GFR calc non Af Amer >90  >90 mL/min    GFR calc Af Amer >90  >90 mL/min   URINALYSIS, ROUTINE W REFLEX MICROSCOPIC     Status: Normal   Collection Time   07/26/12  9:40 PM      Component Value Range Comment   Color, Urine YELLOW  YELLOW    APPearance CLEAR  CLEAR    Specific Gravity, Urine 1.009  1.005 - 1.030    pH 6.0  5.0 - 8.0    Glucose, UA NEGATIVE  NEGATIVE mg/dL    Hgb urine dipstick NEGATIVE  NEGATIVE    Bilirubin Urine NEGATIVE  NEGATIVE    Ketones, ur NEGATIVE  NEGATIVE mg/dL    Protein, ur NEGATIVE  NEGATIVE mg/dL    Urobilinogen, UA 0.2  0.0 - 1.0 mg/dL    Nitrite NEGATIVE  NEGATIVE    Leukocytes, UA NEGATIVE  NEGATIVE MICROSCOPIC NOT DONE ON URINES WITH NEGATIVE PROTEIN, BLOOD, LEUKOCYTES, NITRITE, OR GLUCOSE <1000 mg/dL.  URINE RAPID DRUG SCREEN (HOSP PERFORMED)     Status:  Abnormal   Collection Time   07/27/12  2:32 AM      Component Value Range Comment   Opiates POSITIVE (*) NONE DETECTED    Cocaine NONE DETECTED  NONE DETECTED    Benzodiazepines NONE DETECTED  NONE DETECTED    Amphetamines NONE DETECTED  NONE DETECTED    Tetrahydrocannabinol NONE DETECTED  NONE DETECTED    Barbiturates NONE DETECTED  NONE DETECTED   COMPREHENSIVE METABOLIC PANEL     Status: Abnormal   Collection Time   07/27/12  5:49 AM      Component Value Range Comment   Sodium 138  135 - 145 mEq/L    Potassium 3.5  3.5 - 5.1 mEq/L    Chloride 104  96 - 112 mEq/L    CO2 25  19 - 32 mEq/L    Glucose, Bld 94  70 - 99 mg/dL    BUN 13  6 - 23 mg/dL    Creatinine, Ser 0.45  0.50 - 1.35 mg/dL    Calcium 8.3 (*) 8.4 - 10.5 mg/dL    Total Protein 6.0  6.0 - 8.3 g/dL    Albumin 3.2 (*) 3.5 - 5.2 g/dL    AST 8  0 - 37 U/L    ALT 10  0 - 53 U/L    Alkaline Phosphatase 72  39 - 117 U/L    Total Bilirubin 0.5  0.3 - 1.2 mg/dL    GFR calc non Af Amer >90  >90 mL/min    GFR calc Af Amer >90  >90 mL/min   CBC WITH DIFFERENTIAL     Status: Abnormal   Collection Time   07/27/12  5:49 AM      Component Value Range Comment   WBC 15.3 (*) 4.0 - 10.5 K/uL    RBC 4.17 (*) 4.22 - 5.81 MIL/uL  Hemoglobin 11.8 (*) 13.0 - 17.0 g/dL DELTA CHECK NOTED   HCT 36.8 (*) 39.0 - 52.0 %    MCV 88.2  78.0 - 100.0 fL    MCH 28.3  26.0 - 34.0 pg    MCHC 32.1  30.0 - 36.0 g/dL    RDW 16.1  09.6 - 04.5 %    Platelets 227  150 - 400 K/uL    Neutrophils Relative 81 (*) 43 - 77 %    Neutro Abs 12.4 (*) 1.7 - 7.7 K/uL    Lymphocytes Relative 12  12 - 46 %    Lymphs Abs 1.8  0.7 - 4.0 K/uL    Monocytes Relative 5  3 - 12 %    Monocytes Absolute 0.8  0.1 - 1.0 K/uL    Eosinophils Relative 2  0 - 5 %    Eosinophils Absolute 0.3  0.0 - 0.7 K/uL    Basophils Relative 0  0 - 1 %    Basophils Absolute 0.0  0.0 - 0.1 K/uL   LACTIC ACID, PLASMA     Status: Abnormal   Collection Time   07/27/12  5:49 AM       Component Value Range Comment   Lactic Acid, Venous 0.4 (*) 0.5 - 2.2 mmol/L   LIPID PANEL     Status: Abnormal   Collection Time   07/27/12  5:49 AM      Component Value Range Comment   Cholesterol 104  0 - 200 mg/dL    Triglycerides 89  <409 mg/dL    HDL 31 (*) >81 mg/dL    Total CHOL/HDL Ratio 3.4      VLDL 18  0 - 40 mg/dL    LDL Cholesterol 55  0 - 99 mg/dL   GLUCOSE, CAPILLARY     Status: Abnormal   Collection Time   07/27/12  7:57 AM      Component Value Range Comment   Glucose-Capillary 103 (*) 70 - 99 mg/dL     Ct Head Wo Contrast  07/26/2012  *RADIOLOGY REPORT*  Clinical Data: Left facial numbness.  CT HEAD WITHOUT CONTRAST  Technique:  Contiguous axial images were obtained from the base of the skull through the vertex without contrast.  Comparison: 04/19/2012  Findings: There is no evidence of acute intracranial hemorrhage, brain edema, mass lesion, acute infarction,   mass effect, or midline shift. Acute infarct may be inapparent on noncontrast CT. No other intra-axial abnormalities are seen, and the ventricles and sulci are within normal limits in size and symmetry.   No abnormal extra-axial fluid collections or masses are identified.  No significant calvarial abnormality.  IMPRESSION: 1. Negative for bleed or other acute intracranial process.  Original Report Authenticated By: Osa Craver, M.D.   Mri Brain Without Contrast  07/27/2012  *RADIOLOGY REPORT*  Clinical Data: Headache with left-sided facial numbness. Diabetic patient with dyslipidemia.  History of migraine headaches.  MRI HEAD WITHOUT CONTRAST  Technique:  Multiplanar, multiecho pulse sequences of the brain and surrounding structures were obtained according to standard protocol without intravenous contrast.  Comparison: 07/26/2012 CT.  04/19/2012 MR.  Findings: No acute infarct.  No intracranial hemorrhage.  No intracranial mass lesion detected on this unenhanced exam.  Scattered nonspecific white matter type  changes similar to the prior exam which may represent result of small vessel disease in this patient with diabetes and hyperlipidemia.  Result migraine headaches not excluded.  Other considerations for white matter type changes not  entirely excluded although felt to be less likely include that secondary to; demyelinating process, inflammatory process, vasculitis or prior trauma.  Polypoid opacification in the left maxillary sinus.  Major intracranial vascular structures are patent.  IMPRESSION: No acute infarct.  Nonspecific white matter type changes as noted above.  Original Report Authenticated By: Fuller Canada, M.D.   Ct Abdomen Pelvis W Contrast  07/26/2012  *RADIOLOGY REPORT*  Clinical Data: Low abdominal pain.  CT ABDOMEN AND PELVIS WITH CONTRAST  Technique:  Multidetector CT imaging of the abdomen and pelvis was performed following the standard protocol during bolus administration of intravenous contrast.  Contrast: OMNIPAQUE IOHEXOL 300 MG/ML  SOLN  Comparison: 05/10/2010  Findings: Dependent atelectasis in the visualized lung bases. Stable sub centimeter probable hepatic cysts.  No new liver lesion. Unremarkable gallbladder, spleen, adrenal glands, kidneys, pancreas.  Patchy aortic plaque without aneurysm.  Portal vein patent.  Stomach and small bowel decompressed.  Appendix not discretely identified.  The colon is nondilated.  There are scattered descending and sigmoid diverticula.  There are marked inflammatory/edematous changes around the distal sigmoid colon with a short segment of circumferential wall thickening, which is new since prior study.  No evidence of abscess.  There are a few regional prominent sub centimeter mesenteric lymph nodes.  Urinary bladder incompletely distended.  No ascites.  No free air. Degenerative disc disease L5-S1.  No hydronephrosis.  IMPRESSION:  1. Probable distal sigmoid diverticulitis without abscess. Given the regional   prominent mesenteric lymph nodes,  follow-up recommended to exclude mucosal lesion.  Original Report Authenticated By: Osa Craver, M.D.    Review of Systems  Constitutional: Negative.   HENT: Negative.   Eyes: Negative.   Respiratory: Negative.   Gastrointestinal: Positive for abdominal pain.  Genitourinary: Negative.   Musculoskeletal: Negative.   Skin: Negative.   Neurological: Positive for sensory change.  Endo/Heme/Allergies: Negative.   Psychiatric/Behavioral: Negative.    Blood pressure 119/67, pulse 82, temperature 98.9 F (37.2 C), temperature source Oral, resp. rate 18, SpO2 97.00%. Physical Exam  Constitutional: He is oriented to person, place, and time. He appears well-developed and well-nourished. No distress.  HENT:  Head: Normocephalic and atraumatic.  Mouth/Throat: No oropharyngeal exudate.  Eyes: Conjunctivae and EOM are normal. Pupils are equal, round, and reactive to light. Right eye exhibits no discharge. Left eye exhibits no discharge. No scleral icterus.  Neck: Normal range of motion. Neck supple. No JVD present. No tracheal deviation present. No thyromegaly present.  Cardiovascular: Normal rate, regular rhythm, normal heart sounds and intact distal pulses.  Exam reveals no gallop and no friction rub.   No murmur heard. Respiratory: Effort normal and breath sounds normal. No stridor. No respiratory distress. He has no wheezes. He has no rales. He exhibits no tenderness.  GI: Soft. Bowel sounds are normal. He exhibits no distension and no mass. There is tenderness. There is no rebound and no guarding.    Musculoskeletal: Normal range of motion. He exhibits no edema and no tenderness.  Lymphadenopathy:    He has no cervical adenopathy.  Neurological: He is alert and oriented to person, place, and time.  Skin: Skin is warm and dry. No rash noted. He is not diaphoretic. No erythema. No pallor.  Psychiatric: He has a normal mood and affect.    Assessment/Plan:  Acute  diverticulitis. Agree with continuing conservative management with Cipro Flagyl, NPO except for meds, IVF. Do not think there is need for surgical intervention at this time.  Thank you for this consult, we will continue to follow with you.   Chad Tiznado 07/27/2012, 9:02 AM

## 2012-07-27 NOTE — ED Provider Notes (Signed)
Medical screening examination/treatment/procedure(s) were performed by non-physician practitioner and as supervising physician I was immediately available for consultation/collaboration.  Geoffery Lyons, MD 07/27/12 [phone]

## 2012-07-27 NOTE — Care Management Note (Addendum)
    Page 1 of 1   07/29/2012     3:16:27 PM   CARE MANAGEMENT NOTE 07/29/2012  Patient:  Jerry Shaffer, Jerry Shaffer   Account Number:  [id number]  Date Initiated:  07/27/2012  Documentation initiated by:  Letha Cape  Subjective/Objective Assessment:   dx diverticulitis  admit- lives with friends. pta independent.     Action/Plan:   Anticipated DC Date:  07/29/2012   Anticipated DC Plan:  HOME/SELF CARE      DC Planning Services  CM consult  Medication Assistance      Choice offered to / List presented to:             Status of service:  Completed, signed off Medicare Important Message given?   (If response is "NO", the following Medicare IM given date fields will be blank) Date Medicare IM given:   Date Additional Medicare IM given:    Discharge Disposition:  HOME/SELF CARE  Per UR Regulation:  Reviewed for med. necessity/level of care/duration of stay  If discussed at Long Length of Stay Meetings, dates discussed:    Comments:  07/29/12 15:14 Letha Cape RN, BSN [phone] pt for dc , helped with med ast with abx.   07/28/12 14:16 Letha Cape RN, BSN [phone] patient is for dc tomorrow , patient is having a lot of abd pain, back on clears, receiving iv dilaudid.  07/27/12 13:55 Letha Cape RN, BSN [phone] patient lives with friend who is room mate, pta independent.  Patient is eligible for med ast if needed. Patient has transportation.   Patient states he was going to [facility] and he has a orange card for his medications.  Patient states he was instructed by [facility] to go to [facility], no needs anticiapted.  NCM will continue to follow for dc.

## 2012-07-27 NOTE — Progress Notes (Signed)
Jerry Shaffer [id number] Transfer Data: 07/27/2012 2:48 AM Attending Provider: Marinda Elk, MD MVH:QIONGEX,BMWUXLKG, MD Code Status: Full   Jerry Shaffer is a 51 y.o. male patient admitted from ED  No acute distress noted.  No c/o shortness of breath, no c/o chest pain.  Cardiac tele # [phone], in place, cardiac monitor yields:normal sinus rhythm.  Blood pressure 127/78, pulse 86, temperature 98.6 F (37 C), temperature source Oral, resp. rate 16, SpO2 96.00%.   IV Fluids:  IV in place, occlusive dsg intact without redness, IV cath antecubital left, condition patent and no redness normal saline.   Allergies:  Penicillins  Past Medical History:   has a past medical history of Transient ischemic attack (TIA); Diabetes mellitus; Bipolar 1 disorder; Migraine; Dyslipidemia; Emphysema; PONV (postoperative nausea and vomiting); Shortness of breath; and Mental disorder.  Past Surgical History:   has past surgical history that includes Lumbar disc surgery; Hemorroidectomy; Tonsillectomy and adenoidectomy; and Knee arthroscopy.  Social History:   reports that he has been smoking Cigarettes.  He has smoked for the past 30 years. His smokeless tobacco use includes Snuff. He reports that he does not drink alcohol or use illicit drugs.  Skin: Intacth  Orientation to room, and floor completed with information packet given to patient/family. Admission INP armband ID verified with patient/family, and in place.   SR up x 2, fall assessment complete, with patient and family able to verbalize understanding of risk associated with falls, and verbalized understanding to call for assistance before getting out of bed.   Call light within reach. Patient able to voice and demonstrate understanding of unit orientation instructions.   Will cont to eval and treat per MD orders.  Eulogio Ditch, RN, [facility] 07/27/2012 2:48 AM

## 2012-07-28 DIAGNOSIS — F909 Attention-deficit hyperactivity disorder, unspecified type: Secondary | ICD-10-CM

## 2012-07-28 DIAGNOSIS — R1032 Left lower quadrant pain: Secondary | ICD-10-CM

## 2012-07-28 LAB — GLUCOSE, CAPILLARY
Glucose-Capillary: 122 mg/dL — ABNORMAL HIGH (ref 70–99)
Glucose-Capillary: 142 mg/dL — ABNORMAL HIGH (ref 70–99)

## 2012-07-28 LAB — CBC
MCHC: 32.1 g/dL (ref 30.0–36.0)
RDW: 14.2 % (ref 11.5–15.5)

## 2012-07-28 MED ORDER — ACETAMINOPHEN 325 MG PO TABS: 650.0000 mg | ORAL_TABLET | Freq: Four times a day (QID) | ORAL | Status: DC | PRN

## 2012-07-28 NOTE — Progress Notes (Signed)
[facility] PROGRESS NOTE  Jerry Shaffer ZOX:[id number] DOB: [birth year] DOA: 07/26/2012   Assessment/Plan:  Diverticulitis (07/26/2012) -patient admitted with acute diverticulitis on 8/12 without abscess. Abdominal pain much improved by 8/13 and he was started on a regular diet. Initially treated with iv antibiotics changed to po abx on 8/13. On August 14 the abdominal pain was still present. The diet was scaled back to clear liquids . Patient continues to require IV analgesia .   Left facial numbness (07/26/2012) - resolved no asymmetry.unlikely a stroke. -Unlikely facial palsy.   DM2 (diabetes mellitus, type 2) (07/26/2012) -  Good control continue current treatment. -Continue him on sliding scale insulin while inpatient   Code Status: Full code  Family Communication: Patient  Disposition Plan:  home    LOS: 2 days    Subjective: Severe abdominal pain when passing flatus or bowel movements  Objective: Filed Vitals:   07/27/12 1542 07/27/12 2113 07/28/12 0514 07/28/12 0950  BP: 141/82 138/81 101/63 120/67  Pulse: 89 94 86 85  Temp: 98.4 F (36.9 C) 98.8 F (37.1 C) 98.6 F (37 C) 98.2 F (36.8 C)  TempSrc: Oral Oral Oral Oral  Resp: 18 18 20 20   Height:      Weight:      SpO2: 96% 95% 95% 97%    Intake/Output Summary (Last 24 hours) at 07/28/12 1218 Last data filed at 07/28/12 1011  Gross per 24 hour  Intake 3559.58 ml  Output      0 ml  Net 3559.58 ml   Weight change:   Exam: Alert and oriented x3 Chest clear to auscultation Heart regular without murmurs rubs gallops Left lower quadrant abdomen with tenderness, no rebound no guarding   Data Reviewed: Basic Metabolic Panel:  Lab 07/27/12 8119 07/26/12 1439  NA 138 139  K 3.5 3.7  CL 104 102  CO2 25 24  GLUCOSE 94 129*  BUN 13 13  CREATININE 0.82 0.98  CALCIUM 8.3* 9.7  MG -- --  PHOS -- --   Liver Function Tests:  Lab 07/27/12 0549  AST 8  ALT 10  ALKPHOS 72  BILITOT 0.5  PROT  6.0  ALBUMIN 3.2*   No results found for this basename: LIPASE:5,AMYLASE:5 in the last 168 hours No results found for this basename: AMMONIA:5 in the last 168 hours CBC:  Lab 07/28/12 0847 07/27/12 0549 07/26/12 1439  WBC 11.7* 15.3* 21.2*  NEUTROABS -- 12.4* 17.7*  HGB 11.7* 11.8* 14.3  HCT 36.4* 36.8* 44.4  MCV 87.7 88.2 87.6  PLT 254 227 281   Cardiac Enzymes: No results found for this basename: CKTOTAL:5,CKMB:5,CKMBINDEX:5,TROPONINI:5 in the last 168 hours BNP: No components found with this basename: POCBNP:5 CBG:  Lab 07/28/12 1145 07/28/12 0752 07/27/12 2120 07/27/12 1721 07/27/12 1216  GLUCAP 142* 122* 189* 113* 100*    Recent Results (from the past 240 hour(s))  CLOSTRIDIUM DIFFICILE BY PCR     Status: Normal   Collection Time   07/27/12 11:19 AM      Component Value Range Status Comment   C difficile by pcr NEGATIVE  NEGATIVE Final      Studies: Ct Head Wo Contrast  07/26/2012  *RADIOLOGY REPORT*  Clinical Data: Left facial numbness.  CT HEAD WITHOUT CONTRAST  Technique:  Contiguous axial images were obtained from the base of the skull through the vertex without contrast.  Comparison: 04/19/2012  Findings: There is no evidence of acute intracranial hemorrhage, brain edema, mass lesion, acute infarction,  mass effect, or midline shift. Acute infarct may be inapparent on noncontrast CT. No other intra-axial abnormalities are seen, and the ventricles and sulci are within normal limits in size and symmetry.   No abnormal extra-axial fluid collections or masses are identified.  No significant calvarial abnormality.  IMPRESSION: 1. Negative for bleed or other acute intracranial process.  Original Report Authenticated By: Osa Craver, M.D.   Mri Brain Without Contrast  07/27/2012  *RADIOLOGY REPORT*  Clinical Data: Headache with left-sided facial numbness. Diabetic patient with dyslipidemia.  History of migraine headaches.  MRI HEAD WITHOUT CONTRAST  Technique:   Multiplanar, multiecho pulse sequences of the brain and surrounding structures were obtained according to standard protocol without intravenous contrast.  Comparison: 07/26/2012 CT.  04/19/2012 MR.  Findings: No acute infarct.  No intracranial hemorrhage.  No intracranial mass lesion detected on this unenhanced exam.  Scattered nonspecific white matter type changes similar to the prior exam which may represent result of small vessel disease in this patient with diabetes and hyperlipidemia.  Result migraine headaches not excluded.  Other considerations for white matter type changes not entirely excluded although felt to be less likely include that secondary to; demyelinating process, inflammatory process, vasculitis or prior trauma.  Polypoid opacification in the left maxillary sinus.  Major intracranial vascular structures are patent.  IMPRESSION: No acute infarct.  Nonspecific white matter type changes as noted above.  Original Report Authenticated By: Fuller Canada, M.D.   Ct Abdomen Pelvis W Contrast  07/26/2012  *RADIOLOGY REPORT*  Clinical Data: Low abdominal pain.  CT ABDOMEN AND PELVIS WITH CONTRAST  Technique:  Multidetector CT imaging of the abdomen and pelvis was performed following the standard protocol during bolus administration of intravenous contrast.  Contrast: OMNIPAQUE IOHEXOL 300 MG/ML  SOLN  Comparison: 05/10/2010  Findings: Dependent atelectasis in the visualized lung bases. Stable sub centimeter probable hepatic cysts.  No new liver lesion. Unremarkable gallbladder, spleen, adrenal glands, kidneys, pancreas.  Patchy aortic plaque without aneurysm.  Portal vein patent.  Stomach and small bowel decompressed.  Appendix not discretely identified.  The colon is nondilated.  There are scattered descending and sigmoid diverticula.  There are marked inflammatory/edematous changes around the distal sigmoid colon with a short segment of circumferential wall thickening, which is new since prior  study.  No evidence of abscess.  There are a few regional prominent sub centimeter mesenteric lymph nodes.  Urinary bladder incompletely distended.  No ascites.  No free air. Degenerative disc disease L5-S1.  No hydronephrosis.  IMPRESSION:  1. Probable distal sigmoid diverticulitis without abscess. Given the regional   prominent mesenteric lymph nodes, follow-up recommended to exclude mucosal lesion.  Original Report Authenticated By: Thora Lance III, M.D.    Scheduled Meds:    . sodium chloride   Intravenous STAT  . atorvastatin  20 mg Oral q1800  . ciprofloxacin  500 mg Oral BID  . clonazePAM  0.5 mg Oral TID  . famotidine  20 mg Oral Daily  . lamoTRIgine  50 mg Oral BID  . lidocaine  1 patch Transdermal Q24H  . metroNIDAZOLE  500 mg Oral Q8H  . pantoprazole  40 mg Oral Q1200   Continuous Infusions:    . sodium chloride 20 mL/hr at 07/28/12 1000    Jerry Shaffer [id number]  If 7PM-7AM, please contact night-coverage [email or web address] Password [facility] 07/28/2012, 12:18 PM

## 2012-07-28 NOTE — Progress Notes (Signed)
Subjective: Reports less abdominal pain this morning, repots +flatus,BM. Would like to eat. Objective: Vital signs in last 24 hours: Temp:  [98.4 F (36.9 C)-98.8 F (37.1 C)] 98.6 F (37 C) (08/14 0514) Pulse Rate:  [86-94] 86  (08/14 0514) Resp:  [18-20] 20  (08/14 0514) BP: (101-141)/(63-82) 101/63 mmHg (08/14 0514) SpO2:  [95 %-96 %] 95 % (08/14 0514) Weight:  [251 lb (113.853 kg)] 251 lb (113.853 kg) (08/13 1249) Last BM Date: 07/27/12  Intake/Output from previous day: 08/13 0701 - 08/14 0700 In: 3559.6 [P.O.:620; I.V.:2939.6] Out: -  Intake/Output this shift:    General appearance: alert, cooperative and no distress Chest: cta bilaterally Abdomen: soft, non tender, + BS Flatus, no bloating or c/o N/V. VSS, afebrile, stool was C-Diff neg No labs today, WBC was trending downward yesterday. On oral ABX.   Lab Results:   Basename 07/27/12 0549 07/26/12 1439  WBC 15.3* 21.2*  HGB 11.8* 14.3  HCT 36.8* 44.4  PLT 227 281   BMET  Basename 07/27/12 0549 07/26/12 1439  NA 138 139  K 3.5 3.7  CL 104 102  CO2 25 24  GLUCOSE 94 129*  BUN 13 13  CREATININE 0.82 0.98  CALCIUM 8.3* 9.7   PT/INR No results found for this basename: LABPROT:2,INR:2 in the last 72 hours ABG No results found for this basename: PHART:2,PCO2:2,PO2:2,HCO3:2 in the last 72 hours  Studies/Results: Ct Head Wo Contrast  07/26/2012  *RADIOLOGY REPORT*  Clinical Data: Left facial numbness.  CT HEAD WITHOUT CONTRAST  Technique:  Contiguous axial images were obtained from the base of the skull through the vertex without contrast.  Comparison: 04/19/2012  Findings: There is no evidence of acute intracranial hemorrhage, brain edema, mass lesion, acute infarction,   mass effect, or midline shift. Acute infarct may be inapparent on noncontrast CT. No other intra-axial abnormalities are seen, and the ventricles and sulci are within normal limits in size and symmetry.   No abnormal extra-axial fluid  collections or masses are identified.  No significant calvarial abnormality.  IMPRESSION: 1. Negative for bleed or other acute intracranial process.  Original Report Authenticated By: Osa Craver, M.D.   Mri Brain Without Contrast  07/27/2012  *RADIOLOGY REPORT*  Clinical Data: Headache with left-sided facial numbness. Diabetic patient with dyslipidemia.  History of migraine headaches.  MRI HEAD WITHOUT CONTRAST  Technique:  Multiplanar, multiecho pulse sequences of the brain and surrounding structures were obtained according to standard protocol without intravenous contrast.  Comparison: 07/26/2012 CT.  04/19/2012 MR.  Findings: No acute infarct.  No intracranial hemorrhage.  No intracranial mass lesion detected on this unenhanced exam.  Scattered nonspecific white matter type changes similar to the prior exam which may represent result of small vessel disease in this patient with diabetes and hyperlipidemia.  Result migraine headaches not excluded.  Other considerations for white matter type changes not entirely excluded although felt to be less likely include that secondary to; demyelinating process, inflammatory process, vasculitis or prior trauma.  Polypoid opacification in the left maxillary sinus.  Major intracranial vascular structures are patent.  IMPRESSION: No acute infarct.  Nonspecific white matter type changes as noted above.  Original Report Authenticated By: Fuller Canada, M.D.   Ct Abdomen Pelvis W Contrast  07/26/2012  *RADIOLOGY REPORT*  Clinical Data: Low abdominal pain.  CT ABDOMEN AND PELVIS WITH CONTRAST  Technique:  Multidetector CT imaging of the abdomen and pelvis was performed following the standard protocol during bolus administration of intravenous contrast.  Contrast: OMNIPAQUE IOHEXOL 300 MG/ML  SOLN  Comparison: 05/10/2010  Findings: Dependent atelectasis in the visualized lung bases. Stable sub centimeter probable hepatic cysts.  No new liver lesion.  Unremarkable gallbladder, spleen, adrenal glands, kidneys, pancreas.  Patchy aortic plaque without aneurysm.  Portal vein patent.  Stomach and small bowel decompressed.  Appendix not discretely identified.  The colon is nondilated.  There are scattered descending and sigmoid diverticula.  There are marked inflammatory/edematous changes around the distal sigmoid colon with a short segment of circumferential wall thickening, which is new since prior study.  No evidence of abscess.  There are a few regional prominent sub centimeter mesenteric lymph nodes.  Urinary bladder incompletely distended.  No ascites.  No free air. Degenerative disc disease L5-S1.  No hydronephrosis.  IMPRESSION:  1. Probable distal sigmoid diverticulitis without abscess. Given the regional   prominent mesenteric lymph nodes, follow-up recommended to exclude mucosal lesion.  Original Report Authenticated By: Osa Craver, M.D.    Anti-infectives: Anti-infectives     Start     Dose/Rate Route Frequency Ordered Stop   07/27/12 1400   metroNIDAZOLE (FLAGYL) tablet 500 mg        500 mg Oral 3 times per day 07/27/12 0948     07/27/12 1100   ciprofloxacin (CIPRO) tablet 500 mg        500 mg Oral 2 times daily 07/27/12 0948     07/27/12 0600   metroNIDAZOLE (FLAGYL) IVPB 500 mg  Status:  Discontinued        500 mg 100 mL/hr over 60 Minutes Intravenous Every 8 hours 07/27/12 0001 07/27/12 0948   07/27/12 0100   ciprofloxacin (CIPRO) IVPB 400 mg  Status:  Discontinued        400 mg 200 mL/hr over 60 Minutes Intravenous 2 times daily 07/27/12 0024 07/27/12 0948   07/27/12 0015   metroNIDAZOLE (FLAGYL) IVPB 500 mg  Status:  Discontinued        500 mg 100 mL/hr over 60 Minutes Intravenous Every 8 hours 07/27/12 0001 07/27/12 0009   07/26/12 2200   ciprofloxacin (CIPRO) IVPB 400 mg  Status:  Discontinued        400 mg 200 mL/hr over 60 Minutes Intravenous  Once 07/26/12 2148 07/27/12 0024   07/26/12 2200   metroNIDAZOLE  (FLAGYL) IVPB 500 mg        500 mg 100 mL/hr over 60 Minutes Intravenous  Once 07/26/12 2148 07/26/12 2326          Assessment/Plan: s/p * No surgery found * Medicine note seen and appreciated, agree with plan. Clear from surgical standpoint for discharge, when appropriate per medicine team.    LOS: 2 days    Giorgio Chabot 07/28/2012

## 2012-07-28 NOTE — Progress Notes (Signed)
I would recommend bowel rest and iv abx until pain much better then start on clears and advance slowly to low residue diet. I don't think discharge today is a good idea for him

## 2012-07-29 LAB — GLUCOSE, CAPILLARY: Glucose-Capillary: 170 mg/dL — ABNORMAL HIGH (ref 70–99)

## 2012-07-29 MED ORDER — OXYCODONE HCL 5 MG PO TABS: 5.0000 mg | ORAL_TABLET | ORAL | Status: DC | PRN

## 2012-07-29 MED ORDER — METRONIDAZOLE 500 MG PO TABS: 500.0000 mg | ORAL_TABLET | Freq: Three times a day (TID) | ORAL | Status: AC

## 2012-07-29 MED ORDER — CIPROFLOXACIN HCL 500 MG PO TABS: 500.0000 mg | ORAL_TABLET | Freq: Two times a day (BID) | ORAL | Status: AC

## 2012-07-29 MED ORDER — OXYCODONE HCL 5 MG PO TABS: 5.0000 mg | ORAL_TABLET | ORAL | Status: AC | PRN

## 2012-07-29 NOTE — Discharge Summary (Signed)
Physician Discharge Summary  Jerry Shaffer ZOX:[id number] DOB: [birth year] DOA: 07/26/2012  PCP: Dala Dock  Admit date: 07/26/2012 Discharge date: 07/29/2012  Discharge Diagnoses:  Principal Problem:  *Diverticulitis Active Problems:  Left facial numbness  Headache  DM2 (diabetes mellitus, type 2)   Discharge Condition: Good, tolerating a diet, afebrile  Diet recommendation: Low-fat, bland, liquid  Filed Weights   07/27/12 1249  Weight: 113.853 kg (251 lb)    History of present illness:  51 year old man admitted with severe abdominal pain and he was found to have acute diverticulitis  Hospital Course:  Diverticulitis (07/26/2012) -patient admitted with acute diverticulitis on 8/12 without abscess. Abdominal pain much improved by 8/13 and he was started on a regular diet. Initially treated with iv antibiotics changed to po abx on 8/13. On August 14 the abdominal pain was still present. The diet was scaled back to clear liquids . Pain and leukocytosis continued to improve and Jerry Shaffer was discharged home on 10 days of oral antibiotics with ciprofloxacin and Flagyl. Patient was instructed to report immediately back to the emergency room if he has increased abdominal pain or fevers  Left facial numbness (07/26/2012) -this symptom was reported on admission. It resolved immediately after admission . no asymmetry.unlikely a stroke. -Unlikely facial palsy. MRI of the brain was unremarkable DM2 (diabetes mellitus, type 2) (07/26/2012) The patient was Continued  on sliding scale insulin while inpatient . At the time of the discharge glipizide and metformin were resumed   Bipolar disorder-patient was continued on his chronic medications  Procedures:  CT scan abdomen and pelvis  MRI brain  Consultations:  Surgery  Discharge Exam: Filed Vitals:   07/29/12 0539  BP: 150/96  Pulse: 86  Temp: 97.5 F (36.4 C)  Resp: 20   Filed Vitals:   07/28/12 1339 07/28/12 1823 07/28/12  2124 07/29/12 0539  BP: 133/77 158/86 143/79 150/96  Pulse: 88 87 81 86  Temp: 98 F (36.7 C) 98.5 F (36.9 C) 98.9 F (37.2 C) 97.5 F (36.4 C)  TempSrc: Oral Oral Oral Oral  Resp: 20 20 20 20   Height:      Weight:      SpO2: 98% 96% 96% 97%    General: Alert oriented times Cardiovascular: Regular rate  Respiratory: Clear to auscultation bilaterally Abdomen is obese soft with minimal tenderness in the left lower caudal and. No rebound no guarding bowel sounds are present  Discharge Instructions  Discharge Orders    Future Orders Please Complete By Expires   Diet general      Scheduling Instructions:   Soft diet , low fat   Increase activity slowly        Medication List  As of 07/29/2012  1:34 PM   TAKE these medications         aspirin 81 MG tablet   Take 4 tablets (324 mg total) by mouth daily.      atorvastatin 20 MG tablet   Commonly known as: LIPITOR   Take 20 mg by mouth daily.      b complex-C-folic acid 1 MG capsule   Take 1 capsule by mouth daily.      ciprofloxacin 500 MG tablet   Commonly known as: CIPRO   Take 1 tablet (500 mg total) by mouth 2 (two) times daily.      clonazePAM 0.5 MG tablet   Commonly known as: KLONOPIN   Take 0.5 mg by mouth 3 (three) times daily. 2 tabs in morning and 1 at  night      famotidine 20 MG tablet   Commonly known as: PEPCID   Take 20 mg by mouth daily.      glipiZIDE 10 MG tablet   Commonly known as: GLUCOTROL   Take 10 mg by mouth once.      lamoTRIgine 25 MG tablet   Commonly known as: LAMICTAL   Take 50 mg by mouth 2 (two) times daily.      metFORMIN 500 MG tablet   Commonly known as: GLUCOPHAGE   Take 500 mg by mouth 2 (two) times daily with a meal.      methylphenidate 20 MG ER tablet   Commonly known as: METADATE ER   Take 20 mg by mouth 3 (three) times daily. 2 Tabs in Morning and 1 at mid-day      metroNIDAZOLE 500 MG tablet   Commonly known as: FLAGYL   Take 1 tablet (500 mg total) by mouth  every 8 (eight) hours.      multivitamin with minerals tablet   Take 1 tablet by mouth daily.      oxyCODONE 5 MG immediate release tablet   Commonly known as: Oxy IR/ROXICODONE   Take 1 tablet (5 mg total) by mouth every 4 (four) hours as needed for pain.      RABEprazole 20 MG tablet   Commonly known as: ACIPHEX   Take 20 mg by mouth daily.              The results of significant diagnostics from this hospitalization (including imaging, microbiology, ancillary and laboratory) are listed below for reference.    Significant Diagnostic Studies: Ct Head Wo Contrast  07/26/2012  *RADIOLOGY REPORT*  Clinical Data: Left facial numbness.  CT HEAD WITHOUT CONTRAST  Technique:  Contiguous axial images were obtained from the base of the skull through the vertex without contrast.  Comparison: 04/19/2012  Findings: There is no evidence of acute intracranial hemorrhage, brain edema, mass lesion, acute infarction,   mass effect, or midline shift. Acute infarct may be inapparent on noncontrast CT. No other intra-axial abnormalities are seen, and the ventricles and sulci are within normal limits in size and symmetry.   No abnormal extra-axial fluid collections or masses are identified.  No significant calvarial abnormality.  IMPRESSION: 1. Negative for bleed or other acute intracranial process.  Original Report Authenticated By: Osa Craver, M.D.   Mri Brain Without Contrast  07/27/2012  *RADIOLOGY REPORT*  Clinical Data: Headache with left-sided facial numbness. Diabetic patient with dyslipidemia.  History of migraine headaches.  MRI HEAD WITHOUT CONTRAST  Technique:  Multiplanar, multiecho pulse sequences of the brain and surrounding structures were obtained according to standard protocol without intravenous contrast.  Comparison: 07/26/2012 CT.  04/19/2012 MR.  Findings: No acute infarct.  No intracranial hemorrhage.  No intracranial mass lesion detected on this unenhanced exam.  Scattered  nonspecific white matter type changes similar to the prior exam which may represent result of small vessel disease in this patient with diabetes and hyperlipidemia.  Result migraine headaches not excluded.  Other considerations for white matter type changes not entirely excluded although felt to be less likely include that secondary to; demyelinating process, inflammatory process, vasculitis or prior trauma.  Polypoid opacification in the left maxillary sinus.  Major intracranial vascular structures are patent.  IMPRESSION: No acute infarct.  Nonspecific white matter type changes as noted above.  Original Report Authenticated By: Fuller Canada, M.D.   Ct Abdomen Pelvis W Contrast  07/26/2012  *RADIOLOGY REPORT*  Clinical Data: Low abdominal pain.  CT ABDOMEN AND PELVIS WITH CONTRAST  Technique:  Multidetector CT imaging of the abdomen and pelvis was performed following the standard protocol during bolus administration of intravenous contrast.  Contrast: OMNIPAQUE IOHEXOL 300 MG/ML  SOLN  Comparison: 05/10/2010  Findings: Dependent atelectasis in the visualized lung bases. Stable sub centimeter probable hepatic cysts.  No new liver lesion. Unremarkable gallbladder, spleen, adrenal glands, kidneys, pancreas.  Patchy aortic plaque without aneurysm.  Portal vein patent.  Stomach and small bowel decompressed.  Appendix not discretely identified.  The colon is nondilated.  There are scattered descending and sigmoid diverticula.  There are marked inflammatory/edematous changes around the distal sigmoid colon with a short segment of circumferential wall thickening, which is new since prior study.  No evidence of abscess.  There are a few regional prominent sub centimeter mesenteric lymph nodes.  Urinary bladder incompletely distended.  No ascites.  No free air. Degenerative disc disease L5-S1.  No hydronephrosis.  IMPRESSION:  1. Probable distal sigmoid diverticulitis without abscess. Given the regional    prominent mesenteric lymph nodes, follow-up recommended to exclude mucosal lesion.  Original Report Authenticated By: Osa Craver, M.D.    Microbiology: Recent Results (from the past 240 hour(s))  CLOSTRIDIUM DIFFICILE BY PCR     Status: Normal   Collection Time   07/27/12 11:19 AM      Component Value Range Status Comment   C difficile by pcr NEGATIVE  NEGATIVE Final      Labs: Basic Metabolic Panel:  Lab 07/27/12 4098 07/26/12 1439  NA 138 139  K 3.5 3.7  CL 104 102  CO2 25 24  GLUCOSE 94 129*  BUN 13 13  CREATININE 0.82 0.98  CALCIUM 8.3* 9.7  MG -- --  PHOS -- --   Liver Function Tests:  Lab 07/27/12 0549  AST 8  ALT 10  ALKPHOS 72  BILITOT 0.5  PROT 6.0  ALBUMIN 3.2*   No results found for this basename: LIPASE:5,AMYLASE:5 in the last 168 hours No results found for this basename: AMMONIA:5 in the last 168 hours CBC:  Lab 07/28/12 0847 07/27/12 0549 07/26/12 1439  WBC 11.7* 15.3* 21.2*  NEUTROABS -- 12.4* 17.7*  HGB 11.7* 11.8* 14.3  HCT 36.4* 36.8* 44.4  MCV 87.7 88.2 87.6  PLT 254 227 281   Cardiac Enzymes: No results found for this basename: CKTOTAL:5,CKMB:5,CKMBINDEX:5,TROPONINI:5 in the last 168 hours BNP: BNP (last 3 results) No results found for this basename: PROBNP:3 in the last 8760 hours CBG:  Lab 07/29/12 1147 07/29/12 0850 07/28/12 1145 07/28/12 0752 07/27/12 2120  GLUCAP 118* 170* 142* 122* 189*    Time coordinating discharge: 40 minutes  Signed:  Tiwan Schnitker  [facility] 07/29/2012, 1:34 PM

## 2012-07-29 NOTE — Progress Notes (Signed)
Patient ID: Jerry Shaffer, male   DOB: Feb 08, 1961, 51 y.o.   MRN: [id number]    Subjective: Reports less abdominal pain this morning, reports +flatus,BM. Would like to eat. States that his abdominal pain is no different on liquid diet vs regular diet. Objective: Vital signs in last 24 hours: Temp:  [97.5 F (36.4 C)-98.9 F (37.2 C)] 97.5 F (36.4 C) (08/15 0539) Pulse Rate:  [81-88] 86  (08/15 0539) Resp:  [20] 20  (08/15 0539) BP: (120-158)/(67-96) 150/96 mmHg (08/15 0539) SpO2:  [96 %-98 %] 97 % (08/15 0539) Last BM Date: 07/27/12  Intake/Output from previous day: 08/14 0701 - 08/15 0700 In: 720 [P.O.:720] Out: -  Intake/Output this shift:    General appearance: alert, cooperative and no distress Chest: cta bilaterally Abdomen: soft, minimally tender, + BS, BM,  Flatus, no bloating or c/o N/V. VSS, afebrile, stool was C-Diff neg No labs today, WBC was 11.7 yesterday. On oral ABX.   Lab Results:   [facility] 07/28/12 0847 07/27/12 0549  WBC 11.7* 15.3*  HGB 11.7* 11.8*  HCT 36.4* 36.8*  PLT 254 227   BMET  Basename 07/27/12 0549 07/26/12 1439  NA 138 139  K 3.5 3.7  CL 104 102  CO2 25 24  GLUCOSE 94 129*  BUN 13 13  CREATININE 0.82 0.98  CALCIUM 8.3* 9.7   PT/INR No results found for this basename: LABPROT:2,INR:2 in the last 72 hours ABG No results found for this basename: PHART:2,PCO2:2,PO2:2,HCO3:2 in the last 72 hours  Studies/Results: No results found.  Anti-infectives: Anti-infectives     Start     Dose/Rate Route Frequency Ordered Stop   07/27/12 1400   metroNIDAZOLE (FLAGYL) tablet 500 mg        500 mg Oral 3 times per day 07/27/12 0948     07/27/12 1100   ciprofloxacin (CIPRO) tablet 500 mg        500 mg Oral 2 times daily 07/27/12 0948     07/27/12 0600   metroNIDAZOLE (FLAGYL) IVPB 500 mg  Status:  Discontinued        500 mg 100 mL/hr over 60 Minutes Intravenous Every 8 hours 07/27/12 0001 07/27/12 0948   07/27/12 0100    ciprofloxacin (CIPRO) IVPB 400 mg  Status:  Discontinued        400 mg 200 mL/hr over 60 Minutes Intravenous 2 times daily 07/27/12 0024 07/27/12 0948   07/27/12 0015   metroNIDAZOLE (FLAGYL) IVPB 500 mg  Status:  Discontinued        500 mg 100 mL/hr over 60 Minutes Intravenous Every 8 hours 07/27/12 0001 07/27/12 0009   07/26/12 2200   ciprofloxacin (CIPRO) IVPB 400 mg  Status:  Discontinued        400 mg 200 mL/hr over 60 Minutes Intravenous  Once 07/26/12 2148 07/27/12 0024   07/26/12 2200   metroNIDAZOLE (FLAGYL) IVPB 500 mg        500 mg 100 mL/hr over 60 Minutes Intravenous  Once 07/26/12 2148 07/26/12 2326          Assessment/Plan: s/p * No surgery found *  1.Recommend continued bowel rest and change back to IV abx until pain much better. Clear liquid diet, advance slowly to low residue diet.(patient remains symptomatic, but do not think this represents abscess formation as he remains afebrile with normal WBC's) Will recheck CBC in am.  2. I don't think discharge today is a good idea for him. (But patient seems anxious to go  home today) Plan was discussed with him and he is agreeable.     LOS: 3 days    Zamir Staples 07/29/2012

## 2012-07-29 NOTE — Progress Notes (Signed)
Jerry Shaffer discharged Home per MD order.  Discharge instructions reviewed and discussed with the patient, all questions and concerns answered. Copy of instructions and scripts given to patient.   [name] [name]  Home Medication Instructions [id number]   Printed on:07/29/12 1415  Medication Information                    lamoTRIgine (LAMICTAL) 25 MG tablet Take 50 mg by mouth 2 (two) times daily.            clonazePAM (KLONOPIN) 0.5 MG tablet Take 0.5 mg by mouth 3 (three) times daily. 2 tabs in morning and 1 at night           methylphenidate (METADATE ER) 20 MG ER tablet Take 20 mg by mouth 3 (three) times daily. 2 Tabs in Morning and 1 at mid-day           metFORMIN (GLUCOPHAGE) 500 MG tablet Take 500 mg by mouth 2 (two) times daily with a meal.             glipiZIDE (GLUCOTROL) 10 MG tablet Take 10 mg by mouth once.            RABEprazole (ACIPHEX) 20 MG tablet Take 20 mg by mouth daily.            famotidine (PEPCID) 20 MG tablet Take 20 mg by mouth daily.            atorvastatin (LIPITOR) 20 MG tablet Take 20 mg by mouth daily.            Multiple Vitamins-Minerals (MULTIVITAMIN WITH MINERALS) tablet Take 1 tablet by mouth daily.           b complex-C-folic acid 1 MG capsule Take 1 capsule by mouth daily.           aspirin 81 MG tablet Take 4 tablets (324 mg total) by mouth daily.           ciprofloxacin (CIPRO) 500 MG tablet Take 1 tablet (500 mg total) by mouth 2 (two) times daily.           metroNIDAZOLE (FLAGYL) 500 MG tablet Take 1 tablet (500 mg total) by mouth every 8 (eight) hours.           oxyCODONE (OXY IR/ROXICODONE) 5 MG immediate release tablet Take 1 tablet (5 mg total) by mouth every 4 (four) hours as needed for pain.             Patients skin is clean, dry and intact, no evidence of skin break down. IV site discontinued and catheter remains intact. Site without signs and symptoms of complications. Dressing and pressure  applied.  Patient escorted to car by NT in a wheelchair,  no distress noted upon discharge.  [name] 07/29/2012 2:15 PM

## 2012-12-15 ENCOUNTER — Encounter (HOSPITAL_COMMUNITY): Payer: Self-pay | Admitting: *Deleted

## 2012-12-15 ENCOUNTER — Emergency Department (HOSPITAL_COMMUNITY): Payer: Self-pay

## 2012-12-15 ENCOUNTER — Emergency Department (HOSPITAL_COMMUNITY)
Admission: EM | Admit: 2012-12-15 | Discharge: 2012-12-15 | Disposition: A | Discharge status: Home / Self Care | Payer: Self-pay | Attending: Emergency Medicine | Admitting: Emergency Medicine

## 2012-12-15 DIAGNOSIS — Z7982 Long term (current) use of aspirin: Secondary | ICD-10-CM | POA: Insufficient documentation

## 2012-12-15 DIAGNOSIS — F17200 Nicotine dependence, unspecified, uncomplicated: Secondary | ICD-10-CM | POA: Insufficient documentation

## 2012-12-15 DIAGNOSIS — E785 Hyperlipidemia, unspecified: Secondary | ICD-10-CM | POA: Insufficient documentation

## 2012-12-15 DIAGNOSIS — R05 Cough: Secondary | ICD-10-CM | POA: Insufficient documentation

## 2012-12-15 DIAGNOSIS — R059 Cough, unspecified: Secondary | ICD-10-CM | POA: Insufficient documentation

## 2012-12-15 DIAGNOSIS — Z8679 Personal history of other diseases of the circulatory system: Secondary | ICD-10-CM | POA: Insufficient documentation

## 2012-12-15 DIAGNOSIS — E119 Type 2 diabetes mellitus without complications: Secondary | ICD-10-CM | POA: Insufficient documentation

## 2012-12-15 DIAGNOSIS — Z8673 Personal history of transient ischemic attack (TIA), and cerebral infarction without residual deficits: Secondary | ICD-10-CM | POA: Insufficient documentation

## 2012-12-15 DIAGNOSIS — Z8709 Personal history of other diseases of the respiratory system: Secondary | ICD-10-CM | POA: Insufficient documentation

## 2012-12-15 DIAGNOSIS — F319 Bipolar disorder, unspecified: Secondary | ICD-10-CM | POA: Insufficient documentation

## 2012-12-15 DIAGNOSIS — J40 Bronchitis, not specified as acute or chronic: Secondary | ICD-10-CM | POA: Insufficient documentation

## 2012-12-15 MED ORDER — ALBUTEROL SULFATE (5 MG/ML) 0.5% IN NEBU
5.0000 mg | INHALATION_SOLUTION | Freq: Once | RESPIRATORY_TRACT | Status: AC
  Administered 2012-12-15: 5 mg via RESPIRATORY_TRACT
  Filled 2012-12-15: qty 1

## 2012-12-15 MED ORDER — ATORVASTATIN CALCIUM 10 MG PO TABS: 20.0000 mg | ORAL_TABLET | Freq: Every day | ORAL | Status: DC

## 2012-12-15 MED ORDER — GLIPIZIDE 10 MG PO TABS: 10.0000 mg | ORAL_TABLET | Freq: Every day | ORAL | Status: DC

## 2012-12-15 MED ORDER — ALBUTEROL SULFATE HFA 108 (90 BASE) MCG/ACT IN AERS
2.0000 | INHALATION_SPRAY | RESPIRATORY_TRACT | Status: DC
  Administered 2012-12-15: 2 via RESPIRATORY_TRACT
  Filled 2012-12-15: qty 6.7

## 2012-12-15 NOTE — ED Provider Notes (Signed)
Medical screening examination/treatment/procedure(s) were performed by non-physician practitioner and as supervising physician I was immediately available for consultation/collaboration.  Toy Baker, MD 12/15/12 [phone]

## 2012-12-15 NOTE — ED Provider Notes (Signed)
History     CSN: [id number]  Arrival date & time 12/15/12  7829   First MD Initiated Contact with Patient 12/15/12 [phone]      Chief Complaint  Patient presents with  . Fever  . Cough    (Consider location/radiation/quality/duration/timing/severity/associated sxs/prior treatment) HPI Comments: This is a 52 year old male, who presents emergency department with chief complaint of cough, and fever. Patient states that he's been sick for the past 6 days. His symptoms have been persistent. He is tried taking Mucinex with no relief. There are no aggravating or alleviating factors. States the symptoms are moderate in severity. He is also requesting a refill of his Lipitor and glipizide. He endorses chest pain, and shortness of breath, which are both worsened with coughing. He denies headache, nausea, vomiting, diarrhea, constipation, numbness or tingling of the extremities.  The history is provided by the patient. No language interpreter was used.    Past Medical History  Diagnosis Date  . Transient ischemic attack (TIA)   . Diabetes mellitus   . Bipolar 1 disorder   . Migraine   . Dyslipidemia   . Emphysema   . PONV (postoperative nausea and vomiting)   . Shortness of breath   . Mental disorder     BIPOLAR DISORDER    Past Surgical History  Procedure Date  . Lumbar disc surgery   . Hemorroidectomy   . Tonsillectomy and adenoidectomy   . Knee arthroscopy     Family History  Problem Relation Age of Onset  . Diabetes type II    . Coronary artery disease    . Bipolar disorder    . Lung cancer Father     History  Substance Use Topics  . Smoking status: Current Some Day Smoker -- 30 years    Types: Cigarettes  . Smokeless tobacco: Current User    Types: Snuff  . Alcohol Use: No      Review of Systems  All other systems reviewed and are negative.    Allergies  Bee venom; Penicillins; and Procaine  Home Medications   Current Outpatient Rx  Name  Route  Sig   Dispense  Refill  . ASPIRIN 81 MG PO TABS   Oral   Take 81 mg by mouth 2 (two) times daily.         . ATORVASTATIN CALCIUM 20 MG PO TABS   Oral   Take 20 mg by mouth every evening.          Marland Kitchen NEPHROCAPS 1 MG PO CAPS   Oral   Take 1 capsule by mouth daily.         Marland Kitchen CLONAZEPAM 0.5 MG PO TABS   Oral   Take 0.5 mg by mouth 3 (three) times daily. Take 2 tablets in morning and 1 tablet at night         . DM-GUAIFENESIN ER 30-600 MG PO TB12   Oral   Take 1 tablet by mouth 2 (two) times daily as needed. For cough         . FAMOTIDINE 20 MG PO TABS   Oral   Take 20 mg by mouth daily.          Marland Kitchen GLIPIZIDE 10 MG PO TABS   Oral   Take 10 mg by mouth every morning.          Marland Kitchen LAMOTRIGINE 25 MG PO TABS   Oral   Take 50 mg by mouth 2 (two) times daily.          Marland Kitchen  LIDOCAINE 5 % EX PTCH   Transdermal   Place 1 patch onto the skin daily. Remove & Discard patch within 12 hours or as directed by MD         . METFORMIN HCL 500 MG PO TABS   Oral   Take 500 mg by mouth 2 (two) times daily with a meal.           . METHYLPHENIDATE HCL 20 MG PO TBCR   Oral   Take 20 mg by mouth 3 (three) times daily. Take 2 tablets in the morning and 1 tablet at mid-day         . MULTI-VITAMIN/MINERALS PO TABS   Oral   Take 1 tablet by mouth every morning.          Marland Kitchen RABEPRAZOLE SODIUM 20 MG PO TBEC   Oral   Take 20 mg by mouth every morning.            BP 150/77  Pulse 89  Temp 97.8 F (36.6 C) (Oral)  Resp 19  SpO2 97%  Physical Exam  Nursing note and vitals reviewed. Constitutional: He is oriented to person, place, and time. He appears well-developed and well-nourished.  HENT:  Head: Normocephalic and atraumatic.  Nose: Nose normal.  Mouth/Throat: Oropharynx is clear and moist. No oropharyngeal exudate.  Eyes: Conjunctivae normal and EOM are normal. Pupils are equal, round, and reactive to light. Right eye exhibits no discharge. Left eye exhibits no discharge.  No scleral icterus.  Neck: Normal range of motion. Neck supple. No JVD present.  Cardiovascular: Normal rate, regular rhythm, normal heart sounds and intact distal pulses.  Exam reveals no gallop and no friction rub.   No murmur heard. Pulmonary/Chest: Effort normal. No respiratory distress. He has wheezes. He has no rales. He exhibits no tenderness.       Mild wheezing right lower lobe   Abdominal: Soft. Bowel sounds are normal. He exhibits no distension and no mass. There is no tenderness. There is no rebound and no guarding.  Musculoskeletal: Normal range of motion. He exhibits no edema and no tenderness.  Neurological: He is alert and oriented to person, place, and time. He has normal reflexes.       CN 3-12 intact  Skin: Skin is warm and dry.  Psychiatric: He has a normal mood and affect. His behavior is normal. Judgment and thought content normal.    ED Course  Procedures (including critical care time)   Labs Reviewed  GLUCOSE, CAPILLARY   Results for orders placed during the hospital encounter of 12/15/12  GLUCOSE, CAPILLARY      Component Value Range   Glucose-Capillary 97  70 - 99 mg/dL   Dg Chest 2 View  4/0/9811  *RADIOLOGY REPORT*  Clinical Data: Cough, congestion, shortness of breath for 1 week. History of smoking.  No previous heart or lung disease.  CHEST - 2 VIEW  Comparison: 07/21/2011  Findings: Heart size is normal.  There is mild perihilar peribronchial thickening.  There are no focal consolidations or pleural effusions.  IMPRESSION:  1.  Mild bronchitic changes. 2. No focal pulmonary abnormality.   Original Report Authenticated By: Norva Pavlov, M.D.       1. Bronchitis       MDM  52 year old with Bronchitis.  Will discharge with MDI, and will recommend delsym or robitussin.  Refilled the patient's meds.  Patient is stable and ready for discharge.        Molly Maduro  Dahlia Client, PA-C 12/15/12 [phone]

## 2012-12-15 NOTE — ED Notes (Signed)
Pt cough, runny nose, sneezing, fever since Thursday.

## 2013-05-01 ENCOUNTER — Emergency Department (HOSPITAL_COMMUNITY): Payer: No Typology Code available for payment source

## 2013-05-01 ENCOUNTER — Encounter (HOSPITAL_COMMUNITY): Payer: Self-pay

## 2013-05-01 ENCOUNTER — Emergency Department (HOSPITAL_COMMUNITY)
Admission: EM | Admit: 2013-05-01 | Discharge: 2013-05-01 | Disposition: A | Discharge status: Home / Self Care | Payer: No Typology Code available for payment source | Attending: Emergency Medicine | Admitting: Emergency Medicine

## 2013-05-01 DIAGNOSIS — Z8673 Personal history of transient ischemic attack (TIA), and cerebral infarction without residual deficits: Secondary | ICD-10-CM | POA: Insufficient documentation

## 2013-05-01 DIAGNOSIS — F17200 Nicotine dependence, unspecified, uncomplicated: Secondary | ICD-10-CM | POA: Insufficient documentation

## 2013-05-01 DIAGNOSIS — F319 Bipolar disorder, unspecified: Secondary | ICD-10-CM | POA: Insufficient documentation

## 2013-05-01 DIAGNOSIS — Z8679 Personal history of other diseases of the circulatory system: Secondary | ICD-10-CM | POA: Insufficient documentation

## 2013-05-01 DIAGNOSIS — J438 Other emphysema: Secondary | ICD-10-CM | POA: Insufficient documentation

## 2013-05-01 DIAGNOSIS — E119 Type 2 diabetes mellitus without complications: Secondary | ICD-10-CM | POA: Insufficient documentation

## 2013-05-01 DIAGNOSIS — E785 Hyperlipidemia, unspecified: Secondary | ICD-10-CM | POA: Insufficient documentation

## 2013-05-01 DIAGNOSIS — M79642 Pain in left hand: Secondary | ICD-10-CM

## 2013-05-01 DIAGNOSIS — Z79899 Other long term (current) drug therapy: Secondary | ICD-10-CM | POA: Insufficient documentation

## 2013-05-01 DIAGNOSIS — M25549 Pain in joints of unspecified hand: Secondary | ICD-10-CM | POA: Insufficient documentation

## 2013-05-01 DIAGNOSIS — Z7982 Long term (current) use of aspirin: Secondary | ICD-10-CM | POA: Insufficient documentation

## 2013-05-01 DIAGNOSIS — Z880 Allergy status to penicillin: Secondary | ICD-10-CM | POA: Insufficient documentation

## 2013-05-01 HISTORY — DX: Cerebral infarction, unspecified: I63.9

## 2013-05-01 LAB — CBC WITH DIFFERENTIAL/PLATELET
Eosinophils Absolute: 0.3 10*3/uL (ref 0.0–0.7)
Lymphocytes Relative: 17 % (ref 12–46)
Lymphs Abs: 2.3 10*3/uL (ref 0.7–4.0)
MCH: 28.5 pg (ref 26.0–34.0)
Neutro Abs: 10.2 10*3/uL — ABNORMAL HIGH (ref 1.7–7.7)
Neutrophils Relative %: 75 % (ref 43–77)
Platelets: 304 10*3/uL (ref 150–400)
RBC: 4.7 MIL/uL (ref 4.22–5.81)
WBC: 13.7 10*3/uL — ABNORMAL HIGH (ref 4.0–10.5)

## 2013-05-01 LAB — BASIC METABOLIC PANEL
GFR calc non Af Amer: >90 mL/min (ref >90)
Glucose, Bld: 146 mg/dL — ABNORMAL HIGH (ref 70–99)
Potassium: 3.7 mEq/L (ref 3.5–5.1)
Sodium: 137 mEq/L (ref 135–145)

## 2013-05-01 LAB — GLUCOSE, CAPILLARY: Glucose-Capillary: 159 mg/dL — ABNORMAL HIGH (ref 70–99)

## 2013-05-01 MED ORDER — SULFAMETHOXAZOLE-TMP DS 800-160 MG PO TABS: 1.0000 | ORAL_TABLET | Freq: Two times a day (BID) | ORAL | Status: DC

## 2013-05-01 MED ORDER — MORPHINE SULFATE 4 MG/ML IJ SOLN
4.0000 mg | Freq: Once | INTRAMUSCULAR | Status: DC
  Filled 2013-05-01: qty 1

## 2013-05-01 MED ORDER — OXYCODONE-ACETAMINOPHEN 5-325 MG PO TABS
1.0000 | ORAL_TABLET | Freq: Once | ORAL | Status: AC
  Administered 2013-05-01: 1 via ORAL
  Filled 2013-05-01: qty 1

## 2013-05-01 MED ORDER — INDOMETHACIN 50 MG PO CAPS
50.0000 mg | ORAL_CAPSULE | Freq: Once | ORAL | Status: AC
  Administered 2013-05-01: 50 mg via ORAL
  Filled 2013-05-01: qty 1

## 2013-05-01 MED ORDER — CLINDAMYCIN HCL 150 MG PO CAPS: 300.0000 mg | ORAL_CAPSULE | Freq: Three times a day (TID) | ORAL | Status: DC

## 2013-05-01 MED ORDER — CLINDAMYCIN PHOSPHATE 900 MG/50ML IV SOLN
900.0000 mg | Freq: Once | INTRAVENOUS | Status: AC
  Administered 2013-05-01: 900 mg via INTRAVENOUS
  Filled 2013-05-01: qty 50

## 2013-05-01 MED ORDER — OXYCODONE-ACETAMINOPHEN 5-325 MG PO TABS: 1.0000 | ORAL_TABLET | Freq: Four times a day (QID) | ORAL | Status: DC | PRN

## 2013-05-01 MED ORDER — SULFAMETHOXAZOLE-TMP DS 800-160 MG PO TABS
1.0000 | ORAL_TABLET | Freq: Once | ORAL | Status: AC
  Administered 2013-05-01: 1 via ORAL
  Filled 2013-05-01: qty 1

## 2013-05-01 MED ORDER — SODIUM CHLORIDE 0.9 % IV SOLN
Freq: Once | INTRAVENOUS | Status: AC
  Administered 2013-05-01: 20 mL/h via INTRAVENOUS

## 2013-05-01 NOTE — ED Notes (Signed)
Ortho tech called for placement of thumb spica.  

## 2013-05-01 NOTE — ED Provider Notes (Signed)
Complains of left hand pain over dorsum of thumb and dorsum-radial aspect along the first metacarpal the past 2 days. Pain is increased with moving his thumb. On exam alert nontoxic patient is swollen red and tender at the proximal dorsal aspect of thumb and along first metacarpal area. Limited extension of thumb. Has pain on active or passive motion of thumb. Good capillary refill no red streaks up the arm radial pulse 2+ all other fingers and parts of hand no redness swelling or tenderness, neurovascularly intact  Doug Sou, MD 05/01/13 2109

## 2013-05-01 NOTE — ED Provider Notes (Signed)
History    This chart was scribed for non-physician practitioner Elpidio Anis PA-C working with Doug Sou, MD by Smitty Pluck, ED scribe. This patient was seen in room WTR8/WTR8 and the patient's care was started at 4:10 PM.   CSN: [id number]  Arrival date & time 05/01/13  1515      Chief Complaint  Patient presents with  . Hand Pain     The history is provided by the patient and medical records. No language interpreter was used.   HPI Comments: Jerry Shaffer is a 52 y.o. male with hx of DM who presents to the Emergency Department complaining of constant, moderate left hand swelling and redness onset 2 days ago. Pt reports that he has mild numbness in thumb and he has limited movement of left thumb due to pain. He denies injury to left hand. He states his CBG is controlled with medications that he takes daily. Pt denies radiation, fever, chills, nausea, vomiting, diarrhea, weakness, cough, SOB and any other pain.   Past Medical History  Diagnosis Date  . Transient ischemic attack (TIA)   . Diabetes mellitus   . Bipolar 1 disorder   . Migraine   . Dyslipidemia   . Emphysema   . PONV (postoperative nausea and vomiting)   . Shortness of breath   . Mental disorder     BIPOLAR DISORDER    Past Surgical History  Procedure Laterality Date  . Lumbar disc surgery    . Hemorroidectomy    . Tonsillectomy and adenoidectomy    . Knee arthroscopy      Family History  Problem Relation Age of Onset  . Diabetes type II    . Coronary artery disease    . Bipolar disorder    . Lung cancer Father     History  Substance Use Topics  . Smoking status: Current Some Day Smoker -- 30 years    Types: Cigarettes  . Smokeless tobacco: Current User    Types: Snuff  . Alcohol Use: No      Review of Systems  Constitutional: Negative for fever and chills.  Gastrointestinal: Negative for nausea and vomiting.  Neurological: Negative for weakness.    Allergies  Bee venom;  Penicillins; and Procaine  Home Medications   Current Outpatient Rx  Name  Route  Sig  Dispense  Refill  . aspirin 81 MG tablet   Oral   Take 81 mg by mouth 2 (two) times daily.         Marland Kitchen atorvastatin (LIPITOR) 10 MG tablet   Oral   Take 2 tablets (20 mg total) by mouth daily.   60 tablet   0   . atorvastatin (LIPITOR) 20 MG tablet   Oral   Take 20 mg by mouth every evening.          Marland Kitchen b complex-C-folic acid 1 MG capsule   Oral   Take 1 capsule by mouth daily.         . clonazePAM (KLONOPIN) 0.5 MG tablet   Oral   Take 0.5 mg by mouth 3 (three) times daily. Take 2 tablets in morning and 1 tablet at night         . dextromethorphan-guaiFENesin (MUCINEX DM) 30-600 MG per 12 hr tablet   Oral   Take 1 tablet by mouth 2 (two) times daily as needed. For cough         . famotidine (PEPCID) 20 MG tablet   Oral   Take  20 mg by mouth daily.          Marland Kitchen glipiZIDE (GLUCOTROL) 10 MG tablet   Oral   Take 10 mg by mouth every morning.          Marland Kitchen glipiZIDE (GLUCOTROL) 10 MG tablet   Oral   Take 1 tablet (10 mg total) by mouth daily with breakfast.   30 tablet   0   . lamoTRIgine (LAMICTAL) 25 MG tablet   Oral   Take 50 mg by mouth 2 (two) times daily.          Marland Kitchen lidocaine (LIDODERM) 5 %   Transdermal   Place 1 patch onto the skin daily. Remove & Discard patch within 12 hours or as directed by MD         . metFORMIN (GLUCOPHAGE) 500 MG tablet   Oral   Take 500 mg by mouth 2 (two) times daily with a meal.           . methylphenidate (METADATE ER) 20 MG ER tablet   Oral   Take 20 mg by mouth 3 (three) times daily. Take 2 tablets in the morning and 1 tablet at mid-day         . Multiple Vitamins-Minerals (MULTIVITAMIN WITH MINERALS) tablet   Oral   Take 1 tablet by mouth every morning.          . RABEprazole (ACIPHEX) 20 MG tablet   Oral   Take 20 mg by mouth every morning.            BP 130/81  Pulse 93  Temp(Src) 98.7 F (37.1 C)  (Oral)  Resp 18  SpO2 98%  Physical Exam  Nursing note and vitals reviewed. Constitutional: He is oriented to person, place, and time. He appears well-developed and well-nourished. No distress.  HENT:  Head: Normocephalic and atraumatic.  Eyes: EOM are normal.  Neck: Neck supple. No tracheal deviation present.  Cardiovascular: Normal rate.   Pulmonary/Chest: Effort normal. No respiratory distress.  Musculoskeletal:  Left hand with erythema and swelling over thenar and base of thumb  No streaking  Mild warmth  Distally there is decreased sensation vascularly intact   Neurological: He is alert and oriented to person, place, and time.  Skin: Skin is warm and dry.  Psychiatric: He has a normal mood and affect. His behavior is normal.    ED Course  Procedures (including critical care time) DIAGNOSTIC STUDIES: Oxygen Saturation is 98% on room air, normal by my interpretation.    COORDINATION OF CARE: 4:12 PM Discussed ED treatment with pt and pt agrees.  4:12 PM Ordered:  Medications  oxyCODONE-acetaminophen (PERCOCET/ROXICET) 5-325 MG per tablet 1 tablet (1 tablet Oral Given 05/01/13 1643)    Results for orders placed during the hospital encounter of 05/01/13  GLUCOSE, CAPILLARY      Result Value Range   Glucose-Capillary 159 (*) 70 - 99 mg/dL  CBC WITH DIFFERENTIAL      Result Value Range   WBC 13.7 (*) 4.0 - 10.5 K/uL   RBC 4.70  4.22 - 5.81 MIL/uL   Hemoglobin 13.4  13.0 - 17.0 g/dL   HCT 16.1  09.6 - 04.5 %   MCV 87.0  78.0 - 100.0 fL   MCH 28.5  26.0 - 34.0 pg   MCHC 32.8  30.0 - 36.0 g/dL   RDW 40.9  81.1 - 91.4 %   Platelets 304  150 - 400 K/uL   Neutrophils Relative % 75  43 - 77 %   Neutro Abs 10.2 (*) 1.7 - 7.7 K/uL   Lymphocytes Relative 17  12 - 46 %   Lymphs Abs 2.3  0.7 - 4.0 K/uL   Monocytes Relative 6  3 - 12 %   Monocytes Absolute 0.8  0.1 - 1.0 K/uL   Eosinophils Relative 2  0 - 5 %   Eosinophils Absolute 0.3  0.0 - 0.7 K/uL   Basophils Relative  0  0 - 1 %   Basophils Absolute 0.1  0.0 - 0.1 K/uL  BASIC METABOLIC PANEL      Result Value Range   Sodium 137  135 - 145 mEq/L   Potassium 3.7  3.5 - 5.1 mEq/L   Chloride 100  96 - 112 mEq/L   CO2 23  19 - 32 mEq/L   Glucose, Bld 146 (*) 70 - 99 mg/dL   BUN 14  6 - 23 mg/dL   Creatinine, Ser 2.13  0.50 - 1.35 mg/dL   Calcium 08.6  8.4 - 57.8 mg/dL   GFR calc non Af Amer >90  >90 mL/min   GFR calc Af Amer >90  >90 mL/min      Labs Reviewed - No data to display Dg Hand Complete Left  05/01/2013   *RADIOLOGY REPORT*  Clinical Data: Left hand pain and swelling, near the snuff box. No specific injury.  LEFT HAND - COMPLETE 3+ VIEW  Comparison: None.  Findings: The joints of the hand are aligned.  There is a 6 mm bony density adjacent to the first carpometacarpal joint.  This appears corticated, no discrete donor site is visualized.  The bones of the metacarpals and fingers are intact.  No joint space abnormality is identified.  There is some soft tissue stranding adjacent to the first metacarpal.  IMPRESSION: 6 mm bony density adjacent to the first carpometacarpal joint.  No acute fracture cannot be completely excluded.  This could be a chronic bony density/remote injury.  A fractured osteophyte is also a consideration.  There is some adjacent soft tissue swelling.   Original Report Authenticated By: Britta Mccreedy, M.D.     No diagnosis found.  1. Hand pain    MDM  Redness appeared to be rapidly spreading. IV antibiotics ordered. Multiple rechecks without further worsening of symptoms. He remains tender. Discussed with Dr. Janee Morn (Hand) who advised outpatient follow up. He remains afebrile. Stable for discharge. 24 hour recheck in ED. DDx: cellulitis vs inflammatory process       I personally performed the services described in this documentation, which was scribed in my presence. The recorded information has been reviewed and is accurate.     Arnoldo Hooker, PA-C 05/01/13  2012

## 2013-05-01 NOTE — ED Notes (Signed)
He c/o non-traumatic left hand pain/erythema at #1 M.C.P. Joint area since Fri. (2 days ago).

## 2013-05-01 NOTE — ED Provider Notes (Signed)
Medical screening examination/treatment/procedure(s) were conducted as a shared visit with non-physician practitioner(s) and myself.  I personally evaluated the patient during the encounter  Yong Grieser, MD 05/01/13 2109 

## 2013-05-01 NOTE — ED Notes (Signed)
[id number]<[id number]> Expected date:<BR> Expected time:<BR> Means of arrival:<BR> Comments:<BR> Hold for triage

## 2013-05-01 NOTE — Progress Notes (Signed)
Orthopedic Tech Progress Note Patient Details:  Jerry Shaffer 01-Jun-1961 [id number] Velcro Thumb spica applied to Left hand. Tolerated well.  Ortho Devices Type of Ortho Device: Thumb velcro splint Ortho Device/Splint Location: Left Thumb spica Ortho Device/Splint Interventions: Application   Asia R Thompson 05/01/2013, 7:16 PM

## 2013-05-02 ENCOUNTER — Encounter (HOSPITAL_COMMUNITY): Payer: Self-pay | Admitting: Emergency Medicine

## 2013-05-02 DIAGNOSIS — F3189 Other bipolar disorder: Secondary | ICD-10-CM | POA: Diagnosis present

## 2013-05-02 DIAGNOSIS — F17200 Nicotine dependence, unspecified, uncomplicated: Secondary | ICD-10-CM | POA: Diagnosis present

## 2013-05-02 DIAGNOSIS — Z8673 Personal history of transient ischemic attack (TIA), and cerebral infarction without residual deficits: Secondary | ICD-10-CM

## 2013-05-02 DIAGNOSIS — Z880 Allergy status to penicillin: Secondary | ICD-10-CM

## 2013-05-02 DIAGNOSIS — G43909 Migraine, unspecified, not intractable, without status migrainosus: Secondary | ICD-10-CM | POA: Diagnosis present

## 2013-05-02 DIAGNOSIS — K219 Gastro-esophageal reflux disease without esophagitis: Secondary | ICD-10-CM | POA: Diagnosis present

## 2013-05-02 DIAGNOSIS — E785 Hyperlipidemia, unspecified: Secondary | ICD-10-CM | POA: Diagnosis present

## 2013-05-02 DIAGNOSIS — E119 Type 2 diabetes mellitus without complications: Secondary | ICD-10-CM | POA: Diagnosis present

## 2013-05-02 DIAGNOSIS — Z7982 Long term (current) use of aspirin: Secondary | ICD-10-CM

## 2013-05-02 DIAGNOSIS — J438 Other emphysema: Secondary | ICD-10-CM | POA: Diagnosis present

## 2013-05-02 DIAGNOSIS — L02519 Cutaneous abscess of unspecified hand: Principal | ICD-10-CM | POA: Diagnosis present

## 2013-05-02 MED ORDER — CLINDAMYCIN PHOSPHATE 900 MG/50ML IV SOLN: 900.0000 mg | Freq: Once | INTRAVENOUS | Status: DC

## 2013-05-02 MED ORDER — SODIUM CHLORIDE 0.9 % IV SOLN
Freq: Once | INTRAVENOUS | Status: AC
  Administered 2013-05-03: via INTRAVENOUS

## 2013-05-02 MED ORDER — VANCOMYCIN HCL IN DEXTROSE 1-5 GM/200ML-% IV SOLN
1000.0000 mg | Freq: Once | INTRAVENOUS | Status: AC
  Administered 2013-05-03: 1000 mg via INTRAVENOUS
  Filled 2013-05-02: qty 200

## 2013-05-02 MED ORDER — MORPHINE SULFATE 2 MG/ML IJ SOLN
4.0000 mg | Freq: Once | INTRAMUSCULAR | Status: DC
  Filled 2013-05-02: qty 2

## 2013-05-02 NOTE — ED Notes (Addendum)
PT. REPORTS PROGRESSING LEFT HAND PAIN / SWELLING ONSET Friday , DENIES INJURY , SEEN AT [facility] PRESCRIBED WITH ORAL ANTIBIOTIC / PAIN MEDICATION WITH NO RELIEF / IMPROVEMENT.

## 2013-05-03 ENCOUNTER — Encounter (HOSPITAL_COMMUNITY): Payer: Self-pay | Admitting: Internal Medicine

## 2013-05-03 ENCOUNTER — Inpatient Hospital Stay (HOSPITAL_COMMUNITY): Payer: No Typology Code available for payment source

## 2013-05-03 ENCOUNTER — Inpatient Hospital Stay (HOSPITAL_COMMUNITY)
Admission: EM | Admit: 2013-05-03 | Discharge: 2013-05-05 | DRG: 603 | Disposition: A | Discharge status: Home / Self Care | Payer: No Typology Code available for payment source | Attending: Internal Medicine | Admitting: Internal Medicine

## 2013-05-03 DIAGNOSIS — E119 Type 2 diabetes mellitus without complications: Secondary | ICD-10-CM | POA: Diagnosis present

## 2013-05-03 DIAGNOSIS — R3129 Other microscopic hematuria: Secondary | ICD-10-CM

## 2013-05-03 DIAGNOSIS — F909 Attention-deficit hyperactivity disorder, unspecified type: Secondary | ICD-10-CM

## 2013-05-03 DIAGNOSIS — R079 Chest pain, unspecified: Secondary | ICD-10-CM

## 2013-05-03 DIAGNOSIS — K5730 Diverticulosis of large intestine without perforation or abscess without bleeding: Secondary | ICD-10-CM

## 2013-05-03 DIAGNOSIS — R1032 Left lower quadrant pain: Secondary | ICD-10-CM

## 2013-05-03 DIAGNOSIS — K5792 Diverticulitis of intestine, part unspecified, without perforation or abscess without bleeding: Secondary | ICD-10-CM

## 2013-05-03 DIAGNOSIS — IMO0002 Reserved for concepts with insufficient information to code with codable children: Secondary | ICD-10-CM

## 2013-05-03 DIAGNOSIS — G459 Transient cerebral ischemic attack, unspecified: Secondary | ICD-10-CM

## 2013-05-03 DIAGNOSIS — R51 Headache: Secondary | ICD-10-CM

## 2013-05-03 DIAGNOSIS — L03114 Cellulitis of left upper limb: Secondary | ICD-10-CM | POA: Diagnosis present

## 2013-05-03 DIAGNOSIS — R200 Anesthesia of skin: Secondary | ICD-10-CM

## 2013-05-03 DIAGNOSIS — G43909 Migraine, unspecified, not intractable, without status migrainosus: Secondary | ICD-10-CM

## 2013-05-03 DIAGNOSIS — L02519 Cutaneous abscess of unspecified hand: Principal | ICD-10-CM

## 2013-05-03 DIAGNOSIS — K219 Gastro-esophageal reflux disease without esophagitis: Secondary | ICD-10-CM

## 2013-05-03 LAB — CBC
Hemoglobin: 12.7 g/dL — ABNORMAL LOW (ref 13.0–17.0)
MCH: 28.7 pg (ref 26.0–34.0)
RBC: 4.43 MIL/uL (ref 4.22–5.81)
WBC: 11.1 10*3/uL — ABNORMAL HIGH (ref 4.0–10.5)

## 2013-05-03 LAB — GLUCOSE, CAPILLARY
Glucose-Capillary: 115 mg/dL — ABNORMAL HIGH (ref 70–99)
Glucose-Capillary: 98 mg/dL (ref 70–99)

## 2013-05-03 LAB — BASIC METABOLIC PANEL
BUN: 12 mg/dL (ref 6–23)
Chloride: 101 mEq/L (ref 96–112)
Glucose, Bld: 201 mg/dL — ABNORMAL HIGH (ref 70–99)
Potassium: 4.2 mEq/L (ref 3.5–5.1)

## 2013-05-03 LAB — CBC WITH DIFFERENTIAL/PLATELET
Hemoglobin: 13.4 g/dL (ref 13.0–17.0)
Lymphs Abs: 1.8 10*3/uL (ref 0.7–4.0)
MCH: 28.7 pg (ref 26.0–34.0)
Monocytes Relative: 6 % (ref 3–12)
Neutro Abs: 7.7 10*3/uL (ref 1.7–7.7)
Neutrophils Relative %: 74 % (ref 43–77)
RBC: 4.67 MIL/uL (ref 4.22–5.81)

## 2013-05-03 LAB — CREATININE, SERUM
Creatinine, Ser: 0.73 mg/dL (ref 0.50–1.35)
GFR calc Af Amer: >90 mL/min (ref >90)

## 2013-05-03 LAB — HEMOGLOBIN A1C
Hgb A1c MFr Bld: 7 % — ABNORMAL HIGH (ref <5.7)
Mean Plasma Glucose: 154 mg/dL — ABNORMAL HIGH (ref <117)

## 2013-05-03 MED ORDER — PANTOPRAZOLE SODIUM 40 MG IV SOLR
40.0000 mg | INTRAVENOUS | Status: DC
  Administered 2013-05-03: 40 mg via INTRAVENOUS
  Filled 2013-05-03 (×2): qty 40

## 2013-05-03 MED ORDER — VANCOMYCIN HCL IN DEXTROSE 1-5 GM/200ML-% IV SOLN
1000.0000 mg | Freq: Three times a day (TID) | INTRAVENOUS | Status: DC
  Administered 2013-05-03 – 2013-05-05 (×8): 1000 mg via INTRAVENOUS
  Filled 2013-05-03 (×10): qty 200

## 2013-05-03 MED ORDER — FAMOTIDINE 20 MG PO TABS
20.0000 mg | ORAL_TABLET | Freq: Every day | ORAL | Status: DC
  Administered 2013-05-03 – 2013-05-05 (×3): 20 mg via ORAL
  Filled 2013-05-03 (×3): qty 1

## 2013-05-03 MED ORDER — HEPARIN SODIUM (PORCINE) 5000 UNIT/ML IJ SOLN
5000.0000 [IU] | Freq: Three times a day (TID) | INTRAMUSCULAR | Status: DC
  Filled 2013-05-03 (×3): qty 1

## 2013-05-03 MED ORDER — RENA-VITE PO TABS
1.0000 | ORAL_TABLET | Freq: Every day | ORAL | Status: DC
  Administered 2013-05-03 – 2013-05-04 (×2): 1 via ORAL
  Filled 2013-05-03 (×3): qty 1

## 2013-05-03 MED ORDER — SUCRALFATE 1 G PO TABS
1.0000 g | ORAL_TABLET | Freq: Three times a day (TID) | ORAL | Status: DC
  Administered 2013-05-03 – 2013-05-05 (×10): 1 g via ORAL
  Filled 2013-05-03 (×14): qty 1

## 2013-05-03 MED ORDER — DOXYCYCLINE HYCLATE 100 MG PO TABS
100.0000 mg | ORAL_TABLET | Freq: Two times a day (BID) | ORAL | Status: DC
  Administered 2013-05-03: 100 mg via ORAL
  Filled 2013-05-03 (×3): qty 1

## 2013-05-03 MED ORDER — ASPIRIN EC 81 MG PO TBEC
81.0000 mg | DELAYED_RELEASE_TABLET | Freq: Every day | ORAL | Status: DC
  Administered 2013-05-03 – 2013-05-05 (×3): 81 mg via ORAL
  Filled 2013-05-03 (×3): qty 1

## 2013-05-03 MED ORDER — GLIPIZIDE 10 MG PO TABS
10.0000 mg | ORAL_TABLET | Freq: Every day | ORAL | Status: DC
  Administered 2013-05-03: 10 mg via ORAL
  Filled 2013-05-03 (×2): qty 1

## 2013-05-03 MED ORDER — SENNOSIDES-DOCUSATE SODIUM 8.6-50 MG PO TABS
1.0000 | ORAL_TABLET | Freq: Every day | ORAL | Status: DC
  Administered 2013-05-03 – 2013-05-04 (×2): 1 via ORAL
  Filled 2013-05-03 (×2): qty 1

## 2013-05-03 MED ORDER — COLCHICINE 0.6 MG PO TABS
0.6000 mg | ORAL_TABLET | Freq: Two times a day (BID) | ORAL | Status: DC
  Administered 2013-05-03: 0.6 mg via ORAL
  Filled 2013-05-03 (×3): qty 1

## 2013-05-03 MED ORDER — GADOBENATE DIMEGLUMINE 529 MG/ML IV SOLN
20.0000 mL | Freq: Once | INTRAVENOUS | Status: AC
  Administered 2013-05-03: 20 mL via INTRAVENOUS

## 2013-05-03 MED ORDER — OXYCODONE-ACETAMINOPHEN 5-325 MG PO TABS
2.0000 | ORAL_TABLET | Freq: Four times a day (QID) | ORAL | Status: DC | PRN
  Administered 2013-05-03 – 2013-05-05 (×4): 2 via ORAL
  Filled 2013-05-03 (×4): qty 2

## 2013-05-03 MED ORDER — OXYCODONE-ACETAMINOPHEN 5-325 MG PO TABS
1.0000 | ORAL_TABLET | Freq: Once | ORAL | Status: AC
  Administered 2013-05-03: 1 via ORAL
  Filled 2013-05-03: qty 1

## 2013-05-03 MED ORDER — ALUM HYDROXIDE-MAG TRISILICATE 80-20 MG PO CHEW
4.0000 | CHEWABLE_TABLET | Freq: Three times a day (TID) | ORAL | Status: DC | PRN
  Filled 2013-05-03: qty 4

## 2013-05-03 MED ORDER — NAPROXEN 500 MG PO TABS
500.0000 mg | ORAL_TABLET | Freq: Two times a day (BID) | ORAL | Status: DC
  Administered 2013-05-04 – 2013-05-05 (×3): 500 mg via ORAL
  Filled 2013-05-03 (×5): qty 1

## 2013-05-03 MED ORDER — METFORMIN HCL 500 MG PO TABS
500.0000 mg | ORAL_TABLET | Freq: Two times a day (BID) | ORAL | Status: DC
  Administered 2013-05-03: 500 mg via ORAL
  Filled 2013-05-03 (×3): qty 1

## 2013-05-03 MED ORDER — LAMOTRIGINE 25 MG PO TABS
50.0000 mg | ORAL_TABLET | Freq: Two times a day (BID) | ORAL | Status: DC
  Administered 2013-05-03 – 2013-05-05 (×5): 50 mg via ORAL
  Filled 2013-05-03 (×6): qty 2

## 2013-05-03 MED ORDER — INSULIN ASPART 100 UNIT/ML ~~LOC~~ SOLN: 0.0000 [IU] | Freq: Three times a day (TID) | SUBCUTANEOUS | Status: DC

## 2013-05-03 NOTE — Progress Notes (Signed)
Patient refuses both Heparin and SCDs.  Reports he does not need DVT prophylaxis if he is mobile.  Algis Downs, PA-C [facility] Pager: [phone]

## 2013-05-03 NOTE — Progress Notes (Signed)
ANTIBIOTIC CONSULT NOTE - INITIAL  Pharmacy Consult for Vancomycin Indication: cellulitis  Allergies  Allergen Reactions  . Bee Venom Anaphylaxis  . Penicillins Anaphylaxis  . Procaine Anaphylaxis    Patient Measurements: Height: 5\' 8"  (172.7 cm) Weight: 257 lb 8 oz (116.801 kg) IBW/kg (Calculated) : 68.4 Adjusted Body Weight: 90 kg  Vital Signs: Temp: 98.3 F (36.8 C) (05/20 0509) Temp src: Oral (05/20 0509) BP: 152/88 mmHg (05/20 0509) Pulse Rate: 79 (05/20 0509) Intake/Output from previous day:   Intake/Output from this shift:    Labs:  Recent Labs  05/01/13 1850 05/03/13 0015  WBC 13.7* 10.3  HGB 13.4 13.4  PLT 304 263  CREATININE 0.75 0.69   Estimated Creatinine Clearance: 137.2 ml/min (by C-G formula based on Cr of 0.69). No results found for this basename: VANCOTROUGH, VANCOPEAK, VANCORANDOM, GENTTROUGH, GENTPEAK, GENTRANDOM, TOBRATROUGH, TOBRAPEAK, TOBRARND, AMIKACINPEAK, AMIKACINTROU, AMIKACIN,  in the last 72 hours   Microbiology: No results found for this or any previous visit (from the past 720 hour(s)).  Medical History: Past Medical History  Diagnosis Date  . Transient ischemic attack (TIA)   . Diabetes mellitus   . Bipolar 1 disorder   . Migraine   . Dyslipidemia   . Emphysema   . PONV (postoperative nausea and vomiting)   . Shortness of breath   . Mental disorder     BIPOLAR DISORDER  . Stroke     Medications:  Prescriptions prior to admission  Medication Sig Dispense Refill  . aspirin 81 MG tablet Take 81 mg by mouth 2 (two) times daily.      Marland Kitchen b complex-C-folic acid 1 MG capsule Take 1 capsule by mouth daily.      . clindamycin (CLEOCIN) 150 MG capsule Take 2 capsules (300 mg total) by mouth 3 (three) times daily.  60 capsule  0  . famotidine (PEPCID) 20 MG tablet Take 20 mg by mouth daily.       Marland Kitchen glipiZIDE (GLUCOTROL) 10 MG tablet Take 10 mg by mouth every morning.       Marland Kitchen HYDROcodone-acetaminophen (NORCO/VICODIN) 5-325 MG per  tablet Take 1 tablet by mouth every 6 (six) hours as needed for pain.      Marland Kitchen ibuprofen (ADVIL,MOTRIN) 200 MG tablet Take 200 mg by mouth every 6 (six) hours as needed for pain.      Marland Kitchen lamoTRIgine (LAMICTAL) 25 MG tablet Take 50 mg by mouth 2 (two) times daily.       . metFORMIN (GLUCOPHAGE) 500 MG tablet Take 500 mg by mouth 2 (two) times daily with a meal.        . Multiple Vitamins-Minerals (MULTIVITAMIN WITH MINERALS) tablet Take 1 tablet by mouth every morning.       Marland Kitchen oxyCODONE-acetaminophen (PERCOCET/ROXICET) 5-325 MG per tablet Take 1-2 tablets by mouth every 6 (six) hours as needed for pain.  20 tablet  0  . sulfamethoxazole-trimethoprim (BACTRIM DS) 800-160 MG per tablet Take 1 tablet by mouth 2 (two) times daily.  20 tablet  0   Assessment: 52 yo male with left hand cellulitis for empiric antibiotics.  Vancomycin 1 g IV given in ED at  0030  Goal of Therapy:  Vancomycin trough level 10-15 mcg/ml  Plan:  Vancomycin 1 g IV q8h   Yoshi Mancillas, Gary Fleet 05/03/2013,5:22 AM

## 2013-05-03 NOTE — Care Management Note (Signed)
    Page 1 of 1   05/05/2013     1:39:36 PM   CARE MANAGEMENT NOTE 05/05/2013  Patient:  Jerry Shaffer, Jerry Shaffer   Account Number:  [id number]  Date Initiated:  05/03/2013  Documentation initiated by:  Letha Cape  Subjective/Objective Assessment:   dx cellulitis  admit- lives alone.     Action/Plan:   Anticipated DC Date:  05/05/2013   Anticipated DC Plan:  HOME/SELF CARE      DC Planning Services  CM consult  MATCH Program      Choice offered to / List presented to:             Status of service:  Completed, signed off Medicare Important Message given?   (If response is "NO", the following Medicare IM given date fields will be blank) Date Medicare IM given:   Date Additional Medicare IM given:    Discharge Disposition:  HOME/SELF CARE  Per UR Regulation:  Reviewed for med. necessity/level of care/duration of stay  If discussed at Long Length of Stay Meetings, dates discussed:    Comments:  05/05/13 13:38 Letha Cape RN, BSN [phone] patient is for dc today, f/u appt scheduled and patient states he needs ast with meds, asst him with Match Program.  05/03/13 8:09 Letha Cape RN, BSN [phone] patient lives alone, pta indep.  NCM will continue to follow for dc needs.

## 2013-05-03 NOTE — Progress Notes (Signed)
[facility] PROGRESS NOTE  [name] WJX:[id number] DOB: 1961-11-22 DOA: 05/03/2013 PCP: Default, Provider, MD  Assessment/Plan: Cellulitis of left hand: Patient reports hand feels tighter today with even more decreased ROM Appreciate Dr. Carollee Shaffer consultation MRI pending Uric acid pending Blood cultures. Vancomycin per pharmacy.  Percocet for pain.   NIDDM type 2:  Discontinue metformin and Glipizide while inpatient. Start SSI-S.  Monitor cbgs.  BiPolar Stable. Continue Lamictal  Gerd IV protonix Carafate   DVT Prophylaxis:  heparin  Code Status: full Family Communication: Disposition Plan: Inpatient.  to home when able.   Consultants:  Hand Surgery, Dr. Janee Shaffer  Procedures:    Antibiotics:  vancomycin  HPI/Subjective: I have terrible indigestion and my hand is tighter than yesterday.  Objective: Filed Vitals:   05/03/13 0215 05/03/13 0230 05/03/13 0245 05/03/13 0509  BP:  138/87  152/88  Pulse: 90 89 92 79  Temp:    98.3 F (36.8 C)  TempSrc:    Oral  Resp:    22  Height:    5\' 8"  (1.727 m)  Weight:    116.801 kg (257 lb 8 oz)  SpO2: 98% 99% 97% 94%   No intake or output data in the 24 hours ending 05/03/13 1147 Filed Weights   05/03/13 0509  Weight: 116.801 kg (257 lb 8 oz)    Exam:   General:  A&O, NAD, Lying comfortably in bed.  Calm appropriate  Cardiovascular: RRR, no murmurs, rubs or gallops, no lower extremity edema  Respiratory: CTA, no wheeze, crackles, or rales.  No increased work of breathing.  Abdomen: Obese Soft, non-tender, non-distended, + bowel sounds, no masses  Musculoskeletal: Left hand with mild erythema.   distinct tenderness over thenar eminence.  Decreased range of motion  Data Reviewed: Basic Metabolic Panel:  Recent Labs Lab 05/01/13 1850 05/03/13 0015 05/03/13 0535  NA 137 137  --   K 3.7 4.2  --   CL 100 101  --   CO2 23 22  --   GLUCOSE 146* 201*  --   BUN 14 12  --   CREATININE  0.75 0.69 0.73  CALCIUM 10.2 9.0  --    CBC:  Recent Labs Lab 05/01/13 1850 05/03/13 0015 05/03/13 0535  WBC 13.7* 10.3 11.1*  NEUTROABS 10.2* 7.7  --   HGB 13.4 13.4 12.7*  HCT 40.9 40.5 38.6*  MCV 87.0 86.7 87.1  PLT 304 263 266   CBG:  Recent Labs Lab 05/01/13 1715 05/03/13 0810  GLUCAP 159* 115*   Studies: Dg Hand Complete Left  05/01/2013   *RADIOLOGY REPORT*  Clinical Data: Left hand pain and swelling, near the snuff box. No specific injury.  LEFT HAND - COMPLETE 3+ VIEW  Comparison: None.  Findings: The joints of the hand are aligned.  There is a 6 mm bony density adjacent to the first carpometacarpal joint.  This appears corticated, no discrete donor site is visualized.  The bones of the metacarpals and fingers are intact.  No joint space abnormality is identified.  There is some soft tissue stranding adjacent to the first metacarpal.  IMPRESSION: 6 mm bony density adjacent to the first carpometacarpal joint.  No acute fracture cannot be completely excluded.  This could be a chronic bony density/remote injury.  A fractured osteophyte is also a consideration.  There is some adjacent soft tissue swelling.   Original Report Authenticated By: Jerry Shaffer, M.D.    Scheduled Meds: . aspirin EC  81 mg Oral Daily  .  famotidine  20 mg Oral Daily  . heparin  5,000 Units Subcutaneous Q8H  . insulin aspart  0-9 Units Subcutaneous TID WC  . lamoTRIgine  50 mg Oral BID  . multivitamin  1 tablet Oral QHS  . pantoprazole (PROTONIX) IV  40 mg Intravenous Q24H  . senna-docusate  1 tablet Oral QHS  . sucralfate  1 g Oral TID WC & HS  . vancomycin  1,000 mg Intravenous Q8H   Continuous Infusions:   Principal Problem:   Cellulitis of hand, left Active Problems:   DIABETES MELLITUS, CONTROLLED    Conley Canal  [facility] Pager [phone]. If 7PM-7AM, please contact night-coverage at [email or web address], password [facility] 05/03/2013, 11:47 AM  LOS: 0 days

## 2013-05-03 NOTE — ED Provider Notes (Signed)
History     CSN: [id number]  Arrival date & time 05/02/13  2335   First MD Initiated Contact with Patient 05/02/13 2353      Chief Complaint  Patient presents with  . Hand Pain    (Consider location/radiation/quality/duration/timing/severity/associated sxs/prior treatment) Patient is a 52 y.o. male presenting with hand pain. The history is provided by the patient. No language interpreter was used.  Hand Pain The current episode started in the past 7 days. Pertinent negatives include no abdominal pain, chest pain, fever, headaches, nausea or numbness. Associated symptoms comments: The patient returns to the ED tonight as instructed for 24-hour recheck of left hand pain and swelling. He was seen last night, given IV antibiotics and discharged home. He states the pain and swelling are worse than last night when seen. No fever. .    Past Medical History  Diagnosis Date  . Transient ischemic attack (TIA)   . Diabetes mellitus   . Bipolar 1 disorder   . Migraine   . Dyslipidemia   . Emphysema   . PONV (postoperative nausea and vomiting)   . Shortness of breath   . Mental disorder     BIPOLAR DISORDER  . Stroke     Past Surgical History  Procedure Laterality Date  . Lumbar disc surgery    . Hemorroidectomy    . Tonsillectomy and adenoidectomy    . Knee arthroscopy      Family History  Problem Relation Age of Onset  . Diabetes type II    . Coronary artery disease    . Bipolar disorder    . Lung cancer Father     History  Substance Use Topics  . Smoking status: Current Some Day Smoker -- 30 years    Types: Cigarettes  . Smokeless tobacco: Current User    Types: Snuff  . Alcohol Use: No      Review of Systems  Constitutional: Negative for fever.  Respiratory: Negative for shortness of breath.   Cardiovascular: Negative for chest pain.  Gastrointestinal: Negative for nausea and abdominal pain.  Genitourinary: Negative for dysuria.  Musculoskeletal:       See  HPI.  Skin: Positive for color change.  Neurological: Negative for numbness and headaches.    Allergies  Bee venom; Penicillins; and Procaine  Home Medications   Current Outpatient Rx  Name  Route  Sig  Dispense  Refill  . aspirin 81 MG tablet   Oral   Take 81 mg by mouth 2 (two) times daily.         Marland Kitchen b complex-C-folic acid 1 MG capsule   Oral   Take 1 capsule by mouth daily.         . clindamycin (CLEOCIN) 150 MG capsule   Oral   Take 2 capsules (300 mg total) by mouth 3 (three) times daily.   60 capsule   0   . famotidine (PEPCID) 20 MG tablet   Oral   Take 20 mg by mouth daily.          Marland Kitchen glipiZIDE (GLUCOTROL) 10 MG tablet   Oral   Take 10 mg by mouth every morning.          Marland Kitchen HYDROcodone-acetaminophen (NORCO/VICODIN) 5-325 MG per tablet   Oral   Take 1 tablet by mouth every 6 (six) hours as needed for pain.         Marland Kitchen ibuprofen (ADVIL,MOTRIN) 200 MG tablet   Oral   Take 200 mg by  mouth every 6 (six) hours as needed for pain.         Marland Kitchen lamoTRIgine (LAMICTAL) 25 MG tablet   Oral   Take 50 mg by mouth 2 (two) times daily.          . metFORMIN (GLUCOPHAGE) 500 MG tablet   Oral   Take 500 mg by mouth 2 (two) times daily with a meal.           . Multiple Vitamins-Minerals (MULTIVITAMIN WITH MINERALS) tablet   Oral   Take 1 tablet by mouth every morning.          Marland Kitchen oxyCODONE-acetaminophen (PERCOCET/ROXICET) 5-325 MG per tablet   Oral   Take 1-2 tablets by mouth every 6 (six) hours as needed for pain.   20 tablet   0   . sulfamethoxazole-trimethoprim (BACTRIM DS) 800-160 MG per tablet   Oral   Take 1 tablet by mouth 2 (two) times daily.   20 tablet   0     BP 130/83  Pulse 97  Temp(Src) 98.7 F (37.1 C) (Oral)  Resp 14  SpO2 97%  Physical Exam  Constitutional: He is oriented to person, place, and time. He appears well-developed and well-nourished.  HENT:  Head: Normocephalic.  Neck: Normal range of motion. Neck supple.   Pulmonary/Chest: Effort normal.  Musculoskeletal: Normal range of motion.  Left hand markedly swollen thenar surface extending to dorsum. There are markings that run medial to 1st MC indicating borders of redness yesterday and redness now extends over dorsum to 4th MC. There is also redness extending on the volar surface to distal forearm. There is no swelling or tenderness of forearm.  Neurological: He is alert and oriented to person, place, and time.  Skin: Skin is warm and dry. No rash noted.  Psychiatric: He has a normal mood and affect.    ED Course  Procedures (including critical care time)  Labs Reviewed  CBC WITH DIFFERENTIAL  BASIC METABOLIC PANEL   Dg Hand Complete Left  05/01/2013   *RADIOLOGY REPORT*  Clinical Data: Left hand pain and swelling, near the snuff box. No specific injury.  LEFT HAND - COMPLETE 3+ VIEW  Comparison: None.  Findings: The joints of the hand are aligned.  There is a 6 mm bony density adjacent to the first carpometacarpal joint.  This appears corticated, no discrete donor site is visualized.  The bones of the metacarpals and fingers are intact.  No joint space abnormality is identified.  There is some soft tissue stranding adjacent to the first metacarpal.  IMPRESSION: 6 mm bony density adjacent to the first carpometacarpal joint.  No acute fracture cannot be completely excluded.  This could be a chronic bony density/remote injury.  A fractured osteophyte is also a consideration.  There is some adjacent soft tissue swelling.   Original Report Authenticated By: Britta Mccreedy, M.D.     No diagnosis found.  1. Hand cellulitis  MDM  Discussed with [facility] regarding admission for worsening hand cellulitis in a diabetic patient. Pain is controlled in ED, IV Vancomycin running. Patient stable. Waiting for admission.        Arnoldo Hooker, PA-C 05/03/13 0122

## 2013-05-03 NOTE — ED Notes (Signed)
Lab at bedside

## 2013-05-03 NOTE — Consult Note (Signed)
ORTHOPAEDIC CONSULTATION  REQUESTING PHYSICIAN: Jonah Blue, DO  Chief Complaint: left hand pain/redness/swelling  HPI: Jerry Shaffer is a 52 y.o. male diabetic who complains of  Left hand redness, pain and swelling, centered over the dorsum of the 1st [facility].  It has developed over the past several days.  He denies any preceeding trauma or known h/o inflammatory conditions. He was eval'd in ED, placed on po antibiotics, and was deemed to be worse on re-eval 24 hours later.  He was admitted for presumed cellulitis, and is being tx'd with IV antibiotics.  MRI performed today reveals SQ edema, but no involvement of bone/joint and no localized fluid collection.  Past Medical History  Diagnosis Date  . Transient ischemic attack (TIA)   . Diabetes mellitus   . Bipolar 1 disorder   . Migraine   . Dyslipidemia   . Emphysema   . PONV (postoperative nausea and vomiting)   . Shortness of breath   . Mental disorder     BIPOLAR DISORDER  . Stroke    Past Surgical History  Procedure Laterality Date  . Lumbar disc surgery    . Hemorroidectomy    . Tonsillectomy and adenoidectomy    . Knee arthroscopy     History   Social History  . Marital Status: Single    Spouse Name: N/A    Number of Children: N/A  . Years of Education: N/A   Social History Main Topics  . Smoking status: Current Some Day Smoker -- 30 years    Types: Cigarettes  . Smokeless tobacco: Current User    Types: Snuff  . Alcohol Use: No  . Drug Use: No  . Sexually Active: No   Other Topics Concern  . None   Social History Narrative  . None   Family History  Problem Relation Age of Onset  . Diabetes type II    . Coronary artery disease    . Bipolar disorder    . Lung cancer Father    Allergies  Allergen Reactions  . Bee Venom Anaphylaxis  . Penicillins Anaphylaxis  . Procaine Anaphylaxis   Prior to Admission medications   Medication Sig Start Date End Date Taking? Authorizing Provider  aspirin 81 MG  tablet Take 81 mg by mouth 2 (two) times daily. 04/20/12  Yes Laveda Norman, MD  b complex-C-folic acid 1 MG capsule Take 1 capsule by mouth daily.   Yes Historical Provider, MD  clindamycin (CLEOCIN) 150 MG capsule Take 2 capsules (300 mg total) by mouth 3 (three) times daily. 05/01/13  Yes Shari A Upstill, PA-C  famotidine (PEPCID) 20 MG tablet Take 20 mg by mouth daily.    Yes Historical Provider, MD  glipiZIDE (GLUCOTROL) 10 MG tablet Take 10 mg by mouth every morning.    Yes Historical Provider, MD  HYDROcodone-acetaminophen (NORCO/VICODIN) 5-325 MG per tablet Take 1 tablet by mouth every 6 (six) hours as needed for pain.   Yes Historical Provider, MD  ibuprofen (ADVIL,MOTRIN) 200 MG tablet Take 200 mg by mouth every 6 (six) hours as needed for pain.   Yes Historical Provider, MD  lamoTRIgine (LAMICTAL) 25 MG tablet Take 50 mg by mouth 2 (two) times daily.    Yes Historical Provider, MD  metFORMIN (GLUCOPHAGE) 500 MG tablet Take 500 mg by mouth 2 (two) times daily with a meal.     Yes Historical Provider, MD  Multiple Vitamins-Minerals (MULTIVITAMIN WITH MINERALS) tablet Take 1 tablet by mouth every morning.  Yes Historical Provider, MD  oxyCODONE-acetaminophen (PERCOCET/ROXICET) 5-325 MG per tablet Take 1-2 tablets by mouth every 6 (six) hours as needed for pain. 05/01/13  Yes Shari A Upstill, PA-C  sulfamethoxazole-trimethoprim (BACTRIM DS) 800-160 MG per tablet Take 1 tablet by mouth 2 (two) times daily. 05/01/13  Yes Arnoldo Hooker, PA-C   Mr Hand Left W Wo Contrast  05/03/2013   *RADIOLOGY REPORT*  Clinical Data: Left hand swelling with cellulitis.  Question abscess.  MRI OF THE LEFT HAND WITHOUT AND WITH CONTRAST  Technique:  Multiplanar, multisequence MR imaging was performed both before and after administration of intravenous contrast.  Contrast: 20mL MULTIHANCE GADOBENATE DIMEGLUMINE 529 MG/ML IV SOLN  Comparison: Radiographs 05/01/2013.  Findings: There is diffuse subcutaneous edema  throughout the hand, especially dorsal to the first, fourth and fifth metacarpals. Postcontrast, there is ill-defined subcutaneous fluid in these areas, best seen on the axial images.  There is no well-defined, focal fluid collection.  The subcutaneous tissues otherwise enhance homogeneously.  There is no muscular fluid collection or abnormal muscular enhancement.  No significant tenosynovitis is identified.  There is no evidence of bone destruction, acute fracture or suspicious synovial enhancement.  The radiodensity adjacent to the first carpal metacarpal articulation on the prior radiographs is not well visualized.  I suspect this is dystrophic capsular calcification related to an old injury although could reflect hydroxyapatite deposition.  IMPRESSION:  1.  Diffuse hand cellulitis with ill-defined subcutaneous fluid collections dorsal to the first, fourth and fifth metacarpals as described.  No drainable abscess identified. 2.  No evidence of deep abscess, tenosynovitis or septic arthritis. 3.  No evidence of osteomyelitis. 4.  The radiodensity adjacent to the first carpal radiocarpal articulation on radiographs is not well seen and could reflect capsular calcification or hydroxyapatite deposition.  This does not appear to reflect an acute fracture.   Original Report Authenticated By: Carey Bullocks, M.D.   Dg Hand Complete Left  05/01/2013   *RADIOLOGY REPORT*  Clinical Data: Left hand pain and swelling, near the snuff box. No specific injury.  LEFT HAND - COMPLETE 3+ VIEW  Comparison: None.  Findings: The joints of the hand are aligned.  There is a 6 mm bony density adjacent to the first carpometacarpal joint.  This appears corticated, no discrete donor site is visualized.  The bones of the metacarpals and fingers are intact.  No joint space abnormality is identified.  There is some soft tissue stranding adjacent to the first metacarpal.  IMPRESSION: 6 mm bony density adjacent to the first carpometacarpal  joint.  No acute fracture cannot be completely excluded.  This could be a chronic bony density/remote injury.  A fractured osteophyte is also a consideration.  There is some adjacent soft tissue swelling.   Original Report Authenticated By: Britta Mccreedy, M.D.    Positive ROS: All other systems have been reviewed and were otherwise negative with the exception of those mentioned in the HPI and as above.  Physical Exam: Vitals: Refer to EMR. Constitutional:  WD, WN, NAD HEENT:  NCAT, EOMI Neuro/Psych:  Alert & oriented to person, place, and time; appropriate mood & affect Lymphatic: No generalized UE edema or lymphadenopathy Extremities / MSK:  The extremities are normal with respect to appearance, ranges of motion, joint stability, muscle strength/tone, sensation, & perfusion except as otherwise noted:   left hand with swelling and slight redness over radial/dorsal 1st MC, extending across the dorsum of the hand.  He can flex the fingers fairly well and  straightens them well.  He has pain with stressing the thumb extensor tendons.  No pain with thumb IP movement.  No pain with WF/WE/SUP/PRON.  Assessment: Probable soft-tissue infection involving left hand. No present surgical indications, specifically no abscess, no deep space infection, no osteo Cannot fully discard other causes of non-infectious inflammation  Plan: I recommend:  1.  Consider adding Naproxen, 500 mg po BID 2.  Consider empiric colchicine despite normal serum UA level. 3.  Broaden his antibiotic coverage beyond Vanc (recognizing patient also has PCN allergy) 4.  Elevate hand to reduce edema, encourage digital ROM exercises 5.  I will plan to check on his progress tomorrow.   Cliffton Asters Janee Morn, MD     [place] [phone] Orthopaedic & Hand Surgery [facility] Orthopaedic & Sports Medicine [facility] [address] [place], [place]  [address] [phone]

## 2013-05-03 NOTE — Progress Notes (Signed)
Addendum  Patient seen and examined, chart and data base reviewed.  I agree with the above assessment and plan.  For full details please see Mrs. Algis Downs PA note.  No evidence of deep tissue infection, no tenosynovitis, osteomyelitis or septic arthritis per MRI.  Continue IV antibiotics, patient can eat as probably there is no plans for intervention.   Clint Lipps, MD [facility] Pager: [phone] 05/03/2013, 2:52 PM

## 2013-05-03 NOTE — ED Provider Notes (Signed)
Medical screening examination/treatment/procedure(s) were performed by non-physician practitioner and as supervising physician I was immediately available for consultation/collaboration.  Jones Skene, M.D.   Jones Skene, MD 05/03/13 [phone]

## 2013-05-03 NOTE — H&P (Signed)
[facility]  History and Physical  Jerry Shaffer ZOX:[id number] DOB: 06-12-1961 DOA: 05/03/2013  Referring physician: Dr. Rulon Abide PCP: Default, Provider, MD   Chief Complaint: Left hand pain and swelling  HPI:  51 yr. Old WM w/ pmhx significant for Bipolar disorder, Type 2 DM, migraine HA's, remote hx TIA, presents with Left hand pain and swelling. He states the pain in his hand began last Thursday. He does not report any history of trauma to the hand. This progressed to swelling and redness over the next few days which prompted a visit to the ED. He was seen on 5/18 at the time complaining of numbness in thumb of left hand and pain in addition to swelling and redness.  An xray of the hand was performed which showed a 6 mm bony density adjacent to first carpometacarpal joint, with a fracture unable to be excluded. An osteophyte vs a remote injury is also possible.  At that time he was given IV abx, discharged home with bactrim DS tablets and a wrist splint. Today he returned for a recheck of the area and this was noted to have worsened beyond the previous markings. He states the hand is most tender near his left thumb on extension and has now spread proximally beyond his wrist. He states his diabetes is well controlled and he takes all of his medications. He has a severe PCN allergy. He is noted to be afebrile. He was started on IV vancomycin in the ED.  Chart Review:  Reviewed.  Review of Systems:  Complete 12 point review of systems otherwise negative except for that stated in the HPI.  Past Medical History  Diagnosis Date  . Transient ischemic attack (TIA)   . Diabetes mellitus   . Bipolar 1 disorder   . Migraine   . Dyslipidemia   . Emphysema   . PONV (postoperative nausea and vomiting)   . Shortness of breath   . Mental disorder     BIPOLAR DISORDER  . Stroke     Past Surgical History  Procedure Laterality Date  . Lumbar disc surgery    . Hemorroidectomy    .  Tonsillectomy and adenoidectomy    . Knee arthroscopy      Social History:  reports that he has been smoking Cigarettes.  He has been smoking about 0.00 packs per day for the past 30 years. His smokeless tobacco use includes Snuff. He reports that he does not drink alcohol or use illicit drugs.  Allergies  Allergen Reactions  . Bee Venom Anaphylaxis  . Penicillins Anaphylaxis  . Procaine Anaphylaxis    Family History  Problem Relation Age of Onset  . Diabetes type II    . Coronary artery disease    . Bipolar disorder    . Lung cancer Father      Prior to Admission medications   Medication Sig Start Date End Date Taking? Authorizing Provider  aspirin 81 MG tablet Take 81 mg by mouth 2 (two) times daily. 04/20/12  Yes Laveda Norman, MD  b complex-C-folic acid 1 MG capsule Take 1 capsule by mouth daily.   Yes Historical Provider, MD  clindamycin (CLEOCIN) 150 MG capsule Take 2 capsules (300 mg total) by mouth 3 (three) times daily. 05/01/13  Yes Shari A Upstill, PA-C  famotidine (PEPCID) 20 MG tablet Take 20 mg by mouth daily.    Yes Historical Provider, MD  glipiZIDE (GLUCOTROL) 10 MG tablet Take 10 mg by mouth every morning.  Yes Historical Provider, MD  HYDROcodone-acetaminophen (NORCO/VICODIN) 5-325 MG per tablet Take 1 tablet by mouth every 6 (six) hours as needed for pain.   Yes Historical Provider, MD  ibuprofen (ADVIL,MOTRIN) 200 MG tablet Take 200 mg by mouth every 6 (six) hours as needed for pain.   Yes Historical Provider, MD  lamoTRIgine (LAMICTAL) 25 MG tablet Take 50 mg by mouth 2 (two) times daily.    Yes Historical Provider, MD  metFORMIN (GLUCOPHAGE) 500 MG tablet Take 500 mg by mouth 2 (two) times daily with a meal.     Yes Historical Provider, MD  Multiple Vitamins-Minerals (MULTIVITAMIN WITH MINERALS) tablet Take 1 tablet by mouth every morning.    Yes Historical Provider, MD  oxyCODONE-acetaminophen (PERCOCET/ROXICET) 5-325 MG per tablet Take 1-2 tablets by mouth  every 6 (six) hours as needed for pain. 05/01/13  Yes Shari A Upstill, PA-C  sulfamethoxazole-trimethoprim (BACTRIM DS) 800-160 MG per tablet Take 1 tablet by mouth 2 (two) times daily. 05/01/13  Yes Arnoldo Hooker, PA-C   Physical Exam: Filed Vitals:   05/03/13 0200 05/03/13 0215 05/03/13 0230 05/03/13 0245  BP: 137/84  138/87   Pulse: 93 90 89 92  Temp:      TempSrc:      Resp:      SpO2: 97% 98% 99% 97%     General:  AAOx3, NAD.  Eyes: EOMI, PERRLA.  ENT: Moist mucosa.  Neck: supple.  Cardiovascular: S1S2, no m/r/g, RRR.  Respiratory: CTA bilat.  Abdomen: Soft, NT, +BS, no guarding, no distention.  Skin: lymphangitic spread of left hand cellulitis proximally from left thumb to wrist. +warm, erythematous.  Musculoskeletal: Limited ROM of left thumb in extension. He has intact capillary refill in his left hand.  Psychiatric: Appropriate mood.  Neurologic: Sensation intact throughout his left hand and wrist.  Wt Readings from Last 3 Encounters:  07/27/12 251 lb (113.853 kg)  04/19/12 262 lb (118.842 kg)  07/25/10 254 lb 4 oz (115.327 kg)    Labs on Admission:  Basic Metabolic Panel:  Recent Labs Lab 05/01/13 1850 05/03/13 0015  NA 137 137  K 3.7 4.2  CL 100 101  CO2 23 22  GLUCOSE 146* 201*  BUN 14 12  CREATININE 0.75 0.69  CALCIUM 10.2 9.0    CBC:  Recent Labs Lab 05/01/13 1850 05/03/13 0015  WBC 13.7* 10.3  NEUTROABS 10.2* 7.7  HGB 13.4 13.4  HCT 40.9 40.5  MCV 87.0 86.7  PLT 304 263  CBG:  Recent Labs Lab 05/01/13 1715  GLUCAP 159*     Radiological Exams on Admission: Dg Hand Complete Left  05/01/2013   *RADIOLOGY REPORT*  Clinical Data: Left hand pain and swelling, near the snuff box. No specific injury.  LEFT HAND - COMPLETE 3+ VIEW  Comparison: None.  Findings: The joints of the hand are aligned.  There is a 6 mm bony density adjacent to the first carpometacarpal joint.  This appears corticated, no discrete donor site is  visualized.  The bones of the metacarpals and fingers are intact.  No joint space abnormality is identified.  There is some soft tissue stranding adjacent to the first metacarpal.  IMPRESSION: 6 mm bony density adjacent to the first carpometacarpal joint.  No acute fracture cannot be completely excluded.  This could be a chronic bony density/remote injury.  A fractured osteophyte is also a consideration.  There is some adjacent soft tissue swelling.   Original Report Authenticated By: Britta Mccreedy, M.D.  Principal Problem:   Cellulitis of hand, left Active Problems:   DIABETES MELLITUS, CONTROLLED   Assessment/Plan 1. Cellulitis of left hand: Vancomycin per pharmacy. Blood cultures. I don't think he needs a hand surgery consult at this time, but if this does not improve over the next day with IV Vanc, he will need MRI of the L hand to evaluate for underlying fluid collection/abscess. Percocet for pain. If this does not help, will need IV narcotic treatment. 2. NIDDM type 2: Continue home meds. BG check with meals. Will need SSI coverage if persistently elevated in setting of infection. 3. Heparin for DVT proph.  Code Status: FULL Family Communication: patient Disposition Plan/Anticipated LOS: more than 2 midnights.  Time spent: 35 minutes  Jonah Blue, DO  [facility] Team 10  If 7PM-7AM, please contact night-coverage at [email or web address], password [facility] 05/03/2013, 2:47 AM

## 2013-05-04 LAB — BASIC METABOLIC PANEL
CO2: 23 mEq/L (ref 19–32)
Chloride: 101 mEq/L (ref 96–112)
GFR calc non Af Amer: >90 mL/min (ref >90)
Glucose, Bld: 106 mg/dL — ABNORMAL HIGH (ref 70–99)
Potassium: 3.8 mEq/L (ref 3.5–5.1)
Sodium: 138 mEq/L (ref 135–145)

## 2013-05-04 LAB — CBC
HCT: 42 % (ref 39.0–52.0)
Hemoglobin: 13.9 g/dL (ref 13.0–17.0)
MCH: 28.9 pg (ref 26.0–34.0)
RBC: 4.81 MIL/uL (ref 4.22–5.81)

## 2013-05-04 LAB — GLUCOSE, CAPILLARY
Glucose-Capillary: 98 mg/dL (ref 70–99)
Glucose-Capillary: 127 mg/dL — ABNORMAL HIGH (ref 70–99)

## 2013-05-04 LAB — DIFFERENTIAL
Basophils Absolute: 0.1 10*3/uL (ref 0.0–0.1)
Basophils Relative: 1 % (ref 0–1)
Lymphocytes Relative: 26 % (ref 12–46)
Monocytes Relative: 5 % (ref 3–12)
Neutro Abs: 6.7 10*3/uL (ref 1.7–7.7)
Neutrophils Relative %: 66 % (ref 43–77)

## 2013-05-04 MED ORDER — MAGNESIUM HYDROXIDE 400 MG/5ML PO SUSP: 30.0000 mL | Freq: Every day | ORAL | Status: DC | PRN

## 2013-05-04 MED ORDER — CLINDAMYCIN PHOSPHATE 600 MG/50ML IV SOLN
600.0000 mg | Freq: Three times a day (TID) | INTRAVENOUS | Status: DC
  Administered 2013-05-04 – 2013-05-05 (×4): 600 mg via INTRAVENOUS
  Filled 2013-05-04 (×6): qty 50

## 2013-05-04 MED ORDER — IBUPROFEN 200 MG PO TABS: 600.0000 mg | ORAL_TABLET | Freq: Four times a day (QID) | ORAL | Status: DC | PRN

## 2013-05-04 MED ORDER — PANTOPRAZOLE SODIUM 40 MG PO TBEC
40.0000 mg | DELAYED_RELEASE_TABLET | Freq: Every day | ORAL | Status: DC
  Administered 2013-05-04 – 2013-05-05 (×2): 40 mg via ORAL
  Filled 2013-05-04 (×2): qty 1

## 2013-05-04 NOTE — Progress Notes (Signed)
[facility] PROGRESS NOTE  GROVE DEFINA BJY:[id number] DOB: 1961-09-27 DOA: 05/03/2013 PCP: Default, Provider, MD  Assessment/Plan: Cellulitis of left hand: Patient reports hand feels better today.  Less tight Appreciate Dr. Carollee Massed consultation - have added naproxyn,  MRI shows no fracture or abscess Uric acid wnl - colchicine d/c'd Blood cultures pending (NGTD) Antibiotics changed from Doxy to Clindamycin on 5/21, c/w Vanco Percocet for pain.   NIDDM type 2:  Discontinue metformin and Glipizide while inpatient. Start SSI-S.  Monitor cbgs.  BiPolar Stable. Continue Lamictal  Gerd IV protonix Carafate   DVT Prophylaxis:  heparin  Code Status: full Family Communication: Disposition Plan: Inpatient.  to home when able.   Consultants:  Hand Surgery, Dr. Janee Morn  Procedures:    Antibiotics:  vancomycin  HPI/Subjective: Feeling better.    Objective: Filed Vitals:   05/03/13 0509 05/03/13 1358 05/03/13 2153 05/04/13 0455  BP: 152/88 146/82 118/69 118/74  Pulse: 79 93 73 77  Temp: 98.3 F (36.8 C) 98.4 F (36.9 C) 98.4 F (36.9 C) 98.8 F (37.1 C)  TempSrc: Oral Oral Oral Oral  Resp: 22 20 18 20   Height: 5\' 8"  (1.727 m)     Weight: 116.801 kg (257 lb 8 oz)     SpO2: 94% 92% 93% 92%    Intake/Output Summary (Last 24 hours) at 05/04/13 1328 Last data filed at 05/04/13 0300  Gross per 24 hour  Intake    760 ml  Output      0 ml  Net    760 ml   Filed Weights   05/03/13 0509  Weight: 116.801 kg (257 lb 8 oz)    Exam:   General:  A&O, NAD, Lying comfortably in bed.  Calm appropriate  Cardiovascular: RRR, no murmurs, rubs or gallops, no lower extremity edema  Respiratory: CTA, no wheeze, crackles, or rales.  No increased work of breathing.  Abdomen: Obese Soft, non-tender, non-distended, + bowel sounds, no masses  Musculoskeletal: Left hand with mild erythema.   distinct tenderness over thenar eminence.  Decreased range of  motion - but improved 5/21 over 5/20.  Data Reviewed: Basic Metabolic Panel:  Recent Labs Lab 05/01/13 1850 05/03/13 0015 05/03/13 0535 05/04/13 1106  NA 137 137  --  138  K 3.7 4.2  --  3.8  CL 100 101  --  101  CO2 23 22  --  23  GLUCOSE 146* 201*  --  106*  BUN 14 12  --  8  CREATININE 0.75 0.69 0.73 0.72  CALCIUM 10.2 9.0  --  9.4   CBC:  Recent Labs Lab 05/01/13 1850 05/03/13 0015 05/03/13 0535 05/04/13 1106  WBC 13.7* 10.3 11.1* 10.1  NEUTROABS 10.2* 7.7  --  6.7  HGB 13.4 13.4 12.7* 13.9  HCT 40.9 40.5 38.6* 42.0  MCV 87.0 86.7 87.1 87.3  PLT 304 263 266 280   CBG:  Recent Labs Lab 05/03/13 1137 05/03/13 1647 05/03/13 2239 05/04/13 0751 05/04/13 1217  GLUCAP 109* 129* 98 114* 98   Studies: Mr Hand Left W Wo Contrast  05/03/2013   *RADIOLOGY REPORT*  Clinical Data: Left hand swelling with cellulitis.  Question abscess.  MRI OF THE LEFT HAND WITHOUT AND WITH CONTRAST  Technique:  Multiplanar, multisequence MR imaging was performed both before and after administration of intravenous contrast.  Contrast: 20mL MULTIHANCE GADOBENATE DIMEGLUMINE 529 MG/ML IV SOLN  Comparison: Radiographs 05/01/2013.  Findings: There is diffuse subcutaneous edema throughout the hand, especially dorsal to the  first, fourth and fifth metacarpals. Postcontrast, there is ill-defined subcutaneous fluid in these areas, best seen on the axial images.  There is no well-defined, focal fluid collection.  The subcutaneous tissues otherwise enhance homogeneously.  There is no muscular fluid collection or abnormal muscular enhancement.  No significant tenosynovitis is identified.  There is no evidence of bone destruction, acute fracture or suspicious synovial enhancement.  The radiodensity adjacent to the first carpal metacarpal articulation on the prior radiographs is not well visualized.  I suspect this is dystrophic capsular calcification related to an old injury although could reflect  hydroxyapatite deposition.  IMPRESSION:  1.  Diffuse hand cellulitis with ill-defined subcutaneous fluid collections dorsal to the first, fourth and fifth metacarpals as described.  No drainable abscess identified. 2.  No evidence of deep abscess, tenosynovitis or septic arthritis. 3.  No evidence of osteomyelitis. 4.  The radiodensity adjacent to the first carpal radiocarpal articulation on radiographs is not well seen and could reflect capsular calcification or hydroxyapatite deposition.  This does not appear to reflect an acute fracture.   Original Report Authenticated By: Carey Bullocks, M.D.    Scheduled Meds: . aspirin EC  81 mg Oral Daily  . clindamycin (CLEOCIN) IV  600 mg Intravenous Q8H  . famotidine  20 mg Oral Daily  . insulin aspart  0-9 Units Subcutaneous TID WC  . lamoTRIgine  50 mg Oral BID  . multivitamin  1 tablet Oral QHS  . naproxen  500 mg Oral BID WC  . pantoprazole  40 mg Oral Daily  . senna-docusate  1 tablet Oral QHS  . sucralfate  1 g Oral TID WC & HS  . vancomycin  1,000 mg Intravenous Q8H   Continuous Infusions:   Principal Problem:   Cellulitis of hand, left Active Problems:   DIABETES MELLITUS, CONTROLLED    Conley Canal  [facility] Pager [phone]. If 7PM-7AM, please contact night-coverage at [email or web address], password [facility] 05/04/2013, 1:28 PM  LOS: 1 day   Attending Patient seen and examined, agree with the assessment and plan as outlined above. Hand continues to improve, suspect he will be able to go home in am.  S Ghimire

## 2013-05-04 NOTE — Progress Notes (Signed)
Subjective: Resting comfortably, notes some improvement in pain and comfort with ROM   Objective: Vital signs in last 24 hours: Temp:  [98.4 F (36.9 C)-98.8 F (37.1 C)] 98.8 F (37.1 C) (05/21 0455) Pulse Rate:  [73-77] 77 (05/21 0455) Resp:  [18-20] 20 (05/21 0455) BP: (118)/(69-74) 118/74 mmHg (05/21 0455) SpO2:  [92 %-93 %] 92 % (05/21 0455)  Intake/Output from previous day: 05/20 0701 - 05/21 0700 In: 760 [P.O.:360; IV Piggyback:400] Out: -  Intake/Output this shift:     Recent Labs  05/01/13 1850 05/03/13 0015 05/03/13 0535 05/04/13 1106  HGB 13.4 13.4 12.7* 13.9    Recent Labs  05/03/13 0535 05/04/13 1106  WBC 11.1* 10.1  RBC 4.43 4.81  HCT 38.6* 42.0  PLT 266 280    Recent Labs  05/03/13 0015 05/03/13 0535 05/04/13 1106  NA 137  --  138  K 4.2  --  3.8  CL 101  --  101  CO2 22  --  23  BUN 12  --  8  CREATININE 0.69 0.73 0.72  GLUCOSE 201*  --  106*  CALCIUM 9.0  --  9.4   No results found for this basename: LABPT, INR,  in the last 72 hours  Decreased edema and redness, better ROM No fluctuance appreciated  Assessment/Plan: Seems to have now turned the corner.  Consider continuing Naproxen for 2 weeks to help with resolution of inflammation.  I will sign-off, but am happy to re-evaluate this patient if additional concerns develop.   Ceara Wrightson A. 05/04/2013, 4:53 PM

## 2013-05-05 LAB — CBC WITH DIFFERENTIAL/PLATELET
Basophils Absolute: 0.1 10*3/uL (ref 0.0–0.1)
HCT: 39.1 % (ref 39.0–52.0)
Lymphocytes Relative: 27 % (ref 12–46)
Neutro Abs: 5.7 10*3/uL (ref 1.7–7.7)
Platelets: 263 10*3/uL (ref 150–400)
RDW: 13.6 % (ref 11.5–15.5)
WBC: 9.2 10*3/uL (ref 4.0–10.5)

## 2013-05-05 LAB — GLUCOSE, CAPILLARY
Glucose-Capillary: 134 mg/dL — ABNORMAL HIGH (ref 70–99)
Glucose-Capillary: 116 mg/dL — ABNORMAL HIGH (ref 70–99)

## 2013-05-05 MED ORDER — HYDROCODONE-ACETAMINOPHEN 5-325 MG PO TABS: 1.0000 | ORAL_TABLET | Freq: Four times a day (QID) | ORAL | Status: DC | PRN

## 2013-05-05 MED ORDER — CLINDAMYCIN HCL 300 MG PO CAPS: 300.0000 mg | ORAL_CAPSULE | Freq: Three times a day (TID) | ORAL | Status: DC

## 2013-05-05 MED ORDER — NAPROXEN 500 MG PO TABS: 500.0000 mg | ORAL_TABLET | Freq: Two times a day (BID) | ORAL | Status: DC

## 2013-05-05 MED ORDER — METFORMIN HCL 500 MG PO TABS
500.0000 mg | ORAL_TABLET | Freq: Two times a day (BID) | ORAL | Status: DC
  Administered 2013-05-05: 500 mg via ORAL
  Filled 2013-05-05 (×2): qty 1

## 2013-05-05 MED ORDER — GLIPIZIDE 10 MG PO TABS
10.0000 mg | ORAL_TABLET | Freq: Every day | ORAL | Status: DC
  Administered 2013-05-05: 10 mg via ORAL
  Filled 2013-05-05 (×2): qty 1

## 2013-05-05 MED ORDER — PROMETHAZINE HCL 25 MG PO TABS: 25.0000 mg | ORAL_TABLET | Freq: Four times a day (QID) | ORAL | Status: DC | PRN

## 2013-05-05 NOTE — Discharge Summary (Signed)
Physician Discharge Summary  Jerry Shaffer HYQ:[id number] DOB: 07-30-1961 DOA: 05/03/2013  PCP: Default, Provider, MD  Admit date: 05/03/2013 Discharge date: 05/05/2013  Time spent: 35 minutes  Recommendations for Outpatient Follow-up:  1. Follow up with PCP on 6/3 at 1:45 to ensure resolution of cellulitis.  Discharge Diagnoses:  Principal Problem:   Cellulitis of hand, left Active Problems:   DIABETES MELLITUS, CONTROLLED   Discharge Condition: improved and stable.  Diet recommendation: diabetic diet.  Filed Weights   05/03/13 0509  Weight: 116.801 kg (257 lb 8 oz)    History of present illness:  51 yr. Old WM w/ pmhx significant for Bipolar disorder, Type 2 DM, migraine HA's, remote hx TIA, presents with Left hand pain and swelling. He states the pain in his hand began last Thursday. He does not report any history of trauma to the hand. This progressed to swelling and redness over the next few days which prompted a visit to the ED. He was seen on 5/18 at the time complaining of numbness in thumb of left hand and pain in addition to swelling and redness. An xray of the hand was performed which showed a 6 mm bony density adjacent to first carpometacarpal joint, with a fracture unable to be excluded. An osteophyte vs a remote injury is also possible. At that time he was given IV abx, discharged home with bactrim DS tablets and a wrist splint. Today he returned for a recheck of the area and this was noted to have worsened beyond the previous markings. He states the hand is most tender near his left thumb on extension and has now spread proximally beyond his wrist. He states his diabetes is well controlled and he takes all of his medications. He has a severe PCN allergy.  He is noted to be afebrile. He was started on IV vancomycin in the ED.   Hospital Course:   Cellulitis of left hand:  The patient was started on IV vancomycin.  The hand became tighter with decreased ROM overnight.   The next morning hand surgery was called and Dr. Janee Morn agreed to consult.  MRI ruled out fracture and abscess.  Dr. Janee Morn recommended increased ROM exercises, elevation of the hand , Naproxen, and a uric acid level.  The uric acid was normal.  The patient's hand improved over the course of the next 24 hours and his antibiotics were changed to clindamycin.On discharge the swelling and erythema has almost resolved and patient has significant improvement in range of motion.  He was discharged on 5/22 on clinda 300 mg TID and naproxen.   Blood cultures were not final, but showed no growth to date.  NIDDM type 2:  Discontinue metformin and Glipizide while inpatient.  Placed on SSI-S.  Oral medications restarted at D/C.  BiPolar  Stable. Continue Lamictal   Gerd  IV protonix, Carafate   Consultations:  Dr. Janee Morn, Hand Surgery  Discharge Exam: Filed Vitals:   05/03/13 2153 05/04/13 0455 05/04/13 2127 05/05/13 0601  BP: 118/69 118/74 127/86 128/91  Pulse: 73 77 95 80  Temp: 98.4 F (36.9 C) 98.8 F (37.1 C) 98.4 F (36.9 C) 98 F (36.7 C)  TempSrc: Oral Oral Oral Oral  Resp: 18 20 18 18   Height:      Weight:      SpO2: 93% 92% 96% 98%    General: A&O, NAD, sitting in bed. Cardiovascular: rrr, no m/r/g Respiratory: cta no w/c/r Abdomen:  Obese, soft, nt, nd, +BS Extremities:  Left hand with decreased swelling (minimal now), decreased erythema (also minimal) and increased range of motion with    Discharge Instructions      Discharge Orders   Future Orders Complete By Expires     Diet - low sodium heart healthy  As directed     Increase activity slowly  As directed         Medication List    STOP taking these medications       ibuprofen 200 MG tablet  Commonly known as:  ADVIL,MOTRIN     oxyCODONE-acetaminophen 5-325 MG per tablet  Commonly known as:  PERCOCET/ROXICET     sulfamethoxazole-trimethoprim 800-160 MG per tablet  Commonly known as:  BACTRIM DS       TAKE these medications       aspirin 81 MG tablet  Take 81 mg by mouth 2 (two) times daily.     b complex-C-folic acid 1 MG capsule  Take 1 capsule by mouth daily.     clindamycin 300 MG capsule  Commonly known as:  CLEOCIN  Take 1 capsule (300 mg total) by mouth 3 (three) times daily.     famotidine 20 MG tablet  Commonly known as:  PEPCID  Take 20 mg by mouth daily.     glipiZIDE 10 MG tablet  Commonly known as:  GLUCOTROL  Take 10 mg by mouth every morning.     HYDROcodone-acetaminophen 5-325 MG per tablet  Commonly known as:  NORCO/VICODIN  Take 1 tablet by mouth every 6 (six) hours as needed for pain.     lamoTRIgine 25 MG tablet  Commonly known as:  LAMICTAL  Take 50 mg by mouth 2 (two) times daily.     metFORMIN 500 MG tablet  Commonly known as:  GLUCOPHAGE  Take 500 mg by mouth 2 (two) times daily with a meal.     multivitamin with minerals tablet  Take 1 tablet by mouth every morning.     naproxen 500 MG tablet  Commonly known as:  NAPROSYN  Take 1 tablet (500 mg total) by mouth 2 (two) times daily with a meal.     promethazine 25 MG tablet  Commonly known as:  PHENERGAN  Take 1 tablet (25 mg total) by mouth every 6 (six) hours as needed for nausea.       Allergies  Allergen Reactions  . Bee Venom Anaphylaxis  . Penicillins Anaphylaxis  . Procaine Anaphylaxis   Follow-up Information   Follow up with Family Medicine. Schedule an appointment as soon as possible for a visit on 05/17/2013. (1:45 )    Contact information:   [address] [place]  [place] [address]       The results of significant diagnostics from this hospitalization (including imaging, microbiology, ancillary and laboratory) are listed below for reference.    Significant Diagnostic Studies: Mr Hand Left W Wo Contrast  05/03/2013   *RADIOLOGY REPORT*  Clinical Data: Left hand swelling with cellulitis.  Question abscess.  MRI OF THE LEFT HAND WITHOUT AND WITH CONTRAST   Technique:  Multiplanar, multisequence MR imaging was performed both before and after administration of intravenous contrast.  Contrast: 20mL MULTIHANCE GADOBENATE DIMEGLUMINE 529 MG/ML IV SOLN  Comparison: Radiographs 05/01/2013.  Findings: There is diffuse subcutaneous edema throughout the hand, especially dorsal to the first, fourth and fifth metacarpals. Postcontrast, there is ill-defined subcutaneous fluid in these areas, best seen on the axial images.  There is no well-defined, focal fluid collection.  The  subcutaneous tissues otherwise enhance homogeneously.  There is no muscular fluid collection or abnormal muscular enhancement.  No significant tenosynovitis is identified.  There is no evidence of bone destruction, acute fracture or suspicious synovial enhancement.  The radiodensity adjacent to the first carpal metacarpal articulation on the prior radiographs is not well visualized.  I suspect this is dystrophic capsular calcification related to an old injury although could reflect hydroxyapatite deposition.  IMPRESSION:  1.  Diffuse hand cellulitis with ill-defined subcutaneous fluid collections dorsal to the first, fourth and fifth metacarpals as described.  No drainable abscess identified. 2.  No evidence of deep abscess, tenosynovitis or septic arthritis. 3.  No evidence of osteomyelitis. 4.  The radiodensity adjacent to the first carpal radiocarpal articulation on radiographs is not well seen and could reflect capsular calcification or hydroxyapatite deposition.  This does not appear to reflect an acute fracture.   Original Report Authenticated By: Carey Bullocks, M.D.   Dg Hand Complete Left  05/01/2013   *RADIOLOGY REPORT*  Clinical Data: Left hand pain and swelling, near the snuff box. No specific injury.  LEFT HAND - COMPLETE 3+ VIEW  Comparison: None.  Findings: The joints of the hand are aligned.  There is a 6 mm bony density adjacent to the first carpometacarpal joint.  This appears  corticated, no discrete donor site is visualized.  The bones of the metacarpals and fingers are intact.  No joint space abnormality is identified.  There is some soft tissue stranding adjacent to the first metacarpal.  IMPRESSION: 6 mm bony density adjacent to the first carpometacarpal joint.  No acute fracture cannot be completely excluded.  This could be a chronic bony density/remote injury.  A fractured osteophyte is also a consideration.  There is some adjacent soft tissue swelling.   Original Report Authenticated By: Britta Mccreedy, M.D.    Microbiology: Recent Results (from the past 240 hour(s))  CULTURE, BLOOD (ROUTINE X 2)     Status: None   Collection Time    05/03/13  5:34 AM      Result Value Range Status   Specimen Description BLOOD RIGHT ARM   Final   Special Requests BOTTLES DRAWN AEROBIC ONLY 5CC   Final   Culture  Setup Time 05/03/2013 08:10   Final   Culture     Final   Value:        BLOOD CULTURE RECEIVED NO GROWTH TO DATE CULTURE WILL BE HELD FOR 5 DAYS BEFORE ISSUING A FINAL NEGATIVE REPORT   Report Status PENDING   Incomplete  CULTURE, BLOOD (ROUTINE X 2)     Status: None   Collection Time    05/03/13  5:39 AM      Result Value Range Status   Specimen Description BLOOD RIGHT ARM   Final   Special Requests BOTTLES DRAWN AEROBIC ONLY 10CC   Final   Culture  Setup Time 05/03/2013 08:10   Final   Culture     Final   Value:        BLOOD CULTURE RECEIVED NO GROWTH TO DATE CULTURE WILL BE HELD FOR 5 DAYS BEFORE ISSUING A FINAL NEGATIVE REPORT   Report Status PENDING   Incomplete     Labs: Basic Metabolic Panel:  Recent Labs Lab 05/01/13 1850 05/03/13 0015 05/03/13 0535 05/04/13 1106  NA 137 137  --  138  K 3.7 4.2  --  3.8  CL 100 101  --  101  CO2 23 22  --  23  GLUCOSE 146* 201*  --  106*  BUN 14 12  --  8  CREATININE 0.75 0.69 0.73 0.72  CALCIUM 10.2 9.0  --  9.4   CBC:  Recent Labs Lab 05/01/13 1850 05/03/13 0015 05/03/13 0535 05/04/13 1106  05/05/13 0530  WBC 13.7* 10.3 11.1* 10.1 9.2  NEUTROABS 10.2* 7.7  --  6.7 5.7  HGB 13.4 13.4 12.7* 13.9 12.8*  HCT 40.9 40.5 38.6* 42.0 39.1  MCV 87.0 86.7 87.1 87.3 87.7  PLT 304 263 266 280 263   CBG:  Recent Labs Lab 05/04/13 1217 05/04/13 1717 05/04/13 2147 05/05/13 0733 05/05/13 1239  GLUCAP 98 127* 184* 134* 116*     Signed:  Conley Canal  [facility] 05/05/2013, 3:13 PM  Attending Patient seen and examined, agree with the assessment and plan as outlined above. Significantly better today-swelling has almost subsided and erythema has resolved, has tenderness in the snuff box area-but better than yesterday. Stable for discharge. Will be discharged on Clindamycin and Naprosyn  Windell Norfolk MD

## 2013-05-09 LAB — CULTURE, BLOOD (ROUTINE X 2): Culture: NO GROWTH

## 2013-05-13 IMAGING — CT CT HEAD W/O CM
1 series · 16 of 30 positions shown, 20 images · non-contrast
Comparison: 04/19/2012

CLINICAL DATA: Left facial numbness.

CT HEAD WITHOUT CONTRAST
TECHNIQUE: Contiguous axial images were obtained from the base of
the skull through the vertex without contrast.

[Series 2: head routine 4.8 h37s · axial · 0.43mm/px · z∈[+243,+398]mm · 16 of 36 slices shown, 20 images]
[im 2/36  brain]
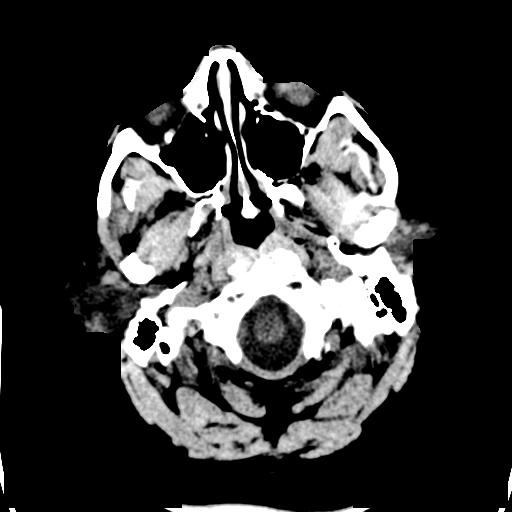
[im 2/36  bone]
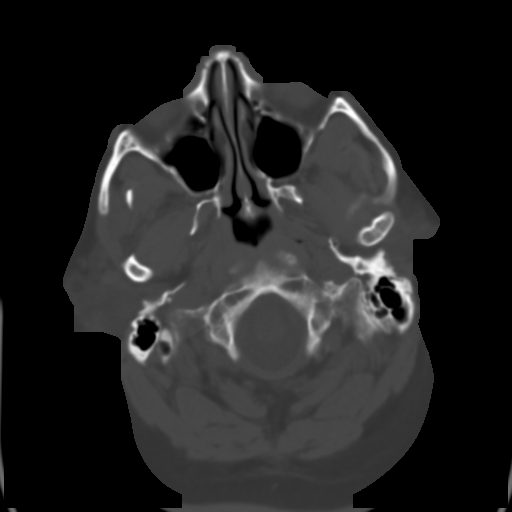
[im 4/36  brain]
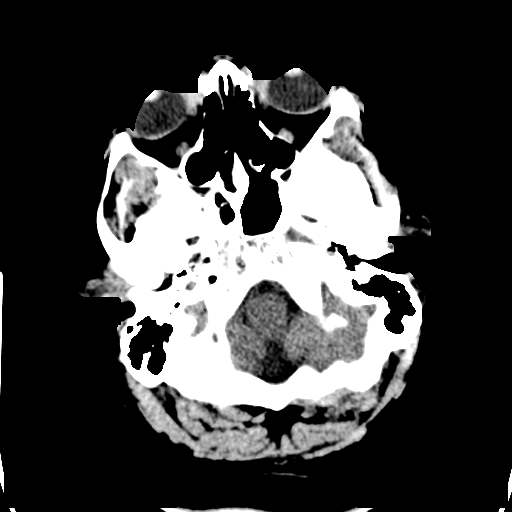
[im 7/36  brain]
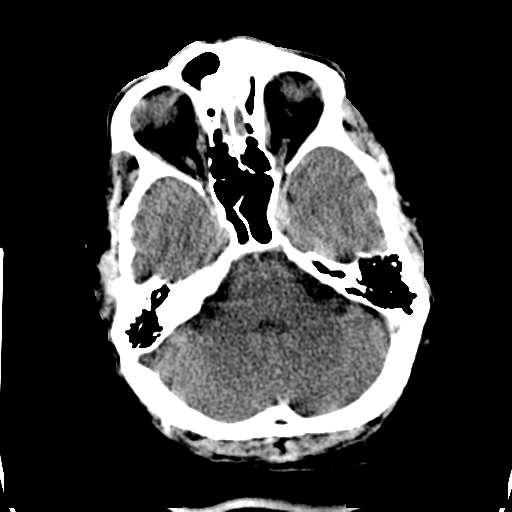
[im 9/36  brain]
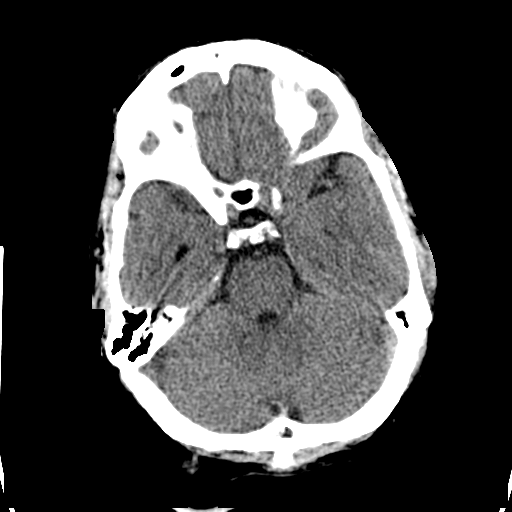
[im 10/36  brain]
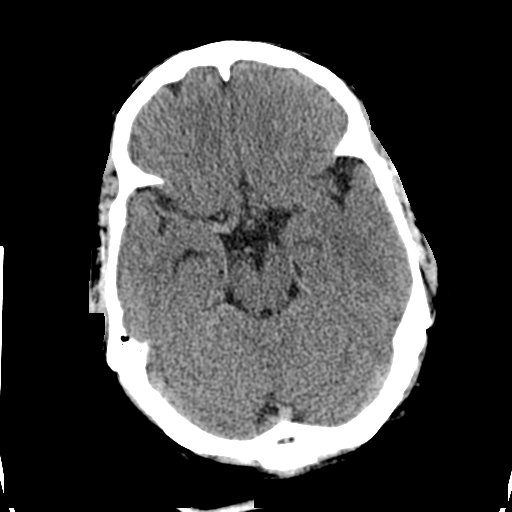
[im 10/36  bone]
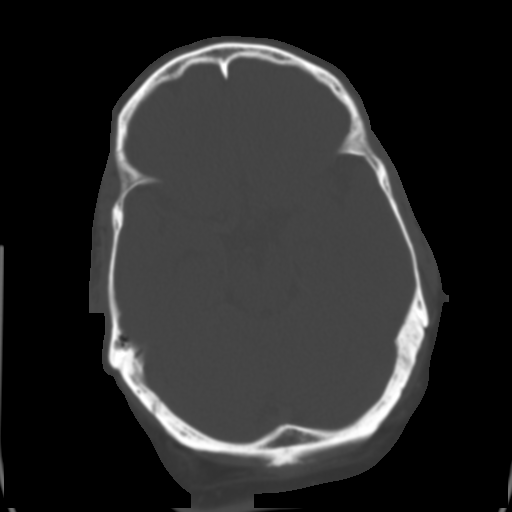
[im 13/36  brain]
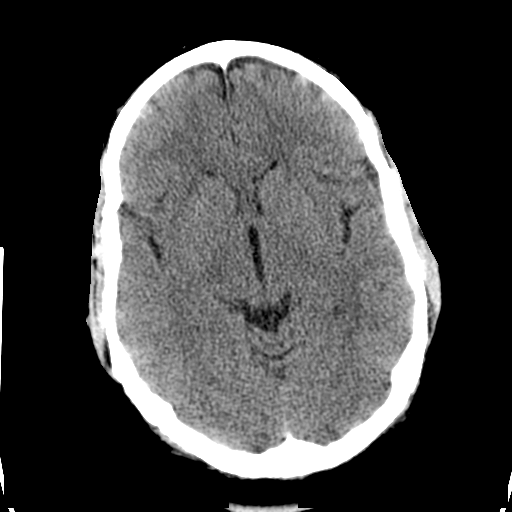
[im 15/36  brain]
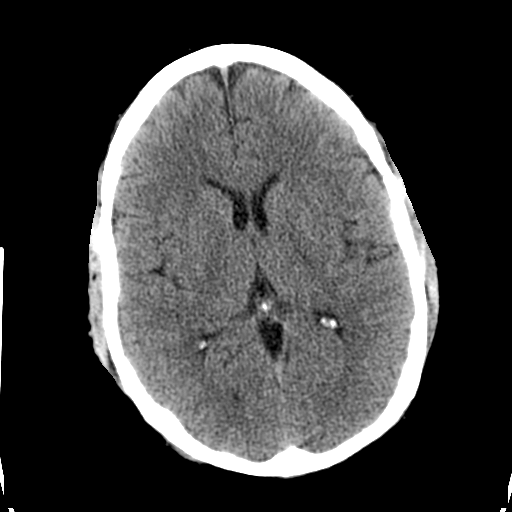
[im 17/36  brain]
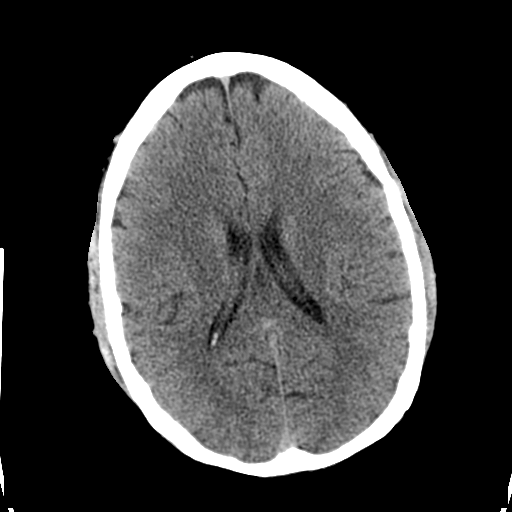
[im 19/36  brain]
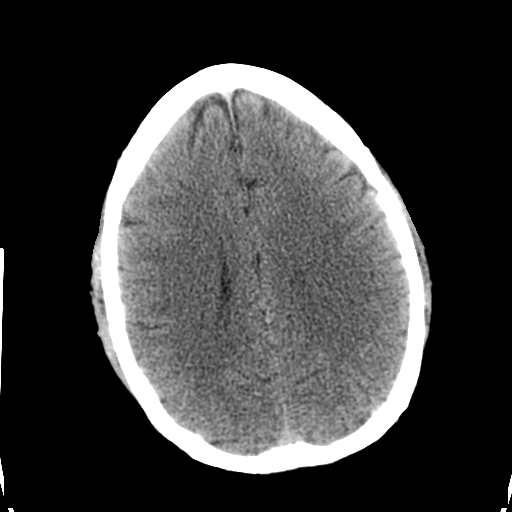
[im 19/36  bone]
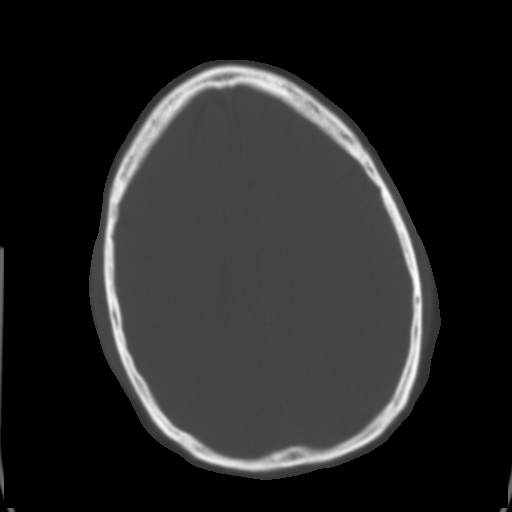
[im 21/36  brain]
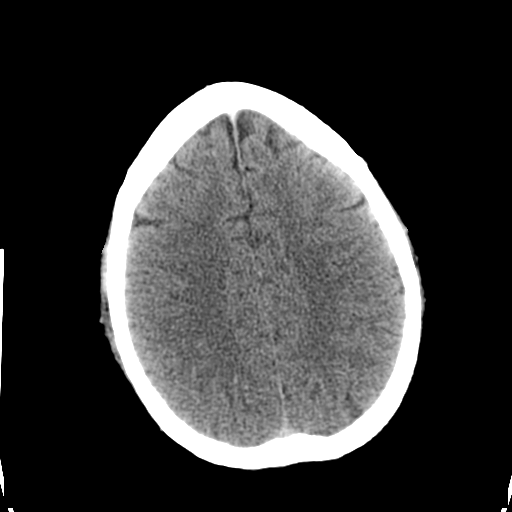
[im 23/36  brain]
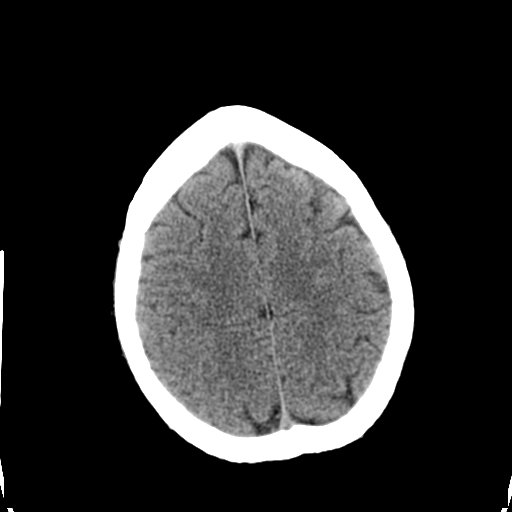
[im 26/36  brain]
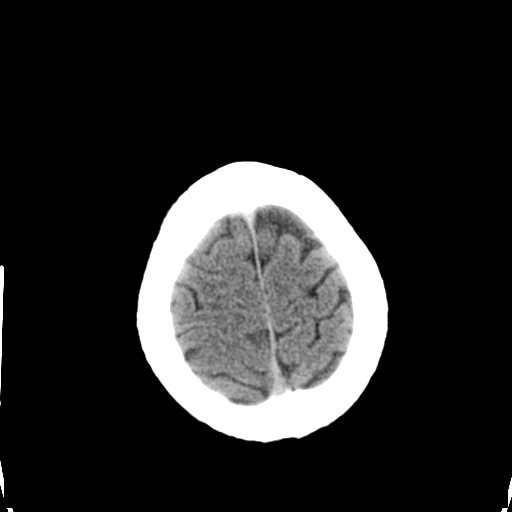
[im 27/36  brain]
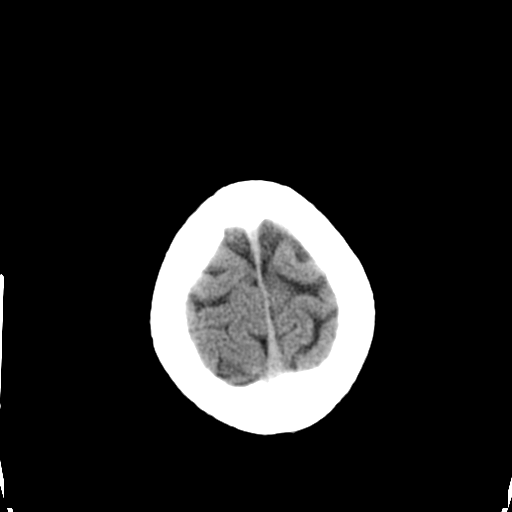
[im 27/36  bone]
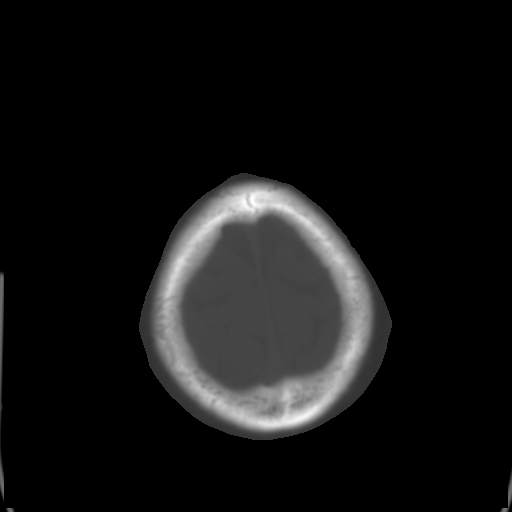
[im 29/36  brain]
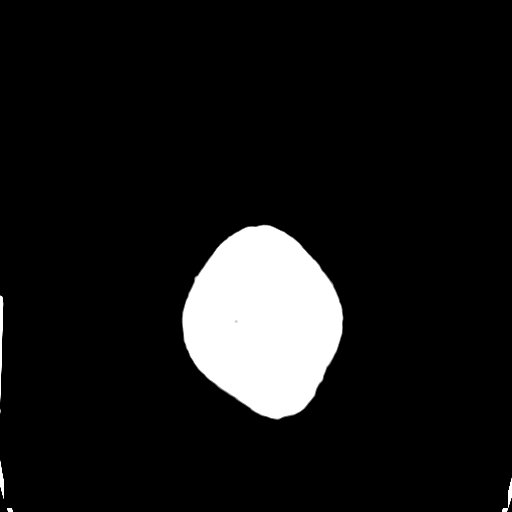
[im 32/36  brain]
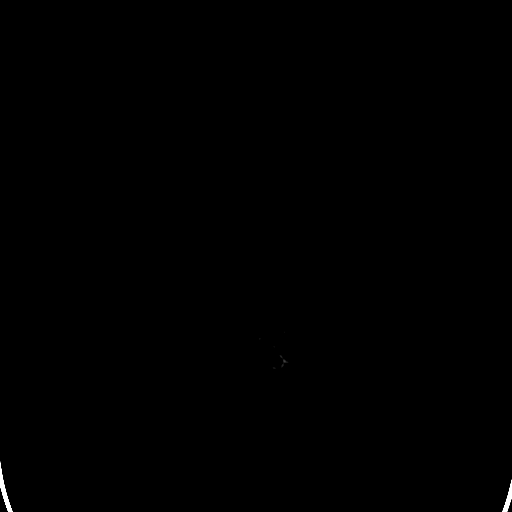
[im 34/36  brain]
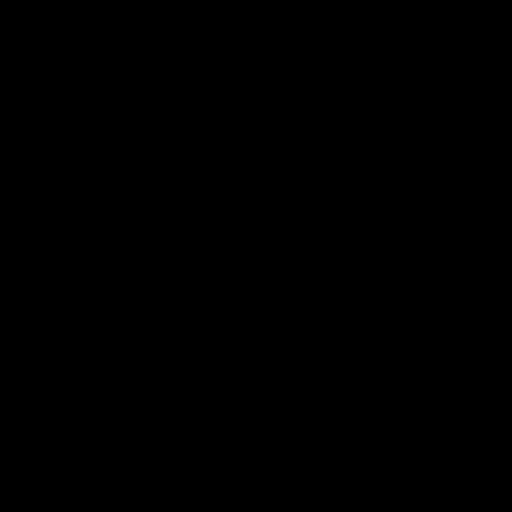

[16 of 30 positions shown; findings below may reference images not displayed]

FINDINGS: There is no evidence of acute intracranial hemorrhage,
brain edema, mass lesion, acute infarction,   mass effect, or
midline shift. Acute infarct may be inapparent on noncontrast CT.
No other intra-axial abnormalities are seen, and the ventricles and
sulci are within normal limits in size and symmetry.   No abnormal
extra-axial fluid collections or masses are identified.  No
significant calvarial abnormality.
IMPRESSION: 1. Negative for bleed or other acute intracranial process.

## 2013-11-07 ENCOUNTER — Ambulatory Visit (INDEPENDENT_AMBULATORY_CARE_PROVIDER_SITE_OTHER): Payer: Self-pay | Admitting: Psychology

## 2013-11-07 DIAGNOSIS — F09 Unspecified mental disorder due to known physiological condition: Secondary | ICD-10-CM

## 2013-12-01 ENCOUNTER — Ambulatory Visit (INDEPENDENT_AMBULATORY_CARE_PROVIDER_SITE_OTHER): Payer: Self-pay | Admitting: Psychology

## 2013-12-01 DIAGNOSIS — G459 Transient cerebral ischemic attack, unspecified: Secondary | ICD-10-CM

## 2013-12-01 DIAGNOSIS — X58XXXA Exposure to other specified factors, initial encounter: Secondary | ICD-10-CM

## 2013-12-01 DIAGNOSIS — S0990XA Unspecified injury of head, initial encounter: Secondary | ICD-10-CM

## 2013-12-01 DIAGNOSIS — F0630 Mood disorder due to known physiological condition, unspecified: Secondary | ICD-10-CM

## 2014-01-02 ENCOUNTER — Ambulatory Visit (INDEPENDENT_AMBULATORY_CARE_PROVIDER_SITE_OTHER): Payer: Self-pay | Admitting: Psychology

## 2014-01-02 DIAGNOSIS — F0630 Mood disorder due to known physiological condition, unspecified: Secondary | ICD-10-CM

## 2014-01-02 DIAGNOSIS — F09 Unspecified mental disorder due to known physiological condition: Secondary | ICD-10-CM

## 2014-01-02 DIAGNOSIS — S0990XA Unspecified injury of head, initial encounter: Secondary | ICD-10-CM

## 2014-01-02 DIAGNOSIS — G459 Transient cerebral ischemic attack, unspecified: Secondary | ICD-10-CM

## 2014-01-02 DIAGNOSIS — X58XXXA Exposure to other specified factors, initial encounter: Secondary | ICD-10-CM

## 2014-02-03 ENCOUNTER — Telehealth (HOSPITAL_COMMUNITY): Payer: Self-pay | Admitting: *Deleted

## 2014-02-03 ENCOUNTER — Encounter (HOSPITAL_COMMUNITY): Payer: Self-pay | Admitting: Psychology
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
## 2014-02-15 ENCOUNTER — Telehealth (HOSPITAL_COMMUNITY): Payer: Self-pay | Admitting: *Deleted

## 2014-10-18 ENCOUNTER — Emergency Department (HOSPITAL_COMMUNITY): Payer: No Typology Code available for payment source

## 2014-10-18 ENCOUNTER — Emergency Department (HOSPITAL_COMMUNITY)
Admission: EM | Admit: 2014-10-18 | Discharge: 2014-10-18 | Disposition: A | Discharge status: Home / Self Care | Payer: No Typology Code available for payment source | Attending: Emergency Medicine | Admitting: Emergency Medicine

## 2014-10-18 DIAGNOSIS — S6992XA Unspecified injury of left wrist, hand and finger(s), initial encounter: Secondary | ICD-10-CM | POA: Diagnosis present

## 2014-10-18 DIAGNOSIS — G43909 Migraine, unspecified, not intractable, without status migrainosus: Secondary | ICD-10-CM | POA: Diagnosis not present

## 2014-10-18 DIAGNOSIS — Z720 Tobacco use: Secondary | ICD-10-CM | POA: Insufficient documentation

## 2014-10-18 DIAGNOSIS — Y9389 Activity, other specified: Secondary | ICD-10-CM | POA: Insufficient documentation

## 2014-10-18 DIAGNOSIS — Z8673 Personal history of transient ischemic attack (TIA), and cerebral infarction without residual deficits: Secondary | ICD-10-CM | POA: Diagnosis not present

## 2014-10-18 DIAGNOSIS — Z791 Long term (current) use of non-steroidal anti-inflammatories (NSAID): Secondary | ICD-10-CM | POA: Insufficient documentation

## 2014-10-18 DIAGNOSIS — Z79899 Other long term (current) drug therapy: Secondary | ICD-10-CM | POA: Insufficient documentation

## 2014-10-18 DIAGNOSIS — Z794 Long term (current) use of insulin: Secondary | ICD-10-CM | POA: Diagnosis not present

## 2014-10-18 DIAGNOSIS — W010XXA Fall on same level from slipping, tripping and stumbling without subsequent striking against object, initial encounter: Secondary | ICD-10-CM | POA: Insufficient documentation

## 2014-10-18 DIAGNOSIS — S63502A Unspecified sprain of left wrist, initial encounter: Secondary | ICD-10-CM | POA: Diagnosis not present

## 2014-10-18 DIAGNOSIS — Z880 Allergy status to penicillin: Secondary | ICD-10-CM | POA: Diagnosis not present

## 2014-10-18 DIAGNOSIS — Y92481 Parking lot as the place of occurrence of the external cause: Secondary | ICD-10-CM | POA: Diagnosis not present

## 2014-10-18 DIAGNOSIS — E119 Type 2 diabetes mellitus without complications: Secondary | ICD-10-CM | POA: Insufficient documentation

## 2014-10-18 DIAGNOSIS — J439 Emphysema, unspecified: Secondary | ICD-10-CM | POA: Diagnosis not present

## 2014-10-18 DIAGNOSIS — Z792 Long term (current) use of antibiotics: Secondary | ICD-10-CM | POA: Insufficient documentation

## 2014-10-18 MED ORDER — IBUPROFEN 800 MG PO TABS: 800.0000 mg | ORAL_TABLET | Freq: Three times a day (TID) | ORAL | Status: DC

## 2014-10-18 MED ORDER — HYDROCODONE-ACETAMINOPHEN 5-325 MG PO TABS: 1.0000 | ORAL_TABLET | ORAL | Status: DC | PRN

## 2014-10-18 NOTE — ED Provider Notes (Signed)
CSN: [id number]     Arrival date & time 10/18/14  2536 History   None    Chief Complaint  Patient presents with  . Wrist Injury   Patient is a 53 y.o. male presenting with wrist injury. The history is provided by the patient. No language interpreter was used.  Wrist Injury  This chart was scribed for non-physician practitioner, Charlann Lange PA-C working with No att. providers found, by Thea Alken, ED Scribe. This patient was seen in room WTR5/WTR5 and the patient's care was started at 7:49 PM.  Jerry Shaffer is a 53 y.o. male who presents to the Emergency Department complaining of left wrist pain. Pt states he slipped in a [employer] parking and landed on the [place] of left hand. Pt is right hand dominate. Pt is soon to have right shoulder surgery by Dr.Graves.    Past Medical History  Diagnosis Date  . Transient ischemic attack (TIA)   . Diabetes mellitus   . Bipolar 1 disorder   . Migraine   . Dyslipidemia   . Emphysema   . PONV (postoperative nausea and vomiting)   . Shortness of breath   . Mental disorder     BIPOLAR DISORDER  . Stroke    Past Surgical History  Procedure Laterality Date  . Lumbar disc surgery    . Hemorroidectomy    . Tonsillectomy and adenoidectomy    . Knee arthroscopy     Family History  Problem Relation Age of Onset  . Diabetes type II    . Coronary artery disease    . Bipolar disorder    . Lung cancer Father    History  Substance Use Topics  . Smoking status: Current Some Day Smoker -- 30 years    Types: Cigarettes  . Smokeless tobacco: Current User    Types: Snuff  . Alcohol Use: No    Review of Systems  Musculoskeletal: Positive for arthralgias.  Skin: Negative for color change, rash and wound.  Neurological: Negative for weakness and numbness.   Allergies  Bee venom; Penicillins; and Procaine  Home Medications   Prior to Admission medications   Medication Sig Start Date End Date Taking? Authorizing Provider  aspirin 81  MG tablet Take 81 mg by mouth 2 (two) times daily. 04/20/12  Yes Monika Salk, MD  b complex-C-folic acid 1 MG capsule Take 1 capsule by mouth daily.   Yes Historical Provider, MD  glipiZIDE (GLUCOTROL) 10 MG tablet Take 10 mg by mouth every morning.    Yes Historical Provider, MD  ibuprofen (ADVIL,MOTRIN) 800 MG tablet Take 800 mg by mouth every 8 (eight) hours as needed for moderate pain (knee pain).   Yes Historical Provider, MD  insulin detemir (LEVEMIR) 100 UNIT/ML injection Inject 25-30 Units into the skin 2 (two) times daily. Take 25 units in am & Take 30 units in pm.   Yes Historical Provider, MD  lamoTRIgine (LAMICTAL) 25 MG tablet Take 50 mg by mouth 2 (two) times daily.    Yes Historical Provider, MD  metFORMIN (GLUCOPHAGE) 500 MG tablet Take 500 mg by mouth 2 (two) times daily with a meal.     Yes Historical Provider, MD  Multiple Vitamins-Minerals (MULTIVITAMIN WITH MINERALS) tablet Take 1 tablet by mouth every morning.    Yes Historical Provider, MD  clindamycin (CLEOCIN) 300 MG capsule Take 1 capsule (300 mg total) by mouth 3 (three) times daily. 05/05/13   Jerry Shaffer York, PA-C  famotidine (PEPCID) 20 MG  tablet Take 20 mg by mouth daily.     Historical Provider, MD  HYDROcodone-acetaminophen (NORCO/VICODIN) 5-325 MG per tablet Take 1 tablet by mouth every 6 (six) hours as needed for pain. 05/05/13   Jerry Shaffer York, PA-C  naproxen (NAPROSYN) 500 MG tablet Take 1 tablet (500 mg total) by mouth 2 (two) times daily with a meal. 05/05/13   Melton Alar, PA-C  promethazine (PHENERGAN) 25 MG tablet Take 1 tablet (25 mg total) by mouth every 6 (six) hours as needed for nausea. 05/05/13   Marianne L York, PA-C   BP 128/78 mmHg  Pulse 78  Temp(Src) 98.5 F (36.9 C)  SpO2 100% Physical Exam  Constitutional: He is oriented to person, place, and time. He appears well-developed and well-nourished. No distress.  HENT:  Head: Normocephalic and atraumatic.  Eyes: Conjunctivae and EOM are normal.   Neck: Neck supple.  Cardiovascular: Normal rate.   Pulmonary/Chest: Effort normal.  Musculoskeletal: Normal range of motion.  Left wrist unremarkable. No significant  swelling discoloration and deformity. Full ROM of all digits in left hand. Fine motor function preserved. Most tenderness to ulnar wrist.   Neurological: He is alert and oriented to person, place, and time.  Skin: Skin is warm and dry.  Psychiatric: He has a normal mood and affect. His behavior is normal.  Nursing note and vitals reviewed.   ED Course  Procedures  DIAGNOSTIC STUDIES: Oxygen Saturation is 100% on RA, normal by my interpretation.    COORDINATION OF CARE: 7:48 PM- Pt advised of plan for treatment and pt agrees.  Labs Review Labs Reviewed - No data to display Dg Wrist Complete Left  10/18/2014   CLINICAL DATA:  Fall and landed on left wrist. Pain along the ulnar side of the wrist.  EXAM: LEFT WRIST - COMPLETE 3+ VIEW  COMPARISON:  05/01/2013  FINDINGS: Normal alignment of the left wrist. Negative for fracture or dislocation. No significant soft tissue swelling. Carpal bones are intact.  IMPRESSION: No acute bone abnormality.   Electronically Signed   By: Markus Daft M.D.   On: 10/18/2014 20:20    Imaging Review No results found.   EKG Interpretation None      MDM   Final diagnoses:  None   1. Left wrist sprain  Negative imaging for fracture, supporting dx of sprain. Splint provided. Orthopedic f/u is no better.   I personally performed the services described in this documentation, which was scribed in my presence. The recorded information has been reviewed and is accurate.      Jerry Oats, PA-C 10/19/14 [place], MD 10/19/14 [phone]

## 2014-10-18 NOTE — Discharge Instructions (Signed)
Cryotherapy °Cryotherapy means treatment with cold. Ice or gel packs can be used to reduce both pain and swelling. Ice is the most helpful within the first 24 to 48 hours after an injury or flare-up from overusing a muscle or joint. Sprains, strains, spasms, burning pain, shooting pain, and aches can all be eased with ice. Ice can also be used when recovering from surgery. Ice is effective, has very few side effects, and is safe for most people to use. °PRECAUTIONS  °Ice is not a safe treatment option for people with: °· Raynaud phenomenon. This is a condition affecting small blood vessels in the extremities. Exposure to cold may cause your problems to return. °· Cold hypersensitivity. There are many forms of cold hypersensitivity, including: °· Cold urticaria. Red, itchy hives appear on the skin when the tissues begin to warm after being iced. °· Cold erythema. This is a red, itchy rash caused by exposure to cold. °· Cold hemoglobinuria. Red blood cells break down when the tissues begin to warm after being iced. The hemoglobin that carry oxygen are passed into the urine because they cannot combine with blood proteins fast enough. °· Numbness or altered sensitivity in the area being iced. °If you have any of the following conditions, do not use ice until you have discussed cryotherapy with your caregiver: °· Heart conditions, such as arrhythmia, angina, or chronic heart disease. °· High blood pressure. °· Healing wounds or open skin in the area being iced. °· Current infections. °· Rheumatoid arthritis. °· Poor circulation. °· Diabetes. °Ice slows the blood flow in the region it is applied. This is beneficial when trying to stop inflamed tissues from spreading irritating chemicals to surrounding tissues. However, if you expose your skin to cold temperatures for too long or without the proper protection, you can damage your skin or nerves. Watch for signs of skin damage due to cold. °HOME CARE INSTRUCTIONS °Follow  these tips to use ice and cold packs safely. °· Place a dry or damp towel between the ice and skin. A damp towel will cool the skin more quickly, so you may need to shorten the time that the ice is used. °· For a more rapid response, add gentle compression to the ice. °· Ice for no more than 10 to 20 minutes at a time. The bonier the area you are icing, the less time it will take to get the benefits of ice. °· Check your skin after 5 minutes to make sure there are no signs of a poor response to cold or skin damage. °· Rest 20 minutes or more between uses. °· Once your skin is numb, you can end your treatment. You can test numbness by very lightly touching your skin. The touch should be so light that you do not see the skin dimple from the pressure of your fingertip. When using ice, most people will feel these normal sensations in this order: cold, burning, aching, and numbness. °· Do not use ice on someone who cannot communicate their responses to pain, such as small children or people with dementia. °HOW TO MAKE AN ICE PACK °Ice packs are the most common way to use ice therapy. Other methods include ice massage, ice baths, and cryosprays. Muscle creams that cause a cold, tingly feeling do not offer the same benefits that ice offers and should not be used as a substitute unless recommended by your caregiver. °To make an ice pack, do one of the following: °· Place crushed ice or a   bag of frozen vegetables in a sealable plastic bag. Squeeze out the excess air. Place this bag inside another plastic bag. Slide the bag into a pillowcase or place a damp towel between your skin and the bag. °· Mix 3 parts water with 1 part rubbing alcohol. Freeze the mixture in a sealable plastic bag. When you remove the mixture from the freezer, it will be slushy. Squeeze out the excess air. Place this bag inside another plastic bag. Slide the bag into a pillowcase or place a damp towel between your skin and the bag. °SEEK MEDICAL CARE  IF: °· You develop white spots on your skin. This may give the skin a blotchy (mottled) appearance. °· Your skin turns blue or pale. °· Your skin becomes waxy or hard. °· Your swelling gets worse. °MAKE SURE YOU:  °· Understand these instructions. °· Will watch your condition. °· Will get help right away if you are not doing well or get worse. °Document Released: 07/28/2011 Document Revised: 04/17/2014 Document Reviewed: 07/28/2011 °[employer]® Patient Information ©2015 [employer]. This information is not intended to replace advice given to you by your health care provider. Make sure you discuss any questions you have with your health care provider. ° °Joint Sprain °A sprain is a tear or stretch in the ligaments that hold a joint together. Severe sprains may need as long as 3-6 weeks of immobilization and/or exercises to heal completely. Sprained joints should be rested and protected. If not, they can become unstable and prone to re-injury. Proper treatment can reduce your pain, shorten the period of disability, and reduce the risk of repeated injuries. °TREATMENT  °· Rest and elevate the injured joint to reduce pain and swelling. °· Apply ice packs to the injury for 20-30 minutes every 2-3 hours for the next 2-3 days. °· Keep the injury wrapped in a compression bandage or splint as long as the joint is painful or as instructed by your caregiver. °· Do not use the injured joint until it is completely healed to prevent re-injury and chronic instability. Follow the instructions of your caregiver. °· Long-term sprain management may require exercises and/or treatment by a physical therapist. Taping or special braces may help stabilize the joint until it is completely better. °SEEK MEDICAL CARE IF:  °· You develop increased pain or swelling of the joint. °· You develop increasing redness and warmth of the joint. °· You develop a fever. °· It becomes stiff. °· Your hand or foot gets cold or numb. °Document Released:  01/08/2005 Document Revised: 02/23/2012 Document Reviewed: 12/18/2008 °[employer]® Patient Information ©2015 [employer]. This information is not intended to replace advice given to you by your health care provider. Make sure you discuss any questions you have with your health care provider. ° °

## 2014-10-18 NOTE — ED Notes (Signed)
Pt states he fell, landing on his L hand and now has L wrist pain. Alert and oriented.

## 2014-11-27 ENCOUNTER — Encounter (HOSPITAL_COMMUNITY): Payer: Self-pay | Admitting: Emergency Medicine

## 2014-11-27 ENCOUNTER — Emergency Department (HOSPITAL_COMMUNITY)
Admission: EM | Admit: 2014-11-27 | Discharge: 2014-11-27 | Disposition: A | Discharge status: Home / Self Care | Payer: No Typology Code available for payment source | Attending: Emergency Medicine | Admitting: Emergency Medicine

## 2014-11-27 DIAGNOSIS — J439 Emphysema, unspecified: Secondary | ICD-10-CM | POA: Insufficient documentation

## 2014-11-27 DIAGNOSIS — H9202 Otalgia, left ear: Secondary | ICD-10-CM | POA: Diagnosis present

## 2014-11-27 DIAGNOSIS — Z880 Allergy status to penicillin: Secondary | ICD-10-CM | POA: Diagnosis not present

## 2014-11-27 DIAGNOSIS — Z792 Long term (current) use of antibiotics: Secondary | ICD-10-CM | POA: Diagnosis not present

## 2014-11-27 DIAGNOSIS — E119 Type 2 diabetes mellitus without complications: Secondary | ICD-10-CM | POA: Diagnosis not present

## 2014-11-27 DIAGNOSIS — Z791 Long term (current) use of non-steroidal anti-inflammatories (NSAID): Secondary | ICD-10-CM | POA: Diagnosis not present

## 2014-11-27 DIAGNOSIS — Z8673 Personal history of transient ischemic attack (TIA), and cerebral infarction without residual deficits: Secondary | ICD-10-CM | POA: Diagnosis not present

## 2014-11-27 DIAGNOSIS — Z720 Tobacco use: Secondary | ICD-10-CM | POA: Insufficient documentation

## 2014-11-27 DIAGNOSIS — Z8659 Personal history of other mental and behavioral disorders: Secondary | ICD-10-CM | POA: Diagnosis not present

## 2014-11-27 DIAGNOSIS — Z79899 Other long term (current) drug therapy: Secondary | ICD-10-CM | POA: Insufficient documentation

## 2014-11-27 DIAGNOSIS — Z794 Long term (current) use of insulin: Secondary | ICD-10-CM | POA: Diagnosis not present

## 2014-11-27 DIAGNOSIS — Z7982 Long term (current) use of aspirin: Secondary | ICD-10-CM | POA: Insufficient documentation

## 2014-11-27 DIAGNOSIS — H6122 Impacted cerumen, left ear: Secondary | ICD-10-CM | POA: Diagnosis not present

## 2014-11-27 NOTE — Discharge Instructions (Signed)
Cerumen Impaction A cerumen impaction is when the wax in your ear forms a plug. This plug usually causes reduced hearing. Sometimes it also causes an earache or dizziness. Removing a cerumen impaction can be difficult and painful. The wax sticks to the ear canal. The canal is sensitive and bleeds easily. If you try to remove a heavy wax buildup with a cotton tipped swab, you may push it in further. Irrigation with water, suction, and small ear curettes may be used to clear out the wax. If the impaction is fixed to the skin in the ear canal, ear drops may be needed for a few days to loosen the wax. People who build up a lot of wax frequently can use ear wax removal products available in your local drugstore. SEEK MEDICAL CARE IF:  You develop an earache, increased hearing loss, or marked dizziness. Document Released: 01/08/2005 Document Revised: 02/23/2012 Document Reviewed: 02/28/2010 [facility] Patient Information 2015 [place], [place]. This information is not intended to replace advice given to you by your health care provider. Make sure you discuss any questions you have with your health care provider.  Emergency Department Resource Guide 1) Find a Doctor and Pay Out of Pocket Although you won't have to find out who is covered by your insurance plan, it is a good idea to ask around and get recommendations. You will then need to call the office and see if the doctor you have chosen will accept you as a new patient and what types of options they offer for patients who are self-pay. Some doctors offer discounts or will set up payment plans for their patients who do not have insurance, but you will need to ask so you aren't surprised when you get to your appointment.  2) Contact Your Local Health Department Not all health departments have doctors that can see patients for sick visits, but many do, so it is worth a call to see if yours does. If you don't know where your local health department is, you can  check in your phone book. The CDC also has a tool to help you locate your state's health department, and many state websites also have listings of all of their local health departments.  3) Find a [facility] [facility] If your illness is not likely to be very severe or complicated, you may want to try a walk in clinic. These are popping up all over the country in pharmacies, drugstores, and shopping centers. They're usually staffed by nurse practitioners or physician assistants that have been trained to treat common illnesses and complaints. They're usually fairly quick and inexpensive. However, if you have serious medical issues or chronic medical problems, these are probably not your best option.  No Primary Care Doctor: - Call Health Connect at  [phone] - they can help you locate a primary care doctor that  accepts your insurance, provides certain services, etc. - Physician Referral Service- [phone]  Chronic Pain Problems: Organization         Address  Phone   Notes  [facility]  [phone] Patients need to be referred by their primary care doctor.   Medication Assistance: Organization         Address  Phone   Notes  [facility] [address] [place], [place] [address] [phone] --Must be a resident of [facility] -- Must have NO insurance coverage whatsoever (no Medicaid/ Medicare, etc.) -- The pt. MUST have a primary care  doctor that directs their care regularly and follows them in the community   [facility]ist  [phone]   [facility]  [phone]    Agencies that provide inexpensive medical care: Organization         Address  Phone   Notes  [place]  [phone]   [facility]    [phone]   [facility] [place], [place] [address] [phone]   [place] [address], [place] [phone]    Planned Parenthood    [phone]   [facility]    [phone]   [place] and [address], [place] Phone:  [phone], Fax:  [phone] Hours of Operation:  9 am - 6 pm, M-F.  Also accepts Medicaid/Medicare and self-pay.  [facility] for [place] [address], [address], [place] Phone: [phone], Fax: [phone]. Hours of Operation:  8:30 am - 5:30 pm, M-F.  Also accepts Medicaid and self-pay.  [facility] [address] [place] Phone: [phone]   [place], [place], [place] [phone], Ext. [phone] Mondays & Thursdays: 7-9 AM.  First 15 patients are seen on a first come, first serve basis.    [place] Providers:  Organization         Address  Phone   Notes  [facility] [address], [place] [phone] Also accepts self-pay patients.  [facility] [address], [place]  [phone]   [place], [address], [place] [phone]   [facility] [address] [place] [phone]   Lucianne Lei [address], [place]   [phone] Only accepts [place] Access [place] patients after they have their name applied to their card.   Self-Pay (no insurance) in [facility]:  Organization         Address  Phone   Notes  Sickle Cell Patients, [facility] [place] [phone]   [facility] [place] [phone]   [facility] [place]  [place], [place], [place] [phone]   [facility]/Dr. Osei-Bonsu  [address] [place] or [address], [place] [phone] Phone number for both [place] and [place] locations is the  same.  [facility] [address] [place] [phone]   [facility] [address] [place] or [address] [phone]   [facility] [address] [place] [phone], phone; [phone], fax Sees patients 1st and 3rd Saturday of every month.  Must not qualify for public or private insurance (i.e. Medicaid, Medicare, [place] Health Choice, Veterans' Benefits)  Household income should be no more than 200% of the poverty level The clinic cannot treat you if you are pregnant or think you are pregnant  Sexually transmitted diseases are not treated at the clinic.    Dental Care: Organization         Address  Phone  Notes  [facility] [address] [place], [place] [phone] Accepts  children up to age 91 who are enrolled in Medicaid or [place] Health Choice; pregnant women with a Medicaid card; and children who have applied for Medicaid or  Health Choice, but were declined, whose parents can pay a reduced fee at time of service.  [facility] Department of [facility]  [address], [place] [phone] Accepts children up to age 54 who are enrolled in [place] or [place]; pregnant women with a Medicaid card; and children who have applied for Medicaid or  Health Choice, but were declined, whose parents can pay a reduced fee at time of service.  [facility] Adult [facility] Access PROGRAM  [place] [phone] Patients are seen by appointment only. Walk-ins are not accepted. [place] will see patients 33 years of age and older. Monday - Tuesday (8am-5pm) Most Wednesdays (8:30-5pm) $30 per visit, cash only  [facility] Adult Dental Access PROGRAM  [address], [facility] [phone] Patients are seen by appointment only. Walk-ins are not accepted. [place] will see patients  61 years of age and older. One Wednesday Evening (Monthly: Volunteer Based).  $30 per visit, cash only  [place]  [phone] for adults; Children under age 59, call Graduate Pediatric Dentistry at [phone]. Children aged 57-14, please call [phone] to request a pediatric application.  Dental services are provided in all areas of dental care including fillings, crowns and bridges, complete and partial dentures, implants, gum treatment, root canals, and extractions. Preventive care is also provided. Treatment is provided to both adults and children. Patients are selected via a lottery and there is often a waiting list.   [facility] [address], [place]  [phone] [email or web address]   [facility] [address] [place], [place] [phone], Ext. [phone] Second and Fourth Thursday of each month, opens at 6:30 AM; Clinic ends at 9 AM.  Patients are seen on a first-come first-served basis, and a limited number are seen during each clinic.   [facility]  [address] Hillard Danker [place], [place] [phone]   Eligibility Requirements You must have lived in [place], [place], or [place] counties for at least the last three months.   You cannot be eligible for state or federal sponsored [employer], including [employer], [place], or [employer].   You generally cannot be eligible for healthcare insurance through your employer.    How to apply: Eligibility screenings are held every Tuesday and Wednesday afternoon from 1:00 pm until 4:00 pm. You do not need an appointment for the interview!  [facility] [address] [place], [place]   [facility]  [place] Department  [place]  [phone]    Behavioral Health Resources in the Community: Intensive Outpatient  Programs Organization         Address  Phone  Notes  [place]. [address], [place], [place] [phone]   [facility] Adult [facility] Services Outpatient [address], [place], [place]   ADS: Alcohol & Drug Svcs [address], [place], [place]   [place] [address],  [place], [place] or [phone]   Substance Abuse Resources Organization         Address  Phone  Notes  Al[facility] and Drug Services  [phone]  [place]  [phone]   [facility] [place]   Chinita Pester  [phone]   Residential & Outpatient Substance Abuse Program  [phone]   Psychological Services Organization         Address  Phone  Notes  [facility] [place]  [place]  [phone]   [place] [address], [place] or [phone]    Mobile Crisis Teams Organization         Address  Phone  Notes  [facility], Mobile Crisis Care Unit  [phone]   [facility]ive Psychotherapeutic Services  [address] [place], [place]   [place] Levels [address] [place] [phone]    Self-Help/Support Groups Organization         Address  Phone             Notes  [place]. of [place] - variety of support groups  [place] Call for more information  Narcotics Anonymous (NA), [facility] [address], [employer] [place]  2 meetings at this location   Special educational needs teacher         Address  Phone  Notes  [facility] [place],    [place]  [phone]   [facility]  [address] [place] [id number], [place], [place]   [place], [place] [phone] Admissions: 8am-3pm M-F  Incentives Substance [place] [address]    [place], [place]  [phone]   [facility] [address] [place], [place], [place]   [facility] [address],  [place], [place]   [facility] - Intensive Outpatient [address] Kristeen [address], [place], [place]   [facility] ([place].) [place].,  [place], [place] [phone] or [phone]   [facility] Services (RTS) [address], [place], [place] Accepts Medicaid  [facility] [place] [address],  [place] [phone] Substance Abuse/Addiction Treatment   [facility] [place] Organization         Address  Phone  Notes  [facility]  [phone]   Domenic Schwab, PhD [address] [place]lis [place], [place]   [phone] or [phone]   [place] [address] [place], [place] [phone]   [facility] [address], [place], [place] [phone] Insurance/Medicaid/sponsorship through [facility] and Families [address], [address]                                    [place], [place] [phone] [place] [address], [place] [phone]    Dr. Adele Schilder  [phone]   [facility] Dept. 1) [address] [place] 2) [place] 3)  [address], [place] [phone]  [phone]   [place] [phone] or [phone] (After Hours)

## 2014-11-27 NOTE — ED Provider Notes (Signed)
CSN: [id number]     Arrival date & time 11/27/14  1629 History  This chart was scribed for non-physician practitioner, Jerry Bible, PA-C working with Jerry Hamburger, MD, by Erling Conte, ED Scribe. This patient was seen in room WTR7/WTR7 and the patient's care was started at 4:52 PM.      Chief Complaint  Patient presents with  . Otalgia    The history is provided by the patient. No language interpreter was used.    HPI Comments: Jerry Shaffer is a 53 y.o. male who presents to the Emergency Department complaining of left otalgia that began this morning. Pt reports that he feels like he has ear wax stuck in his ear and feels pressure in his ear. [place] KitchenHe tried to remove the wax with alcohol, warm water and q-tip with no relief. Denies any ear trauma.  He denies any discharge or blood from his ear, fever, chills, numbness, or vomiting.   Past Medical History  Diagnosis Date  . Transient ischemic attack (TIA)   . Diabetes mellitus   . Bipolar 1 disorder   . Migraine   . Dyslipidemia   . Emphysema   . PONV (postoperative nausea and vomiting)   . Shortness of breath   . Mental disorder     BIPOLAR DISORDER  . Stroke    Past Surgical History  Procedure Laterality Date  . Lumbar disc surgery    . Hemorroidectomy    . Tonsillectomy and adenoidectomy    . Knee arthroscopy     Family History  Problem Relation Age of Onset  . Diabetes type II    . Coronary artery disease    . Bipolar disorder    . Lung cancer Father    History  Substance Use Topics  . Smoking status: Current Some Day Smoker -- 30 years    Types: Cigarettes  . Smokeless tobacco: Current User    Types: Snuff  . Alcohol Use: No    Review of Systems  Constitutional: Negative for fever and chills.  HENT: Positive for ear pain. Negative for ear discharge.   Cardiovascular: Negative for chest pain.  All other systems reviewed and are negative.     Allergies  Bee venom; Penicillins; and  Procaine  Home Medications   Prior to Admission medications   Medication Sig Start Date End Date Taking? Authorizing Provider  aspirin 81 MG tablet Take 81 mg by mouth 2 (two) times daily. 04/20/12   Monika Salk, MD  b complex-C-folic acid 1 MG capsule Take 1 capsule by mouth daily.    Historical Provider, MD  clindamycin (CLEOCIN) 300 MG capsule Take 1 capsule (300 mg total) by mouth 3 (three) times daily. 05/05/13   Bobby Rumpf York, PA-C  famotidine (PEPCID) 20 MG tablet Take 20 mg by mouth daily.     Historical Provider, MD  glipiZIDE (GLUCOTROL) 10 MG tablet Take 10 mg by mouth every morning.     Historical Provider, MD  HYDROcodone-acetaminophen (NORCO/VICODIN) 5-325 MG per tablet Take 1-2 tablets by mouth every 4 (four) hours as needed. 10/18/14   Shari A Upstill, PA-C  ibuprofen (ADVIL,MOTRIN) 800 MG tablet Take 1 tablet (800 mg total) by mouth 3 (three) times daily. 10/18/14   Shari A Upstill, PA-C  insulin detemir (LEVEMIR) 100 UNIT/ML injection Inject 25-30 Units into the skin 2 (two) times daily. Take 25 units in am & Take 30 units in pm.    Historical Provider, MD  lamoTRIgine (LAMICTAL) 25 MG  tablet Take 50 mg by mouth 2 (two) times daily.     Historical Provider, MD  metFORMIN (GLUCOPHAGE) 500 MG tablet Take 500 mg by mouth 2 (two) times daily with a meal.      Historical Provider, MD  Multiple Vitamins-Minerals (MULTIVITAMIN WITH MINERALS) tablet Take 1 tablet by mouth every morning.     Historical Provider, MD  naproxen (NAPROSYN) 500 MG tablet Take 1 tablet (500 mg total) by mouth 2 (two) times daily with a meal. 05/05/13   Bobby Rumpf York, PA-C  promethazine (PHENERGAN) 25 MG tablet Take 1 tablet (25 mg total) by mouth every 6 (six) hours as needed for nausea. 05/05/13   Melton Alar, PA-C   Triage Vitals: BP 131/71 mmHg  Pulse 111  Temp(Src) 99.1 F (37.3 C) (Oral)  Resp 18  SpO2 95%  Physical Exam  Constitutional: He is oriented to person, place, and time. He appears  well-developed and well-nourished. No distress.  HENT:  Head: Normocephalic and atraumatic.  Right Ear: Tympanic membrane, external ear and ear canal normal.  Left Ear: External ear normal. No mastoid tenderness. Tympanic membrane is not erythematous.  Moderate amount of cerumen in left ear canal. Unable to fully visualize TM. No erythema or edema of the EAC. No mastoid tenderness  Eyes: Conjunctivae and EOM are normal.  Neck: Neck supple. No tracheal deviation present.  Cardiovascular: Normal rate, regular rhythm and normal heart sounds.   Pulmonary/Chest: Effort normal and breath sounds normal. No respiratory distress.  Musculoskeletal: Normal range of motion.  Neurological: He is alert and oriented to person, place, and time.  Skin: Skin is warm and dry.  Psychiatric: He has a normal mood and affect. His behavior is normal.  Nursing note and vitals reviewed.   ED Course  Procedures (including critical care time)  DIAGNOSTIC STUDIES: Oxygen Saturation is 95% on RA, adequate by my interpretation.    COORDINATION OF CARE:    Labs Review Labs Reviewed - No data to display  Imaging Review No results found.   EKG Interpretation None     Reassessed patient after ear irrigation.  TM normal of the left ear.   MDM   Final diagnoses:  None   Patient with cerumen impaction of the left ear.  Ear irrigated in the ED and symptoms improved.  No signs of infection at this time.  Patient stable for discharge.  Return precautions given.    Jerry Bible, PA-C 11/27/14 [place], PA-C 11/27/14 4128  Jerry Hamburger, MD 11/28/14 [phone]

## 2014-11-27 NOTE — ED Notes (Signed)
Pt reports he feels like he has ear wax stuck in L ear since this am. Pt tried various approaches at home to remove the wax with no success.

## 2015-01-02 ENCOUNTER — Encounter (HOSPITAL_COMMUNITY): Payer: Self-pay | Admitting: Emergency Medicine

## 2015-01-02 ENCOUNTER — Emergency Department (HOSPITAL_COMMUNITY)
Admission: EM | Admit: 2015-01-02 | Discharge: 2015-01-02 | Disposition: A | Discharge status: Home / Self Care | Payer: No Typology Code available for payment source | Attending: Emergency Medicine | Admitting: Emergency Medicine

## 2015-01-02 DIAGNOSIS — K5792 Diverticulitis of intestine, part unspecified, without perforation or abscess without bleeding: Secondary | ICD-10-CM | POA: Insufficient documentation

## 2015-01-02 DIAGNOSIS — Z8673 Personal history of transient ischemic attack (TIA), and cerebral infarction without residual deficits: Secondary | ICD-10-CM | POA: Insufficient documentation

## 2015-01-02 DIAGNOSIS — F319 Bipolar disorder, unspecified: Secondary | ICD-10-CM | POA: Insufficient documentation

## 2015-01-02 DIAGNOSIS — Z792 Long term (current) use of antibiotics: Secondary | ICD-10-CM | POA: Insufficient documentation

## 2015-01-02 DIAGNOSIS — Z791 Long term (current) use of non-steroidal anti-inflammatories (NSAID): Secondary | ICD-10-CM | POA: Insufficient documentation

## 2015-01-02 DIAGNOSIS — Z794 Long term (current) use of insulin: Secondary | ICD-10-CM | POA: Insufficient documentation

## 2015-01-02 DIAGNOSIS — Z880 Allergy status to penicillin: Secondary | ICD-10-CM | POA: Insufficient documentation

## 2015-01-02 DIAGNOSIS — R1030 Lower abdominal pain, unspecified: Secondary | ICD-10-CM

## 2015-01-02 DIAGNOSIS — E119 Type 2 diabetes mellitus without complications: Secondary | ICD-10-CM | POA: Insufficient documentation

## 2015-01-02 DIAGNOSIS — Z7982 Long term (current) use of aspirin: Secondary | ICD-10-CM | POA: Insufficient documentation

## 2015-01-02 DIAGNOSIS — Z79899 Other long term (current) drug therapy: Secondary | ICD-10-CM | POA: Insufficient documentation

## 2015-01-02 DIAGNOSIS — G43909 Migraine, unspecified, not intractable, without status migrainosus: Secondary | ICD-10-CM | POA: Insufficient documentation

## 2015-01-02 DIAGNOSIS — Z720 Tobacco use: Secondary | ICD-10-CM | POA: Insufficient documentation

## 2015-01-02 LAB — COMPREHENSIVE METABOLIC PANEL
ALK PHOS: 81 U/L (ref 39–117)
ALT: 16 U/L (ref 0–53)
AST: 17 U/L (ref 0–37)
Albumin: 4.5 g/dL (ref 3.5–5.2)
Anion gap: 7 (ref 5–15)
BUN: 16 mg/dL (ref 6–23)
CALCIUM: 9 mg/dL (ref 8.4–10.5)
CO2: 24 mmol/L (ref 19–32)
CREATININE: 0.72 mg/dL (ref 0.50–1.35)
Chloride: 107 mEq/L (ref 96–112)
GFR calc Af Amer: >90 mL/min (ref >90)
Glucose, Bld: 131 mg/dL — ABNORMAL HIGH (ref 70–99)
POTASSIUM: 4 mmol/L (ref 3.5–5.1)
Sodium: 138 mmol/L (ref 135–145)
Total Bilirubin: 0.8 mg/dL (ref 0.3–1.2)
Total Protein: 7.1 g/dL (ref 6.0–8.3)

## 2015-01-02 LAB — CBC WITH DIFFERENTIAL/PLATELET
BASOS PCT: 0 % (ref 0–1)
Basophils Absolute: 0 10*3/uL (ref 0.0–0.1)
Eosinophils Absolute: 0.2 10*3/uL (ref 0.0–0.7)
Eosinophils Relative: 2 % (ref 0–5)
HEMATOCRIT: 43.4 % (ref 39.0–52.0)
HEMOGLOBIN: 14 g/dL (ref 13.0–17.0)
LYMPHS ABS: 1.7 10*3/uL (ref 0.7–4.0)
Lymphocytes Relative: 14 % (ref 12–46)
MCH: 29 pg (ref 26.0–34.0)
MCHC: 32.3 g/dL (ref 30.0–36.0)
MCV: 89.9 fL (ref 78.0–100.0)
Monocytes Absolute: 0.6 10*3/uL (ref 0.1–1.0)
Monocytes Relative: 5 % (ref 3–12)
NEUTROS ABS: 9.4 10*3/uL — AB (ref 1.7–7.7)
Neutrophils Relative %: 79 % — ABNORMAL HIGH (ref 43–77)
Platelets: 310 10*3/uL (ref 150–400)
RBC: 4.83 MIL/uL (ref 4.22–5.81)
RDW: 14.1 % (ref 11.5–15.5)
WBC: 11.9 10*3/uL — AB (ref 4.0–10.5)

## 2015-01-02 LAB — URINALYSIS, ROUTINE W REFLEX MICROSCOPIC
BILIRUBIN URINE: NEGATIVE
Glucose, UA: 100 mg/dL — AB
HGB URINE DIPSTICK: NEGATIVE
Ketones, ur: NEGATIVE mg/dL
Leukocytes, UA: NEGATIVE
Nitrite: NEGATIVE
Protein, ur: NEGATIVE mg/dL
Specific Gravity, Urine: 1.02 (ref 1.005–1.030)
Urobilinogen, UA: 0.2 mg/dL (ref 0.0–1.0)
pH: 5 (ref 5.0–8.0)

## 2015-01-02 LAB — LIPASE, BLOOD: LIPASE: 23 U/L (ref 11–59)

## 2015-01-02 MED ORDER — METRONIDAZOLE 500 MG PO TABS: 500.0000 mg | ORAL_TABLET | Freq: Three times a day (TID) | ORAL | Status: DC

## 2015-01-02 MED ORDER — CIPROFLOXACIN HCL 500 MG PO TABS
500.0000 mg | ORAL_TABLET | Freq: Once | ORAL | Status: AC
  Administered 2015-01-02: 500 mg via ORAL
  Filled 2015-01-02: qty 1

## 2015-01-02 MED ORDER — SODIUM CHLORIDE 0.9 % IV BOLUS (SEPSIS)
1000.0000 mL | Freq: Once | INTRAVENOUS | Status: AC
Start: 2015-01-02 — End: 2015-01-02
  Administered 2015-01-02: 1000 mL via INTRAVENOUS

## 2015-01-02 MED ORDER — CIPROFLOXACIN HCL 500 MG PO TABS: 500.0000 mg | ORAL_TABLET | Freq: Two times a day (BID) | ORAL | Status: DC

## 2015-01-02 MED ORDER — OXYCODONE-ACETAMINOPHEN 5-325 MG PO TABS: 1.0000 | ORAL_TABLET | ORAL | Status: DC | PRN

## 2015-01-02 MED ORDER — PROMETHAZINE HCL 25 MG PO TABS: 25.0000 mg | ORAL_TABLET | Freq: Four times a day (QID) | ORAL | Status: DC | PRN

## 2015-01-02 MED ORDER — METRONIDAZOLE 500 MG PO TABS
500.0000 mg | ORAL_TABLET | Freq: Once | ORAL | Status: AC
  Administered 2015-01-02: 500 mg via ORAL
  Filled 2015-01-02: qty 1

## 2015-01-02 NOTE — ED Provider Notes (Signed)
CSN: [id number]     Arrival date & time 01/02/15  1646 History   First MD Initiated Contact with Patient 01/02/15 1714     Chief Complaint  Patient presents with  . Abdominal Pain   Jerry Shaffer is a 54 y.o. male with a history of diverticulitis, and diabetes who presents the emergency department complaining of midline low abdominal pain since yesterday. Patient describes it as abdominal cramping and sharp. The patient currently rates his pain 1 out of 10 reports it fluctuates in intensity. Patient is unsure about alleviating or aggravating factors. Patient also reports 4 episodes of diarrhea today. The patient reports a history of diverticulitis 3 years ago and reports this pain feels similar. The patient does not have a gastroenterologist. The patient denies previous abdominal surgeries. The patient denies fevers, chills, nausea, vomiting, hematochezia, melena, dysuria, hematuria, urinary frequency, urinary urgency, or sick contacts.  (Consider location/radiation/quality/duration/timing/severity/associated sxs/prior Treatment) HPI  Past Medical History  Diagnosis Date  . Transient ischemic attack (TIA)   . Diabetes mellitus   . Bipolar 1 disorder   . Migraine   . Dyslipidemia   . Emphysema   . PONV (postoperative nausea and vomiting)   . Shortness of breath   . Mental disorder     BIPOLAR DISORDER  . Stroke    Past Surgical History  Procedure Laterality Date  . Lumbar disc surgery    . Hemorroidectomy    . Tonsillectomy and adenoidectomy    . Knee arthroscopy     Family History  Problem Relation Age of Onset  . Diabetes type II    . Coronary artery disease    . Bipolar disorder    . Lung cancer Father    History  Substance Use Topics  . Smoking status: Current Some Day Smoker -- 30 years    Types: Cigarettes  . Smokeless tobacco: Current User    Types: Snuff  . Alcohol Use: No    Review of Systems  Constitutional: Negative for fever and chills.  HENT: Negative  for congestion, sore throat and trouble swallowing.   Eyes: Negative for visual disturbance.  Respiratory: Negative for cough, shortness of breath and wheezing.   Cardiovascular: Negative for chest pain and palpitations.  Gastrointestinal: Positive for abdominal pain and diarrhea. Negative for nausea, vomiting and blood in stool.  Genitourinary: Negative for dysuria, frequency, hematuria, flank pain, decreased urine volume, discharge, penile swelling, scrotal swelling, penile pain and testicular pain.  Musculoskeletal: Negative for back pain and neck pain.  Skin: Negative for rash.  Neurological: Negative for weakness, light-headedness and headaches.      Allergies  Bee venom; Penicillins; and Procaine  Home Medications   Prior to Admission medications   Medication Sig Start Date End Date Taking? Authorizing Provider  aspirin 81 MG tablet Take 81 mg by mouth 2 (two) times daily. 04/20/12  Yes Monika Salk, MD  b complex-C-folic acid 1 MG capsule Take 1 capsule by mouth daily.   Yes Historical Provider, MD  glipiZIDE (GLUCOTROL XL) 10 MG 24 hr tablet Take 10 mg by mouth daily with breakfast.   Yes Historical Provider, MD  HYDROcodone-acetaminophen (NORCO/VICODIN) 5-325 MG per tablet Take 1-2 tablets by mouth every 4 (four) hours as needed. 10/18/14  Yes Shari A Upstill, PA-C  ibuprofen (ADVIL,MOTRIN) 800 MG tablet Take 1 tablet (800 mg total) by mouth 3 (three) times daily. 10/18/14  Yes Shari A Upstill, PA-C  insulin detemir (LEVEMIR) 100 UNIT/ML injection Inject 25-30 Units into  the skin 2 (two) times daily. Take 30 units in am & Take 40 units in pm.   Yes Historical Provider, MD  lamoTRIgine (LAMICTAL) 200 MG tablet Take 200 mg by mouth 2 (two) times daily.   Yes Historical Provider, MD  metFORMIN (GLUCOPHAGE) 1000 MG tablet Take 1,000 mg by mouth 2 (two) times daily with a meal.   Yes Historical Provider, MD  Multiple Vitamins-Minerals (MULTIVITAMIN WITH MINERALS) tablet Take 1 tablet by  mouth every morning.    Yes Historical Provider, MD  ciprofloxacin (CIPRO) 500 MG tablet Take 1 tablet (500 mg total) by mouth every 12 (twelve) hours. 01/02/15   Verda Cumins Minie Roadcap, PA-C  clindamycin (CLEOCIN) 300 MG capsule Take 1 capsule (300 mg total) by mouth 3 (three) times daily. Patient not taking: Reported on 01/02/2015 05/05/13   Melton Alar, PA-C  metroNIDAZOLE (FLAGYL) 500 MG tablet Take 1 tablet (500 mg total) by mouth 3 (three) times daily. 01/02/15   Verda Cumins Helmut Hennon, PA-C  naproxen (NAPROSYN) 500 MG tablet Take 1 tablet (500 mg total) by mouth 2 (two) times daily with a meal. Patient not taking: Reported on 01/02/2015 05/05/13   Melton Alar, PA-C  oxyCODONE-acetaminophen (PERCOCET/ROXICET) 5-325 MG per tablet Take 1-2 tablets by mouth every 4 (four) hours as needed for moderate pain or severe pain. 01/02/15   Verda Cumins Tatyanna Cronk, PA-C  promethazine (PHENERGAN) 25 MG tablet Take 1 tablet (25 mg total) by mouth every 6 (six) hours as needed for nausea. 01/02/15   Verda Cumins Lysle Yero, PA-C   BP 133/83 mmHg  Pulse 80  Temp(Src) 97.8 F (36.6 C) (Oral)  Resp 16  SpO2 98% Physical Exam  Constitutional: He appears well-developed and well-nourished. No distress.  HENT:  Head: Normocephalic and atraumatic.  Mouth/Throat: Oropharynx is clear and moist. No oropharyngeal exudate.  Eyes: Conjunctivae are normal. Pupils are equal, round, and reactive to light. Right eye exhibits no discharge. Left eye exhibits no discharge.  Neck: Neck supple.  Cardiovascular: Normal rate, regular rhythm, normal heart sounds and intact distal pulses.  Exam reveals no gallop and no friction rub.   No murmur heard. Pulmonary/Chest: Effort normal and breath sounds normal. No respiratory distress. He has no wheezes. He has no rales.  Abdominal: Soft. Bowel sounds are normal. He exhibits no distension and no mass. There is tenderness. There is no rebound and no guarding.  Abdomen is soft. Bowel  sounds are present. Patient has suprapubic tenderness to palpation. No right lower quadrant tenderness. Negative Rovsing sign. Negative psoas and obturator sign.  Musculoskeletal: He exhibits no edema.  Lymphadenopathy:    He has no cervical adenopathy.  Neurological: He is alert. Coordination normal.  Skin: Skin is warm and dry. No rash noted. He is not diaphoretic. No erythema. No pallor.  Psychiatric: He has a normal mood and affect. His behavior is normal.  Nursing note and vitals reviewed.   ED Course  Procedures (including critical care time) Labs Review Labs Reviewed  CBC WITH DIFFERENTIAL - Abnormal; Notable for the following:    WBC 11.9 (*)    Neutrophils Relative % 79 (*)    Neutro Abs 9.4 (*)    All other components within normal limits  COMPREHENSIVE METABOLIC PANEL - Abnormal; Notable for the following:    Glucose, Bld 131 (*)    All other components within normal limits  URINALYSIS, ROUTINE W REFLEX MICROSCOPIC - Abnormal; Notable for the following:    Glucose, UA 100 (*)  All other components within normal limits  LIPASE, BLOOD    Imaging Review No results found.   EKG Interpretation None      Filed Vitals:   01/02/15 1652 01/02/15 1654 01/02/15 1858  BP: 147/88  133/83  Pulse: 101  80  Temp:  98.2 F (36.8 C) 97.8 F (36.6 C)  TempSrc:  Oral Oral  Resp:  16 16  SpO2: 97%  98%     MDM   Meds given in ED:  Medications  sodium chloride 0.9 % bolus 1,000 mL (1,000 mLs Intravenous New Bag/Given 01/02/15 1828)  ciprofloxacin (CIPRO) tablet 500 mg (500 mg Oral Given 01/02/15 1947)  metroNIDAZOLE (FLAGYL) tablet 500 mg (500 mg Oral Given 01/02/15 1947)    New Prescriptions   CIPROFLOXACIN (CIPRO) 500 MG TABLET    Take 1 tablet (500 mg total) by mouth every 12 (twelve) hours.   METRONIDAZOLE (FLAGYL) 500 MG TABLET    Take 1 tablet (500 mg total) by mouth 3 (three) times daily.   OXYCODONE-ACETAMINOPHEN (PERCOCET/ROXICET) 5-325 MG PER TABLET    Take  1-2 tablets by mouth every 4 (four) hours as needed for moderate pain or severe pain.    Final diagnoses:  Lower abdominal pain  Acute diverticulitis   This is a 54 year old male with a history of diverticulitis and diabetes who presents to emergency department complaining of low abdominal pain and cramping since yesterday. Patient reports his pain is 1 out of 10 and fluctuates in intensity. Patient also reported diarrhea 4 today. Patient is afebrile and nontoxic-appearing. Patient has mild low abdominal tenderness to palpation. Patient reports his pain feels the same as his previous diverticulitis. Patient had a previous CT scan which is positive for diverticulitis. Patient's urinalysis is negative for infection. Patient has a slightly elevated white count at 11.9. Patient's CMP is unremarkable. Patient's lipase is 23. Patient is tolerating by mouth liquids. Patient does not wish for any pain medicine in the ED. Education provided on treatment of diverticulitis. Education provided on diet and to watch his blood sugars. Patient tolerated oral antibiotics in the ED. We'll discharge this patient with Cipro and Flagyl. Patient also given Phenergan for nausea and Percocet for breakthrough pain. Narcotic pain medicine precautions provided. Strict return cautions were provided. I advised the patient to follow-up with their primary care provider this week. I advised the patient to return to the emergency department with new or worsening symptoms or new concerns. The patient verbalized understanding and agreement with plan.   This patient was discussed with Dr. Alvino Chapel who agrees with assessment and plan.     Hanley Hays, PA-C 01/02/15 2030  Jasper Riling. Alvino Chapel, MD 01/03/15 0001

## 2015-01-02 NOTE — ED Notes (Signed)
Pt c/o low medial abdominal pain, denies dysuria or increase in urinary frequency. Pt states that similar symptoms in the past was Dxed as diverticulitis.

## 2015-01-02 NOTE — Discharge Instructions (Signed)
Diverticulitis Diverticulitis is inflammation or infection of small pouches in your colon that form when you have a condition called diverticulosis. The pouches in your colon are called diverticula. Your colon, or large intestine, is where water is absorbed and stool is formed. Complications of diverticulitis can include:  Bleeding.  Severe infection.  Severe pain.  Perforation of your colon.  Obstruction of your colon. CAUSES  Diverticulitis is caused by bacteria. Diverticulitis happens when stool becomes trapped in diverticula. This allows bacteria to grow in the diverticula, which can lead to inflammation and infection. RISK FACTORS People with diverticulosis are at risk for diverticulitis. Eating a diet that does not include enough fiber from fruits and vegetables may make diverticulitis more likely to develop. SYMPTOMS  Symptoms of diverticulitis may include:  Abdominal pain and tenderness. The pain is normally located on the left side of the abdomen, but may occur in other areas.  Fever and chills.  Bloating.  Cramping.  Nausea.  Vomiting.  Constipation.  Diarrhea.  Blood in your stool. DIAGNOSIS  Your health care provider will ask you about your medical history and do a physical exam. You may need to have tests done because many medical conditions can cause the same symptoms as diverticulitis. Tests may include:  Blood tests.  Urine tests.  Imaging tests of the abdomen, including X-rays and CT scans. When your condition is under control, your health care provider may recommend that you have a colonoscopy. A colonoscopy can show how severe your diverticula are and whether something else is causing your symptoms. TREATMENT  Most cases of diverticulitis are mild and can be treated at home. Treatment may include:  Taking over-the-counter pain medicines.  Following a clear liquid diet.  Taking antibiotic medicines by mouth for 7-10 days. More severe cases may  be treated at a hospital. Treatment may include:  Not eating or drinking.  Taking prescription pain medicine.  Receiving antibiotic medicines through an IV tube.  Receiving fluids and nutrition through an IV tube.  Surgery. HOME CARE INSTRUCTIONS   Follow your health care provider's instructions carefully.  Follow a full liquid diet or other diet as directed by your health care provider. After your symptoms improve, your health care provider may tell you to change your diet. He or she may recommend you eat a high-fiber diet. Fruits and vegetables are good sources of fiber. Fiber makes it easier to pass stool.  Take fiber supplements or probiotics as directed by your health care provider.  Only take medicines as directed by your health care provider.  Keep all your follow-up appointments. SEEK MEDICAL CARE IF:   Your pain does not improve.  You have a hard time eating food.  Your bowel movements do not return to normal. SEEK IMMEDIATE MEDICAL CARE IF:   Your pain becomes worse.  Your symptoms do not get better.  Your symptoms suddenly get worse.  You have a fever.  You have repeated vomiting.  You have bloody or black, tarry stools. MAKE SURE YOU:   Understand these instructions.  Will watch your condition.  Will get help right away if you are not doing well or get worse. Document Released: 09/10/2005 Document Revised: 12/06/2013 Document Reviewed: 10/26/2013 [facility] Patient Information 2015 [place], [place]. This information is not intended to replace advice given to you by your health care provider. Make sure you discuss any questions you have with your health care provider.  Abdominal Pain Many things can cause abdominal pain. Usually, abdominal pain is not  caused by a disease and will improve without treatment. It can often be observed and treated at home. Your health care provider will do a physical exam and possibly order blood tests and X-rays to help  determine the seriousness of your pain. However, in many cases, more time must pass before a clear cause of the pain can be found. Before that point, your health care provider may not know if you need more testing or further treatment. HOME CARE INSTRUCTIONS  Monitor your abdominal pain for any changes. The following actions may help to alleviate any discomfort you are experiencing:  Only take over-the-counter or prescription medicines as directed by your health care provider.  Do not take laxatives unless directed to do so by your health care provider.  Try a clear liquid diet (broth, tea, or water) as directed by your health care provider. Slowly move to a bland diet as tolerated. SEEK MEDICAL CARE IF:  You have unexplained abdominal pain.  You have abdominal pain associated with nausea or diarrhea.  You have pain when you urinate or have a bowel movement.  You experience abdominal pain that wakes you in the night.  You have abdominal pain that is worsened or improved by eating food.  You have abdominal pain that is worsened with eating fatty foods.  You have a fever. SEEK IMMEDIATE MEDICAL CARE IF:   Your pain does not go away within 2 hours.  You keep throwing up (vomiting).  Your pain is felt only in portions of the abdomen, such as the right side or the left lower portion of the abdomen.  You pass bloody or black tarry stools. MAKE SURE YOU:  Understand these instructions.   Will watch your condition.   Will get help right away if you are not doing well or get worse.  Document Released: 09/10/2005 Document Revised: 12/06/2013 Document Reviewed: 08/10/2013 [facility] Patient Information 2015 [place], [place]. This information is not intended to replace advice given to you by your health care provider. Make sure you discuss any questions you have with your health care provider.

## 2015-01-02 NOTE — ED Notes (Signed)
Gave water to help give urine sample

## 2015-01-02 NOTE — ED Notes (Signed)
Patient is unable to urinate at this time, he will call when he can.

## 2015-08-24 ENCOUNTER — Emergency Department (HOSPITAL_COMMUNITY): Payer: 59

## 2015-08-24 ENCOUNTER — Emergency Department (HOSPITAL_COMMUNITY)
Admission: EM | Admit: 2015-08-24 | Discharge: 2015-08-25 | Disposition: A | Discharge status: Home / Self Care | Payer: 59 | Attending: Emergency Medicine | Admitting: Emergency Medicine

## 2015-08-24 ENCOUNTER — Encounter (HOSPITAL_COMMUNITY): Payer: Self-pay | Admitting: Emergency Medicine

## 2015-08-24 DIAGNOSIS — E119 Type 2 diabetes mellitus without complications: Secondary | ICD-10-CM | POA: Diagnosis not present

## 2015-08-24 DIAGNOSIS — Z87891 Personal history of nicotine dependence: Secondary | ICD-10-CM | POA: Insufficient documentation

## 2015-08-24 DIAGNOSIS — Z794 Long term (current) use of insulin: Secondary | ICD-10-CM | POA: Diagnosis not present

## 2015-08-24 DIAGNOSIS — Z8659 Personal history of other mental and behavioral disorders: Secondary | ICD-10-CM | POA: Diagnosis not present

## 2015-08-24 DIAGNOSIS — Z8673 Personal history of transient ischemic attack (TIA), and cerebral infarction without residual deficits: Secondary | ICD-10-CM | POA: Diagnosis not present

## 2015-08-24 DIAGNOSIS — Z7982 Long term (current) use of aspirin: Secondary | ICD-10-CM | POA: Insufficient documentation

## 2015-08-24 DIAGNOSIS — G43809 Other migraine, not intractable, without status migrainosus: Secondary | ICD-10-CM

## 2015-08-24 DIAGNOSIS — Z880 Allergy status to penicillin: Secondary | ICD-10-CM | POA: Diagnosis not present

## 2015-08-24 DIAGNOSIS — Z8639 Personal history of other endocrine, nutritional and metabolic disease: Secondary | ICD-10-CM | POA: Insufficient documentation

## 2015-08-24 DIAGNOSIS — R200 Anesthesia of skin: Secondary | ICD-10-CM

## 2015-08-24 DIAGNOSIS — Z79899 Other long term (current) drug therapy: Secondary | ICD-10-CM | POA: Diagnosis not present

## 2015-08-24 LAB — I-STAT CHEM 8, ED
BUN: 18 mg/dL (ref 6–20)
Calcium, Ion: 1.15 mmol/L (ref 1.12–1.23)
Chloride: 105 mmol/L (ref 101–111)
Creatinine, Ser: 0.8 mg/dL (ref 0.61–1.24)
Glucose, Bld: 131 mg/dL — ABNORMAL HIGH (ref 65–99)
HEMATOCRIT: 43 % (ref 39.0–52.0)
Hemoglobin: 14.6 g/dL (ref 13.0–17.0)
Potassium: 3.7 mmol/L (ref 3.5–5.1)
SODIUM: 141 mmol/L (ref 135–145)
TCO2: 23 mmol/L (ref 0–100)

## 2015-08-24 LAB — URINALYSIS, ROUTINE W REFLEX MICROSCOPIC
Bilirubin Urine: NEGATIVE
Hgb urine dipstick: NEGATIVE
KETONES UR: NEGATIVE mg/dL
Leukocytes, UA: NEGATIVE
Nitrite: NEGATIVE
PH: 5 (ref 5.0–8.0)
Protein, ur: NEGATIVE mg/dL
Specific Gravity, Urine: 1.029 (ref 1.005–1.030)
Urobilinogen, UA: 0.2 mg/dL (ref 0.0–1.0)

## 2015-08-24 LAB — URINE MICROSCOPIC-ADD ON

## 2015-08-24 LAB — COMPREHENSIVE METABOLIC PANEL
ALT: 18 U/L (ref 17–63)
AST: 19 U/L (ref 15–41)
Albumin: 3.9 g/dL (ref 3.5–5.0)
Alkaline Phosphatase: 74 U/L (ref 38–126)
Anion gap: 9 (ref 5–15)
BILIRUBIN TOTAL: 0.4 mg/dL (ref 0.3–1.2)
BUN: 16 mg/dL (ref 6–20)
CO2: 23 mmol/L (ref 22–32)
Calcium: 9.3 mg/dL (ref 8.9–10.3)
Chloride: 108 mmol/L (ref 101–111)
Creatinine, Ser: 0.74 mg/dL (ref 0.61–1.24)
GFR calc Af Amer: >60 mL/min (ref >60)
GFR calc non Af Amer: >60 mL/min (ref >60)
Glucose, Bld: 135 mg/dL — ABNORMAL HIGH (ref 65–99)
Potassium: 3.7 mmol/L (ref 3.5–5.1)
Sodium: 140 mmol/L (ref 135–145)
TOTAL PROTEIN: 6.2 g/dL — AB (ref 6.5–8.1)

## 2015-08-24 LAB — DIFFERENTIAL
BASOS ABS: 0 10*3/uL (ref 0.0–0.1)
Basophils Relative: 0 % (ref 0–1)
Eosinophils Absolute: 0.3 10*3/uL (ref 0.0–0.7)
Eosinophils Relative: 3 % (ref 0–5)
LYMPHS ABS: 1.7 10*3/uL (ref 0.7–4.0)
LYMPHS PCT: 17 % (ref 12–46)
Monocytes Absolute: 0.4 10*3/uL (ref 0.1–1.0)
Monocytes Relative: 4 % (ref 3–12)
NEUTROS PCT: 76 % (ref 43–77)
Neutro Abs: 7.6 10*3/uL (ref 1.7–7.7)

## 2015-08-24 LAB — CBC
HCT: 42.8 % (ref 39.0–52.0)
HEMOGLOBIN: 13.7 g/dL (ref 13.0–17.0)
MCH: 28.1 pg (ref 26.0–34.0)
MCHC: 32 g/dL (ref 30.0–36.0)
MCV: 87.7 fL (ref 78.0–100.0)
Platelets: 233 10*3/uL (ref 150–400)
RBC: 4.88 MIL/uL (ref 4.22–5.81)
RDW: 14.4 % (ref 11.5–15.5)
WBC: 10 10*3/uL (ref 4.0–10.5)

## 2015-08-24 LAB — PROTIME-INR
INR: 1 (ref 0.00–1.49)
Prothrombin Time: 13.4 seconds (ref 11.6–15.2)

## 2015-08-24 LAB — APTT: APTT: 37 s (ref 24–37)

## 2015-08-24 MED ORDER — DIPHENHYDRAMINE HCL 50 MG/ML IJ SOLN
12.5000 mg | Freq: Once | INTRAMUSCULAR | Status: AC
  Administered 2015-08-24: 12.5 mg via INTRAVENOUS
  Filled 2015-08-24: qty 1

## 2015-08-24 MED ORDER — PROCHLORPERAZINE EDISYLATE 5 MG/ML IJ SOLN
10.0000 mg | Freq: Once | INTRAMUSCULAR | Status: AC
  Administered 2015-08-24: 10 mg via INTRAVENOUS
  Filled 2015-08-24: qty 2

## 2015-08-24 NOTE — ED Notes (Signed)
Pt asked about taking levomir from home.  This RN asked Dr. Tomi Bamberger who stated that was fine.

## 2015-08-24 NOTE — ED Notes (Signed)
Pt arrives via [facility] c/o migraine x3days, new onset of L sided facial numbness beginning tonight at 1915, endorses dizziness, denies LOC, N/V, weakness, SOB.  No facial droop noted at this time, no obvious deficits.

## 2015-08-24 NOTE — ED Notes (Signed)
Patient transported to MRI 

## 2015-08-24 NOTE — ED Provider Notes (Signed)
CSN: [id number]     Arrival date & time 08/24/15  2053 History   First MD Initiated Contact with Patient 08/24/15 2107     Chief Complaint  Patient presents with  . Numbness  . Dizziness    The history is provided by the patient.  Pt has history of migraine headaches.  He has had one for the last three days.  The headaches is mostly in the front.  It is constant and throbbing.  Like something drilling into his head.  He has taken his imitrex and it has helped but not relieved the symptoms.  Today he developed some numbness on the left side of his face.  No facial droop.  No LOC.  No n/v.  No weakness in arms or legs.  No trouble with his speech.  Some tingling in both hands earlier.  Past Medical History  Diagnosis Date  . Transient ischemic attack (TIA)   . Diabetes mellitus   . Bipolar 1 disorder   . Migraine   . Dyslipidemia   . Emphysema   . PONV (postoperative nausea and vomiting)   . Shortness of breath   . Mental disorder     BIPOLAR DISORDER  . Stroke    Past Surgical History  Procedure Laterality Date  . Lumbar disc surgery    . Hemorroidectomy    . Tonsillectomy and adenoidectomy    . Knee arthroscopy     Family History  Problem Relation Age of Onset  . Diabetes type II    . Coronary artery disease    . Bipolar disorder    . Lung cancer Father    Social History  Substance Use Topics  . Smoking status: Former Smoker -- 30 years    Types: Cigarettes  . Smokeless tobacco: Current User  . Alcohol Use: No    Review of Systems  All other systems reviewed and are negative.     Allergies  Bee venom; Penicillins; and Procaine  Home Medications   Prior to Admission medications   Medication Sig Start Date End Date Taking? Authorizing Provider  aspirin 81 MG tablet Take 81 mg by mouth 2 (two) times daily. 04/20/12  Yes Monika Salk, MD  b complex-C-folic acid 1 MG capsule Take 1 capsule by mouth daily.   Yes Historical Provider, MD  glipiZIDE (GLUCOTROL XL) 10  MG 24 hr tablet Take 10 mg by mouth daily with breakfast.   Yes Historical Provider, MD  insulin detemir (LEVEMIR) 100 UNIT/ML injection Inject 30-40 Units into the skin 2 (two) times daily. Take 30 units in am & Take 40 units in pm.   Yes Historical Provider, MD  lamoTRIgine (LAMICTAL) 200 MG tablet Take 200 mg by mouth 2 (two) times daily.   Yes Historical Provider, MD  metFORMIN (GLUCOPHAGE) 1000 MG tablet Take 1,000 mg by mouth 2 (two) times daily with a meal.   Yes Historical Provider, MD  Multiple Vitamins-Minerals (MULTIVITAMIN WITH MINERALS) tablet Take 1 tablet by mouth every morning.    Yes Historical Provider, MD   BP 108/66 mmHg  Pulse 86  Temp(Src) 98.7 F (37.1 C) (Oral)  Resp 18  Ht 5\' 8"  (1.727 m)  Wt 250 lb (113.399 kg)  BMI 38.02 kg/m2  SpO2 93% Physical Exam  Constitutional: He is oriented to person, place, and time. He appears well-developed and well-nourished. No distress.  HENT:  Head: Normocephalic and atraumatic.  Right Ear: External ear normal.  Left Ear: External ear normal.  Mouth/Throat:  Oropharynx is clear and moist.  Eyes: Conjunctivae are normal. Right eye exhibits no discharge. Left eye exhibits no discharge. No scleral icterus.  Neck: Neck supple. No tracheal deviation present.  Cardiovascular: Normal rate, regular rhythm and intact distal pulses.   Pulmonary/Chest: Effort normal and breath sounds normal. No stridor. No respiratory distress. He has no wheezes. He has no rales.  Abdominal: Soft. Bowel sounds are normal. He exhibits no distension. There is no tenderness. There is no rebound and no guarding.  Musculoskeletal: He exhibits no edema or tenderness.  Neurological: He is alert and oriented to person, place, and time. He has normal strength. No cranial nerve deficit (No facial droop, extraocular movements intact, tongue midline ) or sensory deficit. He exhibits normal muscle tone. He displays no seizure activity. Coordination normal.  No pronator  drift bilateral upper extrem, able to hold both legs off bed for 5 seconds, sensation intact in all extremities, no visual field cuts, no left or right sided neglect, normal finger-nose exam bilaterally, no nystagmus noted   Skin: Skin is warm and dry. No rash noted.  Psychiatric: He has a normal mood and affect.  Nursing note and vitals reviewed.   ED Course  Procedures (including critical care time) Labs Review Labs Reviewed  COMPREHENSIVE METABOLIC PANEL - Abnormal; Notable for the following:    Glucose, Bld 135 (*)    Total Protein 6.2 (*)    All other components within normal limits  URINALYSIS, ROUTINE W REFLEX MICROSCOPIC (NOT AT [facility]) - Abnormal; Notable for the following:    Glucose, UA >1000 (*)    All other components within normal limits  I-STAT CHEM 8, ED - Abnormal; Notable for the following:    Glucose, Bld 131 (*)    All other components within normal limits  PROTIME-INR  APTT  CBC  DIFFERENTIAL  URINE MICROSCOPIC-ADD ON    Imaging Review Mr Brain Wo Contrast  08/24/2015   CLINICAL DATA:  Initial valuation for 3 day history of left-sided facial numbness.  EXAM: MRI HEAD WITHOUT CONTRAST  TECHNIQUE: Multiplanar, multiecho pulse sequences of the brain and surrounding structures were obtained without intravenous contrast.  COMPARISON:  Prior MRI from 07/27/2012  FINDINGS: Study is degraded by motion artifact.  Mild scattered patchy T2/FLAIR hyperintense foci within the periventricular and deep white matter both cerebral hemispheres noted, nonspecific, but stable from previous exam.  No evidence for acute infarct. Gray-white matter differentiation maintained. Normal intravascular flow voids are preserved. No acute or chronic intracranial hemorrhage.  No mass lesion, midline shift, or mass effect. No hydrocephalus. No extra-axial fluid collection.  Craniocervical junction within normal limits. Pituitary gland grossly normal.  No acute abnormality about the orbits.  Moderate  right maxillary sinus disease noted. Scattered opacity within the right ethmoidal air cells as well. No mastoid effusion. Inner ear structures grossly normal.  Bone marrow signal intensity within normal limits. Scalp soft tissues unremarkable.  IMPRESSION: 1. No acute intracranial process. 2. Scattered nonspecific white matter changes, stable. These are indeterminate, and may be related to chronic small vessel ischemic disease or underlying migrainous disorder. Other potential but less likely considerations include demyelinating disease, prior inflammatory process, vasculitis, or prior trauma. 3. Moderate right maxillary sinus disease.   Electronically Signed   By: Jeannine Boga M.D.   On: 08/24/2015 23:48   I have personally reviewed and evaluated these images and lab results as part of my medical decision-making.   EKG Interpretation   Date/Time:  Friday August 24 2015  21:00:25 EDT Ventricular Rate:  90 PR Interval:  154 QRS Duration: 101 QT Interval:  381 QTC Calculation: 466 R Axis:   54 Text Interpretation:  Sinus rhythm No significant change since last  tracing Confirmed by Preet Mangano  MD-J, Jerlean Peralta ([id number]) on 08/24/2015 9:03:37 PM      MDM   Final diagnoses:  Numbness  Other migraine without status migrainosus, not intractable   Patient. Describes left-sided facial numbness associated with headache. He has no focal neurologic deficits on exam. The patient's MRI does not show evidence of stroke despite his several days of symptoms. I suspect his symptoms may be related to a complicated migraine. The patient was treated in emergency department with a migraine cocktail.  At this time there does not appear to be any evidence of an acute emergency medical condition and the patient appears stable for discharge with appropriate outpatient follow up.     Dorie Rank, MD 08/25/15 [phone]

## 2015-08-25 LAB — CBG MONITORING, ED: Glucose-Capillary: 141 mg/dL — ABNORMAL HIGH (ref 65–99)

## 2015-08-25 NOTE — Discharge Instructions (Signed)

## 2016-01-02 ENCOUNTER — Encounter (HOSPITAL_COMMUNITY): Payer: Self-pay | Admitting: Emergency Medicine

## 2016-01-02 ENCOUNTER — Emergency Department (HOSPITAL_COMMUNITY)
Admission: EM | Admit: 2016-01-02 | Discharge: 2016-01-03 | Disposition: A | Discharge status: Home / Self Care | Payer: BLUE CROSS/BLUE SHIELD | Attending: Emergency Medicine | Admitting: Emergency Medicine

## 2016-01-02 DIAGNOSIS — Z79899 Other long term (current) drug therapy: Secondary | ICD-10-CM | POA: Insufficient documentation

## 2016-01-02 DIAGNOSIS — Z880 Allergy status to penicillin: Secondary | ICD-10-CM | POA: Insufficient documentation

## 2016-01-02 DIAGNOSIS — Z794 Long term (current) use of insulin: Secondary | ICD-10-CM | POA: Insufficient documentation

## 2016-01-02 DIAGNOSIS — E119 Type 2 diabetes mellitus without complications: Secondary | ICD-10-CM | POA: Insufficient documentation

## 2016-01-02 DIAGNOSIS — Z7984 Long term (current) use of oral hypoglycemic drugs: Secondary | ICD-10-CM | POA: Insufficient documentation

## 2016-01-02 DIAGNOSIS — Z8659 Personal history of other mental and behavioral disorders: Secondary | ICD-10-CM | POA: Diagnosis not present

## 2016-01-02 DIAGNOSIS — Z8679 Personal history of other diseases of the circulatory system: Secondary | ICD-10-CM | POA: Diagnosis not present

## 2016-01-02 DIAGNOSIS — H7292 Unspecified perforation of tympanic membrane, left ear: Secondary | ICD-10-CM | POA: Diagnosis not present

## 2016-01-02 DIAGNOSIS — Z8673 Personal history of transient ischemic attack (TIA), and cerebral infarction without residual deficits: Secondary | ICD-10-CM | POA: Diagnosis not present

## 2016-01-02 DIAGNOSIS — H9202 Otalgia, left ear: Secondary | ICD-10-CM | POA: Diagnosis present

## 2016-01-02 DIAGNOSIS — J439 Emphysema, unspecified: Secondary | ICD-10-CM | POA: Diagnosis not present

## 2016-01-02 DIAGNOSIS — Z7982 Long term (current) use of aspirin: Secondary | ICD-10-CM | POA: Diagnosis not present

## 2016-01-02 DIAGNOSIS — Z87891 Personal history of nicotine dependence: Secondary | ICD-10-CM | POA: Diagnosis not present

## 2016-01-02 NOTE — ED Notes (Signed)
Patient presents for left ear bleeding starting approximately 2230, patient c/o bright blood from left ear with clots, denies injury to same, c/o decreased hearing in left ear. Possible scratch to inside of left ear, no active bleeding noted at this time. Rates pain 3/10.

## 2016-01-03 MED ORDER — PREDNISONE 10 MG PO TABS: ORAL_TABLET | ORAL | Status: DC

## 2016-01-03 MED ORDER — CIPROFLOXACIN-HYDROCORTISONE 0.2-1 % OT SUSP: 3.0000 [drp] | Freq: Two times a day (BID) | OTIC | Status: DC

## 2016-01-03 NOTE — Discharge Instructions (Signed)
Eardrum Perforation The eardrum is a thin, round tissue inside the ear. It allows you to hear. The eardrum can get torn (perforated). Eardrums often heal on their own. There is often little or no long-term hearing loss. HOME CARE  Keep your ear dry while it heals. Do not let your head go under water. Do not swim or dive until your doctor says it is okay.  Before you take a bath or shower, do one of these things to keep water out of your ear:  Put a waterproof earplug in your ear.  Put petroleum jelly all over a cotton ball. Put the cotton ball in your ear.  Take medicines only as told by your doctor.  Avoid blowing your nose if you can. If you blow your nose, do it gently.  Continue your normal activities after your eardrum heals. Your doctor will tell you when your eardrum has healed.  Talk to your doctor before you fly on an airplane.  Keep all doctor follow-up visits as told by your doctor. This is important. GET HELP IF:  You have a fever. GET HELP RIGHT AWAY IF:  You have blood or yellowish-white fluid (pus) coming from your ear.  You feel dizzy or off balance.  You feel sick to your stomach (nauseous), or you throw up (vomit).  You have more pain.   This information is not intended to replace advice given to you by your health care provider. Make sure you discuss any questions you have with your health care provider.   Document Released: 05/21/2010 Document Revised: 12/22/2014 Document Reviewed: 07/10/2014 Elsevier Interactive Patient Education [employer].

## 2016-01-03 NOTE — ED Provider Notes (Signed)
CSN: [id number]     Arrival date & time 01/02/16  2339 History   First MD Initiated Contact with Patient 01/03/16 0004     No chief complaint on file.    (Consider location/radiation/quality/duration/timing/severity/associated sxs/prior Treatment) HPI Comments: Patient presents with sudden onset painless left ear bleeding that started after using his finger only to relieve itching he felt in the ear canal. He has had a "sinus infection from hell" recently as he reports having every year and has felt increased sinus pressure and congestion. No fever. No hearing impairment since the bleeding began. No history of TM perforation in the past.   The history is provided by the patient. No language interpreter was used.    Past Medical History  Diagnosis Date  . Transient ischemic attack (TIA)   . Diabetes mellitus   . Bipolar 1 disorder ([place])   . Migraine   . Dyslipidemia   . Emphysema   . PONV (postoperative nausea and vomiting)   . Shortness of breath   . Mental disorder     BIPOLAR DISORDER  . Stroke [facility])    Past Surgical History  Procedure Laterality Date  . Lumbar disc surgery    . Hemorroidectomy    . Tonsillectomy and adenoidectomy    . Knee arthroscopy     Family History  Problem Relation Age of Onset  . Diabetes type II    . Coronary artery disease    . Bipolar disorder    . Lung cancer Father    Social History  Substance Use Topics  . Smoking status: Former Smoker -- 30 years    Types: Cigarettes  . Smokeless tobacco: Current User  . Alcohol Use: No    Review of Systems  Constitutional: Negative for fever.  HENT: Positive for sinus pressure. Negative for hearing loss and sore throat.        See HPI.  Gastrointestinal: Negative for nausea.  Skin: Negative for wound.  Neurological: Negative for headaches.      Allergies  Bee venom; Penicillins; and Procaine  Home Medications   Prior to Admission medications   Medication Sig Start Date End Date  Taking? Authorizing Provider  aspirin 81 MG tablet Take 81 mg by mouth 2 (two) times daily. 04/20/12   Monika Salk, MD  b complex-C-folic acid 1 MG capsule Take 1 capsule by mouth daily.    Historical Provider, MD  ciprofloxacin-hydrocortisone (CIPRO HC) otic suspension Place 3 drops into the left ear 2 (two) times daily. 01/03/16   Charlann Lange, PA-C  glipiZIDE (GLUCOTROL XL) 10 MG 24 hr tablet Take 10 mg by mouth daily with breakfast.    Historical Provider, MD  insulin detemir (LEVEMIR) 100 UNIT/ML injection Inject 30-40 Units into the skin 2 (two) times daily. Take 30 units in am & Take 40 units in pm.    Historical Provider, MD  lamoTRIgine (LAMICTAL) 200 MG tablet Take 200 mg by mouth 2 (two) times daily.    Historical Provider, MD  metFORMIN (GLUCOPHAGE) 1000 MG tablet Take 1,000 mg by mouth 2 (two) times daily with a meal.    Historical Provider, MD  Multiple Vitamins-Minerals (MULTIVITAMIN WITH MINERALS) tablet Take 1 tablet by mouth every morning.     Historical Provider, MD  predniSONE (DELTASONE) 10 MG tablet Take 6 on day 1 Take 5 on day 2 Take 4 on day 3 Take 3 on day 4 Take 2 on day 5 Take 1 on day 6 01/03/16   Nehemiah Settle  Jurnei Latini, PA-C   BP 162/90 mmHg  Pulse 108  Temp(Src) 98.6 F (37 C) (Oral)  Resp 20  SpO2 98% Physical Exam  Constitutional: He is oriented to person, place, and time. He appears well-developed and well-nourished. No distress.  HENT:  Head: Normocephalic.  Right Ear: External ear normal.  Left Ear: External ear normal. No foreign bodies. Tympanic membrane is perforated.  Eyes: Conjunctivae are normal.  Neck: Normal range of motion. Neck supple.  Pulmonary/Chest: Effort normal.  Neurological: He is alert and oriented to person, place, and time.  Skin: Skin is warm and dry.    ED Course  Procedures (including critical care time) Labs Review Labs Reviewed - No data to display  Imaging Review No results found. I have personally reviewed and evaluated  these images and lab results as part of my medical decision-making.   EKG Interpretation None      MDM   Final diagnoses:  Perforated ear drum, left   Small perforation noted of left TM. The patient is started on Cipro Otic suspension and referred to ENT, Dr. Erik Obey for recheck.   He is requesting steroid taper which has been the treatment of choice in the past with sinus infections. Six day taper provided.     Charlann Lange, PA-C 01/03/16 [place], MD 01/03/16 2024

## 2016-04-10 ENCOUNTER — Emergency Department (HOSPITAL_COMMUNITY)
Admission: EM | Admit: 2016-04-10 | Discharge: 2016-04-10 | Disposition: A | Discharge status: Home / Self Care | Payer: BLUE CROSS/BLUE SHIELD | Attending: Emergency Medicine | Admitting: Emergency Medicine

## 2016-04-10 ENCOUNTER — Emergency Department (HOSPITAL_COMMUNITY): Payer: BLUE CROSS/BLUE SHIELD

## 2016-04-10 ENCOUNTER — Encounter (HOSPITAL_COMMUNITY): Payer: Self-pay | Admitting: Emergency Medicine

## 2016-04-10 DIAGNOSIS — Z792 Long term (current) use of antibiotics: Secondary | ICD-10-CM | POA: Diagnosis not present

## 2016-04-10 DIAGNOSIS — Z79891 Long term (current) use of opiate analgesic: Secondary | ICD-10-CM | POA: Diagnosis not present

## 2016-04-10 DIAGNOSIS — Z87891 Personal history of nicotine dependence: Secondary | ICD-10-CM | POA: Diagnosis not present

## 2016-04-10 DIAGNOSIS — F319 Bipolar disorder, unspecified: Secondary | ICD-10-CM | POA: Insufficient documentation

## 2016-04-10 DIAGNOSIS — E119 Type 2 diabetes mellitus without complications: Secondary | ICD-10-CM | POA: Insufficient documentation

## 2016-04-10 DIAGNOSIS — E785 Hyperlipidemia, unspecified: Secondary | ICD-10-CM | POA: Diagnosis not present

## 2016-04-10 DIAGNOSIS — Z8673 Personal history of transient ischemic attack (TIA), and cerebral infarction without residual deficits: Secondary | ICD-10-CM | POA: Insufficient documentation

## 2016-04-10 DIAGNOSIS — Z7984 Long term (current) use of oral hypoglycemic drugs: Secondary | ICD-10-CM | POA: Diagnosis not present

## 2016-04-10 DIAGNOSIS — Z79899 Other long term (current) drug therapy: Secondary | ICD-10-CM | POA: Diagnosis not present

## 2016-04-10 DIAGNOSIS — M5442 Lumbago with sciatica, left side: Secondary | ICD-10-CM | POA: Insufficient documentation

## 2016-04-10 DIAGNOSIS — Z794 Long term (current) use of insulin: Secondary | ICD-10-CM | POA: Diagnosis not present

## 2016-04-10 DIAGNOSIS — Z7982 Long term (current) use of aspirin: Secondary | ICD-10-CM | POA: Diagnosis not present

## 2016-04-10 DIAGNOSIS — M25552 Pain in left hip: Secondary | ICD-10-CM | POA: Insufficient documentation

## 2016-04-10 DIAGNOSIS — M549 Dorsalgia, unspecified: Secondary | ICD-10-CM

## 2016-04-10 DIAGNOSIS — Z7952 Long term (current) use of systemic steroids: Secondary | ICD-10-CM | POA: Insufficient documentation

## 2016-04-10 DIAGNOSIS — Z791 Long term (current) use of non-steroidal anti-inflammatories (NSAID): Secondary | ICD-10-CM | POA: Insufficient documentation

## 2016-04-10 MED ORDER — METHOCARBAMOL 500 MG PO TABS: 500.0000 mg | ORAL_TABLET | Freq: Two times a day (BID) | ORAL | Status: DC

## 2016-04-10 MED ORDER — IBUPROFEN 800 MG PO TABS: 800.0000 mg | ORAL_TABLET | Freq: Three times a day (TID) | ORAL | Status: DC

## 2016-04-10 MED ORDER — METHOCARBAMOL 500 MG PO TABS
500.0000 mg | ORAL_TABLET | Freq: Once | ORAL | Status: AC
  Administered 2016-04-10: 500 mg via ORAL
  Filled 2016-04-10: qty 1

## 2016-04-10 MED ORDER — HYDROCODONE-ACETAMINOPHEN 5-325 MG PO TABS: 1.0000 | ORAL_TABLET | ORAL | Status: DC | PRN

## 2016-04-10 MED ORDER — KETOROLAC TROMETHAMINE 30 MG/ML IJ SOLN
30.0000 mg | Freq: Once | INTRAMUSCULAR | Status: AC
  Administered 2016-04-10: 30 mg via INTRAVENOUS
  Filled 2016-04-10 (×2): qty 1

## 2016-04-10 NOTE — ED Notes (Addendum)
Pt reports pain radiating from L lower back down to hip and down leg. Hx of bulging discs. Pain has gradually gotten worse over the past week especially for the last day. Pt ambulatory to triage. No known injury. Pain worsened by standing and bending forward

## 2016-04-10 NOTE — Discharge Instructions (Signed)
1. Medications: ibuprofen, robaxin, vicodin for severe pain, usual home medications 2. Treatment: rest, drink plenty of fluids 3. Follow Up: please followup with your primary doctor and with the spine specialist for discussion of your diagnoses and further evaluation after today's visit; if you do not have a primary care doctor use the phone number listed in your discharge paperwork to find one; please return to the ER for increased pain, numbness, weakness, loss of control of your bowel or bladder, new or worsening symptoms   Back Pain, Adult Back pain is very common. The pain often gets better over time. The cause of back pain is usually not dangerous. Most people can learn to manage their back pain on their own.  HOME CARE  Watch your back pain for any changes. The following actions may help to lessen any pain you are feeling:  Stay active. Start with short walks on flat ground if you can. Try to walk farther each day.  Exercise regularly as told by your doctor. Exercise helps your back heal faster. It also helps avoid future injury by keeping your muscles strong and flexible.  Do not sit, drive, or stand in one place for more than 30 minutes.  Do not stay in bed. Resting more than 1-2 days can slow down your recovery.  Be careful when you bend or lift an object. Use good form when lifting:  Bend at your knees.  Keep the object close to your body.  Do not twist.  Sleep on a firm mattress. Lie on your side, and bend your knees. If you lie on your back, put a pillow under your knees.  Take medicines only as told by your doctor.  Put ice on the injured area.  Put ice in a plastic bag.  Place a towel between your skin and the bag.  Leave the ice on for 20 minutes, 2-3 times a day for the first 2-3 days. After that, you can switch between ice and heat packs.  Avoid feeling anxious or stressed. Find good ways to deal with stress, such as exercise.  Maintain a healthy weight.  Extra weight puts stress on your back. GET HELP IF:   You have pain that does not go away with rest or medicine.  You have worsening pain that goes down into your legs or buttocks.  You have pain that does not get better in one week.  You have pain at night.  You lose weight.  You have a fever or chills. GET HELP RIGHT AWAY IF:   You cannot control when you poop (bowel movement) or pee (urinate).  Your arms or legs feel weak.  Your arms or legs lose feeling (numbness).  You feel sick to your stomach (nauseous) or throw up (vomit).  You have belly (abdominal) pain.  You feel like you may pass out (faint).   This information is not intended to replace advice given to you by your health care provider. Make sure you discuss any questions you have with your health care provider.   Document Released: 05/19/2008 Document Revised: 12/22/2014 Document Reviewed: 04/04/2014 Elsevier Interactive Patient Education [employer].

## 2016-04-10 NOTE — ED Provider Notes (Signed)
CSN: [id number]     Arrival date & time 04/10/16  1501 History   By signing my name below, I, Jerry Shaffer, attest that this documentation has been prepared under the direction and in the presence of non-physician practitioner, Bernerd Limbo, PA-C. Electronically Signed: Evelene Shaffer, Scribe. 04/10/2016. 3:24 PM.  Chief Complaint  Patient presents with  . Sciatica    The history is provided by the patient. No language interpreter was used.     HPI Comments:  Jerry Shaffer is a 55 y.o. male who presents to the Emergency Department complaining of moderate lower back pain that began ~ 1 week ago but worsened ~ 2 days ago. Pt notes his pain shoots down to left hip and down to left knee. Pt denies obvious injury but states he feels like he may have strained his back while cleaning a shelf. He notes his pain is better when laying down and exacerbated with movement. He denies abdominal pain, vomiting, numbness, weakness, paresthesia, bowel or bladder incontinence, saddle anesthesia, history of malignancy, IVDU, anticoagulant use. He has taken lortab and soma with little relief.    Past Medical History  Diagnosis Date  . Transient ischemic attack (TIA)   . Diabetes mellitus   . Bipolar 1 disorder ([place])   . Migraine   . Dyslipidemia   . Emphysema   . PONV (postoperative nausea and vomiting)   . Shortness of breath   . Mental disorder     BIPOLAR DISORDER  . Stroke [facility])    Past Surgical History  Procedure Laterality Date  . Lumbar disc surgery    . Hemorroidectomy    . Tonsillectomy and adenoidectomy    . Knee arthroscopy     Family History  Problem Relation Age of Onset  . Diabetes type II    . Coronary artery disease    . Bipolar disorder    . Lung cancer Father    Social History  Substance Use Topics  . Smoking status: Former Smoker -- 30 years    Types: Cigarettes  . Smokeless tobacco: Current User  . Alcohol Use: No     Review of Systems  Gastrointestinal:  Negative for vomiting and abdominal pain.  Musculoskeletal: Positive for myalgias and back pain.  Neurological: Negative for weakness and numbness.    Allergies  Bee venom; Penicillins; and Procaine  Home Medications   Prior to Admission medications   Medication Sig Start Date End Date Taking? Authorizing Provider  aspirin 81 MG tablet Take 81 mg by mouth 2 (two) times daily. 04/20/12   Monika Salk, MD  b complex-C-folic acid 1 MG capsule Take 1 capsule by mouth daily.    Historical Provider, MD  ciprofloxacin-hydrocortisone (CIPRO HC) otic suspension Place 3 drops into the left ear 2 (two) times daily. 01/03/16   Charlann Lange, PA-C  glipiZIDE (GLUCOTROL XL) 10 MG 24 hr tablet Take 10 mg by mouth daily with breakfast.    Historical Provider, MD  HYDROcodone-acetaminophen (NORCO/VICODIN) 5-325 MG tablet Take 1 tablet by mouth every 4 (four) hours as needed. 04/10/16   Marella Chimes, PA-C  ibuprofen (ADVIL,MOTRIN) 800 MG tablet Take 1 tablet (800 mg total) by mouth 3 (three) times daily. 04/10/16   Marella Chimes, PA-C  insulin detemir (LEVEMIR) 100 UNIT/ML injection Inject 30-40 Units into the skin 2 (two) times daily. Take 30 units in am & Take 40 units in pm.    Historical Provider, MD  lamoTRIgine (LAMICTAL) 200 MG tablet Take  200 mg by mouth 2 (two) times daily.    Historical Provider, MD  metFORMIN (GLUCOPHAGE) 1000 MG tablet Take 1,000 mg by mouth 2 (two) times daily with a meal.    Historical Provider, MD  methocarbamol (ROBAXIN) 500 MG tablet Take 1 tablet (500 mg total) by mouth 2 (two) times daily. 04/10/16   Marella Chimes, PA-C  Multiple Vitamins-Minerals (MULTIVITAMIN WITH MINERALS) tablet Take 1 tablet by mouth every morning.     Historical Provider, MD  predniSONE (DELTASONE) 10 MG tablet Take 6 on day 1 Take 5 on day 2 Take 4 on day 3 Take 3 on day 4 Take 2 on day 5 Take 1 on day 6 01/03/16   Shari Upstill, PA-C    BP 123/95 mmHg  Pulse 93  Temp(Src) 98.2  F (36.8 C) (Oral)  Resp 16  SpO2 98% Physical Exam  Constitutional: He is oriented to person, place, and time. He appears well-developed and well-nourished. No distress.  Patient appears uncomfortable due to pain.  HENT:  Head: Normocephalic and atraumatic.  Right Ear: External ear normal.  Left Ear: External ear normal.  Nose: Nose normal.  Mouth/Throat: Uvula is midline, oropharynx is clear and moist and mucous membranes are normal.  Eyes: Conjunctivae, EOM and lids are normal. Pupils are equal, round, and reactive to light. Right eye exhibits no discharge. Left eye exhibits no discharge. No scleral icterus.  Neck: Normal range of motion. Neck supple.  Cardiovascular: Normal rate, regular rhythm, normal heart sounds, intact distal pulses and normal pulses.   Pulmonary/Chest: Effort normal and breath sounds normal. No respiratory distress. He has no wheezes. He has no rales.  Abdominal: Soft. Normal appearance and bowel sounds are normal. He exhibits no distension and no mass. There is no tenderness. There is no rigidity, no rebound and no guarding.  Musculoskeletal: Normal range of motion. He exhibits tenderness. He exhibits no edema.  TTP to posterior aspect of left hip. No TTP to lumbar spine or paraspinal muscles. No palpable step-off or deformity.  Neurological: He is alert and oriented to person, place, and time. He has normal strength. No sensory deficit.  Skin: Skin is warm, dry and intact. No rash noted. He is not diaphoretic. No erythema. No pallor.  Psychiatric: He has a normal mood and affect. His speech is normal and behavior is normal.  Nursing note and vitals reviewed.   ED Course  Procedures   DIAGNOSTIC STUDIES:  Oxygen Saturation is 98% on RA, normal by my interpretation.    COORDINATION OF CARE:  3:22 PM Discussed treatment plan with pt at bedside and pt agreed to plan.  Labs Review Labs Reviewed - No data to display  Imaging Review Dg Hip Unilat With  Pelvis 2-3 Views Left  04/10/2016  CLINICAL DATA:  Pain.  No reported injury. EXAM: DG HIP (WITH OR WITHOUT PELVIS) 2-3V LEFT COMPARISON:  No recent. FINDINGS: Degenerative changes lumbar spine and both hips. No acute bony abnormality identified. No evidence of fracture or dislocation. IMPRESSION: No acute abnormality. Electronically Signed   By: Marcello Moores  Register   On: 04/10/2016 16:14   I have personally reviewed and evaluated these images and lab results as part of my medical decision-making.  MDM   Final diagnoses:  Back pain, unspecified location  Left hip pain    55 year old male presents with lower back pain radiating to his hip and leg. States his symptoms have been present for the past week though worsened 2 days ago. Notes  he thinks he strained a muscle in his back when he was cleaning a shelf. Denies numbness, weakness, paresthesia, bowel or bladder incontinence, saddle anesthesia, history of malignancy, IV drug use, anticoagulant use. Patient is afebrile. Vital signs stable. On exam, he has TTP to the posterior aspect of his left hip. No TTP to lumbar spine or paraspinal muscles. No palpable step-off or deformity. Strength, sensation, DTRs intact. Patient moves all extremities without difficulty. Imaging of left hip negative for acute abnormality, remarkable for degenerative changes. Patient is nontoxic and well-appearing, feel he is stable for discharge at this time. Will discharge with anti-inflammatory, muscle relaxant, and short course of pain medication. Patient to follow-up with PCP and with spine specialist for persistent symptoms. Strict return precautions discussed. Patient verbalizes his understanding and is in agreement with plan.  BP 123/95 mmHg  Pulse 93  Temp(Src) 98.2 F (36.8 C) (Oral)  Resp 16  SpO2 98%  I personally performed the services described in this documentation, which was scribed in my presence. The recorded information has been reviewed and is  accurate.    Marella Chimes, [place] 04/10/16 1623

## 2016-06-25 NOTE — ED Provider Notes (Signed)
Medical screening examination/treatment/procedure(s) were performed by non-physician practitioner and as supervising physician I was immediately available for consultation/collaboration.   EKG Interpretation None       Isla Pence, MD 06/25/16 1355

## 2017-01-22 ENCOUNTER — Emergency Department (HOSPITAL_COMMUNITY): Payer: BLUE CROSS/BLUE SHIELD

## 2017-01-22 ENCOUNTER — Emergency Department (HOSPITAL_COMMUNITY)
Admission: EM | Admit: 2017-01-22 | Discharge: 2017-01-22 | Disposition: A | Discharge status: Home / Self Care | Payer: BLUE CROSS/BLUE SHIELD | Attending: Emergency Medicine | Admitting: Emergency Medicine

## 2017-01-22 ENCOUNTER — Encounter (HOSPITAL_COMMUNITY): Payer: Self-pay

## 2017-01-22 DIAGNOSIS — R079 Chest pain, unspecified: Secondary | ICD-10-CM

## 2017-01-22 DIAGNOSIS — Z8673 Personal history of transient ischemic attack (TIA), and cerebral infarction without residual deficits: Secondary | ICD-10-CM | POA: Diagnosis not present

## 2017-01-22 DIAGNOSIS — R091 Pleurisy: Secondary | ICD-10-CM

## 2017-01-22 DIAGNOSIS — Z794 Long term (current) use of insulin: Secondary | ICD-10-CM | POA: Insufficient documentation

## 2017-01-22 DIAGNOSIS — Z7982 Long term (current) use of aspirin: Secondary | ICD-10-CM | POA: Diagnosis not present

## 2017-01-22 DIAGNOSIS — Z87891 Personal history of nicotine dependence: Secondary | ICD-10-CM | POA: Diagnosis not present

## 2017-01-22 DIAGNOSIS — I10 Essential (primary) hypertension: Secondary | ICD-10-CM | POA: Diagnosis not present

## 2017-01-22 DIAGNOSIS — E119 Type 2 diabetes mellitus without complications: Secondary | ICD-10-CM | POA: Diagnosis not present

## 2017-01-22 LAB — I-STAT TROPONIN, ED
TROPONIN I, POC: 0 ng/mL (ref 0.00–0.08)
Troponin i, poc: 0 ng/mL (ref 0.00–0.08)

## 2017-01-22 LAB — CBC
HCT: 39.8 % (ref 39.0–52.0)
Hemoglobin: 12.9 g/dL — ABNORMAL LOW (ref 13.0–17.0)
MCH: 27.7 pg (ref 26.0–34.0)
MCHC: 32.4 g/dL (ref 30.0–36.0)
MCV: 85.6 fL (ref 78.0–100.0)
PLATELETS: 268 10*3/uL (ref 150–400)
RBC: 4.65 MIL/uL (ref 4.22–5.81)
RDW: 13.8 % (ref 11.5–15.5)
WBC: 9.1 10*3/uL (ref 4.0–10.5)

## 2017-01-22 LAB — BASIC METABOLIC PANEL
ANION GAP: 10 (ref 5–15)
BUN: 25 mg/dL — ABNORMAL HIGH (ref 6–20)
CHLORIDE: 101 mmol/L (ref 101–111)
CO2: 25 mmol/L (ref 22–32)
CREATININE: 0.94 mg/dL (ref 0.61–1.24)
Calcium: 9.1 mg/dL (ref 8.9–10.3)
GFR calc non Af Amer: >60 mL/min (ref >60)
Glucose, Bld: 202 mg/dL — ABNORMAL HIGH (ref 65–99)
POTASSIUM: 3.7 mmol/L (ref 3.5–5.1)
SODIUM: 136 mmol/L (ref 135–145)

## 2017-01-22 MED ORDER — NAPROXEN 500 MG PO TABS: 500.0000 mg | ORAL_TABLET | Freq: Two times a day (BID) | ORAL | 0 refills | Status: DC

## 2017-01-22 MED ORDER — KETOROLAC TROMETHAMINE 30 MG/ML IJ SOLN
30.0000 mg | Freq: Once | INTRAMUSCULAR | Status: AC
  Administered 2017-01-22: 30 mg via INTRAVENOUS
  Filled 2017-01-22: qty 1

## 2017-01-22 NOTE — ED Provider Notes (Signed)
[facility] DEPT Provider Note   CSN: [id number] Arrival date & time: 01/22/17  0715     History   Chief Complaint Chief Complaint  Patient presents with  . Chest Pain    HPI Jerry Shaffer is a 56 y.o. male. He presents for evaluation of left-sided chest pain.  HPI:  Patient with no documented history of heart disease. Has history of diabetes, and family history of heart disease. Current nonsmoker. He works at [employer] He works at [employer] position. He worked all night. Approximately 2:58 AM he started feeling some left-sided chest pain. He was intermittent. It is sharp. It seems to be related to cough or breathing. She does not feel short of breath but it was worse with exertion or movement. He states he became concerned because his mother died of heart attack when she was in her 37s. Also states that his father developed lung cancer his left lung.  Pain does come and go "in waves". At rest it is better. No GI complaints with nausea or reflux symptoms. Occasional cough and some nasal congestion. No fever or shortness of breath. No leg swelling. No history DVT PE.  Past Medical History:  Diagnosis Date  . Bipolar 1 disorder ([place])   . Diabetes mellitus   . Dyslipidemia   . Emphysema   . Mental disorder    BIPOLAR DISORDER  . Migraine   . PONV (postoperative nausea and vomiting)   . Shortness of breath   . Stroke ([place])   . Transient ischemic attack (TIA)     Patient Active Problem List   Diagnosis Date Noted  . Cellulitis of hand, left 05/03/2013  . Diverticulitis 07/26/2012  . Left facial numbness 07/26/2012  . Headache(784.0) 07/26/2012  . DM2 (diabetes mellitus, type 2) ([place]) 07/26/2012  . TIA (transient ischemic attack) 04/19/2012  . ATTENTION DEFICIT HYPERACTIVITY DISORDER, ADULT 07/25/2010  . GERD 07/25/2010  . DIVERTICULOSIS OF COLON 07/25/2010  . ABDOMINAL PAIN, LEFT LOWER QUADRANT 07/25/2010  . DIABETES MELLITUS, CONTROLLED 12/21/2009  . MIGRAINE  HEADACHE 12/21/2009  . MICROSCOPIC HEMATURIA 12/21/2009  . KNEE PAIN, LEFT 12/21/2009  . CHEST PAIN, INTERMITTENT 12/21/2009  . HYPERTENSION NEC 12/21/2009    Past Surgical History:  Procedure Laterality Date  . HEMORROIDECTOMY    . KNEE ARTHROSCOPY    . LUMBAR DISC SURGERY    . TONSILLECTOMY AND ADENOIDECTOMY         Home Medications    Prior to Admission medications   Medication Sig Start Date End Date Taking? Authorizing Provider  albuterol (PROVENTIL HFA;VENTOLIN HFA) 108 (90 Base) MCG/ACT inhaler Inhale 1-2 puffs into the lungs every 6 (six) hours as needed for wheezing or shortness of breath.   Yes Historical Provider, MD  aspirin 81 MG tablet Take 81 mg by mouth 2 (two) times daily. 04/20/12  Yes Monika Salk, MD  b complex-C-folic acid 1 MG capsule Take 1 capsule by mouth daily.   Yes Historical Provider, MD  glipiZIDE (GLUCOTROL XL) 10 MG 24 hr tablet Take 10 mg by mouth daily with breakfast.   Yes Historical Provider, MD  Insulin Detemir (LEVEMIR FLEXTOUCH) 100 UNIT/ML Pen Inject 50 Units into the skin 2 (two) times daily.   Yes Historical Provider, MD  lamoTRIgine (LAMICTAL) 100 MG tablet Take 100 mg by mouth 2 (two) times daily.   Yes Historical Provider, MD  metFORMIN (GLUCOPHAGE) 1000 MG tablet Take 1,000 mg by mouth 2 (two) times daily with a meal.   Yes  Historical Provider, MD  ciprofloxacin-hydrocortisone (CIPRO HC) otic suspension Place 3 drops into the left ear 2 (two) times daily. Patient not taking: Reported on 01/22/2017 01/03/16   Charlann Lange, PA-C  HYDROcodone-acetaminophen (NORCO/VICODIN) 5-325 MG tablet Take 1 tablet by mouth every 4 (four) hours as needed. Patient not taking: Reported on 01/22/2017 04/10/16   Marella Chimes, PA-C  ibuprofen (ADVIL,MOTRIN) 800 MG tablet Take 1 tablet (800 mg total) by mouth 3 (three) times daily. Patient not taking: Reported on 01/22/2017 04/10/16   Marella Chimes, PA-C  methocarbamol (ROBAXIN) 500 MG tablet Take 1  tablet (500 mg total) by mouth 2 (two) times daily. Patient not taking: Reported on 01/22/2017 04/10/16   Marella Chimes, PA-C  naproxen (NAPROSYN) 500 MG tablet Take 1 tablet (500 mg total) by mouth 2 (two) times daily. 01/22/17   Tanna Furry, MD  predniSONE (DELTASONE) 10 MG tablet Take 6 on day 1 Take 5 on day 2 Take 4 on day 3 Take 3 on day 4 Take 2 on day 5 Take 1 on day 6 Patient not taking: Reported on 01/22/2017 01/03/16   Charlann Lange, PA-C    Family History Family History  Problem Relation Age of Onset  . Diabetes type II    . Coronary artery disease    . Bipolar disorder    . Lung cancer Father     Social History Social History  Substance Use Topics  . Smoking status: Former Smoker    Years: 30.00    Types: Cigarettes  . Smokeless tobacco: Current User  . Alcohol use No     Allergies   Bee venom; Penicillins; and Procaine   Review of Systems Review of Systems  Constitutional: Negative for appetite change, chills, diaphoresis, fatigue and fever.  HENT: Negative for mouth sores, sore throat and trouble swallowing.   Eyes: Negative for visual disturbance.  Respiratory: Positive for cough. Negative for chest tightness, shortness of breath and wheezing.   Cardiovascular: Positive for chest pain.  Gastrointestinal: Negative for abdominal distention, abdominal pain, diarrhea, nausea and vomiting.  Endocrine: Negative for polydipsia, polyphagia and polyuria.  Genitourinary: Negative for dysuria, frequency and hematuria.  Musculoskeletal: Negative for gait problem.  Skin: Negative for color change, pallor and rash.  Neurological: Negative for dizziness, syncope, light-headedness and headaches.  Hematological: Does not bruise/bleed easily.  Psychiatric/Behavioral: Negative for behavioral problems and confusion.     Physical Exam Updated Vital Signs BP 132/89 (BP Location: Right Arm)   Pulse 76   Temp 98 F (36.7 C) (Oral)   Resp 11   SpO2 98%   Physical  Exam  Constitutional: He is oriented to person, place, and time. He appears well-developed and well-nourished. No distress.  Obese male. Awake and alert. Conversant. No shortness of breath with conversation.  HENT:  Head: Normocephalic.  Eyes: Conjunctivae are normal. Pupils are equal, round, and reactive to light. No scleral icterus.  Neck: Normal range of motion. Neck supple. No thyromegaly present.  Cardiovascular: Normal rate and regular rhythm.  Exam reveals no gallop and no friction rub.   No murmur heard. Pulmonary/Chest: Effort normal and breath sounds normal. No respiratory distress. He has no wheezes. He has no rales.  Points to his left lateral inferior chest as area of discomfort. No wheezing rales rhonchi or prolongation. No abnormal breath sounds. No pleural or pericardial friction rubs noted.  Abdominal: Soft. Bowel sounds are normal. He exhibits no distension. There is no tenderness. There is no rebound.  Musculoskeletal: Normal range of motion.  Neurological: He is alert and oriented to person, place, and time.  Skin: Skin is warm and dry. No rash noted.  No dependent edema. No cording or swelling or asymmetry  Psychiatric: He has a normal mood and affect. His behavior is normal.     ED Treatments / Results  Labs (all labs ordered are listed, but only abnormal results are displayed) Labs Reviewed  BASIC METABOLIC PANEL - Abnormal; Notable for the following:       Result Value   Glucose, Bld 202 (*)    BUN 25 (*)    All other components within normal limits  CBC - Abnormal; Notable for the following:    Hemoglobin 12.9 (*)    All other components within normal limits  Randolm Idol, ED  Randolm Idol, ED    EKG  EKG Interpretation  Date/Time:  Thursday January 22 2017 07:21:33 EST Ventricular Rate:  85 PR Interval:    QRS Duration: 100 QT Interval:  372 QTC Calculation: 443 R Axis:   44 Text Interpretation:  Sinus rhythm Confirmed by Jeneen Rinks  MD,  [facility] ([id number]) on 01/22/2017 8:09:47 AM       Radiology Dg Chest 2 View  Result Date: 01/22/2017 CLINICAL DATA:  LEFT-sided chest pain.  Shortness of breath. EXAM: CHEST  2 VIEW COMPARISON:  12/15/2012. FINDINGS: The heart size and mediastinal contours are within normal limits. Both lungs are clear. The visualized skeletal structures are unremarkable. IMPRESSION: No active cardiopulmonary disease. Electronically Signed   By: Staci Righter M.D.   On: 01/22/2017 08:06    Procedures Procedures (including critical care time)  Medications Ordered in ED Medications  ketorolac (TORADOL) 30 MG/ML injection 30 mg (30 mg Intravenous Given 01/22/17 0904)     Initial Impression / Assessment and Plan / ED Course  I have reviewed the triage vital signs and the nursing notes.  Pertinent labs & imaging results that were available during my care of the patient were reviewed by me and considered in my medical decision making (see chart for details).     Normal EKG. Heart score is 3. One for age 4 for risks. Troponin normal. We'll send a troponin. IV Toradol. Chest x-ray normal. Likely pleuritic or chest wall pain. If second troponin negative, we will refer to cardiology for outpatient testing considering patient's slight risks  Final Clinical Impressions(s) / ED Diagnoses   Final diagnoses:  Chest pain, unspecified type  Pleurisy    New Prescriptions New Prescriptions   NAPROXEN (NAPROSYN) 500 MG TABLET    Take 1 tablet (500 mg total) by mouth 2 (two) times daily.     Tanna Furry, MD 01/22/17 1215

## 2017-01-22 NOTE — Discharge Instructions (Signed)
Naproxen as needed for the pain in her chest.  You have been given the name and information for [facility] cardiology. Call for an outpatient appointment.

## 2017-01-22 NOTE — ED Triage Notes (Signed)
Pt states at 3 am he started having left sided chest pain.  A little shortness of breath.  No n/v.  No cough.  Some congestion.  No fever

## 2017-01-22 NOTE — ED Notes (Signed)
Patient transported to X-ray 

## 2018-06-09 ENCOUNTER — Emergency Department (HOSPITAL_COMMUNITY)
Admission: EM | Admit: 2018-06-09 | Discharge: 2018-06-09 | Disposition: A | Discharge status: Home / Self Care | Payer: BLUE CROSS/BLUE SHIELD | Attending: Emergency Medicine | Admitting: Emergency Medicine

## 2018-06-09 ENCOUNTER — Emergency Department (HOSPITAL_COMMUNITY): Payer: BLUE CROSS/BLUE SHIELD

## 2018-06-09 ENCOUNTER — Other Ambulatory Visit: Payer: Self-pay

## 2018-06-09 ENCOUNTER — Encounter (HOSPITAL_COMMUNITY): Payer: Self-pay | Admitting: Emergency Medicine

## 2018-06-09 DIAGNOSIS — Z79899 Other long term (current) drug therapy: Secondary | ICD-10-CM | POA: Diagnosis not present

## 2018-06-09 DIAGNOSIS — Y999 Unspecified external cause status: Secondary | ICD-10-CM | POA: Diagnosis not present

## 2018-06-09 DIAGNOSIS — Z23 Encounter for immunization: Secondary | ICD-10-CM | POA: Diagnosis not present

## 2018-06-09 DIAGNOSIS — Z87891 Personal history of nicotine dependence: Secondary | ICD-10-CM | POA: Diagnosis not present

## 2018-06-09 DIAGNOSIS — M25511 Pain in right shoulder: Secondary | ICD-10-CM | POA: Insufficient documentation

## 2018-06-09 DIAGNOSIS — S0001XA Abrasion of scalp, initial encounter: Secondary | ICD-10-CM | POA: Insufficient documentation

## 2018-06-09 DIAGNOSIS — W0110XA Fall on same level from slipping, tripping and stumbling with subsequent striking against unspecified object, initial encounter: Secondary | ICD-10-CM | POA: Insufficient documentation

## 2018-06-09 DIAGNOSIS — Y9301 Activity, walking, marching and hiking: Secondary | ICD-10-CM | POA: Insufficient documentation

## 2018-06-09 DIAGNOSIS — T148XXA Other injury of unspecified body region, initial encounter: Secondary | ICD-10-CM

## 2018-06-09 DIAGNOSIS — M25551 Pain in right hip: Secondary | ICD-10-CM | POA: Diagnosis not present

## 2018-06-09 DIAGNOSIS — Z794 Long term (current) use of insulin: Secondary | ICD-10-CM | POA: Diagnosis not present

## 2018-06-09 DIAGNOSIS — Z7982 Long term (current) use of aspirin: Secondary | ICD-10-CM | POA: Diagnosis not present

## 2018-06-09 DIAGNOSIS — E119 Type 2 diabetes mellitus without complications: Secondary | ICD-10-CM | POA: Diagnosis not present

## 2018-06-09 DIAGNOSIS — S0990XA Unspecified injury of head, initial encounter: Secondary | ICD-10-CM | POA: Diagnosis present

## 2018-06-09 DIAGNOSIS — Y92009 Unspecified place in unspecified non-institutional (private) residence as the place of occurrence of the external cause: Secondary | ICD-10-CM | POA: Diagnosis not present

## 2018-06-09 DIAGNOSIS — W19XXXA Unspecified fall, initial encounter: Secondary | ICD-10-CM

## 2018-06-09 MED ORDER — TETANUS-DIPHTH-ACELL PERTUSSIS 5-2.5-18.5 LF-MCG/0.5 IM SUSP
0.5000 mL | Freq: Once | INTRAMUSCULAR | Status: AC
  Administered 2018-06-09: 0.5 mL via INTRAMUSCULAR
  Filled 2018-06-09: qty 0.5

## 2018-06-09 MED ORDER — KETOROLAC TROMETHAMINE 60 MG/2ML IM SOLN
30.0000 mg | Freq: Once | INTRAMUSCULAR | Status: AC
  Administered 2018-06-09: 30 mg via INTRAMUSCULAR
  Filled 2018-06-09: qty 2

## 2018-06-09 MED ORDER — LIDOCAINE 5 % EX PTCH: 1.0000 | MEDICATED_PATCH | CUTANEOUS | 0 refills | Status: DC

## 2018-06-09 MED ORDER — LIDOCAINE 5 % EX PTCH
2.0000 | MEDICATED_PATCH | CUTANEOUS | Status: DC
  Administered 2018-06-09: 2 via TRANSDERMAL
  Filled 2018-06-09: qty 2

## 2018-06-09 MED ORDER — NAPROXEN 375 MG PO TABS: 375.0000 mg | ORAL_TABLET | Freq: Two times a day (BID) | ORAL | 0 refills | Status: DC

## 2018-06-09 NOTE — ED Notes (Signed)
ED Provider at bedside. 

## 2018-06-09 NOTE — ED Triage Notes (Signed)
Pt arriving after fall at home. Pt states he tripped and fell landing on his right hip and shoulder. Pt also reports he hit his head. Denies LOC. No vision changes.

## 2018-06-09 NOTE — ED Provider Notes (Signed)
[facility] DEPT Provider Note   CSN: [id number] Arrival date & time: 06/09/18  0443     History   Chief Complaint Chief Complaint  Patient presents with  . Fall  . Hip Pain    right    HPI Jerry Shaffer is a 57 y.o. male.  The history is provided by the patient.  Fall  This is a new problem. The current episode started less than 1 hour ago. The problem occurs constantly. The problem has not changed since onset.Pertinent negatives include no chest pain, no abdominal pain, no headaches and no shortness of breath. Nothing aggravates the symptoms. Nothing relieves the symptoms. He has tried nothing for the symptoms. The treatment provided no relief.  Shoulder Injury  This is a new problem. The current episode started less than 1 hour ago. The problem occurs constantly. The problem has not changed since onset.Pertinent negatives include no chest pain, no abdominal pain, no headaches and no shortness of breath. Nothing aggravates the symptoms. Nothing relieves the symptoms. He has tried nothing for the symptoms. The treatment provided no relief.  Drove self here without difficulty.  No foreshortening or rotation of the RLE.  Is moving it in all directions.  FROM of the right shoulder but reports significant pain.  No neck pain no LOC.  Drove himself here.    Past Medical History:  Diagnosis Date  . Bipolar 1 disorder ([place])   . Diabetes mellitus   . Dyslipidemia   . Emphysema   . Mental disorder    BIPOLAR DISORDER  . Migraine   . PONV (postoperative nausea and vomiting)   . Shortness of breath   . Stroke ([place])   . Transient ischemic attack (TIA)     Patient Active Problem List   Diagnosis Date Noted  . Cellulitis of hand, left 05/03/2013  . Diverticulitis 07/26/2012  . Left facial numbness 07/26/2012  . Headache(784.0) 07/26/2012  . DM2 (diabetes mellitus, type 2) ([place]) 07/26/2012  . TIA (transient ischemic attack) 04/19/2012  . ATTENTION  DEFICIT HYPERACTIVITY DISORDER, ADULT 07/25/2010  . GERD 07/25/2010  . DIVERTICULOSIS OF COLON 07/25/2010  . ABDOMINAL PAIN, LEFT LOWER QUADRANT 07/25/2010  . DIABETES MELLITUS, CONTROLLED 12/21/2009  . MIGRAINE HEADACHE 12/21/2009  . MICROSCOPIC HEMATURIA 12/21/2009  . KNEE PAIN, LEFT 12/21/2009  . CHEST PAIN, INTERMITTENT 12/21/2009  . HYPERTENSION NEC 12/21/2009    Past Surgical History:  Procedure Laterality Date  . HEMORROIDECTOMY    . KNEE ARTHROSCOPY    . LUMBAR DISC SURGERY    . TONSILLECTOMY AND ADENOIDECTOMY          Home Medications    Prior to Admission medications   Medication Sig Start Date End Date Taking? Authorizing Provider  albuterol (PROVENTIL HFA;VENTOLIN HFA) 108 (90 Base) MCG/ACT inhaler Inhale 1-2 puffs into the lungs every 6 (six) hours as needed for wheezing or shortness of breath.    [provider]  aspirin 81 MG tablet Take 81 mg by mouth 2 (two) times daily. 04/20/12   Monika Salk, MD  b complex-C-folic acid 1 MG capsule Take 1 capsule by mouth daily.    [provider]  ciprofloxacin-hydrocortisone (CIPRO HC) otic suspension Place 3 drops into the left ear 2 (two) times daily. Patient not taking: Reported on 01/22/2017 01/03/16   Charlann Lange, PA-C  glipiZIDE (GLUCOTROL XL) 10 MG 24 hr tablet Take 10 mg by mouth daily with breakfast.    [provider]  HYDROcodone-acetaminophen (NORCO/VICODIN)  5-325 MG tablet Take 1 tablet by mouth every 4 (four) hours as needed. Patient not taking: Reported on 01/22/2017 04/10/16   Marella Chimes, PA-C  ibuprofen (ADVIL,MOTRIN) 800 MG tablet Take 1 tablet (800 mg total) by mouth 3 (three) times daily. Patient not taking: Reported on 01/22/2017 04/10/16   Marella Chimes, PA-C  Insulin Detemir (LEVEMIR FLEXTOUCH) 100 UNIT/ML Pen Inject 50 Units into the skin 2 (two) times daily.    [provider]  lamoTRIgine (LAMICTAL) 100 MG tablet Take 100 mg by mouth 2 (two) times  daily.    [provider]  metFORMIN (GLUCOPHAGE) 1000 MG tablet Take 1,000 mg by mouth 2 (two) times daily with a meal.    [provider]  methocarbamol (ROBAXIN) 500 MG tablet Take 1 tablet (500 mg total) by mouth 2 (two) times daily. Patient not taking: Reported on 01/22/2017 04/10/16   Marella Chimes, PA-C  naproxen (NAPROSYN) 500 MG tablet Take 1 tablet (500 mg total) by mouth 2 (two) times daily. 01/22/17   Tanna Furry, MD  predniSONE (DELTASONE) 10 MG tablet Take 6 on day 1 Take 5 on day 2 Take 4 on day 3 Take 3 on day 4 Take 2 on day 5 Take 1 on day 6 Patient not taking: Reported on 01/22/2017 01/03/16   Charlann Lange, PA-C    Family History Family History  Problem Relation Age of Onset  . Diabetes type II Unknown   . Coronary artery disease Unknown   . Bipolar disorder Unknown   . Lung cancer Father     Social History Social History   Tobacco Use  . Smoking status: Former Smoker    Years: 30.00    Types: Cigarettes  . Smokeless tobacco: Current User  Substance Use Topics  . Alcohol use: No  . Drug use: No     Allergies   Bee venom; Penicillins; and Procaine   Review of Systems Review of Systems  Constitutional: Negative for fever.  HENT: Negative for facial swelling.   Eyes: Negative for photophobia and visual disturbance.  Respiratory: Negative for shortness of breath.   Cardiovascular: Negative for chest pain, palpitations and leg swelling.  Gastrointestinal: Negative for abdominal pain.  Genitourinary: Negative for flank pain and genital sores.  Musculoskeletal: Positive for arthralgias. Negative for back pain, joint swelling and neck pain.  Neurological: Negative for dizziness, tremors, seizures, syncope, facial asymmetry, speech difficulty, weakness, light-headedness, numbness and headaches.  All other systems reviewed and are negative.    Physical Exam Updated Vital Signs BP (!) 157/101 (BP Location: Right Arm)   Pulse 87    Temp 98 F (36.7 C) (Oral)   Resp 15   Ht 5\' 8"  (1.727 m)   Wt 113.4 kg (250 lb)   BMI 38.01 kg/m   Physical Exam  Constitutional: He is oriented to person, place, and time. He appears well-developed and well-nourished. No distress.  HENT:  Head: Normocephalic. Head is without raccoon's eyes and without Battle's sign.    Right Ear: No mastoid tenderness. No hemotympanum.  Left Ear: No mastoid tenderness. No hemotympanum.  Nose: Nose normal.  Mouth/Throat: No oropharyngeal exudate.  Eyes: Pupils are equal, round, and reactive to light. Conjunctivae and EOM are normal.  Neck: Normal range of motion. Neck supple.  Cardiovascular: Normal rate, regular rhythm, normal heart sounds and intact distal pulses.  Pulmonary/Chest: Effort normal and breath sounds normal. No stridor. He has no wheezes. He has no rales.  Abdominal: Soft. Bowel  sounds are normal. He exhibits no mass. There is no tenderness. There is no rebound and no guarding.  Musculoskeletal: Normal range of motion.       Right shoulder: Normal.       Right elbow: Normal.      Right wrist: Normal.       Right hip: He exhibits normal range of motion, normal strength, no bony tenderness, no swelling, no crepitus, no deformity and no laceration.       Left hip: Normal.       Right knee: Normal.       Left knee: Normal.       Right ankle: Normal. Achilles tendon normal.       Left ankle: Normal. Achilles tendon normal.       Cervical back: Normal.       Thoracic back: Normal.       Lumbar back: Normal.       Right forearm: Normal.       Right foot: Normal.       Left foot: Normal.  Neurological: He is alert and oriented to person, place, and time. He displays normal reflexes.  Skin: Skin is warm and dry. Capillary refill takes less than 2 seconds.     ED Treatments / Results  Labs (all labs ordered are listed, but only abnormal results are displayed) Labs Reviewed - No data to display  EKG None  Radiology Results  for orders placed or performed during the hospital encounter of 80/03/49  Basic metabolic panel  Result Value Ref Range   Sodium 136 135 - 145 mmol/L   Potassium 3.7 3.5 - 5.1 mmol/L   Chloride 101 101 - 111 mmol/L   CO2 25 22 - 32 mmol/L   Glucose, Bld 202 (H) 65 - 99 mg/dL   BUN 25 (H) 6 - 20 mg/dL   Creatinine, Ser 0.94 0.61 - 1.24 mg/dL   Calcium 9.1 8.9 - 10.3 mg/dL   GFR calc non Af Amer >60 >60 mL/min   GFR calc Af Amer >60 >60 mL/min   Anion gap 10 5 - 15  CBC  Result Value Ref Range   WBC 9.1 4.0 - 10.5 K/uL   RBC 4.65 4.22 - 5.81 MIL/uL   Hemoglobin 12.9 (L) 13.0 - 17.0 g/dL   HCT 39.8 39.0 - 52.0 %   MCV 85.6 78.0 - 100.0 fL   MCH 27.7 26.0 - 34.0 pg   MCHC 32.4 30.0 - 36.0 g/dL   RDW 13.8 11.5 - 15.5 %   Platelets 268 150 - 400 K/uL  I-stat troponin, ED  Result Value Ref Range   Troponin i, poc 0.00 0.00 - 0.08 ng/mL   Comment 3          I-stat troponin, ED  Result Value Ref Range   Troponin i, poc 0.00 0.00 - 0.08 ng/mL   Comment 3           Dg Shoulder Right  Result Date: 06/09/2018 CLINICAL DATA:  57 y/o  M; fall with right shoulder pain. EXAM: RIGHT SHOULDER - 2+ VIEW COMPARISON:  07/21/2011 right shoulder radiograph FINDINGS: There is no evidence of fracture or dislocation. There is no evidence of arthropathy or other focal bone abnormality. Soft tissues are unremarkable. IMPRESSION: No acute fracture or dislocation identified. Electronically Signed   By: Kristine Garbe M.D.   On: 06/09/2018 06:41   Ct Head Wo Contrast  Result Date: 06/09/2018 CLINICAL DATA:  57 year old male with fall.  EXAM: CT HEAD WITHOUT CONTRAST TECHNIQUE: Contiguous axial images were obtained from the base of the skull through the vertex without intravenous contrast. COMPARISON:  Brain MRI dated 08/24/2015 and head CT dated 07/26/2012 FINDINGS: Brain: No evidence of acute infarction, hemorrhage, hydrocephalus, extra-axial collection or mass lesion/mass effect. Vascular: No  hyperdense vessel or unexpected calcification. Skull: Normal. Negative for fracture or focal lesion. Sinuses/Orbits: No acute finding. Other: None. IMPRESSION: Unremarkable noncontrast CT of the brain. Electronically Signed   By: Anner Crete M.D.   On: 06/09/2018 06:08   Dg Hip Unilat W Or Wo Pelvis 2-3 Views Right  Result Date: 06/09/2018 CLINICAL DATA:  57 y/o M; fall with right shoulder and right hip pain. EXAM: DG HIP (WITH OR WITHOUT PELVIS) 2-3V RIGHT COMPARISON:  None. FINDINGS: There is no evidence of hip fracture or dislocation. There is no evidence of arthropathy or other focal bone abnormality. IMPRESSION: Negative. Electronically Signed   By: Kristine Garbe M.D.   On: 06/09/2018 06:26    Procedures Procedures (including critical care time)  Medications Ordered in ED Medications  lidocaine (LIDODERM) 5 % 2 patch (2 patches Transdermal Patch Applied 06/09/18 0512)  ketorolac (TORADOL) injection 30 mg (30 mg Intramuscular Given 06/09/18 0516)  Tdap (BOOSTRIX) injection 0.5 mL (0.5 mLs Intramuscular Given 06/09/18 0513)      Final Clinical Impressions(s) / ED Diagnoses   Will give RX for naproxen and lidoderm.  I do not believe this patient requires narcotic pain medication though he is requesting same.  Apply ice to affected areas for 20 minutes every 2 hours.    Return for weakness, numbness, changes in vision or speech, fevers >100.4 unrelieved by medication, shortness of breath, intractable vomiting, or diarrhea, abdominal pain, Inability to tolerate liquids or food, cough, altered mental status or any concerns. No signs of systemic illness or infection. The patient is nontoxic-appearing on exam and vital signs are within normal limits. Will refer to urology for microscopy hematuria as patient is asymptomatic.    I have reviewed the triage vital signs and the nursing notes. Pertinent labs &imaging results that were available during my care of the patient were  reviewed by me and considered in my medical decision making (see chart for details).  After history, exam, and medical workup I feel the patient has been appropriately medically screened and is safe for discharge home. Pertinent diagnoses were discussed with the patient. Patient was given return precautions.    Talley Kreiser, MD 06/09/18 [id number]

## 2018-06-09 NOTE — ED Notes (Signed)
Pt has small abrasion on top of scalp. Cleaned with saline.

## 2018-11-18 ENCOUNTER — Other Ambulatory Visit: Payer: Self-pay

## 2018-11-18 ENCOUNTER — Encounter (HOSPITAL_COMMUNITY): Payer: Self-pay | Admitting: *Deleted

## 2018-11-18 ENCOUNTER — Emergency Department (HOSPITAL_COMMUNITY)
Admission: EM | Admit: 2018-11-18 | Discharge: 2018-11-19 | Disposition: A | Discharge status: Home / Self Care | Payer: BLUE CROSS/BLUE SHIELD | Attending: Emergency Medicine | Admitting: Emergency Medicine

## 2018-11-18 DIAGNOSIS — E119 Type 2 diabetes mellitus without complications: Secondary | ICD-10-CM | POA: Insufficient documentation

## 2018-11-18 DIAGNOSIS — S0591XA Unspecified injury of right eye and orbit, initial encounter: Secondary | ICD-10-CM | POA: Diagnosis present

## 2018-11-18 DIAGNOSIS — Z87891 Personal history of nicotine dependence: Secondary | ICD-10-CM | POA: Insufficient documentation

## 2018-11-18 DIAGNOSIS — X58XXXA Exposure to other specified factors, initial encounter: Secondary | ICD-10-CM | POA: Diagnosis not present

## 2018-11-18 DIAGNOSIS — Z79899 Other long term (current) drug therapy: Secondary | ICD-10-CM | POA: Insufficient documentation

## 2018-11-18 DIAGNOSIS — Y9389 Activity, other specified: Secondary | ICD-10-CM | POA: Insufficient documentation

## 2018-11-18 DIAGNOSIS — Y929 Unspecified place or not applicable: Secondary | ICD-10-CM | POA: Insufficient documentation

## 2018-11-18 DIAGNOSIS — Y999 Unspecified external cause status: Secondary | ICD-10-CM | POA: Insufficient documentation

## 2018-11-18 DIAGNOSIS — Z8673 Personal history of transient ischemic attack (TIA), and cerebral infarction without residual deficits: Secondary | ICD-10-CM | POA: Diagnosis not present

## 2018-11-18 DIAGNOSIS — I10 Essential (primary) hypertension: Secondary | ICD-10-CM | POA: Diagnosis not present

## 2018-11-18 DIAGNOSIS — S0501XA Injury of conjunctiva and corneal abrasion without foreign body, right eye, initial encounter: Secondary | ICD-10-CM

## 2018-11-18 MED ORDER — FLUORESCEIN SODIUM 1 MG OP STRP
1.0000 | ORAL_STRIP | Freq: Once | OPHTHALMIC | Status: AC
  Administered 2018-11-19: 1 via OPHTHALMIC
  Filled 2018-11-18: qty 1

## 2018-11-18 NOTE — ED Notes (Addendum)
Visual acuity: both eyes:20/100, lt eye:20/100, rt. Eye 20/100( rt. eye is watering). Pt. Wears corrective lenses but left them at home.

## 2018-11-18 NOTE — ED Triage Notes (Signed)
Pt reports since waking up this morning it feels like he has something in his right eye. Right eye irritation, "feels like gravel", watery, and blurry vision.

## 2018-11-19 MED ORDER — OFLOXACIN 0.3 % OP SOLN: 2.0000 [drp] | Freq: Four times a day (QID) | OPHTHALMIC | 0 refills | Status: DC

## 2018-11-19 MED ORDER — OFLOXACIN 0.3 % OP SOLN
2.0000 [drp] | Freq: Once | OPHTHALMIC | Status: AC
  Administered 2018-11-19: 2 [drp] via OPHTHALMIC
  Filled 2018-11-19: qty 5

## 2018-11-19 NOTE — ED Provider Notes (Signed)
[facility] DEPT Provider Note   CSN: [id number] Arrival date & time: 11/18/18  2321     History   Chief Complaint Chief Complaint  Patient presents with  . Eye Problem    HPI Jerry Shaffer is a 57 y.o. male past with history of bipolar, diabetes, dyslipidemia, migraine who presents for evaluation of right eye pain, redness, irritation that began this morning when he woke up.  He states that since he is woken up, he felt like there is something in his eye.  He states he used eyewash and eyedrops at home with no improvement.  He states that he has not had any trauma or injury to the eye.  He does not recall getting an thing in the eye.  States he does wear glasses but no contacts.  He does report some blurry vision associated.  He states he has not had any fevers or rashes.  The history is provided by the patient.    Past Medical History:  Diagnosis Date  . Bipolar 1 disorder ([place])   . Diabetes mellitus   . Dyslipidemia   . Emphysema   . Mental disorder    BIPOLAR DISORDER  . Migraine   . PONV (postoperative nausea and vomiting)   . Shortness of breath   . Stroke ([place])   . Transient ischemic attack (TIA)     Patient Active Problem List   Diagnosis Date Noted  . Cellulitis of hand, left 05/03/2013  . Diverticulitis 07/26/2012  . Left facial numbness 07/26/2012  . Headache(784.0) 07/26/2012  . DM2 (diabetes mellitus, type 2) ([place]) 07/26/2012  . TIA (transient ischemic attack) 04/19/2012  . ATTENTION DEFICIT HYPERACTIVITY DISORDER, ADULT 07/25/2010  . GERD 07/25/2010  . DIVERTICULOSIS OF COLON 07/25/2010  . ABDOMINAL PAIN, LEFT LOWER QUADRANT 07/25/2010  . DIABETES MELLITUS, CONTROLLED 12/21/2009  . MIGRAINE HEADACHE 12/21/2009  . MICROSCOPIC HEMATURIA 12/21/2009  . KNEE PAIN, LEFT 12/21/2009  . CHEST PAIN, INTERMITTENT 12/21/2009  . HYPERTENSION NEC 12/21/2009    Past Surgical History:  Procedure Laterality Date  .  HEMORROIDECTOMY    . KNEE ARTHROSCOPY    . LUMBAR DISC SURGERY    . TONSILLECTOMY AND ADENOIDECTOMY          Home Medications    Prior to Admission medications   Medication Sig Start Date End Date Taking? Authorizing Provider  acetaminophen (TYLENOL) 500 MG tablet Take 1,000 mg by mouth every 6 (six) hours as needed for mild pain.    [provider]  ciprofloxacin-hydrocortisone (CIPRO HC) otic suspension Place 3 drops into the left ear 2 (two) times daily. Patient not taking: Reported on 01/22/2017 01/03/16   Charlann Lange, PA-C  HYDROcodone-acetaminophen (NORCO/VICODIN) 5-325 MG tablet Take 1 tablet by mouth every 4 (four) hours as needed. Patient not taking: Reported on 01/22/2017 04/10/16   Marella Chimes, PA-C  ibuprofen (ADVIL,MOTRIN) 200 MG tablet Take 600 mg by mouth every 6 (six) hours as needed for moderate pain.    [provider]  ibuprofen (ADVIL,MOTRIN) 800 MG tablet Take 1 tablet (800 mg total) by mouth 3 (three) times daily. Patient not taking: Reported on 01/22/2017 04/10/16   Marella Chimes, PA-C  lidocaine (LIDODERM) 5 % Place 1 patch onto the skin daily. Remove & Discard patch within 12 hours or as directed by MD 06/09/18   Randal Buba, April, MD  methocarbamol (ROBAXIN) 500 MG tablet Take 1 tablet (500 mg total) by mouth 2 (two) times daily. Patient not taking:  Reported on 01/22/2017 04/10/16   Marella Chimes, PA-C  naproxen (NAPROSYN) 375 MG tablet Take 1 tablet (375 mg total) by mouth 2 (two) times daily. 06/09/18   Palumbo, April, MD  naproxen (NAPROSYN) 500 MG tablet Take 1 tablet (500 mg total) by mouth 2 (two) times daily. Patient not taking: Reported on 06/09/2018 01/22/17   Tanna Furry, MD  ofloxacin (OCUFLOX) 0.3 % ophthalmic solution Place 2 drops into the right eye 4 (four) times daily. 11/19/18   Volanda Napoleon, PA-C  predniSONE (DELTASONE) 10 MG tablet Take 6 on day 1 Take 5 on day 2 Take 4 on day 3 Take 3 on day 4 Take 2 on day  5 Take 1 on day 6 Patient not taking: Reported on 01/22/2017 01/03/16   Charlann Lange, PA-C    Family History Family History  Problem Relation Age of Onset  . Diabetes type II Unknown   . Coronary artery disease Unknown   . Bipolar disorder Unknown   . Lung cancer Father     Social History Social History   Tobacco Use  . Smoking status: Former Smoker    Years: 30.00    Types: Cigarettes  . Smokeless tobacco: Current User  Substance Use Topics  . Alcohol use: No  . Drug use: No     Allergies   Bee venom; Penicillins; and Procaine   Review of Systems Review of Systems  Constitutional: Negative for fever.  Eyes: Positive for pain, redness and visual disturbance. Negative for discharge.  Skin: Negative for rash.  All other systems reviewed and are negative.    Physical Exam Updated Vital Signs BP (!) 153/84 (BP Location: Left Arm)   Pulse 95   Temp 98.9 F (37.2 C) (Oral)   Resp 16   Ht 5\' 8"  (1.727 m)   Wt 109.8 kg   SpO2 99%   BMI 36.80 kg/m   Physical Exam  Constitutional: He appears well-developed and well-nourished.  HENT:  Head: Normocephalic and atraumatic.  Eyes: Pupils are equal, round, and reactive to light. EOM are normal. Right eye exhibits no discharge. Left eye exhibits no discharge. Right conjunctiva is injected. No scleral icterus.  Slit lamp exam:      The right eye shows corneal abrasion.  EOMs intact any difficulty. PERRL.  No tenderness, warmth, erythema, edema noted to bilateral periorbital regions.  Corneal abrasion noted at the 7:00 region.   Pulmonary/Chest: Effort normal.  Neurological: He is alert.  Skin: Skin is warm and dry.  Psychiatric: He has a normal mood and affect. His speech is normal and behavior is normal.  Nursing note and vitals reviewed.    ED Treatments / Results  Labs (all labs ordered are listed, but only abnormal results are displayed) Labs Reviewed - No data to display  EKG None  Radiology No results  found.  Procedures Procedures (including critical care time)  Medications Ordered in ED Medications  fluorescein ophthalmic strip 1 strip (1 strip Right Eye Given by Other 11/19/18 0032)  ofloxacin (OCUFLOX) 0.3 % ophthalmic solution 2 drop (2 drops Right Eye Given 11/19/18 0211)     Initial Impression / Assessment and Plan / ED Course  I have reviewed the triage vital signs and the nursing notes.  Pertinent labs & imaging results that were available during my care of the patient were reviewed by me and considered in my medical decision making (see chart for details).     57 year old male who presents for evaluation of  right eye redness, pain that began this morning.  He feels like something is in his eye.  No trauma to the eye.  Reports some associated blurry vision. Patient is afebrile, non-toxic appearing, sitting comfortably on examination table. Vital signs reviewed and stable.  EOMs intact with any difficulty.  PERRLA.  Conjunctival is injected.  No periorbital edema, erythema, tenderness.  Concern for corneal abrasion versus conjunctivitis.  History/physical exam is not concerning for preseptal or orbital cellulitis.  Will plan for visual acuity, evaluation with Sherral Hammers and slit-lamp.  Visual acuity as documented in chart.  Reevaluation with Sherral Hammers lamp shows evidence of corneal abrasion at approximately the 7:00 region.  Negative Seidel sign.  No dendritic lesion.  Evaluation with slit-lamp reveals no evidence of foreign body.  No cells or flares.  Unable to assess IOP's as patient is not able to tolerate Tono-Pen.  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  He states he has gotten numbing drops in his eye before but then he states it was a yellow dye.  Concerned that he is actually talked about floor seen.  I reviewed his chart and he has no evidence of previous tetracaine use.  I discussed with pharmacy and they recommend given the high anaphylaxis reaction to procaine, avoiding use of  tetracaine in the eye as it could induce anaphylaxis reaction.   Patient states he still feels like there is something in his eyelid.  I everted the eyelid and showed no evidence of foreign body.  Additionally, he has no vertical scratches noted on the cornea that I can see.  We will plan to flush his eyes reassess.  Patient reports improvement in pain but does still feel like there is something in the eye.  I again reevaluated and everted the lid with no evidence of foreign body.  Will plan to treat corneal abrasion with antibiotic eyedrops.  Patient instructed to follow-up with referred ophthalmologist tomorrow morning for further evaluation. Patient had ample opportunity for questions and discussion. All patient's questions were answered with full understanding. Strict return precautions discussed. Patient expresses understanding and agreement to plan.   Final Clinical Impressions(s) / ED Diagnoses   Final diagnoses:  Abrasion of right cornea, initial encounter    ED Discharge Orders         Ordered    ofloxacin (OCUFLOX) 0.3 % ophthalmic solution  4 times daily     11/19/18 0206           Volanda Napoleon, PA-C 11/19/18 0250    Ward, Delice Bison, DO 11/19/18 0737

## 2018-11-19 NOTE — ED Notes (Signed)
ED Provider at bedside. 

## 2018-11-19 NOTE — Discharge Instructions (Signed)
Use antibiotic eyedrops as directed.  Call Dr. Trena Platt office in the morning to arrange an appointment.  Return emergency department for any worsening pain, fever, swelling or redness of the face or any other worsening or concerning symptoms.

## 2019-02-20 ENCOUNTER — Emergency Department (HOSPITAL_COMMUNITY): Payer: BLUE CROSS/BLUE SHIELD

## 2019-02-20 ENCOUNTER — Other Ambulatory Visit: Payer: Self-pay

## 2019-02-20 ENCOUNTER — Encounter (HOSPITAL_COMMUNITY): Payer: Self-pay | Admitting: *Deleted

## 2019-02-20 DIAGNOSIS — R05 Cough: Secondary | ICD-10-CM | POA: Insufficient documentation

## 2019-02-20 DIAGNOSIS — F17228 Nicotine dependence, chewing tobacco, with other nicotine-induced disorders: Secondary | ICD-10-CM | POA: Diagnosis not present

## 2019-02-20 DIAGNOSIS — Z79899 Other long term (current) drug therapy: Secondary | ICD-10-CM | POA: Insufficient documentation

## 2019-02-20 DIAGNOSIS — R071 Chest pain on breathing: Secondary | ICD-10-CM | POA: Insufficient documentation

## 2019-02-20 DIAGNOSIS — Z8673 Personal history of transient ischemic attack (TIA), and cerebral infarction without residual deficits: Secondary | ICD-10-CM | POA: Insufficient documentation

## 2019-02-20 DIAGNOSIS — J209 Acute bronchitis, unspecified: Secondary | ICD-10-CM | POA: Insufficient documentation

## 2019-02-20 DIAGNOSIS — E119 Type 2 diabetes mellitus without complications: Secondary | ICD-10-CM | POA: Insufficient documentation

## 2019-02-20 DIAGNOSIS — R062 Wheezing: Secondary | ICD-10-CM | POA: Diagnosis not present

## 2019-02-20 DIAGNOSIS — R1110 Vomiting, unspecified: Secondary | ICD-10-CM | POA: Diagnosis not present

## 2019-02-20 DIAGNOSIS — R0981 Nasal congestion: Secondary | ICD-10-CM | POA: Diagnosis present

## 2019-02-20 DIAGNOSIS — R0602 Shortness of breath: Secondary | ICD-10-CM | POA: Insufficient documentation

## 2019-02-20 MED ORDER — ALBUTEROL SULFATE (2.5 MG/3ML) 0.083% IN NEBU
5.0000 mg | INHALATION_SOLUTION | Freq: Once | RESPIRATORY_TRACT | Status: AC
  Administered 2019-02-21: 5 mg via RESPIRATORY_TRACT
  Filled 2019-02-20: qty 6

## 2019-02-20 NOTE — ED Triage Notes (Signed)
Pt c/o SOB & cough that started Saturday and diarrhea started today.  Pt does not have a PCP.

## 2019-02-21 ENCOUNTER — Emergency Department (HOSPITAL_COMMUNITY)
Admission: EM | Admit: 2019-02-21 | Discharge: 2019-02-21 | Disposition: A | Discharge status: Home / Self Care | Payer: BLUE CROSS/BLUE SHIELD | Attending: Emergency Medicine | Admitting: Emergency Medicine

## 2019-02-21 DIAGNOSIS — J209 Acute bronchitis, unspecified: Secondary | ICD-10-CM

## 2019-02-21 MED ORDER — IPRATROPIUM-ALBUTEROL 0.5-2.5 (3) MG/3ML IN SOLN: 3.0000 mL | RESPIRATORY_TRACT | Status: DC

## 2019-02-21 MED ORDER — FLUTICASONE-SALMETEROL 250-50 MCG/DOSE IN AEPB: INHALATION_SPRAY | RESPIRATORY_TRACT | 0 refills | Status: DC

## 2019-02-21 MED ORDER — DOXYCYCLINE HYCLATE 100 MG PO TABS
100.0000 mg | ORAL_TABLET | Freq: Once | ORAL | Status: AC
  Administered 2019-02-21: 100 mg via ORAL
  Filled 2019-02-21: qty 1

## 2019-02-21 MED ORDER — NAPROXEN 375 MG PO TABS: ORAL_TABLET | ORAL | 0 refills | Status: DC

## 2019-02-21 MED ORDER — ALBUTEROL SULFATE HFA 108 (90 BASE) MCG/ACT IN AERS
2.0000 | INHALATION_SPRAY | RESPIRATORY_TRACT | Status: DC | PRN
  Filled 2019-02-21: qty 6.7

## 2019-02-21 MED ORDER — DOXYCYCLINE HYCLATE 100 MG PO CAPS: 100.0000 mg | ORAL_CAPSULE | Freq: Two times a day (BID) | ORAL | 0 refills | Status: DC

## 2019-02-21 MED ORDER — NAPROXEN 500 MG PO TABS
500.0000 mg | ORAL_TABLET | Freq: Once | ORAL | Status: AC
  Administered 2019-02-21: 500 mg via ORAL
  Filled 2019-02-21: qty 1

## 2019-02-21 NOTE — ED Provider Notes (Signed)
[facility] DEPT Provider Note: Jerry Spurling, MD, FACEP  CSN: [id number] MRN: [id number] ARRIVAL: 02/20/19 at [facility]: [facility]   HISTORY OF PRESENT ILLNESS  02/21/19 12:17 AM Jerry Shaffer is a 58 y.o. male with a history of frequent bronchitis.  He has had a "head cold" for the past week.  With this he has had nasal congestion and cough.  2 days ago he developed shortness of breath with wheezing and worsening of the cough.  The cough has been paroxysmal and has been severe enough to cause posttussive emesis.  It has been productive of green sputum.  He does not have an albuterol inhaler and he has not taken any other medications for his symptoms.  Yesterday he developed diarrhea which he states is been profuse.  He denies nausea, vomiting or abdominal pain.  He is having chest discomfort when he takes deep breaths or cough.    Past Medical History:  Diagnosis Date  . Bipolar 1 disorder ([place])   . Diabetes mellitus   . Dyslipidemia   . Emphysema   . Mental disorder    BIPOLAR DISORDER  . Migraine   . PONV (postoperative nausea and vomiting)   . Shortness of breath   . Stroke ([place])   . Transient ischemic attack (TIA)     Past Surgical History:  Procedure Laterality Date  . HEMORROIDECTOMY    . KNEE ARTHROSCOPY    . LUMBAR DISC SURGERY    . TONSILLECTOMY AND ADENOIDECTOMY      Family History  Problem Relation Age of Onset  . Diabetes type II Other   . Coronary artery disease Other   . Bipolar disorder Other   . Lung cancer Father     Social History   Tobacco Use  . Smoking status: Former Smoker    Years: 30.00    Types: Cigarettes  . Smokeless tobacco: Current User  Substance Use Topics  . Alcohol use: No  . Drug use: No    Prior to Admission medications   Medication Sig Start Date End Date Taking? Authorizing Provider  acetaminophen (TYLENOL) 500 MG tablet Take 1,000 mg by mouth every 6 (six) hours as needed  for mild pain.    [provider]  doxycycline (VIBRAMYCIN) 100 MG capsule Take 1 capsule (100 mg total) by mouth 2 (two) times daily. One po bid x 7 days 02/21/19   Tedford Berg, MD  Fluticasone-Salmeterol (ADVAIR DISKUS) 250-50 MCG/DOSE AEPB Take 1 puff twice daily.  Use regularly, not a rescue inhaler. 02/21/19   Kadarius Cuffe, MD  ibuprofen (ADVIL,MOTRIN) 200 MG tablet Take 600 mg by mouth every 6 (six) hours as needed for moderate pain.    [provider]  lidocaine (LIDODERM) 5 % Place 1 patch onto the skin daily. Remove & Discard patch within 12 hours or as directed by MD 06/09/18   Randal Buba, April, MD  naproxen (NAPROSYN) 375 MG tablet Take 1 tablet twice daily as needed for chest wall pain. 02/21/19   Corie Allis, MD  ofloxacin (OCUFLOX) 0.3 % ophthalmic solution Place 2 drops into the right eye 4 (four) times daily. 11/19/18   Volanda Napoleon, PA-C    Allergies Bee venom; Penicillins; and Procaine   REVIEW OF SYSTEMS  Negative except as noted here or in the History of Present Illness.   PHYSICAL EXAMINATION  Initial Vital Signs Blood pressure (!) 158/96, pulse (!) 104, temperature 99.1 F (37.3 C),  temperature source Oral, resp. rate 18, height 5\' 8"  (1.727 m), weight 106.6 kg, SpO2 97 %.  Examination General: Well-developed, well-nourished male in no acute distress; appearance consistent with age of record HENT: normocephalic; atraumatic Eyes: pupils equal, round and reactive to light; extraocular muscles intact Neck: supple Heart: regular rate and rhythm Lungs: Decreased air movement bilaterally Abdomen: soft; nondistended; nontender; bowel sounds present Extremities: No deformity; full range of motion; pulses normal Neurologic: Awake, alert and oriented; motor function intact in all extremities and symmetric; no facial droop Skin: Warm and dry Psychiatric: Normal mood and affect   RESULTS  Summary of this visit's results, reviewed by myself:   EKG  Interpretation  Date/Time:    Ventricular Rate:    PR Interval:    QRS Duration:   QT Interval:    QTC Calculation:   R Axis:     Text Interpretation:        Laboratory Studies: No results found for this or any previous visit (from the past 24 hour(s)). Imaging Studies: Dg Chest 2 View  Result Date: 02/21/2019 CLINICAL DATA:  Cough for 2 weeks. Chest pain and shortness of breath today. EXAM: CHEST - 2 VIEW COMPARISON:  01/22/2017 FINDINGS: The heart size and mediastinal contours are within normal limits. Both lungs are clear. The visualized skeletal structures are unremarkable. IMPRESSION: No active cardiopulmonary disease. Electronically Signed   By: Lucienne Capers M.D.   On: 02/21/2019 00:24    ED COURSE and MDM  Nursing notes and initial vitals signs, including pulse oximetry, reviewed.  Vitals:   02/20/19 2331  BP: (!) 158/96  Pulse: (!) 104  Resp: 18  Temp: 99.1 F (37.3 C)  TempSrc: Oral  SpO2: 97%  Weight: 106.6 kg  Height: 5\' 8"  (1.727 m)   1:45 AM Dyspnea improved after albuterol neb treatment.  We will give patient an inhaler and instruct him in its use.  We will also prescribe Advair.  He was advised he may take over-the-counter antidiarrheals as needed.  PROCEDURES    ED DIAGNOSES     ICD-10-CM   1. Acute bronchitis with bronchospasm J20.9        Roel Douthat, MD 02/21/19 [phone]

## 2019-02-24 ENCOUNTER — Emergency Department (HOSPITAL_COMMUNITY)
Admission: EM | Admit: 2019-02-24 | Discharge: 2019-02-24 | Disposition: A | Discharge status: Home / Self Care | Payer: BLUE CROSS/BLUE SHIELD | Attending: Emergency Medicine | Admitting: Emergency Medicine

## 2019-02-24 ENCOUNTER — Other Ambulatory Visit: Payer: Self-pay

## 2019-02-24 ENCOUNTER — Encounter (HOSPITAL_COMMUNITY): Payer: Self-pay

## 2019-02-24 ENCOUNTER — Emergency Department (HOSPITAL_COMMUNITY): Payer: BLUE CROSS/BLUE SHIELD

## 2019-02-24 DIAGNOSIS — J069 Acute upper respiratory infection, unspecified: Secondary | ICD-10-CM | POA: Diagnosis not present

## 2019-02-24 DIAGNOSIS — R739 Hyperglycemia, unspecified: Secondary | ICD-10-CM

## 2019-02-24 DIAGNOSIS — R05 Cough: Secondary | ICD-10-CM | POA: Diagnosis present

## 2019-02-24 DIAGNOSIS — F17228 Nicotine dependence, chewing tobacco, with other nicotine-induced disorders: Secondary | ICD-10-CM | POA: Insufficient documentation

## 2019-02-24 DIAGNOSIS — B9789 Other viral agents as the cause of diseases classified elsewhere: Secondary | ICD-10-CM | POA: Insufficient documentation

## 2019-02-24 DIAGNOSIS — Z8673 Personal history of transient ischemic attack (TIA), and cerebral infarction without residual deficits: Secondary | ICD-10-CM | POA: Diagnosis not present

## 2019-02-24 DIAGNOSIS — E1165 Type 2 diabetes mellitus with hyperglycemia: Secondary | ICD-10-CM | POA: Diagnosis not present

## 2019-02-24 DIAGNOSIS — R070 Pain in throat: Secondary | ICD-10-CM | POA: Insufficient documentation

## 2019-02-24 DIAGNOSIS — Z7984 Long term (current) use of oral hypoglycemic drugs: Secondary | ICD-10-CM | POA: Diagnosis not present

## 2019-02-24 DIAGNOSIS — R0981 Nasal congestion: Secondary | ICD-10-CM | POA: Insufficient documentation

## 2019-02-24 LAB — CBG MONITORING, ED: Glucose-Capillary: 401 mg/dL — ABNORMAL HIGH (ref 70–99)

## 2019-02-24 LAB — CBC WITH DIFFERENTIAL/PLATELET
Abs Immature Granulocytes: 0.02 10*3/uL (ref 0.00–0.07)
BASOS ABS: 0 10*3/uL (ref 0.0–0.1)
Basophils Relative: 0 %
Eosinophils Absolute: 0 10*3/uL (ref 0.0–0.5)
Eosinophils Relative: 0 %
HCT: 49.9 % (ref 39.0–52.0)
Hemoglobin: 15.5 g/dL (ref 13.0–17.0)
Immature Granulocytes: 0 %
Lymphocytes Relative: 13 %
Lymphs Abs: 0.8 10*3/uL (ref 0.7–4.0)
MCH: 27.8 pg (ref 26.0–34.0)
MCHC: 31.1 g/dL (ref 30.0–36.0)
MCV: 89.4 fL (ref 80.0–100.0)
Monocytes Absolute: 0.5 10*3/uL (ref 0.1–1.0)
Monocytes Relative: 8 %
Neutro Abs: 4.5 10*3/uL (ref 1.7–7.7)
Neutrophils Relative %: 79 %
Platelets: 203 10*3/uL (ref 150–400)
RBC: 5.58 MIL/uL (ref 4.22–5.81)
RDW: 13.5 % (ref 11.5–15.5)
WBC: 5.9 10*3/uL (ref 4.0–10.5)
nRBC: 0 % (ref 0.0–0.2)

## 2019-02-24 LAB — COMPREHENSIVE METABOLIC PANEL
ALT: 28 U/L (ref 0–44)
AST: 22 U/L (ref 15–41)
Albumin: 4 g/dL (ref 3.5–5.0)
Alkaline Phosphatase: 89 U/L (ref 38–126)
Anion gap: 15 (ref 5–15)
BUN: 25 mg/dL — ABNORMAL HIGH (ref 6–20)
CALCIUM: 8.6 mg/dL — AB (ref 8.9–10.3)
CHLORIDE: 96 mmol/L — AB (ref 98–111)
CO2: 21 mmol/L — AB (ref 22–32)
Creatinine, Ser: 0.94 mg/dL (ref 0.61–1.24)
GFR calc Af Amer: >60 mL/min (ref >60)
GFR calc non Af Amer: >60 mL/min (ref >60)
Glucose, Bld: 429 mg/dL — ABNORMAL HIGH (ref 70–99)
Potassium: 4.4 mmol/L (ref 3.5–5.1)
Sodium: 132 mmol/L — ABNORMAL LOW (ref 135–145)
Total Bilirubin: 0.8 mg/dL (ref 0.3–1.2)
Total Protein: 7.2 g/dL (ref 6.5–8.1)

## 2019-02-24 MED ORDER — SODIUM CHLORIDE 0.9 % IV BOLUS: 1000.0000 mL | Freq: Once | INTRAVENOUS | Status: DC

## 2019-02-24 MED ORDER — ACETAMINOPHEN 325 MG PO TABS
650.0000 mg | ORAL_TABLET | Freq: Once | ORAL | Status: AC
  Administered 2019-02-24: 650 mg via ORAL
  Filled 2019-02-24: qty 2

## 2019-02-24 MED ORDER — HYDROCOD POLST-CPM POLST ER 10-8 MG/5ML PO SUER: 2.5000 mL | Freq: Two times a day (BID) | ORAL | 0 refills | Status: DC | PRN

## 2019-02-24 MED ORDER — LIDOCAINE VISCOUS HCL 2 % MT SOLN
15.0000 mL | Freq: Once | OROMUCOSAL | Status: AC
  Administered 2019-02-24: 15 mL via OROMUCOSAL
  Filled 2019-02-24: qty 15

## 2019-02-24 MED ORDER — METFORMIN HCL 500 MG PO TABS: 500.0000 mg | ORAL_TABLET | Freq: Every day | ORAL | 0 refills | Status: DC

## 2019-02-24 MED ORDER — ALUM & MAG HYDROXIDE-SIMETH 200-200-20 MG/5ML PO SUSP
15.0000 mL | Freq: Once | ORAL | Status: AC
  Administered 2019-02-24: 15 mL via ORAL
  Filled 2019-02-24: qty 30

## 2019-02-24 NOTE — ED Notes (Signed)
Patient given discharge teaching and verbalized understanding. Patient ambulated out of ED with a steady gait. 

## 2019-02-24 NOTE — ED Provider Notes (Signed)
[facility] DEPT Provider Note   CSN: [id number] Arrival date & time: 02/24/19  1108    History   Chief Complaint Chief Complaint  Patient presents with  . Cough    HPI Jerry Shaffer is a 58 y.o. male.     The history is provided by the patient and medical records. No language interpreter was used.  Cough   Jerry Shaffer is a 58 y.o. male who presents to the Emergency Department complaining of cough.  He got sick starting two weeks ago with a head cold.  He has head congestion, significant mucous production, sneezing, mild cough.  He had a few days of diarrhea - now resolved.  He complains of persistent nasal congestion, sore throat, cough.  He is experiencing fevers to 101.4 started a few days ago.  He has associated sob.  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  He has a pediatric coronavirus contact (not [phone]).  He is a type II diabetic - not on any meds.  He was seen in the ED four days ago and prescribed doxycycline with no improvement in sxs.   Past Medical History:  Diagnosis Date  . Bipolar 1 disorder ([place])   . Diabetes mellitus   . Dyslipidemia   . Emphysema   . Mental disorder    BIPOLAR DISORDER  . Migraine   . PONV (postoperative nausea and vomiting)   . Shortness of breath   . Stroke ([place])   . Transient ischemic attack (TIA)     Patient Active Problem List   Diagnosis Date Noted  . Cellulitis of hand, left 05/03/2013  . Diverticulitis 07/26/2012  . Left facial numbness 07/26/2012  . Headache(784.0) 07/26/2012  . DM2 (diabetes mellitus, type 2) ([place]) 07/26/2012  . TIA (transient ischemic attack) 04/19/2012  . ATTENTION DEFICIT HYPERACTIVITY DISORDER, ADULT 07/25/2010  . GERD 07/25/2010  . DIVERTICULOSIS OF COLON 07/25/2010  . ABDOMINAL PAIN, LEFT LOWER QUADRANT 07/25/2010  . DIABETES MELLITUS, CONTROLLED 12/21/2009  . MIGRAINE HEADACHE 12/21/2009  . MICROSCOPIC  HEMATURIA 12/21/2009  . KNEE PAIN, LEFT 12/21/2009  . CHEST PAIN, INTERMITTENT 12/21/2009  . HYPERTENSION NEC 12/21/2009    Past Surgical History:  Procedure Laterality Date  . HEMORROIDECTOMY    . KNEE ARTHROSCOPY    . LUMBAR DISC SURGERY    . TONSILLECTOMY AND ADENOIDECTOMY          Home Medications    Prior to Admission medications   Medication Sig Start Date End Date Taking? Authorizing Provider  acetaminophen (TYLENOL) 500 MG tablet Take 1,000 mg by mouth every 6 (six) hours as needed for mild pain.   Yes [provider]  doxycycline (VIBRAMYCIN) 100 MG capsule Take 1 capsule (100 mg total) by mouth 2 (two) times daily. One po bid x 7 days 02/21/19  Yes Molpus, John, MD  Fluticasone-Salmeterol (ADVAIR DISKUS) 250-50 MCG/DOSE AEPB Take 1 puff twice daily.  Use regularly, not a rescue inhaler. Patient taking differently: Inhale 1 puff into the lungs 2 (two) times daily as needed (congestion). Take 1 puff twice daily.  Use regularly, not a rescue inhaler. 02/21/19  Yes Molpus, John, MD  guaiFENesin (MUCINEX) 600 MG 12 hr tablet Take 600 mg by mouth 2 (two) times daily as needed for cough or to loosen phlegm.   Yes [provider]  ibuprofen (ADVIL,MOTRIN) 200 MG tablet Take 600 mg by mouth every 6 (six) hours as needed for  moderate pain.   Yes [provider]  lidocaine (LIDODERM) 5 % Place 1 patch onto the skin daily. Remove & Discard patch within 12 hours or as directed by MD 06/09/18  Yes Palumbo, April, MD  naproxen (NAPROSYN) 375 MG tablet Take 1 tablet twice daily as needed for chest wall pain. 02/21/19  Yes Molpus, John, MD  OVER THE COUNTER MEDICATION Take 1 tablet by mouth daily. 'Pack of vitamins'   Yes [provider]  chlorpheniramine-HYDROcodone (TUSSIONEX PENNKINETIC ER) 10-8 MG/5ML SUER Take 2.5 mLs by mouth every 12 (twelve) hours as needed for cough. 02/24/19   Quintella Reichert, MD  metFORMIN (GLUCOPHAGE) 500 MG tablet Take 1 tablet (500 mg  total) by mouth daily with breakfast. 02/24/19   Quintella Reichert, MD  ofloxacin (OCUFLOX) 0.3 % ophthalmic solution Place 2 drops into the right eye 4 (four) times daily. Patient not taking: Reported on 02/24/2019 11/19/18   Volanda Napoleon, PA-C    Family History Family History  Problem Relation Age of Onset  . Diabetes type II Other   . Coronary artery disease Other   . Bipolar disorder Other   . Lung cancer Father     Social History Social History   Tobacco Use  . Smoking status: Former Smoker    Years: 30.00    Types: Cigarettes  . Smokeless tobacco: Current User  Substance Use Topics  . Alcohol use: No  . Drug use: No     Allergies   Bee venom; Penicillins; and Procaine   Review of Systems Review of Systems  Respiratory: Positive for cough.   All other systems reviewed and are negative.    Physical Exam Updated Vital Signs BP (!) 145/111 (BP Location: Right Arm)   Pulse 99   Temp 99.5 F (37.5 C) (Oral)   Resp 20   SpO2 98%   Physical Exam Vitals signs and nursing note reviewed.  Constitutional:      Appearance: He is well-developed.  HENT:     Head: Normocephalic and atraumatic.     Nose: Rhinorrhea present.     Comments: Erythema in posterior OP without edema or exudates Cardiovascular:     Rate and Rhythm: Regular rhythm.     Heart sounds: No murmur.     Comments: tachycardic Pulmonary:     Effort: Pulmonary effort is normal. No respiratory distress.     Breath sounds: Normal breath sounds.  Abdominal:     Palpations: Abdomen is soft.     Tenderness: There is no abdominal tenderness. There is no guarding or rebound.  Musculoskeletal:        General: No swelling or tenderness.  Skin:    General: Skin is warm and dry.  Neurological:     Mental Status: He is alert and oriented to person, place, and time.  Psychiatric:        Behavior: Behavior normal.      ED Treatments / Results  Labs (all labs ordered are listed, but only abnormal  results are displayed) Labs Reviewed  COMPREHENSIVE METABOLIC PANEL - Abnormal; Notable for the following components:      Result Value   Sodium 132 (*)    Chloride 96 (*)    CO2 21 (*)    Glucose, Bld 429 (*)    BUN 25 (*)    Calcium 8.6 (*)    All other components within normal limits  CBG MONITORING, ED - Abnormal; Notable for the following components:   Glucose-Capillary 401 (*)  All other components within normal limits  CBC WITH DIFFERENTIAL/PLATELET    EKG None  Radiology Dg Chest 2 View  Result Date: 02/24/2019 CLINICAL DATA:  Cough and fever. EXAM: CHEST - 2 VIEW COMPARISON:  02/20/2019 and prior radiographs FINDINGS: The cardiomediastinal silhouette is unremarkable. There is no evidence of focal airspace disease, pulmonary edema, suspicious pulmonary nodule/mass, pleural effusion, or pneumothorax. No acute bony abnormalities are identified. IMPRESSION: No active cardiopulmonary disease. Electronically Signed   By: Margarette Canada M.D.   On: 02/24/2019 14:05    Procedures Procedures (including critical care time)  Medications Ordered in ED Medications  sodium chloride 0.9 % bolus 1,000 mL (0 mLs Intravenous Stopped 02/24/19 1332)  sodium chloride 0.9 % bolus 1,000 mL (0 mLs Intravenous Stopped 02/24/19 1538)  alum & mag hydroxide-simeth (MAALOX/MYLANTA) 200-200-20 MG/5ML suspension 15 mL (15 mLs Oral Given 02/24/19 1332)  lidocaine (XYLOCAINE) 2 % viscous mouth solution 15 mL (15 mLs Mouth/Throat Given 02/24/19 1332)  acetaminophen (TYLENOL) tablet 650 mg (650 mg Oral Given 02/24/19 1332)     Initial Impression / Assessment and Plan / ED Course  I have reviewed the triage vital signs and the nursing notes.  Pertinent labs & imaging results that were available during my care of the patient were reviewed by me and considered in my medical decision making (see chart for details).        Patient with history of diabetes here for evaluation of fever, cough. He is  tachycardic on ED arrival but non-toxic appearing. No evidence of thrush, RPA, PTA, pneumonia. Labs are significant for hyperglycemia. Presentation is not consistent with DKA. Discussed with patient viral URI, hyperglycemia. Discussed outpatient follow-up as well as return precautions.  Final Clinical Impressions(s) / ED Diagnoses   Final diagnoses:  Viral URI with cough  Hyperglycemia    ED Discharge Orders         Ordered    metFORMIN (GLUCOPHAGE) 500 MG tablet  Daily with breakfast     02/24/19 1500    chlorpheniramine-HYDROcodone (TUSSIONEX PENNKINETIC ER) 10-8 MG/5ML SUER  Every 12 hours PRN     02/24/19 1500           Quintella Reichert, MD 02/24/19 1544

## 2019-02-24 NOTE — ED Triage Notes (Signed)
Pt states that he was seen on Sunday. Pt states that he was given doxycyline, and it has not helped his cough. Pt states that he has a productive cough, that has been getting thicker.

## 2019-02-24 NOTE — ED Notes (Signed)
Instructed by ED provider to wait for patients IV fluids to finish before patient is discharged.

## 2019-12-08 IMAGING — CR DG CHEST 2V
2 series · 2 of 2 positions shown · non-contrast
Comparison: 01/22/2017

CLINICAL DATA: Cough for 2 weeks. Chest pain and shortness of
breath today.

EXAM:
CHEST - 2 VIEW

[w chest pa]
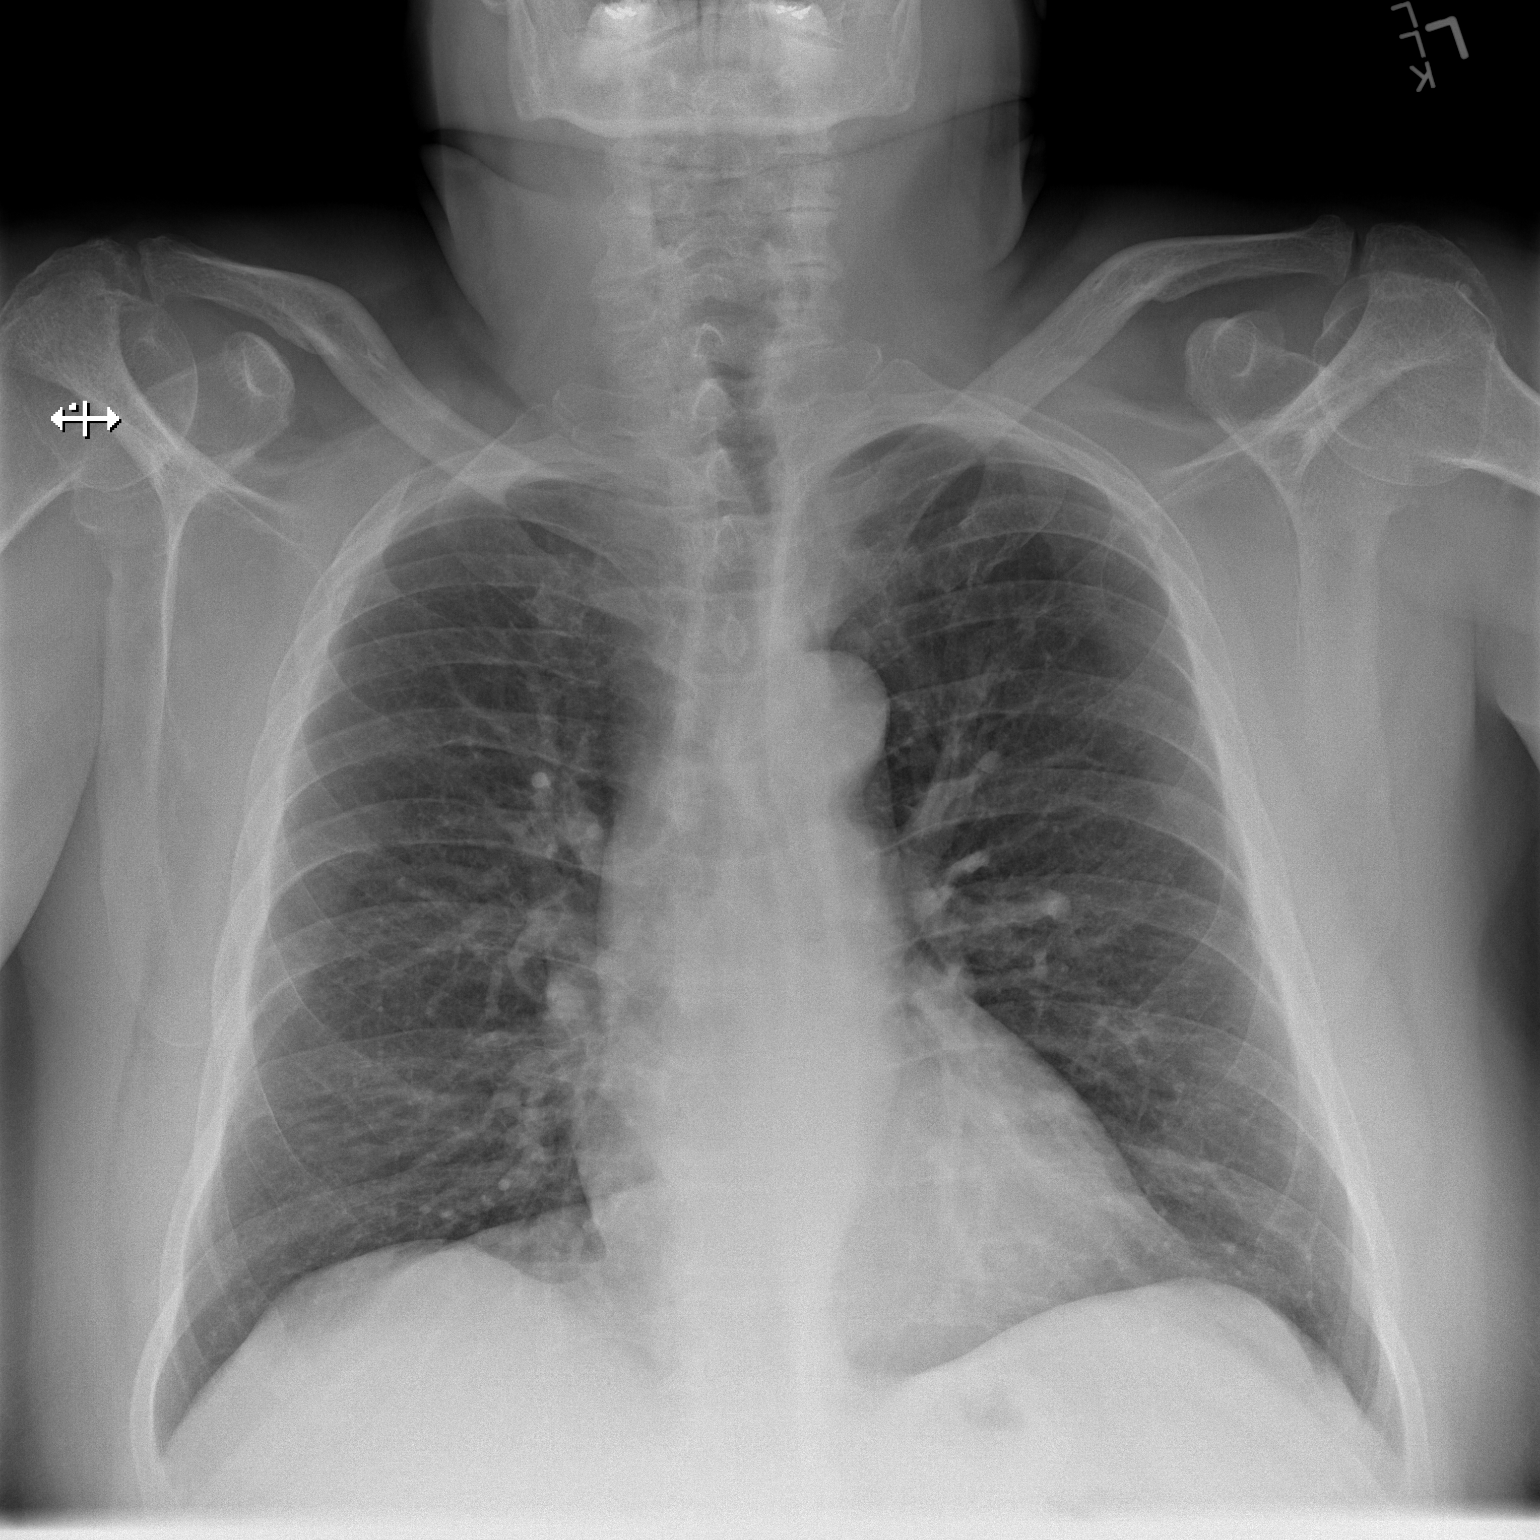

[w chest lat]
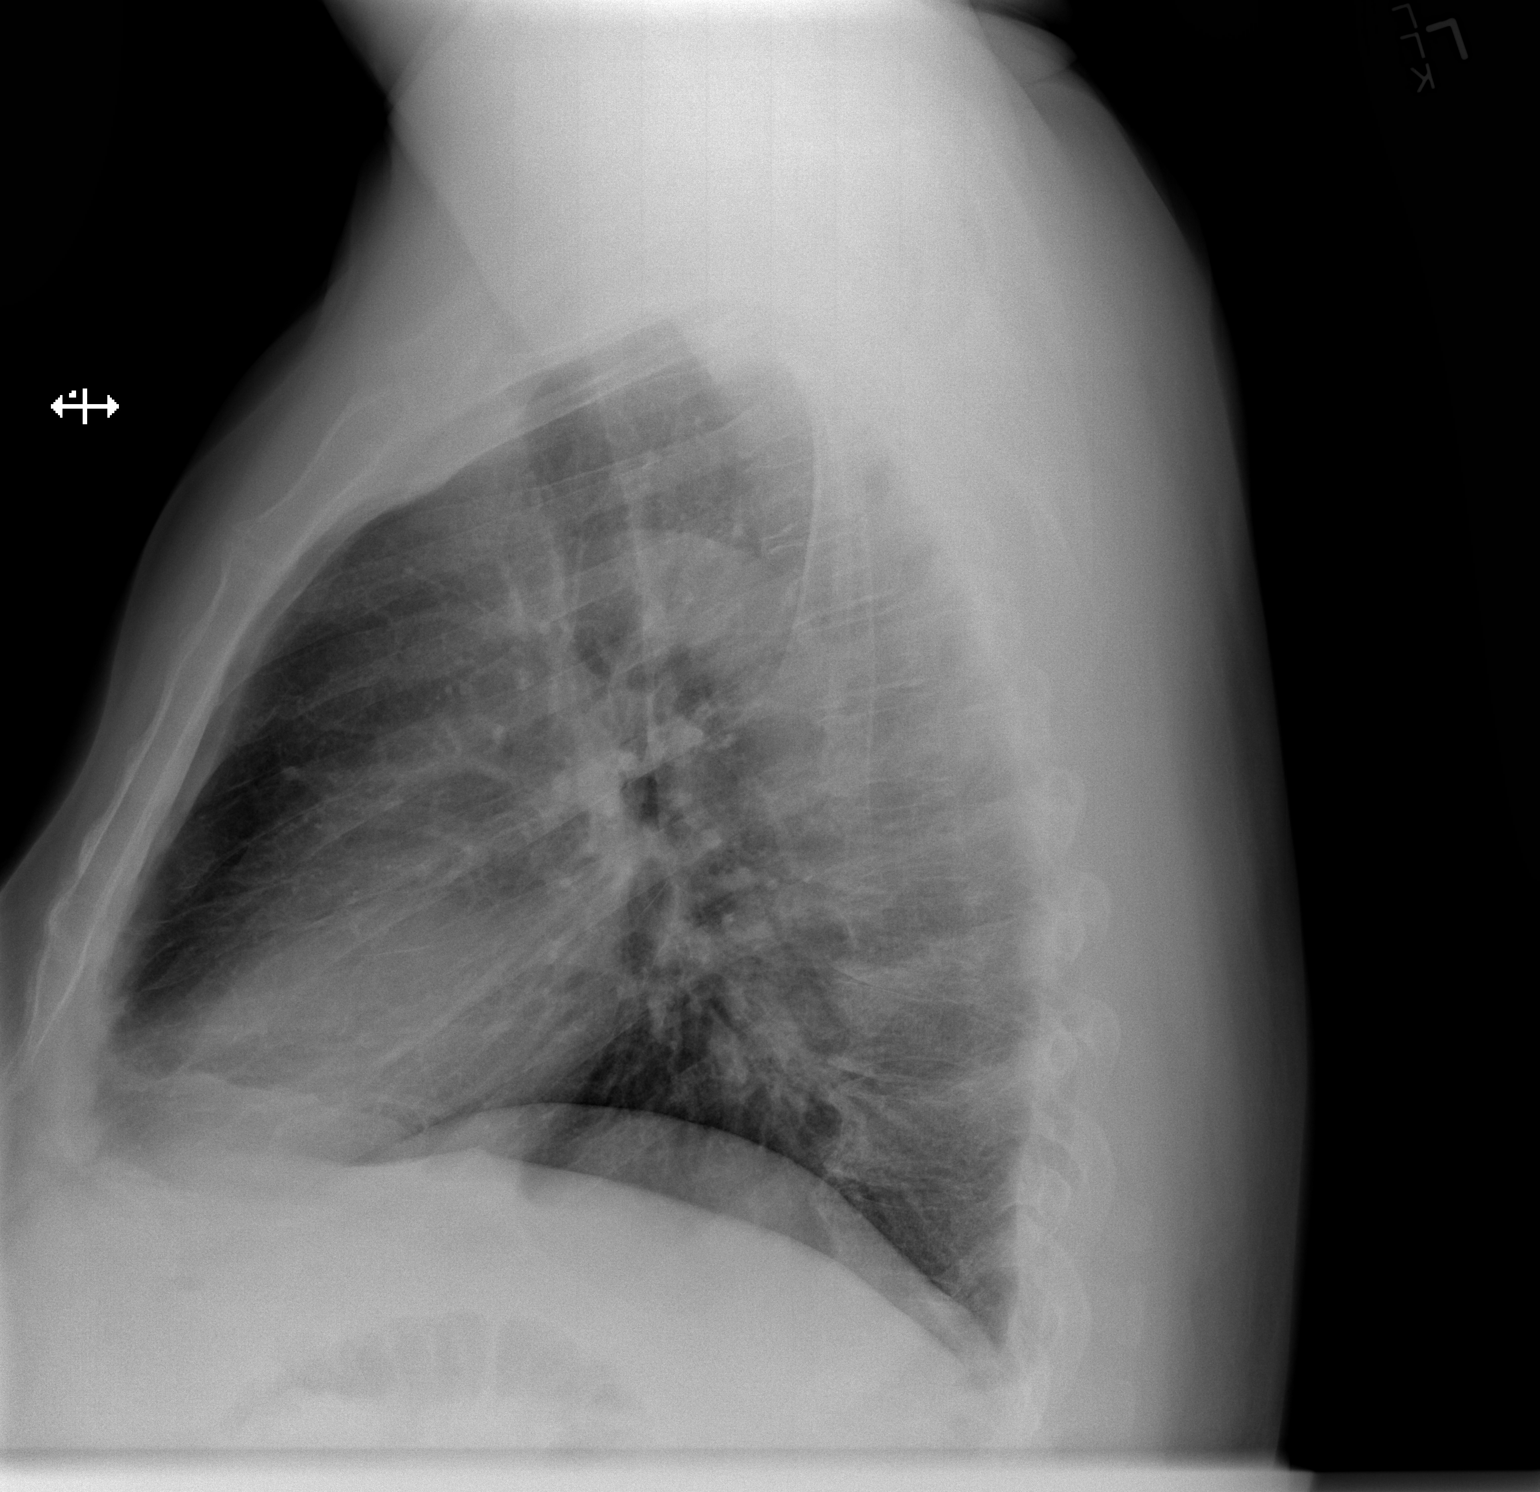

[2 of 2 positions shown; findings below may reference images not displayed]

FINDINGS: The heart size and mediastinal contours are within normal limits.
Both lungs are clear. The visualized skeletal structures are
unremarkable.
IMPRESSION: No active cardiopulmonary disease.

## 2020-02-20 ENCOUNTER — Emergency Department (HOSPITAL_COMMUNITY)
Admission: EM | Admit: 2020-02-20 | Discharge: 2020-02-20 | Disposition: A | Discharge status: Home / Self Care | Payer: BLUE CROSS/BLUE SHIELD | Attending: Emergency Medicine | Admitting: Emergency Medicine

## 2020-02-20 ENCOUNTER — Encounter (HOSPITAL_COMMUNITY): Payer: Self-pay | Admitting: Emergency Medicine

## 2020-02-20 ENCOUNTER — Other Ambulatory Visit: Payer: Self-pay

## 2020-02-20 DIAGNOSIS — Z79899 Other long term (current) drug therapy: Secondary | ICD-10-CM | POA: Insufficient documentation

## 2020-02-20 DIAGNOSIS — Y929 Unspecified place or not applicable: Secondary | ICD-10-CM | POA: Diagnosis not present

## 2020-02-20 DIAGNOSIS — X58XXXA Exposure to other specified factors, initial encounter: Secondary | ICD-10-CM | POA: Diagnosis not present

## 2020-02-20 DIAGNOSIS — T161XXA Foreign body in right ear, initial encounter: Secondary | ICD-10-CM | POA: Diagnosis not present

## 2020-02-20 DIAGNOSIS — Y999 Unspecified external cause status: Secondary | ICD-10-CM | POA: Insufficient documentation

## 2020-02-20 DIAGNOSIS — E119 Type 2 diabetes mellitus without complications: Secondary | ICD-10-CM | POA: Insufficient documentation

## 2020-02-20 DIAGNOSIS — Z7984 Long term (current) use of oral hypoglycemic drugs: Secondary | ICD-10-CM | POA: Diagnosis not present

## 2020-02-20 DIAGNOSIS — Y939 Activity, unspecified: Secondary | ICD-10-CM | POA: Diagnosis not present

## 2020-02-20 DIAGNOSIS — Z87891 Personal history of nicotine dependence: Secondary | ICD-10-CM | POA: Insufficient documentation

## 2020-02-20 NOTE — Discharge Instructions (Signed)
Follow up with your doctor.  Your blood pressure was slightly elevated.  Let them reassess this in the office

## 2020-02-20 NOTE — ED Triage Notes (Signed)
Ear piece from bluetooth came out in right ear.

## 2020-02-20 NOTE — ED Provider Notes (Signed)
[place] DEPT Provider Note   CSN: [id number] Arrival date & time: 02/20/20  1453     History Chief Complaint  Patient presents with  . Foreign Body in [place] is a 59 y.o. male.  59 yo M with a chief complaints of a foreign body in his right ear.  The patient had pulled out his Bluetooth device and realized that a piece was retained.  He tried to rinse it out without improvement and then came to the ED for evaluation.  Was removed prior to my evaluation.  Currently has no other feeling of foreign body sensation.  Denies any foreign body.  The history is provided by the patient.  Foreign Body in Ear This is a new problem. The current episode started less than 1 hour ago. The problem occurs constantly. The problem has not changed since onset.Nothing aggravates the symptoms. Nothing relieves the symptoms. He has tried nothing for the symptoms. The treatment provided no relief.       Past Medical History:  Diagnosis Date  . Bipolar 1 disorder ([place])   . Diabetes mellitus   . Dyslipidemia   . Emphysema   . Mental disorder    BIPOLAR DISORDER  . Migraine   . PONV (postoperative nausea and vomiting)   . Shortness of breath   . Stroke ([place])   . Transient ischemic attack (TIA)     Patient Active Problem List   Diagnosis Date Noted  . Cellulitis of hand, left 05/03/2013  . Diverticulitis 07/26/2012  . Left facial numbness 07/26/2012  . Headache(784.0) 07/26/2012  . DM2 (diabetes mellitus, type 2) ([place]) 07/26/2012  . TIA (transient ischemic attack) 04/19/2012  . ATTENTION DEFICIT HYPERACTIVITY DISORDER, ADULT 07/25/2010  . GERD 07/25/2010  . DIVERTICULOSIS OF COLON 07/25/2010  . ABDOMINAL PAIN, LEFT LOWER QUADRANT 07/25/2010  . DIABETES MELLITUS, CONTROLLED 12/21/2009  . MIGRAINE HEADACHE 12/21/2009  . MICROSCOPIC HEMATURIA 12/21/2009  . KNEE PAIN, LEFT 12/21/2009  . CHEST PAIN, INTERMITTENT 12/21/2009  . HYPERTENSION NEC  12/21/2009    Past Surgical History:  Procedure Laterality Date  . HEMORROIDECTOMY    . KNEE ARTHROSCOPY    . LUMBAR DISC SURGERY    . TONSILLECTOMY AND ADENOIDECTOMY         Family History  Problem Relation Age of Onset  . Diabetes type II Other   . Coronary artery disease Other   . Bipolar disorder Other   . Lung cancer Father     Social History   Tobacco Use  . Smoking status: Former Smoker    Years: 30.00    Types: Cigarettes  . Smokeless tobacco: Current User  Substance Use Topics  . Alcohol use: No  . Drug use: No    Home Medications Prior to Admission medications   Medication Sig Start Date End Date Taking? Authorizing Provider  acetaminophen (TYLENOL) 500 MG tablet Take 1,000 mg by mouth every 6 (six) hours as needed for mild pain.    [provider]  chlorpheniramine-HYDROcodone (TUSSIONEX PENNKINETIC ER) 10-8 MG/5ML SUER Take 2.5 mLs by mouth every 12 (twelve) hours as needed for cough. 02/24/19   Quintella Reichert, MD  doxycycline (VIBRAMYCIN) 100 MG capsule Take 1 capsule (100 mg total) by mouth 2 (two) times daily. One po bid x 7 days 02/21/19   Molpus, John, MD  Fluticasone-Salmeterol (ADVAIR DISKUS) 250-50 MCG/DOSE AEPB Take 1 puff twice daily.  Use regularly, not a rescue inhaler. Patient taking differently: Inhale  1 puff into the lungs 2 (two) times daily as needed (congestion). Take 1 puff twice daily.  Use regularly, not a rescue inhaler. 02/21/19   Molpus, John, MD  guaiFENesin (MUCINEX) 600 MG 12 hr tablet Take 600 mg by mouth 2 (two) times daily as needed for cough or to loosen phlegm.    [provider]  ibuprofen (ADVIL,MOTRIN) 200 MG tablet Take 600 mg by mouth every 6 (six) hours as needed for moderate pain.    [provider]  lidocaine (LIDODERM) 5 % Place 1 patch onto the skin daily. Remove & Discard patch within 12 hours or as directed by MD 06/09/18   Randal Buba, April, MD  metFORMIN (GLUCOPHAGE) 500 MG tablet Take 1 tablet  (500 mg total) by mouth daily with breakfast. 02/24/19   Quintella Reichert, MD  naproxen (NAPROSYN) 375 MG tablet Take 1 tablet twice daily as needed for chest wall pain. 02/21/19   Molpus, John, MD  ofloxacin (OCUFLOX) 0.3 % ophthalmic solution Place 2 drops into the right eye 4 (four) times daily. Patient not taking: Reported on 02/24/2019 11/19/18   Providence Lanius A, PA-C  OVER THE COUNTER MEDICATION Take 1 tablet by mouth daily. 'Pack of vitamins'    [provider]    Allergies    Bee venom, Penicillins, and Procaine  Review of Systems   Review of Systems  HENT: Positive for ear pain.     Physical Exam Updated Vital Signs BP (!) 161/100   Pulse (!) 101   Temp 98 F (36.7 C) (Oral)   Resp 18   SpO2 100%   Physical Exam Vitals and nursing note reviewed.  Constitutional:      Appearance: He is well-developed.  HENT:     Head: Normocephalic and atraumatic.     Ears:     Comments: Left TM is obscured by wax.  Right TM is normal. Eyes:     Pupils: Pupils are equal, round, and reactive to light.  Neck:     Vascular: No JVD.  Cardiovascular:     Heart sounds: No murmur. No friction rub. No gallop.   Pulmonary:     Effort: Pulmonary effort is normal. No respiratory distress.  Abdominal:     General: There is no distension.  Musculoskeletal:        General: Normal range of motion.     Cervical back: Normal range of motion and neck supple.  Neurological:     Mental Status: He is alert and oriented to person, place, and time.  Psychiatric:        Behavior: Behavior normal.     ED Results / Procedures / Treatments   Labs (all labs ordered are listed, but only abnormal results are displayed) Labs Reviewed - No data to display  EKG None  Radiology No results found.  Procedures Procedures (including critical care time)  Medications Ordered in ED Medications - No data to display  ED Course  I have reviewed the triage vital signs and the nursing  notes.  Pertinent labs & imaging results that were available during my care of the patient were reviewed by me and considered in my medical decision making (see chart for details).    MDM Rules/Calculators/A&P                      59 yo M with a chief years.  This is removed by nursing.  No noted TM involvement. No other FB.  D/c home.  3:12 PM:  I have discussed the diagnosis/risks/treatment options with the patient and believe the pt to be eligible for discharge home to follow-up with PCP. We also discussed returning to the ED immediately if new or worsening sx occur. We discussed the sx which are most concerning (e.g., sudden worsening pain, fever, inability to tolerate by mouth) that necessitate immediate return. Medications administered to the patient during their visit and any new prescriptions provided to the patient are listed below.  Medications given during this visit Medications - No data to display   The patient appears reasonably screen and/or stabilized for discharge and I doubt any other medical condition or other [facility] requiring further screening, evaluation, or treatment in the ED at this time prior to discharge.   Final Clinical Impression(s) / ED Diagnoses Final diagnoses:  Foreign body of right ear, initial encounter    Rx / DC Orders ED Discharge Orders    None       Deno Etienne, DO 02/20/20 1512

## 2020-07-04 ENCOUNTER — Encounter (HOSPITAL_COMMUNITY): Payer: Self-pay | Admitting: Emergency Medicine

## 2020-07-04 ENCOUNTER — Emergency Department (HOSPITAL_COMMUNITY): Payer: BLUE CROSS/BLUE SHIELD

## 2020-07-04 ENCOUNTER — Emergency Department (HOSPITAL_COMMUNITY)
Admission: EM | Admit: 2020-07-04 | Discharge: 2020-07-04 | Disposition: A | Discharge status: Home / Self Care | Payer: BLUE CROSS/BLUE SHIELD | Attending: Emergency Medicine | Admitting: Emergency Medicine

## 2020-07-04 DIAGNOSIS — Z7984 Long term (current) use of oral hypoglycemic drugs: Secondary | ICD-10-CM | POA: Insufficient documentation

## 2020-07-04 DIAGNOSIS — E119 Type 2 diabetes mellitus without complications: Secondary | ICD-10-CM | POA: Insufficient documentation

## 2020-07-04 DIAGNOSIS — R109 Unspecified abdominal pain: Secondary | ICD-10-CM | POA: Diagnosis not present

## 2020-07-04 DIAGNOSIS — I700 Atherosclerosis of aorta: Secondary | ICD-10-CM | POA: Diagnosis not present

## 2020-07-04 DIAGNOSIS — I10 Essential (primary) hypertension: Secondary | ICD-10-CM | POA: Diagnosis not present

## 2020-07-04 DIAGNOSIS — Z87891 Personal history of nicotine dependence: Secondary | ICD-10-CM | POA: Diagnosis not present

## 2020-07-04 DIAGNOSIS — R1030 Lower abdominal pain, unspecified: Secondary | ICD-10-CM | POA: Diagnosis not present

## 2020-07-04 DIAGNOSIS — Z880 Allergy status to penicillin: Secondary | ICD-10-CM | POA: Insufficient documentation

## 2020-07-04 HISTORY — DX: Disorder of kidney and ureter, unspecified: N28.9

## 2020-07-04 LAB — URINALYSIS, ROUTINE W REFLEX MICROSCOPIC
Bacteria, UA: NONE SEEN
Bilirubin Urine: NEGATIVE
Glucose, UA: >=500 mg/dL — AB
Ketones, ur: 5 mg/dL — AB
Leukocytes,Ua: NEGATIVE
Nitrite: NEGATIVE
Protein, ur: NEGATIVE mg/dL
Specific Gravity, Urine: 1.022 (ref 1.005–1.030)
pH: 5 (ref 5.0–8.0)

## 2020-07-04 LAB — CBC
HCT: 44.9 % (ref 39.0–52.0)
Hemoglobin: 14.6 g/dL (ref 13.0–17.0)
MCH: 28.3 pg (ref 26.0–34.0)
MCHC: 32.5 g/dL (ref 30.0–36.0)
MCV: 87.2 fL (ref 80.0–100.0)
Platelets: 249 10*3/uL (ref 150–400)
RBC: 5.15 MIL/uL (ref 4.22–5.81)
RDW: 12.8 % (ref 11.5–15.5)
WBC: 7.9 10*3/uL (ref 4.0–10.5)
nRBC: 0 % (ref 0.0–0.2)

## 2020-07-04 LAB — BASIC METABOLIC PANEL
Anion gap: 10 (ref 5–15)
BUN: 15 mg/dL (ref 6–20)
CO2: 22 mmol/L (ref 22–32)
Calcium: 8.8 mg/dL — ABNORMAL LOW (ref 8.9–10.3)
Chloride: 103 mmol/L (ref 98–111)
Creatinine, Ser: 0.55 mg/dL — ABNORMAL LOW (ref 0.61–1.24)
GFR calc Af Amer: >60 mL/min (ref >60)
GFR calc non Af Amer: >60 mL/min (ref >60)
Glucose, Bld: 294 mg/dL — ABNORMAL HIGH (ref 70–99)
Potassium: 4 mmol/L (ref 3.5–5.1)
Sodium: 135 mmol/L (ref 135–145)

## 2020-07-04 MED ORDER — ONDANSETRON HCL 4 MG/2ML IJ SOLN
INTRAMUSCULAR | Status: AC
  Filled 2020-07-04: qty 2

## 2020-07-04 MED ORDER — KETOROLAC TROMETHAMINE 30 MG/ML IJ SOLN
30.0000 mg | Freq: Once | INTRAMUSCULAR | Status: AC
  Administered 2020-07-04: 30 mg via INTRAVENOUS
  Filled 2020-07-04: qty 1

## 2020-07-04 MED ORDER — ONDANSETRON HCL 4 MG/2ML IJ SOLN
4.0000 mg | Freq: Once | INTRAMUSCULAR | Status: AC
  Administered 2020-07-04: 4 mg via INTRAVENOUS
  Filled 2020-07-04: qty 2

## 2020-07-04 MED ORDER — CYCLOBENZAPRINE HCL 10 MG PO TABS
10.0000 mg | ORAL_TABLET | Freq: Three times a day (TID) | ORAL | 0 refills | Status: DC | PRN
Start: 2020-07-04 — End: 2020-09-21

## 2020-07-04 MED ORDER — NAPROXEN 500 MG PO TABS
500.0000 mg | ORAL_TABLET | Freq: Two times a day (BID) | ORAL | 0 refills | Status: DC
Start: 2020-07-04 — End: 2020-09-21

## 2020-07-04 NOTE — Discharge Instructions (Addendum)
Begin taking naproxen as prescribed.  Take Flexeril as prescribed as needed for pain not relieved with naproxen.  Follow-up with your primary doctor if symptoms or not improving in the next week, and return to the ER if you develop worsening pain, high fever, or other new and concerning symptoms.

## 2020-07-04 NOTE — ED Provider Notes (Signed)
[place] DEPT Provider Note   CSN: [id number] Arrival date & time: 07/04/20  0809     History Chief Complaint  Patient presents with  . Flank Pain    Jerry Shaffer is a 59 y.o. male.  Patient is a 59 year old male with past medical history of diabetes, bipolar, migraines, and prior renal calculi.  He presents today for evaluation of right flank pain.  This began several days ago and is worsening.  This began in the absence of any injury or trauma.  He denies any dysuria or hematuria.  He denies any fevers or chills.  He denies any bowel complaints.  The history is provided by the patient.  Flank Pain This is a new problem. Episode onset: Several days ago. The problem occurs constantly. The problem has been gradually worsening. Pertinent negatives include no abdominal pain. Nothing aggravates the symptoms. Nothing relieves the symptoms. He has tried nothing for the symptoms.       Past Medical History:  Diagnosis Date  . Bipolar 1 disorder ([place])   . Diabetes mellitus   . Dyslipidemia   . Emphysema   . Mental disorder    BIPOLAR DISORDER  . Migraine   . PONV (postoperative nausea and vomiting)   . Renal disorder   . Shortness of breath   . Stroke ([place])   . Transient ischemic attack (TIA)     Patient Active Problem List   Diagnosis Date Noted  . Cellulitis of hand, left 05/03/2013  . Diverticulitis 07/26/2012  . Left facial numbness 07/26/2012  . Headache(784.0) 07/26/2012  . DM2 (diabetes mellitus, type 2) ([place]) 07/26/2012  . TIA (transient ischemic attack) 04/19/2012  . ATTENTION DEFICIT HYPERACTIVITY DISORDER, ADULT 07/25/2010  . GERD 07/25/2010  . DIVERTICULOSIS OF COLON 07/25/2010  . ABDOMINAL PAIN, LEFT LOWER QUADRANT 07/25/2010  . DIABETES MELLITUS, CONTROLLED 12/21/2009  . MIGRAINE HEADACHE 12/21/2009  . MICROSCOPIC HEMATURIA 12/21/2009  . KNEE PAIN, LEFT 12/21/2009  . CHEST PAIN, INTERMITTENT 12/21/2009  . HYPERTENSION  NEC 12/21/2009    Past Surgical History:  Procedure Laterality Date  . HEMORROIDECTOMY    . KNEE ARTHROSCOPY    . LUMBAR DISC SURGERY    . TONSILLECTOMY AND ADENOIDECTOMY         Family History  Problem Relation Age of Onset  . Diabetes type II Other   . Coronary artery disease Other   . Bipolar disorder Other   . Lung cancer Father     Social History   Tobacco Use  . Smoking status: Former Smoker    Years: 30.00    Types: Cigarettes  . Smokeless tobacco: Current User  Substance Use Topics  . Alcohol use: No  . Drug use: No    Home Medications Prior to Admission medications   Medication Sig Start Date End Date Taking? Authorizing Provider  acetaminophen (TYLENOL) 500 MG tablet Take 1,000 mg by mouth every 6 (six) hours as needed for mild pain.    [provider]  chlorpheniramine-HYDROcodone (TUSSIONEX PENNKINETIC ER) 10-8 MG/5ML SUER Take 2.5 mLs by mouth every 12 (twelve) hours as needed for cough. 02/24/19   Quintella Reichert, MD  doxycycline (VIBRAMYCIN) 100 MG capsule Take 1 capsule (100 mg total) by mouth 2 (two) times daily. One po bid x 7 days 02/21/19   Molpus, John, MD  Fluticasone-Salmeterol (ADVAIR DISKUS) 250-50 MCG/DOSE AEPB Take 1 puff twice daily.  Use regularly, not a rescue inhaler. Patient taking differently: Inhale 1 puff into the lungs  2 (two) times daily as needed (congestion). Take 1 puff twice daily.  Use regularly, not a rescue inhaler. 02/21/19   Molpus, John, MD  guaiFENesin (MUCINEX) 600 MG 12 hr tablet Take 600 mg by mouth 2 (two) times daily as needed for cough or to loosen phlegm.    [provider]  ibuprofen (ADVIL,MOTRIN) 200 MG tablet Take 600 mg by mouth every 6 (six) hours as needed for moderate pain.    [provider]  lidocaine (LIDODERM) 5 % Place 1 patch onto the skin daily. Remove & Discard patch within 12 hours or as directed by MD 06/09/18   Randal Buba, April, MD  metFORMIN (GLUCOPHAGE) 500 MG tablet Take 1  tablet (500 mg total) by mouth daily with breakfast. 02/24/19   Quintella Reichert, MD  naproxen (NAPROSYN) 375 MG tablet Take 1 tablet twice daily as needed for chest wall pain. 02/21/19   Molpus, John, MD  ofloxacin (OCUFLOX) 0.3 % ophthalmic solution Place 2 drops into the right eye 4 (four) times daily. Patient not taking: Reported on 02/24/2019 11/19/18   Providence Lanius A, PA-C  OVER THE COUNTER MEDICATION Take 1 tablet by mouth daily. 'Pack of vitamins'    [provider]    Allergies    Bee venom, Penicillins, and Procaine  Review of Systems   Review of Systems  Gastrointestinal: Negative for abdominal pain.  Genitourinary: Positive for flank pain.  All other systems reviewed and are negative.   Physical Exam Updated Vital Signs BP (!) 164/102 (BP Location: Left Arm)   Pulse 92   Temp 98.2 F (36.8 C) (Oral)   Resp 17   SpO2 97%   Physical Exam Vitals and nursing note reviewed.  Constitutional:      General: He is not in acute distress.    Appearance: He is well-developed. He is not diaphoretic.  HENT:     Head: Normocephalic and atraumatic.  Cardiovascular:     Rate and Rhythm: Normal rate and regular rhythm.     Heart sounds: No murmur heard.  No friction rub.  Pulmonary:     Effort: Pulmonary effort is normal. No respiratory distress.     Breath sounds: Normal breath sounds. No wheezing or rales.  Abdominal:     General: Bowel sounds are normal. There is no distension.     Palpations: Abdomen is soft.     Tenderness: There is no abdominal tenderness. There is right CVA tenderness. There is no left CVA tenderness, guarding or rebound.  Musculoskeletal:        General: Normal range of motion.     Cervical back: Normal range of motion and neck supple.  Skin:    General: Skin is warm and dry.  Neurological:     Mental Status: He is alert and oriented to person, place, and time.     Coordination: Coordination normal.     ED Results / Procedures /  Treatments   Labs (all labs ordered are listed, but only abnormal results are displayed) Labs Reviewed  URINALYSIS, ROUTINE W REFLEX MICROSCOPIC - Abnormal; Notable for the following components:      Result Value   Color, Urine STRAW (*)    Glucose, UA >=500 (*)    Hgb urine dipstick SMALL (*)    Ketones, ur 5 (*)    All other components within normal limits  BASIC METABOLIC PANEL  CBC    EKG None  Radiology No results found.  Procedures Procedures (including critical care time)  Medications Ordered in ED Medications  ketorolac (TORADOL) 30 MG/ML injection 30 mg (has no administration in time range)  ondansetron (ZOFRAN) injection 4 mg (has no administration in time range)    ED Course  I have reviewed the triage vital signs and the nursing notes.  Pertinent labs & imaging results that were available during my care of the patient were reviewed by me and considered in my medical decision making (see chart for details).    MDM Rules/Calculators/A&P  Patient presenting here with complaints of right flank pain.  He has history of renal calculi and this feels similar.  Patient does have trace blood in his urine, however no sign of infection.  His CT scan is negative for obstructive uropathy.  There are also other no other causes of his pain identified on the CT scan.  I suspect this is musculoskeletal.  Patient will be discharged with anti-inflammatory medications, Flexeril, and as needed return.  Patient is also requested a refill of an expired EpiPen which I have agreed to do.  Final Clinical Impression(s) / ED Diagnoses Final diagnoses:  None    Rx / DC Orders ED Discharge Orders    None       Veryl Speak, MD 07/04/20 1245

## 2020-07-04 NOTE — ED Triage Notes (Signed)
Per pt, states  Right flank pain since 5 am this am-states he has a history of kidney stones-no N/V-states stones usually pass so he does not follow up with urology

## 2020-09-20 ENCOUNTER — Inpatient Hospital Stay (HOSPITAL_COMMUNITY): Payer: BLUE CROSS/BLUE SHIELD

## 2020-09-20 ENCOUNTER — Inpatient Hospital Stay (HOSPITAL_COMMUNITY)
Admission: EM | Admit: 2020-09-20 | Discharge: 2020-09-21 | DRG: 247 | Disposition: A | Discharge status: Home / Self Care | Payer: BLUE CROSS/BLUE SHIELD | Attending: Cardiovascular Disease | Admitting: Cardiovascular Disease

## 2020-09-20 ENCOUNTER — Encounter (HOSPITAL_COMMUNITY): Payer: Self-pay

## 2020-09-20 ENCOUNTER — Emergency Department (HOSPITAL_COMMUNITY): Payer: BLUE CROSS/BLUE SHIELD

## 2020-09-20 ENCOUNTER — Encounter (HOSPITAL_COMMUNITY)
Admission: EM | Disposition: A | Discharge status: Home / Self Care | Payer: Self-pay | Source: Home / Self Care | Attending: Cardiovascular Disease

## 2020-09-20 ENCOUNTER — Other Ambulatory Visit: Payer: Self-pay

## 2020-09-20 DIAGNOSIS — Z884 Allergy status to anesthetic agent status: Secondary | ICD-10-CM | POA: Diagnosis not present

## 2020-09-20 DIAGNOSIS — F909 Attention-deficit hyperactivity disorder, unspecified type: Secondary | ICD-10-CM | POA: Diagnosis present

## 2020-09-20 DIAGNOSIS — Z825 Family history of asthma and other chronic lower respiratory diseases: Secondary | ICD-10-CM

## 2020-09-20 DIAGNOSIS — I2510 Atherosclerotic heart disease of native coronary artery without angina pectoris: Secondary | ICD-10-CM | POA: Diagnosis not present

## 2020-09-20 DIAGNOSIS — I214 Non-ST elevation (NSTEMI) myocardial infarction: Secondary | ICD-10-CM | POA: Diagnosis not present

## 2020-09-20 DIAGNOSIS — R079 Chest pain, unspecified: Secondary | ICD-10-CM | POA: Diagnosis not present

## 2020-09-20 DIAGNOSIS — Z20822 Contact with and (suspected) exposure to covid-19: Secondary | ICD-10-CM | POA: Diagnosis not present

## 2020-09-20 DIAGNOSIS — E785 Hyperlipidemia, unspecified: Secondary | ICD-10-CM | POA: Diagnosis present

## 2020-09-20 DIAGNOSIS — E1159 Type 2 diabetes mellitus with other circulatory complications: Secondary | ICD-10-CM

## 2020-09-20 DIAGNOSIS — Z91030 Bee allergy status: Secondary | ICD-10-CM | POA: Diagnosis not present

## 2020-09-20 DIAGNOSIS — Z8673 Personal history of transient ischemic attack (TIA), and cerebral infarction without residual deficits: Secondary | ICD-10-CM

## 2020-09-20 DIAGNOSIS — Z87892 Personal history of anaphylaxis: Secondary | ICD-10-CM | POA: Diagnosis not present

## 2020-09-20 DIAGNOSIS — K5730 Diverticulosis of large intestine without perforation or abscess without bleeding: Secondary | ICD-10-CM | POA: Diagnosis present

## 2020-09-20 DIAGNOSIS — I10 Essential (primary) hypertension: Secondary | ICD-10-CM | POA: Diagnosis not present

## 2020-09-20 DIAGNOSIS — I25110 Atherosclerotic heart disease of native coronary artery with unstable angina pectoris: Secondary | ICD-10-CM | POA: Diagnosis not present

## 2020-09-20 DIAGNOSIS — Z7984 Long term (current) use of oral hypoglycemic drugs: Secondary | ICD-10-CM | POA: Diagnosis not present

## 2020-09-20 DIAGNOSIS — E119 Type 2 diabetes mellitus without complications: Secondary | ICD-10-CM

## 2020-09-20 DIAGNOSIS — Z8249 Family history of ischemic heart disease and other diseases of the circulatory system: Secondary | ICD-10-CM

## 2020-09-20 DIAGNOSIS — Z87891 Personal history of nicotine dependence: Secondary | ICD-10-CM | POA: Diagnosis not present

## 2020-09-20 DIAGNOSIS — Z833 Family history of diabetes mellitus: Secondary | ICD-10-CM

## 2020-09-20 DIAGNOSIS — Z6833 Body mass index (BMI) 33.0-33.9, adult: Secondary | ICD-10-CM | POA: Diagnosis not present

## 2020-09-20 DIAGNOSIS — Z880 Allergy status to penicillin: Secondary | ICD-10-CM

## 2020-09-20 DIAGNOSIS — G43909 Migraine, unspecified, not intractable, without status migrainosus: Secondary | ICD-10-CM | POA: Diagnosis not present

## 2020-09-20 DIAGNOSIS — Z79899 Other long term (current) drug therapy: Secondary | ICD-10-CM

## 2020-09-20 DIAGNOSIS — F319 Bipolar disorder, unspecified: Secondary | ICD-10-CM | POA: Diagnosis present

## 2020-09-20 DIAGNOSIS — J439 Emphysema, unspecified: Secondary | ICD-10-CM | POA: Diagnosis not present

## 2020-09-20 DIAGNOSIS — I2582 Chronic total occlusion of coronary artery: Secondary | ICD-10-CM | POA: Diagnosis not present

## 2020-09-20 DIAGNOSIS — Z006 Encounter for examination for normal comparison and control in clinical research program: Secondary | ICD-10-CM | POA: Diagnosis not present

## 2020-09-20 DIAGNOSIS — E669 Obesity, unspecified: Secondary | ICD-10-CM | POA: Diagnosis present

## 2020-09-20 DIAGNOSIS — Z955 Presence of coronary angioplasty implant and graft: Secondary | ICD-10-CM

## 2020-09-20 HISTORY — PX: CORONARY STENT INTERVENTION: CATH118234

## 2020-09-20 HISTORY — DX: Atherosclerotic heart disease of native coronary artery without angina pectoris: I25.10

## 2020-09-20 HISTORY — PX: CORONARY ANGIOGRAPHY: CATH118303

## 2020-09-20 HISTORY — PX: LEFT HEART CATH AND CORONARY ANGIOGRAPHY: CATH118249

## 2020-09-20 HISTORY — DX: Non-ST elevation (NSTEMI) myocardial infarction: I21.4

## 2020-09-20 LAB — BASIC METABOLIC PANEL
Anion gap: 10 (ref 5–15)
BUN: 18 mg/dL (ref 6–20)
CO2: 27 mmol/L (ref 22–32)
Calcium: 9.2 mg/dL (ref 8.9–10.3)
Chloride: 104 mmol/L (ref 98–111)
Creatinine, Ser: 0.84 mg/dL (ref 0.61–1.24)
GFR calc non Af Amer: >60 mL/min (ref >60)
Glucose, Bld: 114 mg/dL — ABNORMAL HIGH (ref 70–99)
Potassium: 4.4 mmol/L (ref 3.5–5.1)
Sodium: 141 mmol/L (ref 135–145)

## 2020-09-20 LAB — LIPID PANEL
Cholesterol: 161 mg/dL (ref 0–200)
HDL: 31 mg/dL — ABNORMAL LOW (ref >40)
LDL Cholesterol: 105 mg/dL — ABNORMAL HIGH (ref 0–99)
Total CHOL/HDL Ratio: 5.2 RATIO
Triglycerides: 124 mg/dL (ref <150)
VLDL: 25 mg/dL (ref 0–40)

## 2020-09-20 LAB — CREATININE, SERUM
Creatinine, Ser: 0.61 mg/dL (ref 0.61–1.24)
GFR calc non Af Amer: >60 mL/min (ref >60)

## 2020-09-20 LAB — CBC
HCT: 46.9 % (ref 39.0–52.0)
HCT: 42.9 % (ref 39.0–52.0)
Hemoglobin: 15.7 g/dL (ref 13.0–17.0)
Hemoglobin: 14 g/dL (ref 13.0–17.0)
MCH: 28.4 pg (ref 26.0–34.0)
MCH: 27.7 pg (ref 26.0–34.0)
MCHC: 33.5 g/dL (ref 30.0–36.0)
MCHC: 32.6 g/dL (ref 30.0–36.0)
MCV: 84.8 fL (ref 80.0–100.0)
MCV: 84.8 fL (ref 80.0–100.0)
Platelets: 265 10*3/uL (ref 150–400)
Platelets: 252 10*3/uL (ref 150–400)
RBC: 5.53 MIL/uL (ref 4.22–5.81)
RBC: 5.06 MIL/uL (ref 4.22–5.81)
RDW: 13 % (ref 11.5–15.5)
RDW: 12.9 % (ref 11.5–15.5)
WBC: 13.5 10*3/uL — ABNORMAL HIGH (ref 4.0–10.5)
WBC: 11.6 10*3/uL — ABNORMAL HIGH (ref 4.0–10.5)
nRBC: 0 % (ref 0.0–0.2)
nRBC: 0 % (ref 0.0–0.2)

## 2020-09-20 LAB — HEPATIC FUNCTION PANEL
ALT: 15 U/L (ref 0–44)
AST: 33 U/L (ref 15–41)
Albumin: 3.6 g/dL (ref 3.5–5.0)
Alkaline Phosphatase: 78 U/L (ref 38–126)
Bilirubin, Direct: 0.2 mg/dL (ref 0.0–0.2)
Indirect Bilirubin: 0.6 mg/dL (ref 0.3–0.9)
Total Bilirubin: 0.8 mg/dL (ref 0.3–1.2)
Total Protein: 6.1 g/dL — ABNORMAL LOW (ref 6.5–8.1)

## 2020-09-20 LAB — URINALYSIS, ROUTINE W REFLEX MICROSCOPIC
Bacteria, UA: NONE SEEN
Bilirubin Urine: NEGATIVE
Glucose, UA: >=500 mg/dL — AB
Hgb urine dipstick: NEGATIVE
Ketones, ur: 20 mg/dL — AB
Leukocytes,Ua: NEGATIVE
Nitrite: NEGATIVE
Protein, ur: NEGATIVE mg/dL
Specific Gravity, Urine: 1.025 (ref 1.005–1.030)
pH: 5 (ref 5.0–8.0)

## 2020-09-20 LAB — BRAIN NATRIURETIC PEPTIDE: B Natriuretic Peptide: 136.7 pg/mL — ABNORMAL HIGH (ref 0.0–100.0)

## 2020-09-20 LAB — D-DIMER, QUANTITATIVE: D-Dimer, Quant: 0.31 ug/mL-FEU (ref 0.00–0.50)

## 2020-09-20 LAB — CBG MONITORING, ED: Glucose-Capillary: 348 mg/dL — ABNORMAL HIGH (ref 70–99)

## 2020-09-20 LAB — TROPONIN I (HIGH SENSITIVITY)
Troponin I (High Sensitivity): 5 ng/L (ref <18)
Troponin I (High Sensitivity): 3981 ng/L (ref <18)
Troponin I (High Sensitivity): 17307 ng/L (ref <18)
Troponin I (High Sensitivity): 14363 ng/L (ref <18)

## 2020-09-20 LAB — ECHOCARDIOGRAM COMPLETE
AR max vel: 3.45 cm2
AV Area VTI: 4.15 cm2
AV Area mean vel: 3.09 cm2
AV Mean grad: 2 mmHg
AV Peak grad: 3.8 mmHg
Ao pk vel: 0.98 m/s
Area-P 1/2: 2.8 cm2
Height: 68 in
S' Lateral: 2.9 cm
Weight: 3552 oz

## 2020-09-20 LAB — GLUCOSE, CAPILLARY
Glucose-Capillary: 200 mg/dL — ABNORMAL HIGH (ref 70–99)
Glucose-Capillary: 281 mg/dL — ABNORMAL HIGH (ref 70–99)

## 2020-09-20 LAB — POCT ACTIVATED CLOTTING TIME
Activated Clotting Time: 290 seconds
Activated Clotting Time: 301 seconds

## 2020-09-20 LAB — TSH: TSH: 0.632 u[IU]/mL (ref 0.350–4.500)

## 2020-09-20 LAB — RESPIRATORY PANEL BY RT PCR (FLU A&B, COVID)
Influenza A by PCR: NEGATIVE
Influenza B by PCR: NEGATIVE
SARS Coronavirus 2 by RT PCR: NEGATIVE

## 2020-09-20 LAB — HEMOGLOBIN A1C
Hgb A1c MFr Bld: 11.9 % — ABNORMAL HIGH (ref 4.8–5.6)
Mean Plasma Glucose: 294.83 mg/dL

## 2020-09-20 LAB — HIV ANTIBODY (ROUTINE TESTING W REFLEX): HIV Screen 4th Generation wRfx: NONREACTIVE

## 2020-09-20 SURGERY — LEFT HEART CATH AND CORONARY ANGIOGRAPHY
Anesthesia: LOCAL

## 2020-09-20 MED ORDER — SODIUM CHLORIDE 0.9% FLUSH
3.0000 mL | Freq: Two times a day (BID) | INTRAVENOUS | Status: DC
  Administered 2020-09-20: 3 mL via INTRAVENOUS

## 2020-09-20 MED ORDER — HYDRALAZINE HCL 20 MG/ML IJ SOLN: 10.0000 mg | INTRAMUSCULAR | Status: AC | PRN

## 2020-09-20 MED ORDER — LABETALOL HCL 5 MG/ML IV SOLN: 10.0000 mg | INTRAVENOUS | Status: AC | PRN

## 2020-09-20 MED ORDER — HEPARIN SODIUM (PORCINE) 1000 UNIT/ML IJ SOLN
INTRAMUSCULAR | Status: AC
  Filled 2020-09-20: qty 2

## 2020-09-20 MED ORDER — IOHEXOL 350 MG/ML SOLN
INTRAVENOUS | Status: DC | PRN
  Administered 2020-09-20: 220 mL

## 2020-09-20 MED ORDER — OXYCODONE HCL 5 MG PO TABS: 5.0000 mg | ORAL_TABLET | ORAL | Status: DC | PRN

## 2020-09-20 MED ORDER — CARVEDILOL 6.25 MG PO TABS
6.2500 mg | ORAL_TABLET | Freq: Two times a day (BID) | ORAL | Status: DC
  Administered 2020-09-20 – 2020-09-21 (×2): 6.25 mg via ORAL
  Filled 2020-09-20 (×3): qty 1

## 2020-09-20 MED ORDER — MIDAZOLAM HCL 2 MG/2ML IJ SOLN
INTRAMUSCULAR | Status: DC | PRN
  Administered 2020-09-20 (×3): 1 mg via INTRAVENOUS

## 2020-09-20 MED ORDER — SODIUM CHLORIDE 0.9 % IV SOLN
INTRAVENOUS | Status: AC | PRN
  Administered 2020-09-20: 500 mL via INTRAVENOUS

## 2020-09-20 MED ORDER — ASPIRIN EC 325 MG PO TBEC: 325.0000 mg | DELAYED_RELEASE_TABLET | Freq: Once | ORAL | Status: DC

## 2020-09-20 MED ORDER — ATORVASTATIN CALCIUM 80 MG PO TABS: 80.0000 mg | ORAL_TABLET | Freq: Every day | ORAL | Status: DC

## 2020-09-20 MED ORDER — ONDANSETRON HCL 4 MG/2ML IJ SOLN: 4.0000 mg | Freq: Four times a day (QID) | INTRAMUSCULAR | Status: DC | PRN

## 2020-09-20 MED ORDER — ASPIRIN 81 MG PO CHEW
CHEWABLE_TABLET | ORAL | Status: AC
  Administered 2020-09-20: 324 mg via ORAL
  Filled 2020-09-20: qty 4

## 2020-09-20 MED ORDER — SODIUM CHLORIDE 0.9 % IV SOLN: 250.0000 mL | INTRAVENOUS | Status: DC | PRN

## 2020-09-20 MED ORDER — INSULIN ASPART 100 UNIT/ML ~~LOC~~ SOLN
0.0000 [IU] | Freq: Every day | SUBCUTANEOUS | Status: DC
  Administered 2020-09-20: 3 [IU] via SUBCUTANEOUS

## 2020-09-20 MED ORDER — HEPARIN SODIUM (PORCINE) 5000 UNIT/ML IJ SOLN
5000.0000 [IU] | Freq: Three times a day (TID) | INTRAMUSCULAR | Status: DC
  Administered 2020-09-20 – 2020-09-21 (×3): 5000 [IU] via SUBCUTANEOUS
  Filled 2020-09-20 (×3): qty 1

## 2020-09-20 MED ORDER — CLOPIDOGREL BISULFATE 300 MG PO TABS
ORAL_TABLET | ORAL | Status: AC
  Filled 2020-09-20: qty 2

## 2020-09-20 MED ORDER — FAMOTIDINE IN NACL 20-0.9 MG/50ML-% IV SOLN
INTRAVENOUS | Status: AC
  Filled 2020-09-20: qty 50

## 2020-09-20 MED ORDER — NITROGLYCERIN 0.4 MG SL SUBL: 0.4000 mg | SUBLINGUAL_TABLET | SUBLINGUAL | Status: DC | PRN

## 2020-09-20 MED ORDER — ALPRAZOLAM 0.25 MG PO TABS: 0.2500 mg | ORAL_TABLET | Freq: Two times a day (BID) | ORAL | Status: DC | PRN

## 2020-09-20 MED ORDER — LIDOCAINE VISCOUS HCL 2 % MT SOLN
15.0000 mL | Freq: Once | OROMUCOSAL | Status: AC
  Administered 2020-09-20: 15 mL via ORAL
  Filled 2020-09-20: qty 15

## 2020-09-20 MED ORDER — ASPIRIN 81 MG PO CHEW: 81.0000 mg | CHEWABLE_TABLET | ORAL | Status: DC

## 2020-09-20 MED ORDER — ASPIRIN 81 MG PO CHEW
81.0000 mg | CHEWABLE_TABLET | Freq: Every day | ORAL | Status: DC
  Administered 2020-09-21: 81 mg via ORAL
  Filled 2020-09-20: qty 1

## 2020-09-20 MED ORDER — ACETAMINOPHEN 325 MG PO TABS
650.0000 mg | ORAL_TABLET | ORAL | Status: DC | PRN
  Administered 2020-09-21: 650 mg via ORAL
  Filled 2020-09-20: qty 2

## 2020-09-20 MED ORDER — NITROGLYCERIN IN D5W 200-5 MCG/ML-% IV SOLN
0.0000 ug/min | INTRAVENOUS | Status: DC
  Administered 2020-09-20: 5 ug/min via INTRAVENOUS
  Filled 2020-09-20: qty 250

## 2020-09-20 MED ORDER — SODIUM CHLORIDE 0.9 % WEIGHT BASED INFUSION: 1.0000 mL/kg/h | INTRAVENOUS | Status: DC

## 2020-09-20 MED ORDER — CLOPIDOGREL BISULFATE 300 MG PO TABS
ORAL_TABLET | ORAL | Status: DC | PRN
  Administered 2020-09-20: 600 mg via ORAL

## 2020-09-20 MED ORDER — HEPARIN (PORCINE) IN NACL 1000-0.9 UT/500ML-% IV SOLN
INTRAVENOUS | Status: AC
  Filled 2020-09-20: qty 1000

## 2020-09-20 MED ORDER — SODIUM CHLORIDE 0.9 % WEIGHT BASED INFUSION: 3.0000 mL/kg/h | INTRAVENOUS | Status: DC

## 2020-09-20 MED ORDER — MIDAZOLAM HCL 2 MG/2ML IJ SOLN
INTRAMUSCULAR | Status: AC
  Filled 2020-09-20: qty 2

## 2020-09-20 MED ORDER — NITROGLYCERIN 1 MG/10 ML FOR IR/CATH LAB
INTRA_ARTERIAL | Status: AC
  Filled 2020-09-20: qty 10

## 2020-09-20 MED ORDER — SODIUM CHLORIDE 0.9% FLUSH
3.0000 mL | Freq: Two times a day (BID) | INTRAVENOUS | Status: DC
  Administered 2020-09-21: 3 mL via INTRAVENOUS

## 2020-09-20 MED ORDER — ACETAMINOPHEN 325 MG PO TABS: 650.0000 mg | ORAL_TABLET | ORAL | Status: DC | PRN

## 2020-09-20 MED ORDER — SODIUM CHLORIDE 0.9% FLUSH: 3.0000 mL | INTRAVENOUS | Status: DC | PRN

## 2020-09-20 MED ORDER — FAMOTIDINE IN NACL 20-0.9 MG/50ML-% IV SOLN
INTRAVENOUS | Status: AC | PRN
  Administered 2020-09-20: 20 mg via INTRAVENOUS

## 2020-09-20 MED ORDER — VERAPAMIL HCL 2.5 MG/ML IV SOLN
INTRAVENOUS | Status: DC | PRN
  Administered 2020-09-20: 10 mL via INTRA_ARTERIAL

## 2020-09-20 MED ORDER — ZOLPIDEM TARTRATE 5 MG PO TABS: 5.0000 mg | ORAL_TABLET | Freq: Every evening | ORAL | Status: DC | PRN

## 2020-09-20 MED ORDER — LIDOCAINE HCL (PF) 1 % IJ SOLN
INTRAMUSCULAR | Status: DC | PRN
  Administered 2020-09-20: 2 mL

## 2020-09-20 MED ORDER — HEPARIN (PORCINE) IN NACL 1000-0.9 UT/500ML-% IV SOLN
INTRAVENOUS | Status: DC | PRN
  Administered 2020-09-20 (×2): 500 mL

## 2020-09-20 MED ORDER — ASPIRIN 81 MG PO CHEW: 324.0000 mg | CHEWABLE_TABLET | Freq: Once | ORAL | Status: AC

## 2020-09-20 MED ORDER — FENTANYL CITRATE (PF) 100 MCG/2ML IJ SOLN
INTRAMUSCULAR | Status: DC | PRN
Start: 2020-09-20 — End: 2020-09-20
  Administered 2020-09-20: 50 ug via INTRAVENOUS
  Administered 2020-09-20: 25 ug via INTRAVENOUS

## 2020-09-20 MED ORDER — HEPARIN (PORCINE) 25000 UT/250ML-% IV SOLN
1200.0000 [IU]/h | INTRAVENOUS | Status: DC
  Administered 2020-09-20: 1200 [IU]/h via INTRAVENOUS
  Filled 2020-09-20: qty 250

## 2020-09-20 MED ORDER — INSULIN ASPART 100 UNIT/ML ~~LOC~~ SOLN
0.0000 [IU] | Freq: Three times a day (TID) | SUBCUTANEOUS | Status: DC
  Administered 2020-09-20: 5 [IU] via SUBCUTANEOUS
  Administered 2020-09-21 (×2): 8 [IU] via SUBCUTANEOUS
  Administered 2020-09-21: 5 [IU] via SUBCUTANEOUS

## 2020-09-20 MED ORDER — HEPARIN BOLUS VIA INFUSION
4000.0000 [IU] | Freq: Once | INTRAVENOUS | Status: AC
  Administered 2020-09-20: 4000 [IU] via INTRAVENOUS
  Filled 2020-09-20: qty 4000

## 2020-09-20 MED ORDER — FENTANYL CITRATE (PF) 100 MCG/2ML IJ SOLN
INTRAMUSCULAR | Status: AC
  Filled 2020-09-20: qty 2

## 2020-09-20 MED ORDER — PERFLUTREN LIPID MICROSPHERE
1.0000 mL | INTRAVENOUS | Status: AC | PRN
  Administered 2020-09-20: 6 mL via INTRAVENOUS
  Filled 2020-09-20: qty 10

## 2020-09-20 MED ORDER — SODIUM CHLORIDE 0.9 % WEIGHT BASED INFUSION
1.0000 mL/kg/h | INTRAVENOUS | Status: AC
  Administered 2020-09-20: 1 mL/kg/h via INTRAVENOUS

## 2020-09-20 MED ORDER — CLOPIDOGREL BISULFATE 75 MG PO TABS
75.0000 mg | ORAL_TABLET | Freq: Every day | ORAL | Status: DC
  Administered 2020-09-21: 75 mg via ORAL
  Filled 2020-09-20: qty 1

## 2020-09-20 MED ORDER — ALUM & MAG HYDROXIDE-SIMETH 200-200-20 MG/5ML PO SUSP
30.0000 mL | Freq: Once | ORAL | Status: AC
  Administered 2020-09-20: 30 mL via ORAL
  Filled 2020-09-20: qty 30

## 2020-09-20 MED ORDER — HEPARIN SODIUM (PORCINE) 1000 UNIT/ML IJ SOLN
INTRAMUSCULAR | Status: DC | PRN
  Administered 2020-09-20: 5000 [IU] via INTRAVENOUS
  Administered 2020-09-20: 2500 [IU] via INTRAVENOUS
  Administered 2020-09-20: 3000 [IU] via INTRAVENOUS
  Administered 2020-09-20: 7000 [IU] via INTRAVENOUS

## 2020-09-20 MED ORDER — ATORVASTATIN CALCIUM 80 MG PO TABS
80.0000 mg | ORAL_TABLET | Freq: Every day | ORAL | Status: DC
  Administered 2020-09-20 – 2020-09-21 (×2): 80 mg via ORAL
  Filled 2020-09-20 (×2): qty 1

## 2020-09-20 MED ORDER — LIDOCAINE HCL (PF) 1 % IJ SOLN
INTRAMUSCULAR | Status: AC
  Filled 2020-09-20: qty 30

## 2020-09-20 MED ORDER — HEPARIN SODIUM (PORCINE) 1000 UNIT/ML IJ SOLN
INTRAMUSCULAR | Status: AC
  Filled 2020-09-20: qty 1

## 2020-09-20 SURGICAL SUPPLY — 17 items
BALLN SAPPHIRE 2.5X12 (BALLOONS) ×4
BALLOON SAPPHIRE 2.5X12 (BALLOONS) IMPLANT
CATH 5FR JL3.5 JR4 ANG PIG MP (CATHETERS) ×1 IMPLANT
CATH VISTA GUIDE 6FR XBRCA (CATHETERS) ×1 IMPLANT
DEVICE RAD COMP TR BAND LRG (VASCULAR PRODUCTS) ×1 IMPLANT
GLIDESHEATH SLEND A-KIT 6F 22G (SHEATH) ×1 IMPLANT
GUIDEWIRE INQWIRE 1.5J.035X260 (WIRE) IMPLANT
INQWIRE 1.5J .035X260CM (WIRE) ×2
KIT ENCORE 26 ADVANTAGE (KITS) ×1 IMPLANT
KIT HEART LEFT (KITS) ×2 IMPLANT
KIT HEMO VALVE WATCHDOG (MISCELLANEOUS) ×1 IMPLANT
PACK CARDIAC CATHETERIZATION (CUSTOM PROCEDURE TRAY) ×2 IMPLANT
SHEATH PROBE COVER 6X72 (BAG) ×1 IMPLANT
STENT RESOLUTE ONYX 2.75X18 (Permanent Stent) ×1 IMPLANT
TRANSDUCER W/STOPCOCK (MISCELLANEOUS) ×2 IMPLANT
TUBING CIL FLEX 10 FLL-RA (TUBING) ×2 IMPLANT
WIRE ASAHI PROWATER 180CM (WIRE) ×1 IMPLANT

## 2020-09-20 NOTE — Interval H&P Note (Signed)
Cath Lab Visit (complete for each Cath Lab visit)  Clinical Evaluation Leading to the Procedure:   ACS: Yes.    Non-ACS:    Anginal Classification: CCS IV  Anti-ischemic medical therapy: Minimal Therapy (1 class of medications)  Non-Invasive Test Results: No non-invasive testing performed  Prior CABG: No previous CABG      History and Physical Interval Note:  09/20/2020 1:20 PM  Jerry Shaffer  has presented today for surgery, with the diagnosis of nonstemi.  The various methods of treatment have been discussed with the patient and family. After consideration of risks, benefits and other options for treatment, the patient has consented to  Procedure(s): LEFT HEART CATH AND CORONARY ANGIOGRAPHY (N/A) as a surgical intervention.  The patient's history has been reviewed, patient examined, no change in status, stable for surgery.  I have reviewed the patient's chart and labs.  Questions were answered to the patient's satisfaction.     Belva Crome III

## 2020-09-20 NOTE — H&P (Addendum)
Cardiology Admission History and Physical:   Patient ID: Jerry Shaffer; MRN: [id number]; DOB: June 11, 1961   Admission date: 09/20/2020  Primary Care Provider: Patient, No Pcp Per Primary Cardiologist: Sanda Klein, MD New Primary Electrophysiologist: None    Chief Complaint:  NSTEMI  Patient Profile:   Jerry Shaffer is a 59 y.o. male with a history of TIA/CVA 2013, bipolar d/o, DM, emphysema, remote ETOH/drug use, family history of premature coronary artery disease in his mother who died of a heart attack at age 78, who came to the ER with chest pain, enzymes were elevated.   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  He has never had chest pain before, no history of any cardiac evaluation.  He has quit smoking and no drug use in greater than 20 years.  Jerry Shaffer woke up yesterday afternoon with chest pain.  It was a burning type pain.  He thought it was his usual heartburn but GI medications were no help.  The pain gradually worsened and reached a 9/10.  It was associated with shortness of breath, diaphoresis, and some nausea/vomiting.  He took aspirin 325 mg x 4 because he was concerned that he was having the widow maker and he thought it might help.  He went to work last night, but the symptoms were really bothering him.  He was having episodes of diaphoresis and then chills and then would get nauseated.  He started realizing that something might be seriously wrong and came to the emergency room after he got off work.  His blood pressure was elevated on admission.  His initial troponin was normal but the second 1 was significantly elevated at almost 4000.  Currently, his chest pain is 5/10.  He is being started on heparin and IV nitroglycerin.   Past Medical History:  Diagnosis Date  . Bipolar 1 disorder ([place])   . Diabetes mellitus   . Dyslipidemia   . Emphysema   . Mental disorder    BIPOLAR DISORDER  . Migraine   . NSTEMI  (non-ST elevated myocardial infarction) ([place]) 09/20/2020  . PONV (postoperative nausea and vomiting)   . Renal disorder   . Shortness of breath   . Stroke ([place])   . Transient ischemic attack (TIA)     Past Surgical History:  Procedure Laterality Date  . HEMORROIDECTOMY    . KNEE ARTHROSCOPY    . LUMBAR DISC SURGERY    . TONSILLECTOMY AND ADENOIDECTOMY       Medications Prior to Admission: Prior to Admission medications   Medication Sig Start Date End Date Taking? Authorizing Provider  acetaminophen (TYLENOL) 500 MG tablet Take 1,000 mg by mouth every 6 (six) hours as needed for mild pain.    [provider]  chlorpheniramine-HYDROcodone (TUSSIONEX PENNKINETIC ER) 10-8 MG/5ML SUER Take 2.5 mLs by mouth every 12 (twelve) hours as needed for cough. Patient not taking: Reported on 07/04/2020 02/24/19   Quintella Reichert, MD  cyclobenzaprine (FLEXERIL) 10 MG tablet Take 1 tablet (10 mg total) by mouth 3 (three) times daily as needed for muscle spasms. 07/04/20   Veryl Speak, MD  doxycycline (VIBRAMYCIN) 100 MG capsule Take 1 capsule (100 mg total) by mouth 2 (two) times daily. One po bid x 7 days Patient not taking: Reported on 07/04/2020 02/21/19   Molpus, John, MD  Fluticasone-Salmeterol (ADVAIR DISKUS) 250-50 MCG/DOSE AEPB Take 1 puff twice daily.  Use regularly, not a rescue inhaler. Patient not taking:  Reported on 07/04/2020 02/21/19   Molpus, John, MD  ibuprofen (ADVIL,MOTRIN) 200 MG tablet Take 600 mg by mouth every 6 (six) hours as needed for moderate pain.    [provider]  lidocaine (LIDODERM) 5 % Place 1 patch onto the skin daily. Remove & Discard patch within 12 hours or as directed by MD Patient taking differently: Place 1 patch onto the skin daily as needed (pain). Remove & Discard patch within 12 hours or as directed by MD 06/09/18   Randal Buba, April, MD  metFORMIN (GLUCOPHAGE) 500 MG tablet Take 1 tablet (500 mg total) by mouth daily with breakfast. Patient not  taking: Reported on 07/04/2020 02/24/19   Quintella Reichert, MD  naproxen (NAPROSYN) 500 MG tablet Take 1 tablet (500 mg total) by mouth 2 (two) times daily. 07/04/20   Veryl Speak, MD  ofloxacin (OCUFLOX) 0.3 % ophthalmic solution Place 2 drops into the right eye 4 (four) times daily. Patient not taking: Reported on 02/24/2019 11/19/18   Providence Lanius A, PA-C  OVER THE COUNTER MEDICATION Take 1 tablet by mouth daily. 'Pack of vitamins'    [provider]     Allergies:    Allergies  Allergen Reactions  . Bee Venom Anaphylaxis  . Penicillins Anaphylaxis    Did it involve swelling of the face/tongue/throat, SOB, or low BP? yes Did it involve sudden or severe rash/hives, skin peeling, or any reaction on the inside of your mouth or nose? unknown Did you need to seek medical attention at a hospital or doctor's office? yes When did it last happen?1967 If all above answers are "NO", may proceed with cephalosporin use.   . Procaine Anaphylaxis    Social History:   Social History   Socioeconomic History  . Marital status: Single    Spouse name: Not on file  . Number of children: Not on file  . Years of education: Not on file  . Highest education level: Not on file  Occupational History  . Occupation: Financial controller: [employer]  Tobacco Use  . Smoking status: Former Smoker    Years: 30.00    Types: Cigarettes  . Smokeless tobacco: Current User  Substance and Sexual Activity  . Alcohol use: No  . Drug use: No  . Sexual activity: Never  Other Topics Concern  . Not on file  Social History Narrative  . Not on file   Social Determinants of Health   Financial Resource Strain:   . Difficulty of Paying Living Expenses: Not on file  Food Insecurity:   . Worried About Charity fundraiser in the Last Year: Not on file  . Ran Out of Food in the Last Year: Not on file  Transportation Needs:   . Lack of Transportation (Medical): Not on file  . Lack of  Transportation (Non-Medical): Not on file  Physical Activity:   . Days of Exercise per Week: Not on file  . Minutes of Exercise per Session: Not on file  Stress:   . Feeling of Stress : Not on file  Social Connections:   . Frequency of Communication with Friends and Family: Not on file  . Frequency of Social Gatherings with Friends and Family: Not on file  . Attends Religious Services: Not on file  . Active Member of Clubs or Organizations: Not on file  . Attends Archivist Meetings: Not on file  . Marital Status: Not on file  Intimate Partner Violence:   .  Fear of Current or Ex-Partner: Not on file  . Emotionally Abused: Not on file  . Physically Abused: Not on file  . Sexually Abused: Not on file    Family History:  The patient's family history includes Bipolar disorder in an other family member; CAD (age of onset: 44) in his mother; Coronary artery disease in an other family member; Diabetes type II in an other family member; Lung cancer in his father.   The patient He indicated that his mother is deceased. He indicated that his father is deceased. He indicated that all of his three others are deceased.   ROS:  Please see the history of present illness.  All other ROS reviewed and negative.     Physical Exam/Data:   Vitals:   09/20/20 1028 09/20/20 1030 09/20/20 1100 09/20/20 1155  BP:  (!) 157/107 (!) 159/101 (!) 165/111  Pulse: 84 83 94 (!) 101  Resp: 11 (!) 23 20 13   Temp:      TempSrc:      SpO2: 99% 94% 94% 98%  Weight:      Height:       No intake or output data in the 24 hours ending 09/20/20 1201 Filed Weights   09/20/20 0811  Weight: 100.7 kg   Body mass index is 33.75 kg/m.  General:  Well nourished, well developed, male in mild distress HEENT: normal Lymph: no adenopathy Neck:  JVD not seen elevated, difficult to assess secondary to body habitus Endocrine:  No thryomegaly Vascular: No carotid bruits; 4/4 extremity pulses 2+ bilaterally    Cardiac:  normal S1, S2; RRR; no murmur, no rub or gallop  Lungs:  clear to auscultation bilaterally, no wheezing, rhonchi or rales  Abd: soft, nontender, no hepatomegaly  Ext: No edema Musculoskeletal:  No deformities, BUE and BLE strength normal and equal Skin: warm and dry  Neuro:  CNs 2-12 intact, no focal abnormalities noted Psych:  Normal affect    EKG:  The ECG was personally reviewed: Initial ECG is sinus rhythm, heart rate 92, early R wave transition, Q waves inferior leads, different from 2018 Telemetry: Sinus rhythm, PVCs and salvos  Relevant CV Studies:  None  Laboratory Data:  Chemistry Recent Labs  Lab 09/20/20 0810  NA 141  K 4.4  CL 104  CO2 27  GLUCOSE 114*  BUN 18  CREATININE 0.84  CALCIUM 9.2  GFRNONAA >60  ANIONGAP 10    No results for input(s): PROT, ALBUMIN, AST, ALT, ALKPHOS, BILITOT in the last 168 hours. Hematology Recent Labs  Lab 09/20/20 0810  WBC 13.5*  RBC 5.53  HGB 15.7  HCT 46.9  MCV 84.8  MCH 28.4  MCHC 33.5  RDW 13.0  PLT 265   Cardiac Enzymes  High Sensitivity Troponin:   Recent Labs  Lab 09/20/20 0810 09/20/20 1025  TROPONINIHS 5 3,981*     BNP Recent Labs  Lab 09/20/20 0810  BNP 136.7*    DDimer  Recent Labs  Lab 09/20/20 1025  DDIMER 0.31   Lipids:  Lab Results  Component Value Date   CHOL 104 07/27/2012   HDL 31 (L) 07/27/2012   LDLCALC 55 07/27/2012   TRIG 89 07/27/2012   CHOLHDL 3.4 07/27/2012   INR:  Lab Results  Component Value Date   INR 1.00 08/24/2015   A1c:  Lab Results  Component Value Date   HGBA1C 7.0 (H) 05/03/2013   Thyroid:  Lab Results  Component Value Date   TSH 0.778 02/09/2009  Radiology/Studies:  DG Chest 2 View  Result Date: 09/20/2020 CLINICAL DATA:  Mid chest pain EXAM: CHEST - 2 VIEW COMPARISON:  February 24, 2019 FINDINGS: The cardiomediastinal silhouette is unchanged in contour.Tortuous thoracic aorta. No pleural effusion. No pneumothorax. No acute  pleuroparenchymal abnormality. Visualized abdomen is unremarkable. Multilevel degenerative changes of the thoracic spine. IMPRESSION: No acute cardiopulmonary abnormality. Electronically Signed   By: Valentino Saxon MD   On: 09/20/2020 08:07    Assessment and Plan:   1.  NSTEMI -He is being started on heparin and IV nitroglycerin. -He is being given ASA 81 mg x 4 -He is having ongoing pain - Cardiac catheterization was discussed with the patient fully. The patient understands that risks include but are not limited to stroke (1 in 1000), death (1 in 62), kidney failure [usually temporary] (1 in 500), bleeding (1 in 200), allergic reaction [possibly serious] (1 in 200).  The patient understands and is willing to proceed.   -He has been put on the board  2.  Hypertension: -He is under stress from the MRI, and has not had a.m. medications. -However, the patient states he is not on anything for hypertension or his bipolar disorder. -Add a beta-blocker and follow response  3.  Unknown lipid status -Start high-dose statin, check LFTs and lipid profile  4. DM - use SSI, ck A1c   Principal Problem:   NSTEMI (non-ST elevated myocardial infarction) ([place]) Active Problems:   HTN (hypertension)   For questions or updates, please contact [facility] Please consult [email or web address] for contact info under Cardiology/STEMI.    Signed, Rosaria Ferries, PA-C  09/20/2020 12:01 PM   I have seen and examined the patient along with Rosaria Ferries, PA-C .  I have reviewed the chart, notes and new data.  I agree with PA/NP's note.  Key new complaints: minimal residual discomfort Key examination changes: normal CV exam. Obese. No arrhythmia or signs of hypervolemia. Key new findings / data: ECG with deep, but sharp inferior Q waves and early R/S transition V2 (inferoposterior infarct?), but no acute ST changes. Marked increase in hs Trop I.   PLAN: Urgent cardiac cath and possible PCI-stent  today. This procedure has been fully reviewed with the patient and written informed consent has been obtained. Continue heparin IV. Beta blockers. High dose atorvastatin. Has received ASA.   Sanda Klein, MD, [place] [phone] 09/20/2020, 12:35 PM

## 2020-09-20 NOTE — Progress Notes (Signed)
CRITICAL VALUE ALERT  Critical Value: Troponin 17307  Date & Time Notied:  09/20/2020 @ 5001  Provider Notified: Cecilie Kicks  Orders Received/Actions taken:

## 2020-09-20 NOTE — ED Notes (Signed)
Pt alert, rates chest pain 5/10.  Denies shortness of breath at this time.

## 2020-09-20 NOTE — ED Triage Notes (Signed)
Pt arrives today c/o stating that he has been having chest pain since last night. Pt c/o SHOB as well. Pt states that he is an uncontrolled diabetic. Pt is diaphoretic. Pt states that he has been taking antacids without relief.

## 2020-09-20 NOTE — Progress Notes (Signed)
°  Echocardiogram 2D Echocardiogram has been performed with [facility].  Jerry Shaffer 09/20/2020, 5:08 PM

## 2020-09-20 NOTE — ED Provider Notes (Signed)
[facility] DEPT Provider Note   CSN: [id number] Arrival date & time: 09/20/20  0746     History Chief Complaint  Patient presents with  . Chest Pain    Jerry Shaffer is a 59 y.o. male.  HPI 59 year old male with a history of bipolar 1 disorder, DM type II, dyslipidemia, migraine, TIA presents to the ER with 1 day of chest pain.  Patient states that yesterday evening he began to develop chest pain with progressive shortness of breath.  He initially wrote it off to get gastric reflux, and went to bed.  However this morning when he went to work, he had progressive shortness of breath and feeling winded at work.  He describes the pain as throbbing, squeezing of his heart.  It does not travel anywhere.  He had one episode of nonbloody nonbilious vomiting.  He was noted to be diaphoretic in triage.  He has been taking antacids with little relief.  No prior cardiac history or work-ups done.  He denies any recent travel or surgeries.  No history of blood clots.  Has not noticed any lower extremity swelling.  He does have a family history of MIs.  Denies any fevers, but has felt chilled.  Denies any syncope, nasal congestion, dysuria, diarrhea, constipation.  He is fully vaccinated for Covid.    Past Medical History:  Diagnosis Date  . Bipolar 1 disorder ([place])   . Diabetes mellitus   . Dyslipidemia   . Emphysema   . Mental disorder    BIPOLAR DISORDER  . Migraine   . NSTEMI (non-ST elevated myocardial infarction) ([place]) 09/20/2020  . PONV (postoperative nausea and vomiting)   . Renal disorder   . Shortness of breath   . Stroke ([place])   . Transient ischemic attack (TIA)     Patient Active Problem List   Diagnosis Date Noted  . NSTEMI (non-ST elevated myocardial infarction) ([place]) 09/20/2020  . Cellulitis of hand, left 05/03/2013  . Diverticulitis 07/26/2012  . Left facial numbness 07/26/2012  . Headache(784.0) 07/26/2012  . DM2 (diabetes mellitus, type  2) ([place]) 07/26/2012  . TIA (transient ischemic attack) 04/19/2012  . ATTENTION DEFICIT HYPERACTIVITY DISORDER, ADULT 07/25/2010  . GERD 07/25/2010  . DIVERTICULOSIS OF COLON 07/25/2010  . ABDOMINAL PAIN, LEFT LOWER QUADRANT 07/25/2010  . DIABETES MELLITUS, CONTROLLED 12/21/2009  . MIGRAINE HEADACHE 12/21/2009  . MICROSCOPIC HEMATURIA 12/21/2009  . KNEE PAIN, LEFT 12/21/2009  . CHEST PAIN, INTERMITTENT 12/21/2009  . HTN (hypertension) 12/21/2009    Past Surgical History:  Procedure Laterality Date  . HEMORROIDECTOMY    . KNEE ARTHROSCOPY    . LUMBAR DISC SURGERY    . TONSILLECTOMY AND ADENOIDECTOMY         Family History  Problem Relation Age of Onset  . Diabetes type II Other   . Coronary artery disease Other   . Bipolar disorder Other   . Lung cancer Father   . CAD Mother 6    Social History   Tobacco Use  . Smoking status: Former Smoker    Years: 30.00    Types: Cigarettes  . Smokeless tobacco: Current User  Substance Use Topics  . Alcohol use: No  . Drug use: No    Home Medications Prior to Admission medications   Medication Sig Start Date End Date Taking? Authorizing Provider  acetaminophen (TYLENOL) 500 MG tablet Take 1,000 mg by mouth every 6 (six) hours as needed for mild pain.    [provider]  chlorpheniramine-HYDROcodone (TUSSIONEX PENNKINETIC ER) 10-8 MG/5ML SUER Take 2.5 mLs by mouth every 12 (twelve) hours as needed for cough. Patient not taking: Reported on 07/04/2020 02/24/19   Quintella Reichert, MD  cyclobenzaprine (FLEXERIL) 10 MG tablet Take 1 tablet (10 mg total) by mouth 3 (three) times daily as needed for muscle spasms. 07/04/20   Veryl Speak, MD  doxycycline (VIBRAMYCIN) 100 MG capsule Take 1 capsule (100 mg total) by mouth 2 (two) times daily. One po bid x 7 days Patient not taking: Reported on 07/04/2020 02/21/19   Molpus, John, MD  Fluticasone-Salmeterol (ADVAIR DISKUS) 250-50 MCG/DOSE AEPB Take 1 puff twice daily.  Use regularly,  not a rescue inhaler. Patient not taking: Reported on 07/04/2020 02/21/19   Molpus, Jenny Reichmann, MD  ibuprofen (ADVIL,MOTRIN) 200 MG tablet Take 600 mg by mouth every 6 (six) hours as needed for moderate pain.    [provider]  lidocaine (LIDODERM) 5 % Place 1 patch onto the skin daily. Remove & Discard patch within 12 hours or as directed by MD Patient taking differently: Place 1 patch onto the skin daily as needed (pain). Remove & Discard patch within 12 hours or as directed by MD 06/09/18   Randal Buba, April, MD  metFORMIN (GLUCOPHAGE) 500 MG tablet Take 1 tablet (500 mg total) by mouth daily with breakfast. Patient not taking: Reported on 07/04/2020 02/24/19   Quintella Reichert, MD  naproxen (NAPROSYN) 500 MG tablet Take 1 tablet (500 mg total) by mouth 2 (two) times daily. 07/04/20   Veryl Speak, MD  ofloxacin (OCUFLOX) 0.3 % ophthalmic solution Place 2 drops into the right eye 4 (four) times daily. Patient not taking: Reported on 02/24/2019 11/19/18   Providence Lanius A, PA-C  OVER THE COUNTER MEDICATION Take 1 tablet by mouth daily. 'Pack of vitamins'    [provider]    Allergies    Bee venom, Penicillins, and Procaine  Review of Systems   Review of Systems  Constitutional: Positive for chills. Negative for fever.  HENT: Negative for ear pain and sore throat.   Eyes: Negative for pain and visual disturbance.  Respiratory: Positive for shortness of breath. Negative for cough.   Cardiovascular: Positive for chest pain. Negative for palpitations.  Gastrointestinal: Positive for nausea and vomiting. Negative for abdominal pain.  Genitourinary: Negative for dysuria and hematuria.  Musculoskeletal: Negative for arthralgias and back pain.  Skin: Negative for color change and rash.  Neurological: Positive for weakness. Negative for seizures and syncope.  All other systems reviewed and are negative.   Physical Exam Updated Vital Signs BP (!) 185/110   Pulse (!) 103   Temp 98.4 F  (36.9 C) (Oral)   Resp 15   Ht 5\' 8"  (1.727 m)   Wt 100.7 kg   SpO2 99%   BMI 33.75 kg/m   Physical Exam Vitals and nursing note reviewed.  Constitutional:      General: He is not in acute distress.    Appearance: He is well-developed. He is obese. He is not ill-appearing, toxic-appearing or diaphoretic.  HENT:     Head: Normocephalic and atraumatic.  Eyes:     Conjunctiva/sclera: Conjunctivae normal.  Cardiovascular:     Rate and Rhythm: Normal rate and regular rhythm.     Pulses:          Radial pulses are 2+ on the right side and 2+ on the left side.     Heart sounds: Normal heart sounds. No murmur heard.   Pulmonary:  Effort: Pulmonary effort is normal. No respiratory distress.     Breath sounds: Normal breath sounds. No decreased breath sounds, wheezing, rhonchi or rales.  Chest:     Chest wall: No tenderness.  Abdominal:     Palpations: Abdomen is soft.     Tenderness: There is no abdominal tenderness.  Musculoskeletal:        General: Normal range of motion.     Cervical back: Neck supple.     Right lower leg: No tenderness. No edema.     Left lower leg: No tenderness. No edema.  Skin:    General: Skin is warm and dry.     Capillary Refill: Capillary refill takes less than 2 seconds.     Findings: No erythema or rash.  Neurological:     General: No focal deficit present.     Mental Status: He is alert.  Psychiatric:        Mood and Affect: Mood normal.        Behavior: Behavior normal.     ED Results / Procedures / Treatments   Labs (all labs ordered are listed, but only abnormal results are displayed) Labs Reviewed  BASIC METABOLIC PANEL - Abnormal; Notable for the following components:      Result Value   Glucose, Bld 114 (*)    All other components within normal limits  CBC - Abnormal; Notable for the following components:   WBC 13.5 (*)    All other components within normal limits  URINALYSIS, ROUTINE W REFLEX MICROSCOPIC - Abnormal; Notable  for the following components:   Color, Urine STRAW (*)    Glucose, UA >=500 (*)    Ketones, ur 20 (*)    All other components within normal limits  BRAIN NATRIURETIC PEPTIDE - Abnormal; Notable for the following components:   B Natriuretic Peptide 136.7 (*)    All other components within normal limits  CBG MONITORING, ED - Abnormal; Notable for the following components:   Glucose-Capillary 348 (*)    All other components within normal limits  TROPONIN I (HIGH SENSITIVITY) - Abnormal; Notable for the following components:   Troponin I (High Sensitivity) 3,981 (*)    All other components within normal limits  RESPIRATORY PANEL BY RT PCR (FLU A&B, COVID)  D-DIMER, QUANTITATIVE (NOT AT [facility])  HEPARIN LEVEL (UNFRACTIONATED)  TROPONIN I (HIGH SENSITIVITY)    EKG EKG Interpretation  Date/Time:  Thursday September 20 2020 07:53:17 EDT Ventricular Rate:  92 PR Interval:    QRS Duration: 93 QT Interval:  355 QTC Calculation: 440 R Axis:   -11 Text Interpretation: Sinus rhythm Abnormal R-wave progression, early transition Inferior infarct, old 12 Lead; [place]ar No significant change since last tracing Confirmed by Lacretia Leigh ([id number]) on 09/20/2020 9:38:09 AM   Radiology DG Chest 2 View  Result Date: 09/20/2020 CLINICAL DATA:  Mid chest pain EXAM: CHEST - 2 VIEW COMPARISON:  February 24, 2019 FINDINGS: The cardiomediastinal silhouette is unchanged in contour.Tortuous thoracic aorta. No pleural effusion. No pneumothorax. No acute pleuroparenchymal abnormality. Visualized abdomen is unremarkable. Multilevel degenerative changes of the thoracic spine. IMPRESSION: No acute cardiopulmonary abnormality. Electronically Signed   By: Valentino Saxon MD   On: 09/20/2020 08:07    Procedures .Critical Care Performed by: Garald Balding, PA-C Authorized by: Garald Balding, PA-C   Critical care provider statement:    Critical care time (minutes):  40   Critical care was necessary to treat or  prevent imminent or life-threatening deterioration of  the following conditions:  Cardiac failure   Critical care was time spent personally by me on the following activities:  Discussions with consultants, evaluation of patient's response to treatment, examination of patient, ordering and performing treatments and interventions, ordering and review of laboratory studies, ordering and review of radiographic studies, pulse oximetry, re-evaluation of patient's condition, obtaining history from patient or surrogate and review of old charts   (including critical care time)  Medications Ordered in ED Medications  heparin ADULT infusion 100 units/mL (25000 units/276mL sodium chloride 0.45%) (1,200 Units/hr Intravenous New Bag/Given 09/20/20 1144)  nitroGLYCERIN 50 mg in dextrose 5 % 250 mL (0.2 mg/mL) infusion (5 mcg/min Intravenous New Bag/Given 09/20/20 1154)  alum & mag hydroxide-simeth (MAALOX/MYLANTA) 200-200-20 MG/5ML suspension 30 mL (30 mLs Oral Given 09/20/20 1022)    And  lidocaine (XYLOCAINE) 2 % viscous mouth solution 15 mL (15 mLs Oral Given 09/20/20 1022)  heparin bolus via infusion 4,000 Units (4,000 Units Intravenous Bolus from Bag 09/20/20 1145)  aspirin chewable tablet 324 mg (324 mg Oral Given 09/20/20 1156)    ED Course  I have reviewed the triage vital signs and the nursing notes.  Pertinent labs & imaging results that were available during my care of the patient were reviewed by me and considered in my medical decision making (see chart for details).    MDM Rules/Calculators/A&P                          59 year old male with chest pain began yesterday Presentation, he is alert, oriented, nontoxic-appearing, no acute distress, nondiaphoretic, speaking full sentences without increased work of breathing.  He is hypertensive here in the ED with a blood pressure of 159/98 on arrival, however not tachycardic or tachypneic on my exam.  Oxygen sats at 98%.  Physical exam without  abnormalities, lung sounds clear, abdomen soft and nontender, no noticeable edema to the lower extremities.  The emergent causes of chest pain include: Acute coronary syndrome, tamponade, pericarditis/myocarditis, aortic dissection, pulmonary embolism, tension pneumothorax, pneumonia, and esophageal rupture.  Other urgent/non-acute considerations include, but are not limited to: chronic angina, aortic stenosis, cardiomyopathy, mitral valve prolapse, pulmonary hypertension, aortic insufficiency, right ventricular hypertrophy, pleuritis, bronchitis, pneumothorax, tumor, gastroesophageal reflux disease (GERD), esophageal spasm, Mallory-Weiss syndrome, peptic ulcer disease, pancreatitis, functional gastrointestinal pain, cervical or thoracic disk disease or arthritis, shoulder arthritis, costochondritis, subacromial bursitis, anxiety or panic attack, herpes zoster, breast disorders, chest wall tumors, thoracic outlet syndrome, mediastinitis.  Patient CBC with mild leukocytosis of 13.7, normal hemoglobin.  BMP without significant electrode abnormalities, normal renal function.  His Covid test is negative.  UA with glucose urea of more than 500, ketones of 20.  His D-dimer here is negative.  Chest x-ray without acute abnormalities.  EKG largely unchanged from previous.  Most notably, patient's initial troponin was 5, however nursing staff received critical call that his second troponin had increased to 3981.  I consulted  [facility], started on heparin per pharmacy and as per discussion with Dr. Zenia Resides.  Cardiology will admit with plans to take him to the OR for catheterization.  He remains hemodynamically stable at this time.  Case discussed with Dr. Zenia Resides who is agreeable to the above plan and disposition.  Final Clinical Impression(s) / ED Diagnoses Final diagnoses:  NSTEMI (non-ST elevated myocardial infarction) [facility])    Rx / [place] Orders ED Discharge Orders    None       Garald Balding,  PA-C  09/20/20 1212    Lacretia Leigh, MD 09/24/20 1355

## 2020-09-20 NOTE — ED Notes (Signed)
CRITICAL VALUE STICKER  CRITICAL VALUE: Troponin 3981  RECEIVER (on-site recipient of call): Csimpson, RN   DATE & TIME NOTIFIED: 09/20/20 1108  MESSENGER (representative from lab):JMecial  MD NOTIFIED: Zenia Resides  TIME OF NOTIFICATION: 1110  RESPONSE: see orders

## 2020-09-20 NOTE — ED Notes (Addendum)
Pt BP at 171/113. States chest pain is still at 5/10 as a constant pain. Denies SHOB at this time.

## 2020-09-20 NOTE — ED Notes (Signed)
BP currently 155/116. Pt states pain is unchanged. Pt denies SHOB at this time. Will continue to monitor.

## 2020-09-20 NOTE — ED Notes (Signed)
BP 165/111. Chest pain rated 5/10

## 2020-09-20 NOTE — Progress Notes (Signed)
[facility] for heparin Indication: chest pain/ACS  Allergies  Allergen Reactions  . Bee Venom Anaphylaxis  . Penicillins Anaphylaxis    Did it involve swelling of the face/tongue/throat, SOB, or low BP? yes Did it involve sudden or severe rash/hives, skin peeling, or any reaction on the inside of your mouth or nose? unknown Did you need to seek medical attention at a hospital or doctor's office? yes When did it last happen?1967 If all above answers are "NO", may proceed with cephalosporin use.   . Procaine Anaphylaxis    Patient Measurements: Height: 5\' 8"  (172.7 cm) Weight: 100.7 kg (222 lb) IBW/kg (Calculated) : 68.4 Heparin Dosing Weight: 90 kg  Vital Signs: Temp: 98.4 F (36.9 C) (10/07 0754) Temp Source: Oral (10/07 0754) BP: 159/101 (10/07 1100) Pulse Rate: 94 (10/07 1100)  Labs: Recent Labs    09/20/20 0810 09/20/20 1025  HGB 15.7  --   HCT 46.9  --   PLT 265  --   CREATININE 0.84  --   TROPONINIHS 5 3,981*    Estimated Creatinine Clearance: 111.6 mL/min (by C-G formula based on SCr of 0.84 mg/dL).   Assessment: Patient is a 59 y.o M presented to the ED on 10/7 with c/o CP.  Troponin was found to be elevated.  Pharmacy is consulted to start heparin drip for ACS.  Today, 09/20/2020: - cbc ok - scr 0.84 (crcl~100) - Ddimer 0.31  Goal of Therapy:  Heparin level 0.3-0.7 units/ml Monitor platelets by anticoagulation protocol: Yes   Plan:  - heparin 4000 units IV x1 bolus, then drip at 1200 units/hr - check 6 hr heparin level - monitor for s/sx bleeding  Jibril Mcminn P 09/20/2020,11:17 AM

## 2020-09-20 NOTE — CV Procedure (Signed)
   Inferior non-ST elevation MI due to collateralization of the distal PDA from the LAD.  100% mid PDA reduced to 0% with TIMI grade III flow using a 2.75 x 18 Onyx DES..  70% mid LAD forming a [place] 010 bifurcation stenosis with a large diagonal.  Second obtuse marginal with segmental 50 to 60% narrowing.  Inferior wall hypo-/akinesis.  EF 50%.  LVEDP 5 mmHg.  IV nitroglycerin discontinued and IV fluid bolus given because of soft blood pressures.

## 2020-09-21 ENCOUNTER — Encounter (HOSPITAL_COMMUNITY): Payer: Self-pay | Admitting: Interventional Cardiology

## 2020-09-21 ENCOUNTER — Encounter: Payer: Self-pay | Admitting: Physician Assistant

## 2020-09-21 ENCOUNTER — Other Ambulatory Visit (HOSPITAL_COMMUNITY): Payer: Self-pay | Admitting: Physician Assistant

## 2020-09-21 ENCOUNTER — Telehealth: Payer: Self-pay | Admitting: *Deleted

## 2020-09-21 DIAGNOSIS — E1159 Type 2 diabetes mellitus with other circulatory complications: Secondary | ICD-10-CM | POA: Diagnosis not present

## 2020-09-21 DIAGNOSIS — Z20822 Contact with and (suspected) exposure to covid-19: Secondary | ICD-10-CM | POA: Diagnosis not present

## 2020-09-21 DIAGNOSIS — I214 Non-ST elevation (NSTEMI) myocardial infarction: Secondary | ICD-10-CM | POA: Diagnosis not present

## 2020-09-21 DIAGNOSIS — Z006 Encounter for examination for normal comparison and control in clinical research program: Secondary | ICD-10-CM | POA: Diagnosis not present

## 2020-09-21 DIAGNOSIS — I10 Essential (primary) hypertension: Secondary | ICD-10-CM | POA: Diagnosis not present

## 2020-09-21 DIAGNOSIS — I2510 Atherosclerotic heart disease of native coronary artery without angina pectoris: Secondary | ICD-10-CM | POA: Diagnosis not present

## 2020-09-21 LAB — BASIC METABOLIC PANEL
Anion gap: 9 (ref 5–15)
Anion gap: 10 (ref 5–15)
BUN: 11 mg/dL (ref 6–20)
BUN: 17 mg/dL (ref 6–20)
CO2: 21 mmol/L — ABNORMAL LOW (ref 22–32)
CO2: 23 mmol/L (ref 22–32)
Calcium: 8.5 mg/dL — ABNORMAL LOW (ref 8.9–10.3)
Calcium: 8.8 mg/dL — ABNORMAL LOW (ref 8.9–10.3)
Chloride: 103 mmol/L (ref 98–111)
Chloride: 100 mmol/L (ref 98–111)
Creatinine, Ser: 0.65 mg/dL (ref 0.61–1.24)
Creatinine, Ser: 0.7 mg/dL (ref 0.61–1.24)
GFR calc non Af Amer: >60 mL/min (ref >60)
GFR, Estimated: >60 mL/min (ref >60)
Glucose, Bld: 254 mg/dL — ABNORMAL HIGH (ref 70–99)
Glucose, Bld: 197 mg/dL — ABNORMAL HIGH (ref 70–99)
Potassium: 3.9 mmol/L (ref 3.5–5.1)
Potassium: 4.3 mmol/L (ref 3.5–5.1)
Sodium: 133 mmol/L — ABNORMAL LOW (ref 135–145)
Sodium: 133 mmol/L — ABNORMAL LOW (ref 135–145)

## 2020-09-21 LAB — CBC
HCT: 42.8 % (ref 39.0–52.0)
Hemoglobin: 13.9 g/dL (ref 13.0–17.0)
MCH: 27.9 pg (ref 26.0–34.0)
MCHC: 32.5 g/dL (ref 30.0–36.0)
MCV: 85.8 fL (ref 80.0–100.0)
Platelets: 224 10*3/uL (ref 150–400)
RBC: 4.99 MIL/uL (ref 4.22–5.81)
RDW: 13 % (ref 11.5–15.5)
WBC: 9.3 10*3/uL (ref 4.0–10.5)
nRBC: 0 % (ref 0.0–0.2)

## 2020-09-21 LAB — GLUCOSE, CAPILLARY
Glucose-Capillary: 282 mg/dL — ABNORMAL HIGH (ref 70–99)
Glucose-Capillary: 272 mg/dL — ABNORMAL HIGH (ref 70–99)
Glucose-Capillary: 250 mg/dL — ABNORMAL HIGH (ref 70–99)

## 2020-09-21 MED ORDER — CLOPIDOGREL BISULFATE 75 MG PO TABS: 75.0000 mg | ORAL_TABLET | Freq: Every day | ORAL | 11 refills | Status: DC

## 2020-09-21 MED ORDER — CARVEDILOL 3.125 MG PO TABS: 3.1250 mg | ORAL_TABLET | Freq: Two times a day (BID) | ORAL | 11 refills | Status: DC

## 2020-09-21 MED ORDER — METFORMIN HCL 500 MG PO TABS: 500.0000 mg | ORAL_TABLET | Freq: Every day | ORAL | 11 refills | Status: DC

## 2020-09-21 MED ORDER — CARVEDILOL 6.25 MG PO TABS: 6.2500 mg | ORAL_TABLET | Freq: Two times a day (BID) | ORAL | 11 refills | Status: DC

## 2020-09-21 MED ORDER — CARVEDILOL 3.125 MG PO TABS: 3.1250 mg | ORAL_TABLET | Freq: Two times a day (BID) | ORAL | Status: DC

## 2020-09-21 MED ORDER — NITROGLYCERIN 0.4 MG SL SUBL: 0.4000 mg | SUBLINGUAL_TABLET | SUBLINGUAL | 12 refills | Status: DC | PRN

## 2020-09-21 MED ORDER — ASPIRIN 81 MG PO CHEW: 81.0000 mg | CHEWABLE_TABLET | Freq: Every day | ORAL | Status: DC

## 2020-09-21 MED ORDER — ATORVASTATIN CALCIUM 80 MG PO TABS: 80.0000 mg | ORAL_TABLET | Freq: Every day | ORAL | 11 refills | Status: DC

## 2020-09-21 MED ORDER — STUDY - AEGIS II STUDY - PLACEBO OR CSL112 (PI-HILTY)
170.0000 mL | Freq: Once | INTRAVENOUS | Status: AC
  Administered 2020-09-21: 170 mL via INTRAVENOUS
  Filled 2020-09-21: qty 170

## 2020-09-21 MED FILL — CLOPIDOGREL 75 MG TABLET: 75 | 30 days supply | Qty: 30 | Fill #0

## 2020-09-21 MED FILL — NITROGLYCERIN 0.4 MG TAB SL: 0.4 | 8 days supply | Qty: 25 | Fill #0

## 2020-09-21 MED FILL — ATORVASTATIN CALCIUM 80 MG: 80 | 30 days supply | Qty: 30 | Fill #0

## 2020-09-21 MED FILL — CARVEDILOL 6.25 MG TABLET: 6.25 | 30 days supply | Qty: 60 | Fill #0

## 2020-09-21 MED FILL — metFORMIN HCL 500 MG TABS: 500 | 30 days supply | Qty: 30 | Fill #0

## 2020-09-21 NOTE — Progress Notes (Signed)
CARDIAC REHAB PHASE I   PRE:  Rate/Rhythm: 96 SR  BP:  Supine:   Sitting: 109/73     SaO2:   MODE:  Ambulation: 470 ft   POST:  Rate/Rhythm: 110 ST  BP:  Supine:   Sitting: 114/69    SaO2:   Pt was lying in bed upon arrival. Pt was independent with getting out of bed. He ambulated 434ft independently with no complaints or concerns. Pt tolerated ambulation well and returned seated on bed. Discussed stent card, MI booklet, exercise guidelines restrictions, diabetic nutrition, heart healthy diet, and CRP II. Pt was receptive. Will send referral to [place] CRP II.  [id number]  Rick Duff, MS, CEP 09/21/2020 11:46 AM

## 2020-09-21 NOTE — Telephone Encounter (Signed)
-----   Message from Lonn Georgia, PA-C sent at 09/21/2020  2:07 PM EDT ----- Pt of MCr. Could you please refer him to Endocrinology, Dr Loanne Drilling for diabetes management, A1c was 11.9 when he was here with his MI.  Thanks!

## 2020-09-21 NOTE — Research (Signed)
.   AEGIS Informed Consent   Subject Name: Jerry Shaffer  Subject met inclusion and exclusion criteria.  The informed consent form, study requirements and expectations were reviewed with the subject and questions and concerns were addressed prior to the signing of the consent form.  The subject verbalized understanding of the trail requirements.  The subject agreed to participate in the AEGIS trial and signed the informed consent.  The informed consent was obtained prior to performance of any protocol-specific procedures for the subject.  A copy of the signed informed consent was given to the subject and a copy was placed in the subject's medical record.  Philemon Kingdom D 09/21/2020, 1546 pm

## 2020-09-21 NOTE — Discharge Summary (Addendum)
Discharge Summary    Patient ID: Jerry Shaffer MRN: [id number]; DOB: 1961/09/04  Admit date: 09/20/2020 Discharge date: 09/21/2020  Primary Care Provider: Patient, No Pcp Per  Primary Cardiologist: Sanda Klein, MD  Primary Electrophysiologist:  None   Discharge Diagnoses    Principal Problem:   NSTEMI (non-ST elevated myocardial infarction) [facility]) Active Problems:   Type II diabetes mellitus ([place])   Attention deficit hyperactivity disorder (ADHD)   HTN (hypertension)   Non-ST elevation MI (NSTEMI) ([place])   Allergies Allergies  Allergen Reactions  . Bee Venom Anaphylaxis  . Penicillins Anaphylaxis    Did it involve swelling of the face/tongue/throat, SOB, or low BP? yes Did it involve sudden or severe rash/hives, skin peeling, or any reaction on the inside of your mouth or nose? unknown Did you need to seek medical attention at a hospital or doctor's office? yes When did it last happen?1967 If all above answers are "NO", may proceed with cephalosporin use.   . Procaine Anaphylaxis    Diagnostic Studies/Procedures    ECHO: 09/20/2020 1. Left ventricular ejection fraction, by estimation, is 55 to 60%. The  left ventricle has normal function. The left ventricle has no regional  wall motion abnormalities. There is severe left ventricular hypertrophy.  Left ventricular diastolic parameters  are consistent with Grade I diastolic dysfunction (impaired relaxation).  2. Right ventricular systolic function is normal. The right ventricular  size is normal. Tricuspid regurgitation signal is inadequate for assessing  PA pressure.  3. The mitral valve is normal in structure. No evidence of mitral valve  regurgitation. No evidence of mitral stenosis.  4. The aortic valve is tricuspid. Aortic valve regurgitation is not  visualized. No aortic stenosis is present.  5. Aortic dilatation noted. There is mild dilatation of the aortic root,  measuring 38 mm.  6. The  inferior vena cava is normal in size with greater than 50%  respiratory variability, suggesting right atrial pressure of 3 mmHg.   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  This vessel is felt to represent the culprit for the patient's presentation.  Total occlusion of the mid PDA reduced to 0% using an 18 x 2.75 mm Onyx deployed at 12 atm x 2 inflations.  TIMI grade III flow was noted.  Resolution of chest pain occurred.  Proximal to the stented segment there is a 30 to 40% stenosis in the PDA.  Left main is widely patent  LAD contains mid vessel disease with a Medina 011 bifurcation stenosis.  LAD is 70% and diagonal is 40%.  This can be managed medically.  Circumflex contains segmental 40 to 50% stenosis in the large second obtuse marginal.  RCA is dominant, tortuous, and no significant disease other than that noted above in the PDA.  Mid inferior wall to apical akinesis.  LVEDP 6 mmHg.  EF 45 to 50%.  RECOMMENDATIONS:   Aspirin and Plavix for 1 year.  IV nitroglycerin discontinued.  Check hemoglobin A1c.  Aggressive statin therapy for risk reduction.  Smoking cessation  Potential discharge in 24 to 36 hours depending upon clinical course. Intervention    _____________   History of Present Illness     Jerry Shaffer is a 59 y.o. male with a history of TIA/CVA 2013, bipolar d/o, DM, emphysema, remote ETOH/drug use, family history of premature coronary artery disease in his mother who died  of a heart attack at age 34, who was admitted 10/07 with a NSTEMI.   Hospital Course     Consultants: None   [name]'s chest pain started the day before admission. In the ER, he was having ongoing chest pain at 5/10 in the ER. He was given ASA, started on heparin and nitrates. ECG did not meet STEMI criteria, but he was taken to the cath lab ASAP.  COVID negative.   Cardiac cath results above. S/p DES PDA w/ med rx for residual mild-mod dz in multiple vessels. He was started on ASA, Plavix, high-dose statin, BB.   His A1c is 11.9, he is being restarted on his metformin, needs to find a new PCP.   Lipid profile below, take high-dose Lipitor and f/u as outpt.   BP control improved on BB, continue this.  On 10/08, he was seen by cardiac rehab and ambulated w/out CP or SOB. His cath site was stable.  He was seen by [facility] and qualifies for the [facility] study. He was given his first infusion prior to d/c and will follow up with them as an outpatient.    Dr Sallyanne Kuster reviewed all data and no further inpatient workup is indicated . He is considered stable for discharge, to follow up as an outpatient.   Did the patient have an acute coronary syndrome (MI, NSTEMI, STEMI, etc) this admission?:  Yes                               AHA/ACC Clinical Performance & Quality Measures: 7. Aspirin prescribed? - Yes 8. ADP Receptor Inhibitor (Plavix/Clopidogrel, Brilinta/Ticagrelor or Effient/Prasugrel) prescribed (includes medically managed patients)? - Yes 9. Beta Blocker prescribed? - Yes 10. High Intensity Statin (Lipitor 40-80mg  or Crestor 20-40mg ) prescribed? - Yes 11. EF assessed during THIS hospitalization? - Yes 12. For EF <40%, was ACEI/ARB prescribed? - Not Applicable (EF >/= 96%) 13. For EF <40%, Aldosterone Antagonist (Spironolactone or Eplerenone) prescribed? - Not Applicable (EF >/= 04%) 14. Cardiac Rehab Phase II ordered (including medically managed patients)? - Yes   _____________  Discharge Vitals Blood pressure 109/73, pulse 99, temperature 98.2 F (36.8 C), temperature source Oral, resp. rate 18, height 5\' 8"  (1.727 m), weight 102.3 kg, SpO2 98 %.  Filed Weights   09/20/20 0811 09/21/20 0500  Weight: 100.7 kg 102.3 kg  GEN: No acute distress.   Neck: No JVD Cardiac: RRR, no murmur, no rubs, or gallops.  Respiratory: clear  bilaterally GI: Soft, nontender, non-distended  MS: No edema; No deformity. R radial cath site w/out ecchymosis or hematoma, but is tender.  Neuro:  Nonfocal  Psych: Normal affect    Labs & Radiologic Studies    CBC Recent Labs    09/20/20 1559 09/21/20 0210  WBC 11.6* 9.3  HGB 14.0 13.9  HCT 42.9 42.8  MCV 84.8 85.8  PLT 252 540   Basic Metabolic Panel Recent Labs    09/20/20 0810 09/20/20 0810 09/20/20 1559 09/21/20 0210  NA 141  --   --  133*  K 4.4  --   --  3.9  CL 104  --   --  103  CO2 27  --   --  21*  GLUCOSE 114*  --   --  254*  BUN 18  --   --  11  CREATININE 0.84   < > 0.61 0.65  CALCIUM 9.2  --   --  8.5*   < > =  values in this interval not displayed.   Liver Function Tests Recent Labs    09/20/20 1559  AST 33  ALT 15  ALKPHOS 78  BILITOT 0.8  PROT 6.1*  ALBUMIN 3.6   No results for input(s): LIPASE, AMYLASE in the last 72 hours. High Sensitivity Troponin:   Recent Labs  Lab 09/20/20 0810 09/20/20 1025 09/20/20 1559 09/20/20 1732  TROPONINIHS 5 3,981* 17,307* 14,363*    BNP Invalid input(s): POCBNP D-Dimer Recent Labs    09/20/20 1025  DDIMER 0.31   Hemoglobin A1C Recent Labs    09/20/20 1559  HGBA1C 11.9*   Fasting Lipid Panel Recent Labs    09/20/20 1559  CHOL 161  HDL 31*  LDLCALC 105*  TRIG 124  CHOLHDL 5.2   Thyroid Function Tests Recent Labs    09/20/20 1559  TSH 0.632   _____________  DG Chest 2 View  Result Date: 09/20/2020 CLINICAL DATA:  Mid chest pain EXAM: CHEST - 2 VIEW COMPARISON:  February 24, 2019 FINDINGS: The cardiomediastinal silhouette is unchanged in contour.Tortuous thoracic aorta. No pleural effusion. No pneumothorax. No acute pleuroparenchymal abnormality. Visualized abdomen is unremarkable. Multilevel degenerative changes of the thoracic spine. IMPRESSION: No acute cardiopulmonary abnormality. Electronically Signed   By: Valentino Saxon MD   On: 09/20/2020 08:07   CARDIAC  CATHETERIZATION  Result Date: 09/20/2020  Total occlusion of the mid PDA (a large vessel that extends to the left ventricular apex), filling sluggishly by apical LAD to PDA collaterals.  This vessel is felt to represent the culprit for the patient's presentation.  Total occlusion of the mid PDA reduced to 0% using an 18 x 2.75 mm Onyx deployed at 12 atm x 2 inflations.  TIMI grade III flow was noted.  Resolution of chest pain occurred.  Proximal to the stented segment there is a 30 to 40% stenosis in the PDA.  Left main is widely patent  LAD contains mid vessel disease with a Medina 011 bifurcation stenosis.  LAD is 70% and diagonal is 40%.  This can be managed medically.  Circumflex contains segmental 40 to 50% stenosis in the large second obtuse marginal.  RCA is dominant, tortuous, and no significant disease other than that noted above in the PDA.  Mid inferior wall to apical akinesis.  LVEDP 6 mmHg.  EF 45 to 50%. RECOMMENDATIONS:  Aspirin and Plavix for 1 year.  IV nitroglycerin discontinued.  Check hemoglobin A1c.  Aggressive statin therapy for risk reduction.  Smoking cessation  Potential discharge in 24 to 36 hours depending upon clinical course.  ECHOCARDIOGRAM COMPLETE  Result Date: 09/20/2020    ECHOCARDIOGRAM REPORT   Patient Name:   Jerry Shaffer Date of Exam: 09/20/2020 Medical Rec #:  [id number]       Height:       68.0 in Accession #:    [id number]      Weight:       222.0 lb Date of Birth:  September 19, 1961      BSA:          2.136 m Patient Age:    39 years        BP:           134/92 mmHg Patient Gender: M               HR:           92 bpm. Exam Location:  Inpatient Procedure: 2D Echo, Intracardiac Opacification Agent, Cardiac Doppler and Color  Doppler Indications:    NSTEMI  History:        Patient has prior history of Echocardiogram examinations, most                 recent 04/19/2012. Risk Factors:Former Smoker, Hypertension,                 Diabetes and Dyslipidemia.  H/O TIA.  Sonographer:    Clayton Lefort RDCS (AE) Referring Phys: 1993 [facility] G BARRETT  Sonographer Comments: Suboptimal parasternal window, suboptimal apical window and patient is morbidly obese. IMPRESSIONS  1. Left ventricular ejection fraction, by estimation, is 55 to 60%. The left ventricle has normal function. The left ventricle has no regional wall motion abnormalities. There is severe left ventricular hypertrophy. Left ventricular diastolic parameters  are consistent with Grade I diastolic dysfunction (impaired relaxation).  2. Right ventricular systolic function is normal. The right ventricular size is normal. Tricuspid regurgitation signal is inadequate for assessing PA pressure.  3. The mitral valve is normal in structure. No evidence of mitral valve regurgitation. No evidence of mitral stenosis.  4. The aortic valve is tricuspid. Aortic valve regurgitation is not visualized. No aortic stenosis is present.  5. Aortic dilatation noted. There is mild dilatation of the aortic root, measuring 38 mm.  6. The inferior vena cava is normal in size with greater than 50% respiratory variability, suggesting right atrial pressure of 3 mmHg. FINDINGS  Left Ventricle: Left ventricular ejection fraction, by estimation, is 55 to 60%. The left ventricle has normal function. The left ventricle has no regional wall motion abnormalities. Definity contrast agent was given IV to delineate the left ventricular  endocardial borders. The left ventricular internal cavity size was normal in size. There is severe left ventricular hypertrophy. Left ventricular diastolic parameters are consistent with Grade I diastolic dysfunction (impaired relaxation). Right Ventricle: The right ventricular size is normal. No increase in right ventricular wall thickness. Right ventricular systolic function is normal. Tricuspid regurgitation signal is inadequate for assessing PA pressure. Left Atrium: Left atrial size was normal in size. Right Atrium:  Right atrial size was normal in size. Pericardium: There is no evidence of pericardial effusion. Mitral Valve: The mitral valve is normal in structure. No evidence of mitral valve regurgitation. No evidence of mitral valve stenosis. Tricuspid Valve: The tricuspid valve is not well visualized. Tricuspid valve regurgitation is not demonstrated. Aortic Valve: The aortic valve is tricuspid. Aortic valve regurgitation is not visualized. No aortic stenosis is present. Aortic valve mean gradient measures 2.0 mmHg. Aortic valve peak gradient measures 3.8 mmHg. Aortic valve area, by VTI measures 4.15 cm. Pulmonic Valve: The pulmonic valve was not well visualized. Pulmonic valve regurgitation is not visualized. Aorta: Aortic dilatation noted. There is mild dilatation of the aortic root, measuring 38 mm. Venous: The inferior vena cava is normal in size with greater than 50% respiratory variability, suggesting right atrial pressure of 3 mmHg. IAS/Shunts: No atrial level shunt detected by color flow Doppler.  LEFT VENTRICLE PLAX 2D LVIDd:         3.90 cm  Diastology LVIDs:         2.90 cm  LV e' medial:    5.33 cm/s LV PW:         1.50 cm  LV E/e' medial:  8.2 LV IVS:        2.20 cm  LV e' lateral:   6.64 cm/s LVOT diam:     2.20 cm  LV E/e' lateral: 6.6 LV  SV:         77 LV SV Index:   36 LVOT Area:     3.80 cm  RIGHT VENTRICLE             IVC RV Basal diam:  3.30 cm     IVC diam: 1.00 cm RV S prime:     14.65 cm/s TAPSE (M-mode): 2.3 cm LEFT ATRIUM             Index       RIGHT ATRIUM           Index LA diam:        3.90 cm 1.83 cm/m  RA Area:     19.50 cm LA Vol (A2C):   31.5 ml 14.74 ml/m RA Volume:   58.30 ml  27.29 ml/m LA Vol (A4C):   41.5 ml 19.42 ml/m LA Biplane Vol: 40.1 ml 18.77 ml/m  AORTIC VALVE AV Area (Vmax):    3.45 cm AV Area (Vmean):   3.09 cm AV Area (VTI):     4.15 cm AV Vmax:           97.90 cm/s AV Vmean:          73.300 cm/s AV VTI:            0.185 m AV Peak Grad:      3.8 mmHg AV Mean Grad:       2.0 mmHg LVOT Vmax:         88.90 cm/s LVOT Vmean:        59.500 cm/s LVOT VTI:          0.202 m LVOT/AV VTI ratio: 1.09  AORTA Ao Root diam: 3.80 cm MITRAL VALVE MV Area (PHT): 2.80 cm    SHUNTS MV Decel Time: 271 msec    Systemic VTI:  0.20 m MV E velocity: 43.80 cm/s  Systemic Diam: 2.20 cm MV A velocity: 55.00 cm/s MV E/A ratio:  0.80 Loralie Champagne MD Electronically signed by Loralie Champagne MD Signature Date/Time: 09/20/2020/6:14:08 PM    Final    Disposition   Pt is being discharged home today in improved condition.  Follow-up Plans & Appointments     Discharge Instructions    Amb Referral to Cardiac Rehabilitation   Complete by: As directed    Diagnosis:  NSTEMI Coronary Stents     After initial evaluation and assessments completed: Virtual Based Care may be provided alone or in conjunction with Phase 2 Cardiac Rehab based on patient barriers.: Yes   Diet - low sodium heart healthy   Complete by: As directed    Diet Carb Modified   Complete by: As directed    Increase activity slowly   Complete by: As directed       Discharge Medications   Allergies as of 09/21/2020      Reactions   Bee Venom Anaphylaxis   Penicillins Anaphylaxis   Did it involve swelling of the face/tongue/throat, SOB, or low BP? yes Did it involve sudden or severe rash/hives, skin peeling, or any reaction on the inside of your mouth or nose? unknown Did you need to seek medical attention at a hospital or doctor's office? yes When did it last happen?1967 If all above answers are "NO", may proceed with cephalosporin use.   Procaine Anaphylaxis      Medication List    STOP taking these medications   chlorpheniramine-HYDROcodone 10-8 MG/5ML Suer Commonly known as: Tussionex Pennkinetic ER   cyclobenzaprine 10 MG  tablet Commonly known as: FLEXERIL   doxycycline 100 MG capsule Commonly known as: VIBRAMYCIN   ibuprofen 200 MG tablet Commonly known as: ADVIL   lidocaine 5 % Commonly known  as: Lidoderm   naproxen 500 MG tablet Commonly known as: NAPROSYN   ofloxacin 0.3 % ophthalmic solution Commonly known as: Ocuflox     TAKE these medications   acetaminophen 500 MG tablet Commonly known as: TYLENOL Take 1,000 mg by mouth every 6 (six) hours as needed for mild pain.   aspirin 81 MG chewable tablet Chew 1 tablet (81 mg total) by mouth daily. Start taking on: September 22, 2020   atorvastatin 80 MG tablet Commonly known as: LIPITOR Take 1 tablet (80 mg total) by mouth daily. Start taking on: September 22, 2020   carvedilol 6.25 MG tablet Commonly known as: COREG Take 1 tablet (6.25 mg total) by mouth 2 (two) times daily with a meal.   clopidogrel 75 MG tablet Commonly known as: PLAVIX Take 1 tablet (75 mg total) by mouth daily with breakfast. Start taking on: September 22, 2020   Fluticasone-Salmeterol 250-50 MCG/DOSE Aepb Commonly known as: Advair Diskus Take 1 puff twice daily.  Use regularly, not a rescue inhaler. What changed:   how much to take  how to take this  when to take this  reasons to take this   metFORMIN 500 MG tablet Commonly known as: GLUCOPHAGE Take 1 tablet (500 mg total) by mouth daily with breakfast.   nitroGLYCERIN 0.4 MG SL tablet Commonly known as: NITROSTAT Place 1 tablet (0.4 mg total) under the tongue every 5 (five) minutes x 3 doses as needed for chest pain.          Outstanding Labs/Studies   None  Duration of Discharge Encounter   Greater than 30 minutes including physician time.  Signed, Rosaria Ferries, PA-C 09/21/2020, 2:06 PM   I have seen and examined the patient along with Rosaria Ferries, PA-C.  I have reviewed the chart, notes and new data.  I agree with PA/NP's note.  Key new complaints: no angina. No problems at cath access site Key examination changes: normal CV exam, no hematoma/bruit R radial area Key new findings / data: coronary intervention images and echo reviewed.  Fortunately, no meaningful  change in LV wall motion and EF on follow up echo. Hgb A1c 11.9%.  PLAN: Reinforced critical importance of uninterrupted DAPT for 12 months. Aggressive treatment of risk factors. On hi-dose statin and enrolled in AEGIS study. Carvedilol appears to be tolerated well. Restart metformin and we will try to facilitate Endocrinology evaluation and follow up. Cardiac rehab referral.  Sanda Klein, MD, [place] [phone] 09/21/2020, 2:13 PM

## 2020-09-21 NOTE — Discharge Instructions (Signed)
Radial Site Care  This sheet gives you information about how to care for yourself after your procedure. Your health care provider may also give you more specific instructions. If you have problems or questions, contact your health care provider. What can I expect after the procedure? After the procedure, it is common to have:  Bruising and tenderness at the catheter insertion area. Follow these instructions at home: Medicines  Take over-the-counter and prescription medicines only as told by your health care provider. Insertion site care  Follow instructions from your health care provider about how to take care of your insertion site. Make sure you: ? Wash your hands with soap and water before you change your bandage (dressing). If soap and water are not available, use hand sanitizer. ? Change your dressing as told by your health care provider. ? Leave stitches (sutures), skin glue, or adhesive strips in place. These skin closures may need to stay in place for 2 weeks or longer. If adhesive strip edges start to loosen and curl up, you may trim the loose edges. Do not remove adhesive strips completely unless your health care provider tells you to do that.  Check your insertion site every day for signs of infection. Check for: ? Redness, swelling, or pain. ? Fluid or blood. ? Pus or a bad smell. ? Warmth.  Do not take baths, swim, or use a hot tub until your health care provider approves.  You may shower 24-48 hours after the procedure, or as directed by your health care provider. ? Remove the dressing and gently wash the site with plain soap and water. ? Pat the area dry with a clean towel. ? Do not rub the site. That could cause bleeding.  Do not apply powder or lotion to the site. Activity   For 24 hours after the procedure, or as directed by your health care provider: ? Do not flex or bend the affected arm. ? Do not push or pull heavy objects with the affected arm. ? Do not  drive yourself home from the hospital or clinic. You may drive 24 hours after the procedure unless your health care provider tells you not to. ? Do not operate machinery or power tools.  Do not lift anything that is heavier than 10 lb (4.5 kg), or the limit that you are told, until your health care provider says that it is safe.  Ask your health care provider when it is okay to: ? Return to work or school. ? Resume usual physical activities or sports. ? Resume sexual activity. General instructions  If the catheter site starts to bleed, raise your arm and put firm pressure on the site. If the bleeding does not stop, get help right away. This is a medical emergency.  If you went home on the same day as your procedure, a responsible adult should be with you for the first 24 hours after you arrive home.  Keep all follow-up visits as told by your health care provider. This is important. Contact a health care provider if:  You have a fever.  You have redness, swelling, or yellow drainage around your insertion site. Get help right away if:  You have unusual pain at the radial site.  The catheter insertion area swells very fast.  The insertion area is bleeding, and the bleeding does not stop when you hold steady pressure on the area.  Your arm or hand becomes pale, cool, tingly, or numb. These symptoms may represent a serious problem   that is an emergency. Do not wait to see if the symptoms will go away. Get medical help right away. Call your local emergency services (911 in the [place] Do not drive yourself to the hospital. Summary  After the procedure, it is common to have bruising and tenderness at the site.  Follow instructions from your health care provider about how to take care of your radial site wound. Check the wound every day for signs of infection.  Do not lift anything that is heavier than 10 lb (4.5 kg), or the limit that you are told, until your health care provider says  that it is safe. This information is not intended to replace advice given to you by your health care provider. Make sure you discuss any questions you have with your health care provider. Document Revised: 01/06/2018 Document Reviewed: 01/06/2018 Elsevier Patient Education  2020 [employer] Inc[employer]  

## 2020-09-21 NOTE — Progress Notes (Addendum)
Inpatient Diabetes Program Recommendations  AACE/ADA: New Consensus Statement on Inpatient Glycemic Control (2015)  Target Ranges:  Prepandial:   less than 140 mg/dL      Peak postprandial:   less than 180 mg/dL (1-2 hours)      Critically ill patients:  140 - 180 mg/dL   Lab Results  Component Value Date   GLUCAP 282 (H) 09/21/2020   HGBA1C 11.9 (H) 09/20/2020    Review of Glycemic Control Results for KORTLAND, NICHOLS (MRN [id number]) as of 09/21/2020 10:39  Ref. Range 09/20/2020 07:53 09/20/2020 17:07 09/20/2020 21:38 09/21/2020 08:08  Glucose-Capillary Latest Ref Range: 70 - 99 mg/dL 348 (H) 200 (H) 281 (H) 282 (H)   Diabetes history: DM 2 Outpatient Diabetes medications:  Metformin 500 mg daily Current orders for Inpatient glycemic control:  Novolog moderate tid with meals and HS Inpatient Diabetes Program Recommendations:   A1C indicates poor control of DM.  Please add Lantus 20 units daily while in the hospital and Novolog 4 units tid with meals.    Thanks,  Adah Perl, RN, BC-ADM Inpatient Diabetes Coordinator Pager [phone] (8a-5p)  Addendum:  Spoke with patient regarding elevated A1C.  He states that he has not taken medication for DM in over a year.  He was frustrated with PCP regarding his request for patient to come in every 3 months for medication refills.  Briefly discussed goal blood sugars of 80-130 mg/dL.  Patient states that his blood sugars worsened after starting Seoquel.  Patient states that he knows how to control his blood sugars and understands the importance of glycemic control. Ordered LWWD booklet.

## 2020-09-21 NOTE — Telephone Encounter (Signed)
Referral placed.

## 2020-09-21 NOTE — Research (Signed)
Inclusion/Exclusion Checklist:   Inclusions:   Y N   '[x]'$  $Re'[]'UUX$  Male or male at least 58 years of age  $Re'[x]'Nmx$'[]'$  Evidence of type I (spontaneous) MI (STEMI or NSTEMI) caused by atherothrombotic artery disease as defined by the following:  $RemoveBef'[x]'XkmFTLiNAc$'[]'$  a. Detection of a rise and/or fall in Troponin I or T with at least 1 value about the 99% upper reference limit.     (AND)---  Any 1 or more of the following:   '[x]'$  $Re'[]'UCn$  - symptoms of ischemia (ie, resulting from a primary coronary    artery event)  $Remov'[]'FkwnKT$'[]'$  - New or presumably new significant ST/T wave changes or left bundle branch block.  $Remov'[]'PyqCHi$'[]'$       - Development of pathological Q waves on EKG  $Re'[]'SVP$'[]'$  - Imaging evidence of new loss or viable myocardium or regional wall motion abnormality.  $RemoveBefor'[]'tSuKlBlSxrwj$'[]'$  - ID of intracoronary thrombus by angiography.  $RemoveBefor'[x]'YXZFuwElcUms$'[]'$  No suspicion of acute kidney injury at least 12 hours after angiography OR after first medical contract for subject's not undergoing angiography There must be documented evidence of stable renal function defined as no more than an increase in Serum Creatinine < 0.$RemoveBefore'3mg'vQUeMWvEEDseu$ /dl from pre-contrast serum creatinine value.  (Before _0.84_   12 hrs after _0.70_)    Evidence of multi-vessel coronary artery disease defined as:  $Re'[x]'ojN$'[]'$  A. At least 50% stenosis of theleft main coronary artery or at least 2 epicardial coronary artery territories (LAD, LCx, RCA) on catherization performed during the index hospitalization.   '[]'$  $R'[]'yS$  B. Prior cardiac catherization documenting @ least 50% stenosis of the LM or at least 2 epicardial >1 epicardial artery territories (LAD, LCx, RCA)  $Rem'[]'iMOm$'[]'$  C. Prior PCI and evidence of 50% stenosis of at least 1 epicardial coronary artery territory different from prior revascularized artery territory.   '[]'$  $R'[]'hj$  D. Prior multivessel coronary artery bypass grafting.  $RemoveBe'[x]'yGzUuGqgM$'[]'$  Plus either Established risk factors:  $RemoveB'[x]'OvZjZhde$'[]'$  o On pharmacological treatment for diabetes mellitus                 OR    TWO of the  following  $RemoveBe'[]'unLLHXOfS$'[]'$  o Prior history of MI  $R'[]'Oe$'[]'$  o Age ? 65 years  $Remo'[]'LPzyF$'[]'$  o Peripheral arterial disease defined as meeting at least 1 of the following criteria:  $RemoveBe'[]'wrvsuHYYR$'[]'$          +   Current intermittent claudication or resting limb ischemia and ABI    ?0.90  $Remo'[]'HycUQ$'[]'$          +   History of peripheral revascularization (surgical or percutaneous)  $RemoveBefore'[]'tvUBSFmjYTeCZ$'[]'$          +  History of limb amputation due to PAD  $Re'[]'WnU$'[]'$          +  Angiographic evidence (using computed tomographic angiography, MRA, or invasive angiography or a peripheral artery stenosis ?50%.  $Remo'[]'zriJD$'[]'$  If the male subject without child bearing potential, not breastfeeding, not pregnant, and if of child bearing potential agree to contraception or lifestyle methods to avoid pregnancy? Child-bearing potential (must select all)   ____ not pregnant (by urine or serum hCG AND   ____ willing to use an acceptable method of contraception to avoid pregnancy during the study and for 3 months after last dose of investional product (refer to acceptable methods per protocol)   ____ if breastfeeding, willing to cease breastfeeding Date of pregnancy test: (____ /_____/ ________)  Result  -  or + Not of Child bearing potential (select one)   ____ Age >= 100   ____ Age 40-60 with amenorrhea for at least 1 year with documented evidence of follicle-stimulating hormone level >40 IU/L   ____ Surgically sterile for at least 3 months prior to randomization  $RemoveBefore'[x]'XaIdPrBWlGkAx$'[]'$  Investigator believes that the subject is willing and able to adhere to all protocol requirements.   '[x]'$  $Re'[]'Bof$  Willing to not participate in another investigational study until completion of their final study visit.     Exclusions:  Y N   '[]'$  $R'[x]'Zx$  If these are the reason for MI (pt is excluded)  $RemoveBe'[]'OYksGHPlC$'[x]'$  1. Myocardial necrosis due mismatch between myocardial oxygen demand and supply, usually due to fixed coronary disease with increased demand leading to MI  $R'[]'vl$'[x]'$  2. Cardiac death due to MI  $R'[]'vK$'[x]'$  3. Myocardial  necrosis due to complications from a PCI  $Re'[]'Nec$'[x]'$  4. Myocardial necrosis due to stent thrombosis  $RemoveBef'[]'quoNWbFaDb$'[x]'$  5. Myocardial necrosis due to in stent restenosis as the only etiology  $RemoveB'[]'fWDVyOXE$'[x]'$  6. Myocardial necrosis in the stenting of coronary artery bypass grafting  $RemoveB'[]'Fiijgpbj$'[x]'$  Ongoing hemodynamic instability  $RemoveBefo'[]'CXoIzmGHwXQ$'[x]'$       +  History of NYHA Class III or IV heart failure within the last year  $Rem'[]'VDRv$'[x]'$       +  Killip Class III or IV heart failure  $Remove'[]'CQujhtv$'[x]'$       +  Sustained and/or symptomatic hypotension (SBP <90 mm HG)  $Re'[]'ekv$'[x]'$       +  Known left ventricular ejection fraction of <30%  $Rem'[]'nMLM$'[x]'$  Evidence of hepatobiliary disease as indicated by any 1 or more of the   following at screening:  $RemoveBef'[]'MUTYPAHlFl$'[x]'$       +  Current active hepatic dysfunction or active biliary obstruction  $RemoveBefo'[]'EMuAqveEwol$'[x]'$       +       +  Chronic or prior history of cirrhosis or of infectious / inflammatory hepatitis NOTE: If a patient has a medical history of recovered Hep A, B, or C without evidence of cirrhosis, he/she could be considered for inclusion if there is documented evidence that there is no active infection (ie, antigen negative)  $RemoveBe'[]'SEtmnfJLz$'[x]'$       + Hepatic lab abnormalities: ALT > 3 x ULN or Total bilirubin > 2x ULN at randomization.  $RemoveBeforeD'[]'KyRqEZClPmnHjp$'[x]'$  Severe chronic kidney disease (eGFR of <59ml) or on dialysis  $RemoveB'[]'aiHwIESr$'[x]'$  Plan to undergo scheduled coronary artery bypass graft surgery after randomization, as determined at the time of screening  $RemoveBe'[]'KkryoGhlF$'[x]'$  Known history of allergies to soybeans, peanuts, albumin  $Remove'[]'ggRGFUz$'[x]'$  Body weight <50 kg  $R'[]'jh$'[x]'$  A known history of IgA deficiency or antibodies to IgA  $Re'[]'zHK$'[x]'$  A comorbid condition with an estimated life expectancy of ? 6 months at time of consent  $Remove'[]'XHPWVvU$'[x]'$  Women who are pregnant or breastfeeding at time of randomization  $RemoveBefore'[]'FOnWCgxVvBoXr$'[x]'$  Participated in another interventional clinical study at the time of consent  $Remove'[]'tYsqIZk$'[x]'$  Treatment with anticancer therapy  $Remove'[]'vnbpjQX$'[x]'$  Previously randomized or participated in this study or previously exposed  to CSL112   Component     Latest Ref Rng & Units 09/20/2020 09/20/2020 09/21/2020 09/21/2020         8:10 AM Pre cath   3:59 PM  2:10 AM  2:51 PM 12 hrs post cath   Sodium     135 - 145 mmol/L 141  133 (L) 133 (  L)  Potassium     3.5 - 5.1 mmol/L 4.4  3.9 4.3  Chloride     98 - 111 mmol/L 104  103 100  CO2     22 - 32 mmol/L 27  21 (L) 23  Glucose     70 - 99 mg/dL 114 (H)  254 (H) 197 (H)  BUN     6 - 20 mg/dL $Remove'18  11 17  'bkXCWGc$ Creatinine     0.61 - 1.24 mg/dL 0.84 0.61 0.65 0.70  Calcium     8.9 - 10.3 mg/dL 9.2  8.5 (L) 8.8 (L)  GFR, Est Non African American     >60 mL/min >60 >60 >60   Anion gap     5 - $R'15 10  9 10   'gb$ Component     Latest Ref Rng & Units 09/20/2020  Total Protein     6.5 - 8.1 g/dL 6.1 (L)  Albumin     3.5 - 5.0 g/dL 3.6  AST     15 - 41 U/L 33  ALT     0 - 44 U/L 15  Alkaline Phosphatase     38 - 126 U/L 78  Total Bilirubin     0.3 - 1.2 mg/dL 0.8  Bilirubin, Direct     0.0 - 0.2 mg/dL 0.2  Indirect Bilirubin     0.3 - 0.9 mg/dL 0.6

## 2020-09-21 NOTE — Research (Signed)
Spoke with patient about [facility] study. Reviewed material with him and left him with pamphlet and ICF for him to review. Will go back and see him in about an hour.   Patient wants to do it.   Will draw stat lab due to BMP drawn to early for study.    BMP back normal values

## 2020-09-25 ENCOUNTER — Telehealth (HOSPITAL_COMMUNITY): Payer: Self-pay

## 2020-09-25 NOTE — Telephone Encounter (Signed)
Called pt and advised him that he needs to call a cardiologist and make a follow up appt from having his procedure. Pt understood, also advised pt that his insurance is not in network with [facility] and he will be billed the out of network charges, pt stated that he couldn't afford, advised pt that his insurance will cover [facility], pt stated that he is not driving all around. Advised pt of virtual cardiac rehab pt was interested. Will call pt after his f/u appt with his cardiologist.

## 2020-09-26 NOTE — Research (Signed)
Infusion complete patient doing good getting ready to be discharged. Will see patient back in a week.                                    "CONSENT"   YES     NO   Continuing further Investigational Product and study visits for follow-up? [x]  []   Continuing consent from future biomedical research [x]  []                                   "EVENTS"    YES     NO  AE   (IF YES SEE SOURCE) []  [x]   SAE  (IF YES SEE SOURCE) []  [x]   ENDPOINT   (IF YES SEE SOURCE) []  [x]   REVASCULARIZATION  (IF YES SEE SOURCE) []  [x]   AMPUTATION   (IF YES SEE SOURCE) []  [x]   TROPONIN'S  (IF YES SEE SOURCE) []  [x]    EQ-5D-5L MOBILITY:    I HAVE NO PROBLEMS WALKING [x]   I HAVE SLIGHT PROBLEMS WALKING []   I HAVE MODERATE PROBLEMS WALKING []   I HAVE SEVERE PROBLEMS WALKING []   I AM UNABLE TO WALK  []     SELF-CARE:   I HAVE NO PROBLEMS WASING OR DRESSING MYSELF  [x]   I HAVE SLIGHT PROBLEMS WASHING OR DRESSING MYSELF  []   I HAVE MODERATE PROBLEMS WASHING OR DRESSING MYSELF []   I HAVE SEVERE PROBLEMS WASHING OR DRESSING MYSELF  []   I HAVE SEVERE PROBLEMS WASHING OR DRESSING MYSELF  []   I AM UNABLE TO WASH OR DRESS MYSELF []     USUAL ACTIVITIES: (E.G. WORK/STUDY/HOUSEWORK/FAMILY OR LEISURE ACTIVITIES.    I HAVE NO PROBLEMS DOING MY USUAL ACTIVITIES [x]   I HAVE SLIGHT PROBLEMS DOING MY USUAL ACTIVITIES []   I HAVE MODERATE PROBLEMS DOING MY USUAL ACTIVIITIES []   I HAVE SEVERE PROBLEMS DOING MY USUAL ACTIVITIES []   I AM UNABLE TO DO MY USUAL ACTIVITIES []     PAIN /DISCOMFORT   I HAVE NO PAIN OR DISCOMFORT [x]   I HAVE SLIGHT PAIN OR DISCOMFORT []   I HAVE MODERATE PAIN OR DISCOMFORT []   I HAVE SEVERE PAIN OR DISCOMFORT []   I HAVE EXTREME PAIN OR DISCOMFORT []     ANXIETY/DEPRESSION   I AM NOT ANXIOUS OR DEPRESSED [x]   I AM SLIGHTLY ANXIOUS OR DEPRESSED []   I AM MODERATELY ANXIOUS OR DREPRESSED []   I AM SEVERELY ANXIOUS OR DEPRESSED []   I AM EXTREMELY ANXIOUS OR DEPRESSED []     SCALE OF 0-100 HOW WOULD YOU  RATE TODAY?  0 IS THE WORSE AND 100 IS THE BEST HEALTH YOU CAN IMAGINE: 79      DEMOGRAPHICS:  Patient Name: Jerry Shaffer  Birth Date: Jan 22, 1961  Sex: Male  Race: white  Child Bearing: ? Yes    ? No ? Tubial ligation ? Hysterectomy  ? postmenopausal   Height:  172 cm Weight:  102 kg   Index Procedure:  Onset date of symptoms: 6-Oct-21 Onset of symptoms: 21:00  Date of First contact at hospital: 7-Oct-21 Time of first contact at hospital: 07:54  Admission Date: 7-Oct-21   Discharge Date: 8-Oct-21 Discharge Time: 19:08   Vital Signs: Date 09/20/2020    Time: 07:54 BP: 159/98 Pulse: 95    Concomitant medications: Every visit: ? See med sheet  BMP Pre Contrast IV 09/20/2020 @ 0810  CMP Post Contrast IV 12 hours later: 09/21/2020 @ 1451 Hepatic Panel: 09/20/2020 @ 1559 ALT: 15 Total Bili: 0.8 Direct Bili: 0.2   Medical History:  ? CAD ? Prior MI ? PAD  ? History of Heart Failure ? Moderate to severe valvular dx ? AFib  ? Prior Coronary Revascularization  if YES please select Yes or No below:  CABG ? Yes   ? No           PCI with stent ? Yes   ? No          PCI without stent ? Yes ? No  ? CVA if checked please select one of the following Choose an item.  ? Hypertension  ? Gilberts syndrome ? CKD   ? Hypocholesteremia ? DM ? Smoker        ? eCigarette  Killip Class Stage 1     EQ-5D-3L ?  Future Biomedical Research: Consented ? Yes     ? No If no please date they withdrew consent from biomedical research Click or tap to enter a date.  [facility] Before Start of Infusion: ? Biochemistry panel      ? Hematology     ? Immunogenicity   (30 mins before infusion)   ? Parvovirus  ? FBR sample ? PK/PD sample [facility] End of Infusion:   ? PK/PD Central Blood Draw Time: Before SOI:  _10/07/2020 @ 1643_ After EOI: _10/07/2020 @ 18:45_ Infusion Start Time: 09/21/2020 4:46 PM  Infusion End Time: 09/21/2020 6:40 PM VS 09/21/2020  BP: 109/73 HR: 99

## 2020-09-28 ENCOUNTER — Encounter: Payer: BLUE CROSS/BLUE SHIELD | Admitting: *Deleted

## 2020-09-28 ENCOUNTER — Other Ambulatory Visit: Payer: Self-pay

## 2020-09-28 ENCOUNTER — Encounter (HOSPITAL_COMMUNITY)
Admission: RE | Admit: 2020-09-28 | Discharge: 2020-09-28 | Disposition: A | Discharge status: Home / Self Care | Payer: BLUE CROSS/BLUE SHIELD | Source: Ambulatory Visit | Attending: Internal Medicine | Admitting: Internal Medicine

## 2020-09-28 VITALS — BP 137/91 | HR 91

## 2020-09-28 DIAGNOSIS — Z006 Encounter for examination for normal comparison and control in clinical research program: Secondary | ICD-10-CM

## 2020-09-28 MED ORDER — STUDY - AEGIS II STUDY - PLACEBO OR CSL112 (PI-HILTY)
170.0000 mL | Freq: Once | INTRAVENOUS | Status: AC
  Administered 2020-09-28: 170 mL via INTRAVENOUS
  Filled 2020-09-28: qty 170

## 2020-09-28 NOTE — Research (Signed)
V3 [facility]  Patient doing well.                                   "CONSENT"   YES     NO   Continuing further Investigational Product and study visits for follow-up? [x]  []   Continuing consent from future biomedical research [x]  []                                    "EVENTS"    YES     NO  AE   (IF YES SEE SOURCE) []  [x]   SAE  (IF YES SEE SOURCE) []  [x]   ENDPOINT   (IF YES SEE SOURCE) []  [x]   REVASCULARIZATION  (IF YES SEE SOURCE) []  [x]   AMPUTATION   (IF YES SEE SOURCE) []  [x]   TROPONIN'S  (IF YES SEE SOURCE) []  [x]     Current Outpatient Medications:  .  acetaminophen (TYLENOL) 500 MG tablet, Take 1,000 mg by mouth every 6 (six) hours as needed for mild pain., Disp: , Rfl:  .  aspirin 81 MG chewable tablet, Chew 1 tablet (81 mg total) by mouth daily., Disp: , Rfl:  .  atorvastatin (LIPITOR) 80 MG tablet, Take 1 tablet (80 mg total) by mouth daily., Disp: 30 tablet, Rfl: 11 .  carvedilol (COREG) 3.125 MG tablet, Take 1 tablet (3.125 mg total) by mouth 2 (two) times daily with a meal., Disp: 60 tablet, Rfl: 11 .  clopidogrel (PLAVIX) 75 MG tablet, Take 1 tablet (75 mg total) by mouth daily with breakfast., Disp: 30 tablet, Rfl: 11 .  Fluticasone-Salmeterol (ADVAIR DISKUS) 250-50 MCG/DOSE AEPB, Take 1 puff twice daily.  Use regularly, not a rescue inhaler. (Patient taking differently: Inhale 1 puff into the lungs as needed (shortness of breath). Take 1 puff twice daily.  Use regularly, not a rescue inhaler.), Disp: 60 each, Rfl: 0 .  metFORMIN (GLUCOPHAGE) 500 MG tablet, Take 1 tablet (500 mg total) by mouth daily with breakfast., Disp: 30 tablet, Rfl: 11 .  nitroGLYCERIN (NITROSTAT) 0.4 MG SL tablet, Place 1 tablet (0.4 mg total) under the tongue every 5 (five) minutes x 3 doses as needed for chest pain., Disp: 25 tablet, Rfl: 12

## 2020-10-05 ENCOUNTER — Telehealth: Payer: Self-pay | Admitting: Cardiology

## 2020-10-05 ENCOUNTER — Ambulatory Visit (HOSPITAL_COMMUNITY)
Admission: RE | Admit: 2020-10-05 | Discharge: 2020-10-05 | Disposition: A | Discharge status: Home / Self Care | Payer: BLUE CROSS/BLUE SHIELD | Source: Ambulatory Visit | Attending: Internal Medicine | Admitting: Internal Medicine

## 2020-10-05 ENCOUNTER — Encounter: Payer: BLUE CROSS/BLUE SHIELD | Admitting: *Deleted

## 2020-10-05 VITALS — BP 119/86 | HR 96

## 2020-10-05 DIAGNOSIS — Z006 Encounter for examination for normal comparison and control in clinical research program: Secondary | ICD-10-CM

## 2020-10-05 MED ORDER — STUDY - AEGIS II STUDY - PLACEBO OR CSL112 (PI-HILTY)
170.0000 mL | Freq: Once | INTRAVENOUS | Status: AC
  Administered 2020-10-05: 170 mL via INTRAVENOUS
  Filled 2020-10-05: qty 170

## 2020-10-05 NOTE — Telephone Encounter (Signed)
Called by RN while patient receiving [facility] infusion. Reports he has been having intermittent episodes of chest discomfort at home since leaving on 10/8. Initially was having while at rest but now only seems to happen if he "does too much". Reports episode today of 5/10, but improved a nearly gone while talking with him. States he has also been quite anxious regarding other areas of disease in his heart. I showed him his pictures and reviewed the areas of residual disease. Afterwards he seemed to feel much better. EKG showed SR with no acute ST/T wave changes. Was able to have a long conservation and noted comfortable throughout that time. I reviewed ER precautions and use of SL nitro. He has an upcoming appt for 10/27. I instructed him to make sure he keeps this, but to not hesitate to be seen urgently if needed prior to this.

## 2020-10-05 NOTE — Research (Signed)
AEGIS V4  Patient here to day for visit 4. While patient was receiving his infusion, he told me had some chest pains when he exerts himself and has to take NTG.  I asked when the last time he had chest pains was and he said right now. Patient said it was 5 out of 10. Vitals were stable and EKG completed. Mendel Ryder NP came down to assess patient and chest pains. Chest pains had decreased to a 0 after EKG. EKG was normal sinus rhythm.  Mendel Ryder wrote note in chart. No intervention was taken today after an EKG. Will report an AE for chest pains.                                     "CONSENT"   YES     NO   Continuing further Investigational Product and study visits for follow-up? [x]  []   Continuing consent from future biomedical research [x]  []                                    "EVENTS"    YES     NO  AE   (IF YES SEE SOURCE) [x]  []   SAE  (IF YES SEE SOURCE) []  [x]   ENDPOINT   (IF YES SEE SOURCE) []  [x]   REVASCULARIZATION  (IF YES SEE SOURCE) []  [x]   AMPUTATION   (IF YES SEE SOURCE) []  [x]   TROPONIN'S  (IF YES SEE SOURCE) []  [x]     Current Outpatient Medications:  .  acetaminophen (TYLENOL) 500 MG tablet, Take 1,000 mg by mouth every 6 (six) hours as needed for mild pain., Disp: , Rfl:  .  aspirin 81 MG chewable tablet, Chew 1 tablet (81 mg total) by mouth daily., Disp: , Rfl:  .  atorvastatin (LIPITOR) 80 MG tablet, Take 1 tablet (80 mg total) by mouth daily., Disp: 30 tablet, Rfl: 11 .  carvedilol (COREG) 3.125 MG tablet, Take 1 tablet (3.125 mg total) by mouth 2 (two) times daily with a meal., Disp: 60 tablet, Rfl: 11 .  clopidogrel (PLAVIX) 75 MG tablet, Take 1 tablet (75 mg total) by mouth daily with breakfast., Disp: 30 tablet, Rfl: 11 .  Fluticasone-Salmeterol (ADVAIR DISKUS) 250-50 MCG/DOSE AEPB, Take 1 puff twice daily.  Use regularly, not a rescue inhaler. (Patient taking differently: Inhale 1 puff into the lungs as needed (shortness of breath). Take 1 puff twice daily.  Use regularly,  not a rescue inhaler.), Disp: 60 each, Rfl: 0 .  metFORMIN (GLUCOPHAGE) 500 MG tablet, Take 1 tablet (500 mg total) by mouth daily with breakfast., Disp: 30 tablet, Rfl: 11 .  nitroGLYCERIN (NITROSTAT) 0.4 MG SL tablet, Place 1 tablet (0.4 mg total) under the tongue every 5 (five) minutes x 3 doses as needed for chest pain., Disp: 25 tablet, Rfl: 12

## 2020-10-10 ENCOUNTER — Ambulatory Visit (INDEPENDENT_AMBULATORY_CARE_PROVIDER_SITE_OTHER): Payer: BLUE CROSS/BLUE SHIELD | Admitting: Cardiology

## 2020-10-10 ENCOUNTER — Other Ambulatory Visit: Payer: Self-pay

## 2020-10-10 ENCOUNTER — Encounter: Payer: Self-pay | Admitting: Cardiology

## 2020-10-10 VITALS — BP 130/86 | HR 77 | Ht 68.0 in | Wt 229.4 lb

## 2020-10-10 DIAGNOSIS — G8929 Other chronic pain: Secondary | ICD-10-CM

## 2020-10-10 DIAGNOSIS — E785 Hyperlipidemia, unspecified: Secondary | ICD-10-CM | POA: Insufficient documentation

## 2020-10-10 DIAGNOSIS — Z8673 Personal history of transient ischemic attack (TIA), and cerebral infarction without residual deficits: Secondary | ICD-10-CM | POA: Diagnosis not present

## 2020-10-10 DIAGNOSIS — E1159 Type 2 diabetes mellitus with other circulatory complications: Secondary | ICD-10-CM

## 2020-10-10 DIAGNOSIS — I255 Ischemic cardiomyopathy: Secondary | ICD-10-CM | POA: Diagnosis not present

## 2020-10-10 DIAGNOSIS — Z9861 Coronary angioplasty status: Secondary | ICD-10-CM

## 2020-10-10 DIAGNOSIS — F909 Attention-deficit hyperactivity disorder, unspecified type: Secondary | ICD-10-CM

## 2020-10-10 DIAGNOSIS — I214 Non-ST elevation (NSTEMI) myocardial infarction: Secondary | ICD-10-CM

## 2020-10-10 DIAGNOSIS — M545 Low back pain, unspecified: Secondary | ICD-10-CM

## 2020-10-10 DIAGNOSIS — I2510 Atherosclerotic heart disease of native coronary artery without angina pectoris: Secondary | ICD-10-CM | POA: Insufficient documentation

## 2020-10-10 DIAGNOSIS — M549 Dorsalgia, unspecified: Secondary | ICD-10-CM | POA: Insufficient documentation

## 2020-10-10 DIAGNOSIS — I10 Essential (primary) hypertension: Secondary | ICD-10-CM

## 2020-10-10 MED ORDER — ISOSORBIDE MONONITRATE ER 30 MG PO TB24: 30.0000 mg | ORAL_TABLET | Freq: Every day | ORAL | 3 refills | Status: DC

## 2020-10-10 NOTE — Assessment & Plan Note (Signed)
On high dose statin Rx 

## 2020-10-10 NOTE — Assessment & Plan Note (Signed)
S/P PDA PCI with DES. Residual 70% mLAD, 60% OM2- medical Rx

## 2020-10-10 NOTE — Assessment & Plan Note (Signed)
Calm and appropriate today in the office

## 2020-10-10 NOTE — Assessment & Plan Note (Signed)
I encouraged him to see if he can find a PCP he can work with.  I asked him to limit NSAID use

## 2020-10-10 NOTE — Patient Instructions (Signed)
Medication Instructions:  Start - Isosorbide 30 mg 1 tablet by mouth daily.  *If you need a refill on your cardiac medications before your next appointment, please call your pharmacy*   Lab Work: None ordered.   Testing/Procedures: None ordered.   Follow-Up: At [facility], you and your health needs are our priority.  As part of our continuing mission to provide you with exceptional heart care, we have created designated Provider Care Teams.  These Care Teams include your primary Cardiologist (physician) and Advanced Practice Providers (APPs -  Physician Assistants and Nurse Practitioners) who all work together to provide you with the care you need, when you need it.  We recommend signing up for the patient portal called "MyChart".  Sign up information is provided on this After Visit Summary.  MyChart is used to connect with patients for Virtual Visits (Telemedicine).  Patients are able to view lab/test results, encounter notes, upcoming appointments, etc.  Non-urgent messages can be sent to your provider as well.   To learn more about what you can do with MyChart, go to [email or web address].    Your next appointment:   4-6 week(s)  The format for your next appointment:   In Person  Provider:   You may see Sanda Klein, MD or one of the following Advanced Practice Providers on your designated Care Team:    Almyra Deforest, PA-C  Fabian Sharp, PA-C or   Roby Lofts, [place]

## 2020-10-10 NOTE — Assessment & Plan Note (Signed)
On Glucophage- no PCP

## 2020-10-10 NOTE — Assessment & Plan Note (Signed)
Admitted 09/20/2020 with NSTEMI

## 2020-10-10 NOTE — Progress Notes (Signed)
Cardiology Office Note:    Date:  10/10/2020   ID:  Jerry Shaffer, DOB 04-11-1961, MRN [id number]  PCP:  Patient, No Pcp Per  Cardiologist:  Sanda Klein, MD  Electrophysiologist:  None   Referring MD: No ref. provider found   CC: chest pain  History of Present Illness:    Jerry Shaffer is a 59 y.o. male with a hx of non-insulin-dependent diabetes, prior TIA in 2013, back pain, and bipolar disorder.  He currently has no primary care provider.  He presented 09/20/2020 with chest pain.  He had actually been having pain which he describes as indigestion and heartburn for couple days.  At catheterization he had a PDA occlusion with faint left-to-right collaterals.  He had a 60% OM 2 and a 70% mid LAD.  His ejection fraction at catheterization was 45 to 50%.  He underwent intervention to the PDA with DES.  The plan for his residual coronary disease is medical therapy.  Echocardiogram showed his ejection fraction to be normal 55 to 60%. He was enrolled in [facility] study.   Patient is seen in the office today for follow-up.  He complains of left lateral chest pain.  He feels this is secondary to his residual coronary disease though I told him his symptoms sounded atypical for angina. He has been taking SL NTG PRN for this.    He has issues with chronic back pain.  He has been taking over-the-counter NSAIDs, Advil, as well as Tylenol.  I suggested he stop his over-the-counter NSAID.  He can take Advil as needed but I encouraged him to try and get by with Tylenol.  He also has a history of migraines and asked about taking Imitrex which I discouraged secondary to possible coronary spasm.  He has had some palpitations at night which sound like skipped beats as opposed to a sustained tachycardia.  Past Medical History:  Diagnosis Date  . Bipolar 1 disorder ([place])   . Coronary artery disease   . Diabetes mellitus   . Dyslipidemia   . Emphysema   . Mental disorder    BIPOLAR DISORDER  . Migraine    . NSTEMI (non-ST elevated myocardial infarction) ([place]) 09/20/2020  . PONV (postoperative nausea and vomiting)   . Renal disorder   . Shortness of breath   . Stroke ([place])   . Transient ischemic attack (TIA)     Past Surgical History:  Procedure Laterality Date  . CORONARY ANGIOGRAPHY  09/20/2020  . CORONARY STENT INTERVENTION  09/20/2020   CORONARY STENT INTERVENTION  . CORONARY STENT INTERVENTION N/A 09/20/2020   Procedure: CORONARY STENT INTERVENTION;  Surgeon: Belva Crome, MD;  Location: [facility];  Service: Cardiovascular;  Laterality: N/A;  . HEMORROIDECTOMY    . KNEE ARTHROSCOPY    . LEFT HEART CATH AND CORONARY ANGIOGRAPHY N/A 09/20/2020   Procedure: LEFT HEART CATH AND CORONARY ANGIOGRAPHY;  Surgeon: Belva Crome, MD;  Location: [facility];  Service: Cardiovascular;  Laterality: N/A;  . LUMBAR [place]    . TONSILLECTOMY AND ADENOIDECTOMY      Current Medications: Current Meds  Medication Sig  . acetaminophen (TYLENOL) 500 MG tablet Take 500 mg by mouth every 6 (six) hours as needed.  Marland Kitchen aspirin 81 MG chewable tablet Chew 1 tablet (81 mg total) by mouth daily.  Marland Kitchen atorvastatin (LIPITOR) 80 MG tablet Take 1 tablet (80 mg total) by mouth daily.  . carvedilol (COREG) 3.125 MG tablet Take 1 tablet (  3.125 mg total) by mouth 2 (two) times daily with a meal.  . clopidogrel (PLAVIX) 75 MG tablet Take 1 tablet (75 mg total) by mouth daily with breakfast.  . Fluticasone-Salmeterol (ADVAIR DISKUS) 250-50 MCG/DOSE AEPB Take 1 puff twice daily.  Use regularly, not a rescue inhaler. (Patient taking differently: Inhale 1 puff into the lungs as needed (shortness of breath). Take 1 puff twice daily.  Use regularly, not a rescue inhaler.)  . Ibuprofen (ADVIL) 200 MG CAPS Take by mouth.  . metFORMIN (GLUCOPHAGE) 500 MG tablet Take 1 tablet (500 mg total) by mouth daily with breakfast.  . nitroGLYCERIN (NITROSTAT) 0.4 MG SL tablet Place 1 tablet (0.4 mg total) under the  tongue every 5 (five) minutes x 3 doses as needed for chest pain.     Allergies:   Bee venom, Penicillins, and Procaine   Social History   Socioeconomic History  . Marital status: Single    Spouse name: Not on file  . Number of children: Not on file  . Years of education: Not on file  . Highest education level: Not on file  Occupational History  . Occupation: Financial controller: [employer]  Tobacco Use  . Smoking status: Former Smoker    Years: 30.00    Types: Cigarettes  . Smokeless tobacco: Current User  Vaping Use  . Vaping Use: Never used  Substance and Sexual Activity  . Alcohol use: No  . Drug use: No  . Sexual activity: Not Currently  Other Topics Concern  . Not on file  Social History Narrative  . Not on file   Social Determinants of Health   Financial Resource Strain:   . Difficulty of Paying Living Expenses: Not on file  Food Insecurity:   . Worried About Charity fundraiser in the Last Year: Not on file  . Ran Out of Food in the Last Year: Not on file  Transportation Needs:   . Lack of Transportation (Medical): Not on file  . Lack of Transportation (Non-Medical): Not on file  Physical Activity:   . Days of Exercise per Week: Not on file  . Minutes of Exercise per Session: Not on file  Stress:   . Feeling of Stress : Not on file  Social Connections:   . Frequency of Communication with Friends and Family: Not on file  . Frequency of Social Gatherings with Friends and Family: Not on file  . Attends Religious Services: Not on file  . Active Member of Clubs or Organizations: Not on file  . Attends Archivist Meetings: Not on file  . Marital Status: Not on file     Family History: The patient's family history includes Bipolar disorder in an other family member; CAD (age of onset: 20) in his mother; Coronary artery disease in an other family member; Diabetes type II in an other family member; Lung cancer in his father.  ROS:    Please see the history of present illness.     All other systems reviewed and are negative.  EKGs/Labs/Other Studies Reviewed:    The following studies were reviewed today: Cath/ PCI 09/20/2020- Echo 09/20/2020- IMPRESSIONS    1. Left ventricular ejection fraction, by estimation, is 55 to 60%. The  left ventricle has normal function. The left ventricle has no regional  wall motion abnormalities. There is severe left ventricular hypertrophy.  Left ventricular diastolic parameters  are consistent with Grade I diastolic dysfunction (impaired relaxation).  2. Right  ventricular systolic function is normal. The right ventricular  size is normal. Tricuspid regurgitation signal is inadequate for assessing  PA pressure.  3. The mitral valve is normal in structure. No evidence of mitral valve  regurgitation. No evidence of mitral stenosis.  4. The aortic valve is tricuspid. Aortic valve regurgitation is not  visualized. No aortic stenosis is present.  5. Aortic dilatation noted. There is mild dilatation of the aortic root,  measuring 38 mm.  6. The inferior vena cava is normal in size with greater than 50%  respiratory variability, suggesting right atrial pressure of 3 mmHg.   EKG:  EKG is ordered today.  The ekg ordered today demonstrates NSR, HR 77  Recent Labs: 09/20/2020: ALT 15; B Natriuretic Peptide 136.7; TSH 0.632 09/21/2020: BUN 17; Creatinine, Ser 0.70; Hemoglobin 13.9; Platelets 224; Potassium 4.3; Sodium 133  Recent Lipid Panel    Component Value Date/Time   CHOL 161 09/20/2020 1559   TRIG 124 09/20/2020 1559   HDL 31 (L) 09/20/2020 1559   CHOLHDL 5.2 09/20/2020 1559   VLDL 25 09/20/2020 1559   LDLCALC 105 (H) 09/20/2020 1559    Physical Exam:    VS:  BP 130/86   Pulse 77   Ht 5\' 8"  (1.727 m)   Wt 229 lb 6.4 oz (104.1 kg)   BMI 34.88 kg/m     Wt Readings from Last 3 Encounters:  10/10/20 229 lb 6.4 oz (104.1 kg)  10/05/20 226 lb 1.6 oz (102.6 kg)   09/21/20 225 lb 9.6 oz (102.3 kg)     GEN: Overweight Caucasian male, in no acute distress HEENT: Normal NECK: No JVD; No carotid bruits CARDIAC: RRR, no murmurs, rubs, gallops RESPIRATORY:  Clear to auscultation without rales, wheezing or rhonchi  ABDOMEN: Soft, non-tender, non-distended MUSCULOSKELETAL:  No edema; No deformity Rt radial site without hematoma or ecchymosis SKIN: Warm and dry NEUROLOGIC:  Alert and oriented x 3 PSYCHIATRIC:  Normal affect   ASSESSMENT:    NSTEMI (non-ST elevated myocardial infarction) ([place]) Admitted 09/20/2020 with NSTEMI  CAD S/P percutaneous coronary angioplasty S/P PDA PCI with DES. Residual 70% mLAD, 60% OM2- medical Rx  HTN (hypertension) Controlled  Dyslipidemia, goal LDL below 70 On high dose statin Rx  Type II diabetes mellitus ([place]) On Glucophage- no PCP  Back pain I encouraged him to see if he can find a PCP he can work with.  I asked him to limit NSAID use  Attention deficit hyperactivity disorder (ADHD) Calm and appropriate today in the office  PLAN:    Add Imdur 30 mg- OV f/u in 4-6 weeks.  Information on PCPs provided.  Medication Adjustments/Labs and Tests Ordered: Current medicines are reviewed at length with the patient today.  Concerns regarding medicines are outlined above.  Orders Placed This Encounter  Procedures  . EKG 12-Lead   Meds ordered this encounter  Medications  . isosorbide mononitrate (IMDUR) 30 MG 24 hr tablet    Sig: Take 1 tablet (30 mg total) by mouth daily.    Dispense:  90 tablet    Refill:  3    Patient Instructions  Medication Instructions:  Start - Isosorbide 30 mg 1 tablet by mouth daily.  *If you need a refill on your cardiac medications before your next appointment, please call your pharmacy*   Lab Work: None ordered.   Testing/Procedures: None ordered.   Follow-Up: At [facility], you and your health needs are our priority.  As part of our continuing mission to  provide you with exceptional heart care, we have created designated Provider Care Teams.  These Care Teams include your primary Cardiologist (physician) and Advanced Practice Providers (APPs -  Physician Assistants and Nurse Practitioners) who all work together to provide you with the care you need, when you need it.  We recommend signing up for the patient portal called "MyChart".  Sign up information is provided on this After Visit Summary.  MyChart is used to connect with patients for Virtual Visits (Telemedicine).  Patients are able to view lab/test results, encounter notes, upcoming appointments, etc.  Non-urgent messages can be sent to your provider as well.   To learn more about what you can do with MyChart, go to [email or web address].    Your next appointment:   4-6 week(s)  The format for your next appointment:   In Person  Provider:   You may see Sanda Klein, MD or one of the following Advanced Practice Providers on your designated Care Team:    Almyra Deforest, PA-C  Fabian Sharp, [place] or   Roby Lofts, PA-C     Signed, Kerin Ransom, [place]  10/10/2020 9:33 AM    [place]

## 2020-10-10 NOTE — Assessment & Plan Note (Signed)
Controlled.  

## 2020-10-12 ENCOUNTER — Ambulatory Visit (HOSPITAL_COMMUNITY)
Admission: RE | Admit: 2020-10-12 | Discharge: 2020-10-12 | Disposition: A | Discharge status: Home / Self Care | Payer: BLUE CROSS/BLUE SHIELD | Source: Ambulatory Visit | Attending: Internal Medicine | Admitting: Internal Medicine

## 2020-10-12 ENCOUNTER — Encounter: Payer: BLUE CROSS/BLUE SHIELD | Admitting: *Deleted

## 2020-10-12 ENCOUNTER — Other Ambulatory Visit: Payer: Self-pay

## 2020-10-12 VITALS — BP 121/75 | HR 78

## 2020-10-12 DIAGNOSIS — Z006 Encounter for examination for normal comparison and control in clinical research program: Secondary | ICD-10-CM

## 2020-10-12 MED ORDER — STUDY - AEGIS II STUDY - PLACEBO OR CSL112 (PI-HILTY)
170.0000 mL | Freq: Once | INTRAVENOUS | Status: AC
  Administered 2020-10-12: 170 mL via INTRAVENOUS
  Filled 2020-10-12: qty 170

## 2020-10-12 NOTE — Research (Signed)
AEGIS V5  Patient doing well, no complaints. They did add isosorbide to his med regimen, will update med list.                                     "CONSENT"   YES     NO   Continuing further Investigational Product and study visits for follow-up? [x]  []   Continuing consent from future biomedical research [x]  []                                    "EVENTS"    YES     NO  AE   (IF YES SEE SOURCE) []  [x]   SAE  (IF YES SEE SOURCE) []  [x]   ENDPOINT   (IF YES SEE SOURCE) []  [x]   REVASCULARIZATION  (IF YES SEE SOURCE) []  [x]   AMPUTATION   (IF YES SEE SOURCE) []  [x]   TROPONIN'S  (IF YES SEE SOURCE) []  [x]    PK drawn at this visit      Current Outpatient Medications:  .  acetaminophen (TYLENOL) 500 MG tablet, Take 500 mg by mouth every 6 (six) hours as needed., Disp: , Rfl:  .  aspirin 81 MG chewable tablet, Chew 1 tablet (81 mg total) by mouth daily., Disp: , Rfl:  .  atorvastatin (LIPITOR) 80 MG tablet, Take 1 tablet (80 mg total) by mouth daily., Disp: 30 tablet, Rfl: 11 .  carvedilol (COREG) 3.125 MG tablet, Take 1 tablet (3.125 mg total) by mouth 2 (two) times daily with a meal., Disp: 60 tablet, Rfl: 11 .  clopidogrel (PLAVIX) 75 MG tablet, Take 1 tablet (75 mg total) by mouth daily with breakfast., Disp: 30 tablet, Rfl: 11 .  Fluticasone-Salmeterol (ADVAIR DISKUS) 250-50 MCG/DOSE AEPB, Take 1 puff twice daily.  Use regularly, not a rescue inhaler. (Patient taking differently: Inhale 1 puff into the lungs as needed (shortness of breath). Take 1 puff twice daily.  Use regularly, not a rescue inhaler.), Disp: 60 each, Rfl: 0 .  Ibuprofen (ADVIL) 200 MG CAPS, Take by mouth., Disp: , Rfl:  .  isosorbide mononitrate (IMDUR) 30 MG 24 hr tablet, Take 1 tablet (30 mg total) by mouth daily., Disp: 90 tablet, Rfl: 3 .  metFORMIN (GLUCOPHAGE) 500 MG tablet, Take 1 tablet (500 mg total) by mouth daily with breakfast., Disp: 30 tablet, Rfl: 11 .  nitroGLYCERIN (NITROSTAT) 0.4 MG SL tablet, Place 1  tablet (0.4 mg total) under the tongue every 5 (five) minutes x 3 doses as needed for chest pain., Disp: 25 tablet, Rfl: 12

## 2020-10-19 ENCOUNTER — Encounter: Payer: Self-pay | Admitting: *Deleted

## 2020-10-19 VITALS — BP 119/72 | HR 85 | Wt 232.0 lb

## 2020-10-19 DIAGNOSIS — Z006 Encounter for examination for normal comparison and control in clinical research program: Secondary | ICD-10-CM

## 2020-10-19 NOTE — Research (Signed)
AEGIS V6  Patient doing well. No med changes.  Dr Lia Foyer saw patient in clinic today for exam.  Central labs drawn and sent out today.                                   "CONSENT"   YES     NO   Continuing further Investigational Product and study visits for follow-up? [x]  []   Continuing consent from future biomedical research [x]  []                                    "EVENTS"    YES     NO  AE   (IF YES SEE SOURCE) []  [x]   SAE  (IF YES SEE SOURCE) []  [x]   ENDPOINT   (IF YES SEE SOURCE) []  [x]   REVASCULARIZATION  (IF YES SEE SOURCE) []  [x]   AMPUTATION   (IF YES SEE SOURCE) []  [x]   TROPONIN'S  (IF YES SEE SOURCE) []  [x]         Current Outpatient Medications:    acetaminophen (TYLENOL) 500 MG tablet, Take 500 mg by mouth every 6 (six) hours as needed., Disp: , Rfl:    aspirin 81 MG chewable tablet, Chew 1 tablet (81 mg total) by mouth daily., Disp: , Rfl:    atorvastatin (LIPITOR) 80 MG tablet, Take 1 tablet (80 mg total) by mouth daily., Disp: 30 tablet, Rfl: 11   carvedilol (COREG) 3.125 MG tablet, Take 1 tablet (3.125 mg total) by mouth 2 (two) times daily with a meal., Disp: 60 tablet, Rfl: 11   clopidogrel (PLAVIX) 75 MG tablet, Take 1 tablet (75 mg total) by mouth daily with breakfast., Disp: 30 tablet, Rfl: 11   Fluticasone-Salmeterol (ADVAIR DISKUS) 250-50 MCG/DOSE AEPB, Take 1 puff twice daily.  Use regularly, not a rescue inhaler. (Patient taking differently: Inhale 1 puff into the lungs as needed (shortness of breath). Take 1 puff twice daily.  Use regularly, not a rescue inhaler.), Disp: 60 each, Rfl: 0   Ibuprofen (ADVIL) 200 MG CAPS, Take by mouth., Disp: , Rfl:    isosorbide mononitrate (IMDUR) 30 MG 24 hr tablet, Take 1 tablet (30 mg total) by mouth daily., Disp: 90 tablet, Rfl: 3   metFORMIN (GLUCOPHAGE) 500 MG tablet, Take 1 tablet (500 mg total) by mouth daily with breakfast., Disp: 30 tablet, Rfl: 11   nitroGLYCERIN (NITROSTAT) 0.4 MG SL tablet, Place 1  tablet (0.4 mg total) under the tongue every 5 (five) minutes x 3 doses as needed for chest pain., Disp: 25 tablet, Rfl: 12

## 2020-10-22 NOTE — Research (Signed)
Patient seen in research clinic in follow up of AEGIS II.  He has been stable.  Continues to have some chest discomfort and he was seen in the CV clinic and long acting nitrates added.  It perhaps helps.  The chest pain is lateral, and seems more akin to stress.  Still vaping.  Risk factors discussed in detail.   Purpose and goals of study reviewed.  Patient did not have issues with infusions, and seems stable.   Exam VSS as noted with SPO2 98%.  BP controlled. P 85.  Lungs clear.  Cardiac rhythm regular without significant murmur.  Abdomen soft.  No extremity edema. Neuro non focal.    ECG done recently in clinic.   Patient will continue to follow up in the outpatient cardiology clinic.  Loretha Brasil. Lia Foyer, MD, [place] Director, [facility]

## 2020-11-02 ENCOUNTER — Ambulatory Visit: Payer: BLUE CROSS/BLUE SHIELD | Admitting: Endocrinology

## 2020-11-02 ENCOUNTER — Encounter: Payer: Self-pay | Admitting: Endocrinology

## 2020-11-02 ENCOUNTER — Other Ambulatory Visit: Payer: Self-pay | Admitting: *Deleted

## 2020-11-02 ENCOUNTER — Other Ambulatory Visit: Payer: Self-pay

## 2020-11-02 VITALS — BP 158/90 | HR 73 | Ht 68.0 in | Wt 235.0 lb

## 2020-11-02 DIAGNOSIS — E1159 Type 2 diabetes mellitus with other circulatory complications: Secondary | ICD-10-CM | POA: Diagnosis not present

## 2020-11-02 MED ORDER — METFORMIN HCL ER 500 MG PO TB24: 2000.0000 mg | ORAL_TABLET | Freq: Every day | ORAL | 3 refills | Status: DC

## 2020-11-02 MED ORDER — TRESIBA FLEXTOUCH 100 UNIT/ML ~~LOC~~ SOPN: 20.0000 [IU] | PEN_INJECTOR | Freq: Every day | SUBCUTANEOUS | 3 refills | Status: DC

## 2020-11-02 MED ORDER — INSULIN PEN NEEDLE 32G X 4 MM MISC: 0 refills | Status: DC

## 2020-11-02 MED ORDER — EPINEPHRINE 0.3 MG/0.3ML IJ SOAJ: 0.3000 mg | INTRAMUSCULAR | 0 refills | Status: DC | PRN

## 2020-11-02 NOTE — Patient Instructions (Addendum)
Your blood pressure is high today.  Please see your heart doctor soon, to have it rechecked. good diet and exercise significantly improve the control of your diabetes.  please let me know if you wish to be referred to a dietician.  high blood sugar is very risky to your health.  you should see an eye doctor and dentist every year.  It is very important to get all recommended vaccinations.  Controlling your blood pressure and cholesterol drastically reduces the damage diabetes does to your body.  Those who smoke should quit.  Please discuss these with your doctor.  check your blood sugar twice a day.  vary the time of day when you check, between before the 3 meals, and at bedtime.  also check if you have symptoms of your blood sugar being too high or too low.  please keep a record of the readings and bring it to your next appointment here (or you can bring the meter itself).  You can write it on any piece of paper.  please call us sooner if your blood sugar goes below 70, or if you have a lot of readings over 200.  We will need to take this complex situation in stages.  I have sent 2 prescriptions to your pharmacy: to increase the metformin, and to add "[place]."  Please call or message [place] next week, to tell us how the blood sugar is doing.   Please come back for a follow-up appointment in 2 months.

## 2020-11-02 NOTE — Progress Notes (Signed)
Subjective:    Patient ID: Jerry Shaffer, male    DOB: February 08, 1961, 59 y.o.   MRN: [id number]  HPI pt is referred by  Dr Sallyanne Kuster, for diabetes.  DM was dx'ed in 2013; he says this was precip by Seroquel.  it is complicated by CAD and TIA; he took insulin 2016-2017; pt says his diet is improved, but exercise is poor; he has never had pancreatitis, pancreatic surgery, severe hypoglycemia or DKA.  Main symptom is frequent urination.  He has been off medication x several years, until metformin was resumed 1 month ago.  cbg's are in the 300's.  He works [employer], 3rd shift.   Past Medical History:  Diagnosis Date  . Bipolar 1 disorder ([place])   . Coronary artery disease   . Diabetes mellitus   . Dyslipidemia   . Emphysema   . Mental disorder    BIPOLAR DISORDER  . Migraine   . NSTEMI (non-ST elevated myocardial infarction) ([place]) 09/20/2020  . PONV (postoperative nausea and vomiting)   . Renal disorder   . Shortness of breath   . Stroke ([place])   . Transient ischemic attack (TIA)     Past Surgical History:  Procedure Laterality Date  . CORONARY ANGIOGRAPHY  09/20/2020  . CORONARY STENT INTERVENTION  09/20/2020   CORONARY STENT INTERVENTION  . CORONARY STENT INTERVENTION N/A 09/20/2020   Procedure: CORONARY STENT INTERVENTION;  Surgeon: Belva Crome, MD;  Location: [facility];  Service: Cardiovascular;  Laterality: N/A;  . HEMORROIDECTOMY    . KNEE ARTHROSCOPY    . LEFT HEART CATH AND CORONARY ANGIOGRAPHY N/A 09/20/2020   Procedure: LEFT HEART CATH AND CORONARY ANGIOGRAPHY;  Surgeon: Belva Crome, MD;  Location: [facility];  Service: Cardiovascular;  Laterality: N/A;  . LUMBAR [place]    . TONSILLECTOMY AND ADENOIDECTOMY      Social History   Socioeconomic History  . Marital status: Single    Spouse name: Not on file  . Number of children: Not on file  . Years of education: Not on file  . Highest education level: Not on file  Occupational History  .  Occupation: Financial controller: [employer]  Tobacco Use  . Smoking status: Former Smoker    Years: 30.00    Types: Cigarettes  . Smokeless tobacco: Current User  Vaping Use  . Vaping Use: Never used  Substance and Sexual Activity  . Alcohol use: No  . Drug use: No  . Sexual activity: Not Currently  Other Topics Concern  . Not on file  Social History Narrative  . Not on file   Social Determinants of Health   Financial Resource Strain:   . Difficulty of Paying Living Expenses: Not on file  Food Insecurity:   . Worried About Charity fundraiser in the Last Year: Not on file  . Ran Out of Food in the Last Year: Not on file  Transportation Needs:   . Lack of Transportation (Medical): Not on file  . Lack of Transportation (Non-Medical): Not on file  Physical Activity:   . Days of Exercise per Week: Not on file  . Minutes of Exercise per Session: Not on file  Stress:   . Feeling of Stress : Not on file  Social Connections:   . Frequency of Communication with Friends and Family: Not on file  . Frequency of Social Gatherings with Friends and Family: Not on file  . Attends  Religious Services: Not on file  . Active Member of Clubs or Organizations: Not on file  . Attends Archivist Meetings: Not on file  . Marital Status: Not on file  Intimate Partner Violence:   . Fear of Current or Ex-Partner: Not on file  . Emotionally Abused: Not on file  . Physically Abused: Not on file  . Sexually Abused: Not on file    Current Outpatient Medications on File Prior to Visit  Medication Sig Dispense Refill  . acetaminophen (TYLENOL) 500 MG tablet Take 500 mg by mouth every 6 (six) hours as needed.    Marland Kitchen aspirin 81 MG chewable tablet Chew 1 tablet (81 mg total) by mouth daily.    Marland Kitchen atorvastatin (LIPITOR) 80 MG tablet Take 1 tablet (80 mg total) by mouth daily. 30 tablet 11  . carvedilol (COREG) 3.125 MG tablet Take 1 tablet (3.125 mg total) by mouth 2 (two) times  daily with a meal. 60 tablet 11  . clopidogrel (PLAVIX) 75 MG tablet Take 1 tablet (75 mg total) by mouth daily with breakfast. 30 tablet 11  . Fluticasone-Salmeterol (ADVAIR DISKUS) 250-50 MCG/DOSE AEPB Take 1 puff twice daily.  Use regularly, not a rescue inhaler. (Patient taking differently: Inhale 1 puff into the lungs as needed (shortness of breath). Take 1 puff twice daily.  Use regularly, not a rescue inhaler.) 60 each 0  . Ibuprofen (ADVIL) 200 MG CAPS Take by mouth.    . isosorbide mononitrate (IMDUR) 30 MG 24 hr tablet Take 1 tablet (30 mg total) by mouth daily. 90 tablet 3  . nitroGLYCERIN (NITROSTAT) 0.4 MG SL tablet Place 1 tablet (0.4 mg total) under the tongue every 5 (five) minutes x 3 doses as needed for chest pain. 25 tablet 12   No current facility-administered medications on file prior to visit.    Allergies  Allergen Reactions  . Bee Venom Anaphylaxis  . Penicillins Anaphylaxis    Did it involve swelling of the face/tongue/throat, SOB, or low BP? yes Did it involve sudden or severe rash/hives, skin peeling, or any reaction on the inside of your mouth or nose? unknown Did you need to seek medical attention at a hospital or doctor's office? yes When did it last happen?1967 If all above answers are "NO", may proceed with cephalosporin use.   . Procaine Anaphylaxis    Family History  Problem Relation Age of Onset  . Diabetes type II Other   . Coronary artery disease Other   . Bipolar disorder Other   . Lung cancer Father   . CAD Mother 75    BP (!) 158/90   Pulse 73   Ht 5\' 8"  (1.727 m)   Wt 235 lb (106.6 kg)   SpO2 97%   BMI 35.73 kg/m     Review of Systems denies blurry vision, chest pain, sob, and n/v.  Depression is less recently.      Objective:   Physical Exam VITAL SIGNS:  See vs page GENERAL: no distress Pulses: dorsalis pedis intact bilat.   MSK: no deformity of the feet CV: trace bilat leg edema Skin:  no ulcer on the feet.  normal  color and temp on the feet. Neuro: sensation is intact to touch on the feet Ext: there is bilateral onychomycosis of the toenails  Lab Results  Component Value Date   HGBA1C 11.9 (H) 09/20/2020   Lab Results  Component Value Date   CREATININE 0.70 09/21/2020   BUN 17 09/21/2020  NA 133 (L) 09/21/2020   K 4.3 09/21/2020   CL 100 09/21/2020   CO2 23 09/21/2020   Lab Results  Component Value Date   TSH 0.632 09/20/2020    I have reviewed outside records, and summarized: Pt was noted to have elevated A1c, and referred here.  Metformin was rx'ed.  Other risk factors and back pain were also addressed.  Pt was advised to find a primary care provider     Assessment & Plan:  Type 2 DM, with CAD, new to me: uncontrolled  Patient Instructions  Your blood pressure is high today.  Please see your heart doctor soon, to have it rechecked. good diet and exercise significantly improve the control of your diabetes.  please let me know if you wish to be referred to a dietician.  high blood sugar is very risky to your health.  you should see an eye doctor and dentist every year.  It is very important to get all recommended vaccinations.  Controlling your blood pressure and cholesterol drastically reduces the damage diabetes does to your body.  Those who smoke should quit.  Please discuss these with your doctor.  check your blood sugar twice a day.  vary the time of day when you check, between before the 3 meals, and at bedtime.  also check if you have symptoms of your blood sugar being too high or too low.  please keep a record of the readings and bring it to your next appointment here (or you can bring the meter itself).  You can write it on any piece of paper.  please call us sooner if your blood sugar goes below 70, or if you have a lot of readings over 200.  We will need to take this complex situation in stages.  I have sent 2 prescriptions to your pharmacy: to increase the metformin, and to add  "[place]."  Please call or message [place] next week, to tell us how the blood sugar is doing.   Please come back for a follow-up appointment in 2 months.

## 2020-11-06 ENCOUNTER — Ambulatory Visit: Payer: BLUE CROSS/BLUE SHIELD | Admitting: General Practice

## 2020-11-09 ENCOUNTER — Other Ambulatory Visit: Payer: Self-pay

## 2020-11-09 ENCOUNTER — Emergency Department (HOSPITAL_COMMUNITY): Payer: BLUE CROSS/BLUE SHIELD

## 2020-11-09 ENCOUNTER — Encounter (HOSPITAL_COMMUNITY): Payer: Self-pay

## 2020-11-09 ENCOUNTER — Emergency Department (HOSPITAL_COMMUNITY)
Admission: EM | Admit: 2020-11-09 | Discharge: 2020-11-09 | Disposition: A | Discharge status: Home / Self Care | Payer: BLUE CROSS/BLUE SHIELD | Attending: Emergency Medicine | Admitting: Emergency Medicine

## 2020-11-09 DIAGNOSIS — Z7984 Long term (current) use of oral hypoglycemic drugs: Secondary | ICD-10-CM | POA: Diagnosis not present

## 2020-11-09 DIAGNOSIS — J209 Acute bronchitis, unspecified: Secondary | ICD-10-CM | POA: Insufficient documentation

## 2020-11-09 DIAGNOSIS — I2510 Atherosclerotic heart disease of native coronary artery without angina pectoris: Secondary | ICD-10-CM | POA: Diagnosis not present

## 2020-11-09 DIAGNOSIS — E1165 Type 2 diabetes mellitus with hyperglycemia: Secondary | ICD-10-CM | POA: Diagnosis not present

## 2020-11-09 DIAGNOSIS — Z87891 Personal history of nicotine dependence: Secondary | ICD-10-CM | POA: Diagnosis not present

## 2020-11-09 DIAGNOSIS — Z794 Long term (current) use of insulin: Secondary | ICD-10-CM | POA: Insufficient documentation

## 2020-11-09 DIAGNOSIS — J189 Pneumonia, unspecified organism: Secondary | ICD-10-CM | POA: Diagnosis not present

## 2020-11-09 DIAGNOSIS — Z7982 Long term (current) use of aspirin: Secondary | ICD-10-CM | POA: Diagnosis not present

## 2020-11-09 DIAGNOSIS — Z20822 Contact with and (suspected) exposure to covid-19: Secondary | ICD-10-CM | POA: Insufficient documentation

## 2020-11-09 DIAGNOSIS — J40 Bronchitis, not specified as acute or chronic: Secondary | ICD-10-CM | POA: Diagnosis not present

## 2020-11-09 DIAGNOSIS — R21 Rash and other nonspecific skin eruption: Secondary | ICD-10-CM | POA: Diagnosis not present

## 2020-11-09 DIAGNOSIS — Z8673 Personal history of transient ischemic attack (TIA), and cerebral infarction without residual deficits: Secondary | ICD-10-CM | POA: Diagnosis not present

## 2020-11-09 DIAGNOSIS — J181 Lobar pneumonia, unspecified organism: Secondary | ICD-10-CM | POA: Insufficient documentation

## 2020-11-09 DIAGNOSIS — R042 Hemoptysis: Secondary | ICD-10-CM | POA: Diagnosis not present

## 2020-11-09 LAB — CBC WITH DIFFERENTIAL/PLATELET
Abs Immature Granulocytes: 0.02 10*3/uL (ref 0.00–0.07)
Basophils Absolute: 0.1 10*3/uL (ref 0.0–0.1)
Basophils Relative: 1 %
Eosinophils Absolute: 0.2 10*3/uL (ref 0.0–0.5)
Eosinophils Relative: 3 %
HCT: 43 % (ref 39.0–52.0)
Hemoglobin: 13.7 g/dL (ref 13.0–17.0)
Immature Granulocytes: 0 %
Lymphocytes Relative: 19 %
Lymphs Abs: 1.5 10*3/uL (ref 0.7–4.0)
MCH: 28.2 pg (ref 26.0–34.0)
MCHC: 31.9 g/dL (ref 30.0–36.0)
MCV: 88.5 fL (ref 80.0–100.0)
Monocytes Absolute: 0.5 10*3/uL (ref 0.1–1.0)
Monocytes Relative: 6 %
Neutro Abs: 5.7 10*3/uL (ref 1.7–7.7)
Neutrophils Relative %: 71 %
Platelets: 230 10*3/uL (ref 150–400)
RBC: 4.86 MIL/uL (ref 4.22–5.81)
RDW: 13.2 % (ref 11.5–15.5)
WBC: 7.9 10*3/uL (ref 4.0–10.5)
nRBC: 0 % (ref 0.0–0.2)

## 2020-11-09 LAB — BASIC METABOLIC PANEL
Anion gap: 9 (ref 5–15)
BUN: 16 mg/dL (ref 6–20)
CO2: 23 mmol/L (ref 22–32)
Calcium: 8.7 mg/dL — ABNORMAL LOW (ref 8.9–10.3)
Chloride: 105 mmol/L (ref 98–111)
Creatinine, Ser: 0.6 mg/dL — ABNORMAL LOW (ref 0.61–1.24)
GFR, Estimated: >60 mL/min (ref >60)
Glucose, Bld: 221 mg/dL — ABNORMAL HIGH (ref 70–99)
Potassium: 3.6 mmol/L (ref 3.5–5.1)
Sodium: 137 mmol/L (ref 135–145)

## 2020-11-09 LAB — RESP PANEL BY RT-PCR (FLU A&B, COVID) ARPGX2
Influenza A by PCR: NEGATIVE
Influenza B by PCR: NEGATIVE
SARS Coronavirus 2 by RT PCR: NEGATIVE

## 2020-11-09 MED ORDER — LABETALOL HCL 5 MG/ML IV SOLN
20.0000 mg | Freq: Once | INTRAVENOUS | Status: DC
  Filled 2020-11-09: qty 4

## 2020-11-09 MED ORDER — DOXYCYCLINE HYCLATE 100 MG PO CAPS: 100.0000 mg | ORAL_CAPSULE | Freq: Two times a day (BID) | ORAL | 0 refills | Status: DC

## 2020-11-09 NOTE — ED Notes (Signed)
MD aware of pts BP at this time. BP better at this time.

## 2020-11-09 NOTE — ED Provider Notes (Signed)
[facility] Provider Note  CSN: [id number] Arrival date & time: 11/09/20 1305    History Chief Complaint  Patient presents with  . Hemoptysis    HPI  Jerry Shaffer is a 59 y.o. male with history of CAD, DM with recent stent placement for NSTEMI (09/20/20) taking ASA and Plavix, reports he has been having nasal congestion, cough since he was at work last night with some hemoptysis this morning he describes as streaks of blood mixed with clear sputum. Denies fever, CP, SOB. No leg swelling. No N/V/D. He no longer smokes, but does still vape.    Past Medical History:  Diagnosis Date  . Bipolar 1 disorder ([place])   . Coronary artery disease   . Diabetes mellitus   . Dyslipidemia   . Emphysema   . Mental disorder    BIPOLAR DISORDER  . Migraine   . NSTEMI (non-ST elevated myocardial infarction) ([place]) 09/20/2020  . PONV (postoperative nausea and vomiting)   . Renal disorder   . Shortness of breath   . Stroke ([place])   . Transient ischemic attack (TIA)     Past Surgical History:  Procedure Laterality Date  . CORONARY ANGIOGRAPHY  09/20/2020  . CORONARY STENT INTERVENTION  09/20/2020   CORONARY STENT INTERVENTION  . CORONARY STENT INTERVENTION N/A 09/20/2020   Procedure: CORONARY STENT INTERVENTION;  Surgeon: Belva Crome, MD;  Location: [facility];  Service: Cardiovascular;  Laterality: N/A;  . HEMORROIDECTOMY    . KNEE ARTHROSCOPY    . LEFT HEART CATH AND CORONARY ANGIOGRAPHY N/A 09/20/2020   Procedure: LEFT HEART CATH AND CORONARY ANGIOGRAPHY;  Surgeon: Belva Crome, MD;  Location: [facility];  Service: Cardiovascular;  Laterality: N/A;  . LUMBAR [place]    . TONSILLECTOMY AND ADENOIDECTOMY      Family History  Problem Relation Age of Onset  . Diabetes type II Other   . Coronary artery disease Other   . Bipolar disorder Other   . Lung cancer Father   . CAD Mother 108    Social History   Tobacco Use  . Smoking status:  Former Smoker    Years: 30.00    Types: Cigarettes  . Smokeless tobacco: Current User  Vaping Use  . Vaping Use: Never used  Substance Use Topics  . Alcohol use: No  . Drug use: No     Home Medications Prior to Admission medications   Medication Sig Start Date End Date Taking? Authorizing Provider  acetaminophen (TYLENOL) 500 MG tablet Take 500 mg by mouth every 6 (six) hours as needed.    [provider]  aspirin 81 MG chewable tablet Chew 1 tablet (81 mg total) by mouth daily. 09/22/20   Barrett, Evelene Croon, PA-C  atorvastatin (LIPITOR) 80 MG tablet Take 1 tablet (80 mg total) by mouth daily. 09/22/20   Barrett, Evelene Croon, PA-C  carvedilol (COREG) 3.125 MG tablet Take 1 tablet (3.125 mg total) by mouth 2 (two) times daily with a meal. 09/21/20   Barrett, Evelene Croon, PA-C  clopidogrel (PLAVIX) 75 MG tablet Take 1 tablet (75 mg total) by mouth daily with breakfast. 09/22/20   Barrett, Evelene Croon, PA-C  doxycycline (VIBRAMYCIN) 100 MG capsule Take 1 capsule (100 mg total) by mouth 2 (two) times daily. 11/09/20   Truddie Hidden, MD  EPINEPHrine 0.3 mg/0.3 mL IJ SOAJ injection Inject 0.3 mg into the muscle as needed for anaphylaxis. 11/02/20   Renato Shin, MD  Fluticasone-Salmeterol (ADVAIR DISKUS) 250-50 MCG/DOSE AEPB Take 1 puff twice daily.  Use regularly, not a rescue inhaler. Patient taking differently: Inhale 1 puff into the lungs as needed (shortness of breath). Take 1 puff twice daily.  Use regularly, not a rescue inhaler. 02/21/19   Molpus, John, MD  Ibuprofen (ADVIL) 200 MG CAPS Take by mouth.    [provider]  insulin degludec (TRESIBA FLEXTOUCH) 100 UNIT/ML FlexTouch Pen Inject 20 Units into the skin daily. 11/02/20   Renato Shin, MD  Insulin Pen Needle 32G X 4 MM MISC Inject 20 units insulin daily 11/02/20   Renato Shin, MD  isosorbide mononitrate (IMDUR) 30 MG 24 hr tablet Take 1 tablet (30 mg total) by mouth daily. 10/10/20 01/08/21  Erlene Quan, PA-C    metFORMIN (GLUCOPHAGE-XR) 500 MG 24 hr tablet Take 4 tablets (2,000 mg total) by mouth daily with breakfast. 11/02/20   Renato Shin, MD  nitroGLYCERIN (NITROSTAT) 0.4 MG SL tablet Place 1 tablet (0.4 mg total) under the tongue every 5 (five) minutes x 3 doses as needed for chest pain. 09/21/20   Barrett, Evelene Croon, PA-C     Allergies    Bee venom, Penicillins, and Procaine   Review of Systems   Review of Systems A comprehensive review of systems was completed and negative except as noted in HPI.    Physical Exam BP (!) 150/90   Pulse 76   Temp 98.5 F (36.9 C) (Oral)   Resp 16   Ht 5\' 8"  (1.727 m)   SpO2 95%   BMI 35.73 kg/m   Physical Exam Vitals and nursing note reviewed.  Constitutional:      Appearance: Normal appearance.  HENT:     Head: Normocephalic and atraumatic.     Nose: Nose normal.     Mouth/Throat:     Mouth: Mucous membranes are moist.  Eyes:     Extraocular Movements: Extraocular movements intact.     Conjunctiva/sclera: Conjunctivae normal.  Cardiovascular:     Rate and Rhythm: Normal rate.  Pulmonary:     Effort: Pulmonary effort is normal.     Breath sounds: Rhonchi present.  Abdominal:     General: Abdomen is flat.     Palpations: Abdomen is soft.     Tenderness: There is no abdominal tenderness.  Musculoskeletal:        General: No swelling. Normal range of motion.     Cervical back: Neck supple.  Skin:    General: Skin is warm and dry.  Neurological:     General: No focal deficit present.     Mental Status: He is alert.  Psychiatric:        Mood and Affect: Mood normal.      ED Results / Procedures / Treatments   Labs (all labs ordered are listed, but only abnormal results are displayed) Labs Reviewed  BASIC METABOLIC PANEL - Abnormal; Notable for the following components:      Result Value   Glucose, Bld 221 (*)    Creatinine, Ser 0.60 (*)    Calcium 8.7 (*)    All other components within normal limits  RESP PANEL BY  RT-PCR (FLU A&B, COVID) ARPGX2  CBC WITH DIFFERENTIAL/PLATELET    EKG None  Radiology DG Chest Port 1 View  Result Date: 11/09/2020 CLINICAL DATA:  59 year old male former smoker. EXAM: PORTABLE CHEST 1 VIEW COMPARISON:  Chest radiograph dated 09/21/2019. FINDINGS: Faint streaky density in the left upper lobe has progressed compared to  the prior radiograph. Although this may be chronic, developing infiltrate or bronchopneumonia is not excluded. Clinical correlation and follow-up recommended. No lobar consolidation, pleural effusion or pneumothorax. Top-normal cardiac size. No acute osseous pathology. IMPRESSION: Left upper lobe streaky density, chronic versus developing infiltrate. Clinical correlation is recommended. Electronically Signed   By: Anner Crete M.D.   On: 11/09/2020 16:46    Procedures Procedures  Medications Ordered in the ED Medications - No data to display   MDM Rules/Calculators/A&P MDM Patient with hemoptysis and recent cough. Doubt PE but will check CXR and labs for signs of infection. Also could be viral bronchitis.  ED Course  I have reviewed the triage vital signs and the nursing notes.  Pertinent labs & imaging results that were available during my care of the patient were reviewed by me and considered in my medical decision making (see chart for details).  Clinical Course as of Nov 09 1726  Fri Nov 09, 2020  1535 CBC is normal.    [CS]  1541 BMP with mild hyperglycemia, otherwise unremarkable.    [CS]  8110 CXR with possible early infiltrate. Will plan to treat with Abx for bronchitis vs early CAP presuming Covid is neg.    [CS]  3159 Covid/Flu negative but patient's BP is now significantly more elevated than on arrival. He denies any missed doses of his meds. He admits seeing the high BP readings has caused him anxiety. No signs of end organ damage on labs. Will give a dose of labetalol and reassess.    [CS]  4585 RN has repositioned BP cuff and  subsequent readings are more in line with expectations. Patient comfortable going home. Rx for doxycycline and PCP Follow up.    [CS]    Clinical Course User Index [CS] Truddie Hidden, MD    Final Clinical Impression(s) / ED Diagnoses Final diagnoses:  Hemoptysis  Bronchitis  Community acquired pneumonia of left upper lobe of lung    Rx / DC Orders ED Discharge Orders         Ordered    doxycycline (VIBRAMYCIN) 100 MG capsule  2 times daily        11/09/20 1727           Truddie Hidden, MD 11/09/20 1727

## 2020-11-09 NOTE — ED Triage Notes (Signed)
Patient reports hemoptysis this AM. Patient takes Plavix and aspirin.

## 2020-11-19 ENCOUNTER — Emergency Department (HOSPITAL_COMMUNITY): Payer: BLUE CROSS/BLUE SHIELD

## 2020-11-19 ENCOUNTER — Emergency Department (HOSPITAL_COMMUNITY)
Admission: EM | Admit: 2020-11-19 | Discharge: 2020-11-19 | Disposition: A | Discharge status: Home / Self Care | Payer: BLUE CROSS/BLUE SHIELD | Attending: Emergency Medicine | Admitting: Emergency Medicine

## 2020-11-19 DIAGNOSIS — I10 Essential (primary) hypertension: Secondary | ICD-10-CM | POA: Insufficient documentation

## 2020-11-19 DIAGNOSIS — R202 Paresthesia of skin: Secondary | ICD-10-CM | POA: Diagnosis not present

## 2020-11-19 DIAGNOSIS — I2510 Atherosclerotic heart disease of native coronary artery without angina pectoris: Secondary | ICD-10-CM | POA: Diagnosis not present

## 2020-11-19 DIAGNOSIS — Z8673 Personal history of transient ischemic attack (TIA), and cerebral infarction without residual deficits: Secondary | ICD-10-CM | POA: Insufficient documentation

## 2020-11-19 DIAGNOSIS — Z7982 Long term (current) use of aspirin: Secondary | ICD-10-CM | POA: Insufficient documentation

## 2020-11-19 DIAGNOSIS — Z79899 Other long term (current) drug therapy: Secondary | ICD-10-CM | POA: Diagnosis not present

## 2020-11-19 DIAGNOSIS — E119 Type 2 diabetes mellitus without complications: Secondary | ICD-10-CM | POA: Insufficient documentation

## 2020-11-19 DIAGNOSIS — R0989 Other specified symptoms and signs involving the circulatory and respiratory systems: Secondary | ICD-10-CM | POA: Diagnosis not present

## 2020-11-19 DIAGNOSIS — Z87891 Personal history of nicotine dependence: Secondary | ICD-10-CM | POA: Diagnosis not present

## 2020-11-19 DIAGNOSIS — Z7902 Long term (current) use of antithrombotics/antiplatelets: Secondary | ICD-10-CM | POA: Diagnosis not present

## 2020-11-19 DIAGNOSIS — Z794 Long term (current) use of insulin: Secondary | ICD-10-CM | POA: Diagnosis not present

## 2020-11-19 DIAGNOSIS — J90 Pleural effusion, not elsewhere classified: Secondary | ICD-10-CM | POA: Diagnosis not present

## 2020-11-19 DIAGNOSIS — Z7984 Long term (current) use of oral hypoglycemic drugs: Secondary | ICD-10-CM | POA: Insufficient documentation

## 2020-11-19 DIAGNOSIS — R079 Chest pain, unspecified: Secondary | ICD-10-CM | POA: Insufficient documentation

## 2020-11-19 DIAGNOSIS — R059 Cough, unspecified: Secondary | ICD-10-CM | POA: Insufficient documentation

## 2020-11-19 LAB — CBC
HCT: 43.7 % (ref 39.0–52.0)
Hemoglobin: 14.4 g/dL (ref 13.0–17.0)
MCH: 28.6 pg (ref 26.0–34.0)
MCHC: 33 g/dL (ref 30.0–36.0)
MCV: 86.7 fL (ref 80.0–100.0)
Platelets: 258 10*3/uL (ref 150–400)
RBC: 5.04 MIL/uL (ref 4.22–5.81)
RDW: 13.1 % (ref 11.5–15.5)
WBC: 5.4 10*3/uL (ref 4.0–10.5)
nRBC: 0 % (ref 0.0–0.2)

## 2020-11-19 LAB — BASIC METABOLIC PANEL
Anion gap: 12 (ref 5–15)
BUN: 16 mg/dL (ref 6–20)
CO2: 21 mmol/L — ABNORMAL LOW (ref 22–32)
Calcium: 9 mg/dL (ref 8.9–10.3)
Chloride: 104 mmol/L (ref 98–111)
Creatinine, Ser: 0.68 mg/dL (ref 0.61–1.24)
GFR, Estimated: >60 mL/min (ref >60)
Glucose, Bld: 191 mg/dL — ABNORMAL HIGH (ref 70–99)
Potassium: 3.8 mmol/L (ref 3.5–5.1)
Sodium: 137 mmol/L (ref 135–145)

## 2020-11-19 LAB — TROPONIN I (HIGH SENSITIVITY)
Troponin I (High Sensitivity): 6 ng/L (ref <18)
Troponin I (High Sensitivity): 6 ng/L (ref <18)

## 2020-11-19 LAB — D-DIMER, QUANTITATIVE: D-Dimer, Quant: <0.27 ug/mL-FEU (ref 0.00–0.50)

## 2020-11-19 MED ORDER — ISOSORBIDE MONONITRATE ER 30 MG PO TB24: 60.0000 mg | ORAL_TABLET | Freq: Every day | ORAL | 3 refills | Status: DC

## 2020-11-19 NOTE — ED Triage Notes (Signed)
Pt arrives to ED via POV with chief complaint of chest pain with recent history of stent placement. He states he began to have left sided chest pain over the weekend 2-3 days ago but attributed this to his diagnosed of pneumonia last week. He had a negative covid test at that time and started on doxycyline. He has one more dose left. He began to have numbness in left arm yesterday which is what concerned him further. Currently 4/10 chest pain.

## 2020-11-19 NOTE — ED Provider Notes (Signed)
[facility] EMERGENC[facility] DEPARTMENT Provider Note   CSN: [id number] Arrival date & time: 11/19/20  0844     History Chief Complaint  Patient presents with  . Chest Pain    Jerry Shaffer is a 59 y.o. male   HPI Patient is a 59 year old male with a history of CAD, DM, HLD, stroke, recent NSTEMI 10/7 with PCI to PDA.   Patient states that he has had ongoing very mild chest pain for the past 3 days/starting on Saturday.  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  He states that he had some sharp and also achy chest pain at that time.  He states that he has intermittently been having waxing and waning of the chest pain which is nonpleuritic but is exertional.  He also has associated left arm paresthesias he states that both of these symptoms get significantly worse when he exerts himself and he has found himself walking slower than his normal pace in order to prevent himself from getting chest pain.  Although he states that he is having left arm numbness he states that his chest pain does not seem to be radiating into his shoulder.   He did recently have some cough and congestion which was thought to be due to pneumonia he was treated with doxycycline and he states that his cough and his other symptoms improved.  He also was tested for Covid at that time which was negative.  He denies any shortness of breath, nausea, vomiting, lightheadedness or dizziness.  No abdominal pain fevers or chills no cough cold or congestion.  HPI: A 59 year old patient with a history of treated diabetes, hypertension and obesity presents for evaluation of chest pain. Initial onset of pain was more than 6 hours ago. The patient's chest pain is sharp and is worse with exertion. The patient's chest pain is middle- or left-sided, is not well-localized, is not described as heaviness/pressure/tightness  and does not radiate to the arms/jaw/neck. The patient does not complain of nausea and denies diaphoresis. The patient has smoked in the past 90 days. The patient has no history of stroke, has no history of peripheral artery disease, has no relevant family history of coronary artery disease (first degree relative at less than age 72) and has no history of hypercholesterolemia.   Past Medical History:  Diagnosis Date  . Bipolar 1 disorder (Jerry Shaffer)   . Coronary artery disease   . Diabetes mellitus   . Dyslipidemia   . Emphysema   . Mental disorder    BIPOLAR DISORDER  . Migraine   . NSTEMI (non-ST elevated myocardial infarction) (Jerry Shaffer) 09/20/2020  . PONV (postoperative nausea and vomiting)   . Renal disorder   . Shortness of breath   . Stroke ([name])   . Transient ischemic attack (TIA)     Patient Active Problem List   Diagnosis Date Noted  . CAD S/P percutaneous coronary angioplasty 10/10/2020  . Dyslipidemia, goal LDL below 70 10/10/2020  . Back pain 10/10/2020  . NSTEMI (non-ST elevated myocardial infarction) (Jerry Shaffer) 09/20/2020  . Cellulitis of hand, left 05/03/2013  . Diverticulitis 07/26/2012  . Left facial numbness 07/26/2012  . Headache(784.0) 07/26/2012  . History of TIA (transient ischemic attack) 04/19/2012  . Attention deficit hyperactivity disorder (ADHD) 07/25/2010  . GERD 07/25/2010  . Type II diabetes mellitus (Jerry Shaffer) 12/21/2009  . MIGRAINE HEADACHE 12/21/2009  .  MICROSCOPIC HEMATURIA 12/21/2009  . KNEE PAIN, LEFT 12/21/2009  . HTN (hypertension) 12/21/2009    Past Surgical History:  Procedure Laterality Date  . CORONARY ANGIOGRAPHY  09/20/2020  . CORONARY STENT INTERVENTION  09/20/2020   CORONARY STENT INTERVENTION  . CORONARY STENT INTERVENTION Jerry Shaffer 09/20/2020   Procedure: CORONARY STENT INTERVENTION;  Surgeon: Belva Crome, MD;  Location: [facility];  Service: Cardiovascular;  Laterality: Jerry Shaffer;  . HEMORROIDECTOMY    . KNEE ARTHROSCOPY    . LEFT HEART CATH  AND CORONARY ANGIOGRAPHY Jerry Shaffer 09/20/2020   Procedure: LEFT HEART CATH AND CORONARY ANGIOGRAPHY;  Surgeon: Belva Crome, MD;  Location: [facility];  Service: Cardiovascular;  Laterality: Jerry Shaffer;  . LUMBAR Jerry Shaffer    . TONSILLECTOMY AND ADENOIDECTOMY         Family History  Problem Relation Age of Onset  . Diabetes type II Other   . Coronary artery disease Other   . Bipolar disorder Other   . Lung cancer Father   . CAD Mother 64    Social History   Tobacco Use  . Smoking status: Former Smoker    Years: 30.00    Types: Cigarettes  . Smokeless tobacco: Current User  Vaping Use  . Vaping Use: Never used  Substance Use Topics  . Alcohol use: No  . Drug use: No    Home Medications Prior to Admission medications   Medication Sig Start Date End Date Taking? Authorizing Provider  acetaminophen (TYLENOL) 500 MG tablet Take 1,000 mg by mouth every 6 (six) hours as needed for mild pain or headache.    Yes [provider]  aspirin 81 MG chewable tablet Chew 1 tablet (81 mg total) by mouth daily. 09/22/20  Yes Barrett, Evelene Croon, PA-C  atorvastatin (LIPITOR) 80 MG tablet Take 1 tablet (80 mg total) by mouth daily. 09/22/20  Yes Barrett, Evelene Croon, PA-C  carvedilol (COREG) 3.125 MG tablet Take 1 tablet (3.125 mg total) by mouth 2 (two) times daily with a meal. 09/21/20  Yes Barrett, Evelene Croon, PA-C  clopidogrel (PLAVIX) 75 MG tablet Take 1 tablet (75 mg total) by mouth daily with breakfast. 09/22/20  Yes Barrett, Evelene Croon, PA-C  doxycycline (VIBRAMYCIN) 100 MG capsule Take 1 capsule (100 mg total) by mouth 2 (two) times daily. 11/09/20  Yes Truddie Hidden, MD  EPINEPHrine 0.3 mg/0.3 mL IJ SOAJ injection Inject 0.3 mg into the muscle as needed for anaphylaxis. Patient taking differently: Inject 0.3 mg into the muscle as needed for anaphylaxis (call 911).  11/02/20  Yes Renato Shin, MD  Fluticasone-Salmeterol (ADVAIR DISKUS) 250-50 MCG/DOSE AEPB Take 1 puff twice daily.  Use  regularly, not a rescue inhaler. Patient taking differently: Inhale 1 puff into the lungs daily as needed (shortness of breath).  02/21/19  Yes Molpus, John, MD  Ibuprofen (ADVIL) 200 MG CAPS Take 400 mg by mouth every 8 (eight) hours as needed (for pain).    Yes [provider]  insulin degludec (TRESIBA FLEXTOUCH) 100 UNIT/ML FlexTouch Pen Inject 20 Units into the skin daily. 11/02/20  Yes Renato Shin, MD  metFORMIN (GLUCOPHAGE-XR) 500 MG 24 hr tablet Take 4 tablets (2,000 mg total) by mouth daily with breakfast. Patient taking differently: Take 1,000 mg by mouth 2 (two) times daily.  11/02/20  Yes Renato Shin, MD  nitroGLYCERIN (NITROSTAT) 0.4 MG SL tablet Place 1 tablet (0.4 mg total) under the tongue every 5 (five) minutes x 3 doses as needed for chest  pain. Patient taking differently: Place 0.4 mg under the tongue every 5 (five) minutes as needed for chest pain (max 3 doses).  09/21/20  Yes Barrett, Evelene Croon, PA-C  Insulin Pen Needle 32G X 4 MM MISC Inject 20 units insulin daily 11/02/20   Renato Shin, MD  isosorbide mononitrate (IMDUR) 30 MG 24 hr tablet Take 2 tablets (60 mg total) by mouth daily. 11/19/20 02/17/21  Tedd Sias, PA    Allergies    Bee venom, Penicillins, and Procaine  Review of Systems   Review of Systems  Constitutional: Positive for fatigue. Negative for diaphoresis and fever.  Cardiovascular: Positive for chest pain.  Musculoskeletal:       Arm pain (left)  All other systems reviewed and are negative.   Physical Exam Updated Vital Signs BP 115/84   Pulse 65   Temp 98.6 F (37 C) (Oral)   Resp 16   SpO2 92%   Physical Exam Vitals and nursing note reviewed.  Constitutional:      General: He is not in acute distress.    Appearance: He is obese.     Comments: Pleasant obese 59 year old male in no acute distress Able answer questions appropriately + follow commands  HENT:     Head: Normocephalic and atraumatic.     Nose: Nose normal.      Mouth/Throat:     Mouth: Mucous membranes are moist.  Eyes:     General: No scleral icterus. Cardiovascular:     Rate and Rhythm: Normal rate and regular rhythm.     Pulses: Normal pulses.     Heart sounds: Normal heart sounds.     Comments: 3+ palpable bilateral symmetric DP and radial pulses Heart with no murmurs rubs or gallops.  Heart rate within normal limits Pulmonary:     Effort: Pulmonary effort is normal. No respiratory distress.     Breath sounds: Normal breath sounds. No wheezing.     Comments: Lungs are clear to auscultation all fields.  No tachypnea, no increased work of breathing. Abdominal:     Palpations: Abdomen is soft.     Tenderness: There is no abdominal tenderness. There is no right CVA tenderness, left CVA tenderness, guarding or rebound.  Musculoskeletal:     Cervical back: Normal range of motion.     Right lower leg: No edema.     Left lower leg: No edema.     Comments: No lower extremity swelling, tenderness, bruising, edema.  Skin:    General: Skin is warm and dry.     Capillary Refill: Capillary refill takes less than 2 seconds.  Neurological:     Mental Status: He is alert. Mental status is at baseline.  Psychiatric:        Mood and Affect: Mood normal.        Behavior: Behavior normal.     ED Results / Procedures / Treatments   Labs (all labs ordered are listed, but only abnormal results are displayed) Labs Reviewed  BASIC METABOLIC PANEL - Abnormal; Notable for the following components:      Result Value   CO2 21 (*)    Glucose, Bld 191 (*)    All other components within normal limits  CBC  D-DIMER, QUANTITATIVE (NOT AT [facility])  TROPONIN I (HIGH SENSITIVITY)  TROPONIN I (HIGH SENSITIVITY)    EKG EKG Interpretation  Date/Time:  Monday November 19 2020 08:55:22 EST Ventricular Rate:  82 PR Interval:  156 QRS Duration: 86 QT Interval:  394 QTC Calculation: 460 R Axis:   31 Text Interpretation: Normal sinus rhythm Normal ECG No  significant change since last tracing Confirmed by Dorie Rank [phone]) on 11/19/2020 10:14:02 AM   Radiology DG Chest 2 View  Result Date: 11/19/2020 CLINICAL DATA:  Chest pain EXAM: CHEST - 2 VIEW COMPARISON:  11/09/2020 FINDINGS: Cardiac and mediastinal contours normal.  Vascularity normal. Small pleural effusions bilaterally.  Negative for pneumonia. IMPRESSION: Small bilateral pleural effusions. Negative for heart failure or pneumonia. Electronically Signed   By: Franchot Gallo M.D.   On: 11/19/2020 09:27    Procedures Procedures (including critical care time)  Medications Ordered in ED Medications - No data to display  ED Course  I have reviewed the triage vital signs and the nursing notes.  Pertinent labs & imaging results that were available during my care of the patient were reviewed by me and considered in my medical decision making (see chart for details).  Patient is 59 year old male with past medical history significant for diffuse CAD Recently had cath 10/7.  Had diffuse disease.  He had stents placed to the PDA Has been using Imdur and his short acting nitroglycerin.  He states over the past 2 days he has had to use more frequently.  He has been using Imdur as prescribed but has been using nitro as often as 3 times in a 4-hour period.  Physical exam is unremarkable.  Patient is overall very well-appearing has vital signs within normal limits apart from hypertension which seems to have resolved after my evaluation.  I do have concern for ACS specifically unstable angina Low suspicion for PE however he is describing sharp stabbing chest pain is nonpleuritic will obtain dimer  Clinical Course as of Nov 20 1607  Mon Nov 19, 2020  1055 Troponin has been in process since 8:59 AM it is currently 10:36 AM.  I called and discussed with [facility] which informed me that "it just got found".  I was assured that it would be running at this time it should be completed by 15  minutes from now.   [WF]  1348 Chest x-ray shows small bilateral pleural effusions no evidence of focal infiltrate/pneumonia.  Does not appear to have any pulmonary vascular congestion.   [WF]  1348 CBC without leukocytosis or anemia BMP with mildly elevated blood sugar of 191 no anion gap doubt DKA.  No other significant abnormalities. D-dimer undetectably low Troponin x1 within normal limits.  Second pending.   [WF]  6045 EKG is normal sinus rhythm, no significant axis deviations, no ST-T wave abnormalities, no evidence of ongoing or acute ischemia.   [WF]    Clinical Course User Index [WF] Tedd Sias, [place]   2:38 PM - second troponin resulted now. Is WNLs. No delta at 6 (x2).   3:49 PM Discussed with cardiologist who recommends imdur increase to 60 and close follow up with DR. C.  Pt will call tomorrow to make apt. however per cardiology office will be aware that he needs close follow-up and will be accommodated to this  Discussed case medicine physician who is agreeable to my plan.  As cardiology has weighed in and given recommendations we will discharge this time with close follow-up.  MDM Rules/Calculators/A&P HEAR Score: 3                        Final Clinical Impression(s) / ED Diagnoses Final diagnoses:  Chest pain, unspecified type  Rx / DC Orders ED Discharge Orders         Ordered    isosorbide mononitrate (IMDUR) 30 MG 24 hr tablet  Daily        11/19/20 1559           Tedd Sias, [place] 11/19/20 1611    Dorie Rank, MD 11/20/20 [phone]

## 2020-11-19 NOTE — Discharge Instructions (Addendum)
Please closely follow-up with your primary care and cardiologist.  Your Imdur which is your long-acting nitroglycerin has been increased from 30 to 60 mg.  Please take this as prescribed.  You may return to the emergency department anytime for any new or concerning symptoms.  Please continue take your other medications as prescribed.

## 2020-11-20 ENCOUNTER — Telehealth: Payer: Self-pay | Admitting: Endocrinology

## 2020-11-20 NOTE — Telephone Encounter (Signed)
Patient called as requested by Dr Loanne Drilling to report blood sugars.  He states has been ranging between mid 100's to 200.  Has had a 279 and is currently reading 288 as of 11/20/20 @ 243 PM.  Call back [phone]

## 2020-11-20 NOTE — Telephone Encounter (Signed)
Please increase tresiba to 25 units qd

## 2020-11-20 NOTE — Telephone Encounter (Signed)
Spoken to patient and notified Dr Cordelia Pen comments. Verbalized understanding.

## 2020-11-20 NOTE — Telephone Encounter (Signed)
Will route to MD

## 2020-11-22 ENCOUNTER — Encounter: Payer: Self-pay | Admitting: Cardiovascular Disease

## 2020-11-22 ENCOUNTER — Encounter: Payer: Self-pay | Admitting: *Deleted

## 2020-11-22 ENCOUNTER — Other Ambulatory Visit: Payer: Self-pay

## 2020-11-22 ENCOUNTER — Ambulatory Visit: Payer: BLUE CROSS/BLUE SHIELD | Admitting: Cardiovascular Disease

## 2020-11-22 VITALS — BP 140/82 | HR 70 | Ht 68.0 in | Wt 240.0 lb

## 2020-11-22 DIAGNOSIS — E1159 Type 2 diabetes mellitus with other circulatory complications: Secondary | ICD-10-CM

## 2020-11-22 DIAGNOSIS — I700 Atherosclerosis of aorta: Secondary | ICD-10-CM

## 2020-11-22 DIAGNOSIS — E785 Hyperlipidemia, unspecified: Secondary | ICD-10-CM | POA: Diagnosis not present

## 2020-11-22 DIAGNOSIS — Z87891 Personal history of nicotine dependence: Secondary | ICD-10-CM

## 2020-11-22 DIAGNOSIS — Z006 Encounter for examination for normal comparison and control in clinical research program: Secondary | ICD-10-CM

## 2020-11-22 DIAGNOSIS — I2510 Atherosclerotic heart disease of native coronary artery without angina pectoris: Secondary | ICD-10-CM

## 2020-11-22 DIAGNOSIS — Z9861 Coronary angioplasty status: Secondary | ICD-10-CM

## 2020-11-22 NOTE — Patient Instructions (Addendum)
Medication Instructions: ISOSORBIDE: Take half of the 30 mg tablet for a week and then you can stop completely.   *If you need a refill on your cardiac medications before your next appointment, please call your pharmacy*   Lab Work: Your provider would like for you to return in few weeks to have the following labs drawn: fasting Lipid. You do not need an appointment for the lab. Once in our office lobby there is a podium where you can sign in and ring the doorbell to alert [place] that you are here. The lab is open from 8:00 am to 4:30 pm; closed for lunch from 12:45pm-1:45pm.  If you have labs (blood work) drawn today and your tests are completely normal, you will receive your results only by: Marland Kitchen MyChart Message (if you have MyChart) OR . A paper copy in the mail If you have any lab test that is abnormal or we need to change your treatment, we will call you to review the results.   Testing/Procedures: None ordered   Follow-Up: At [facility], you and your health needs are our priority.  As part of our continuing mission to provide you with exceptional heart care, we have created designated Provider Care Teams.  These Care Teams include your primary Cardiologist (physician) and Advanced Practice Providers (APPs -  Physician Assistants and Nurse Practitioners) who all work together to provide you with the care you need, when you need it.  We recommend signing up for the patient portal called "MyChart".  Sign up information is provided on this After Visit Summary.  MyChart is used to connect with patients for Virtual Visits (Telemedicine).  Patients are able to view lab/test results, encounter notes, upcoming appointments, etc.  Non-urgent messages can be sent to your provider as well.   To learn more about what you can do with MyChart, go to [email or web address].    Your next appointment:   3 month(s)  The format for your next appointment:   In Person  Provider:   You may see Sanda Klein, MD or one of the following Advanced Practice Providers on your designated Care Team:    Almyra Deforest, PA-C  Fabian Sharp, PA-C or   Roby Lofts, [place]

## 2020-11-22 NOTE — Progress Notes (Signed)
Cardiology Office Note:    Date:  11/27/2020   ID:  Jerry Shaffer, DOB Jun 17, 1961, MRN [id number]  PCP:  Patient, No Pcp Per  [facility] Cardiologist:  Sanda Klein, MD  [facility] Electrophysiologist:  None   Referring MD: No ref. provider found   Chief Complaint  Patient presents with   Coronary Artery Disease    History of Present Illness:    Jerry Shaffer is a 59 y.o. male with a hx of recently diagnosed coronary artery disease presenting with inferior NSTEMI on September 20, 2020 due to occlusion of the mid PDA, also known to have aortic atherosclerosis,  migraine headaches, dyslipidemia (low HDL), type 2 diabetes mellitus, adult ADHD, chronic smoker.  Cardiac catheterization performed 09/20/2020 showed the complete occlusion of the large PDA that reached to the apex which was treated with a drug-eluting stent (18 x 2.75 mm Onyx).  Also noted at that time was a bifurcation lesion of the LAD-first diagonal artery (70% LAD, 40% diagonal), and a 40-50% stenosis in the large OM 2 branch of the left circumflex.  LVEF was minimally depressed at cath at 45-50% and filling pressures were normal.  The echo did not show regional wall motion abnormality and EF was 55-60%.  The study was described as showing severe LVH based on measurements, but I think the measurements of the septum was exaggerated.  He probably has moderate LVH.  He had mild diastolic dysfunction with normal filling pressures by echo as well.  He has done well since his infarction and has returned to work.  He has not had any angina pectoris and has not required nitroglycerin.  He does complain of increasing frequency of headaches.  He has had migraines since age 52.  He thinks the current headaches are not migraines but nevertheless are quite bothersome.  We have told him not to use Imitrex and he is concerned about how he is going to manage future migraines.  In addition he has had persistent numbness in his left  forearm and entire hand, "as if I slept on it".  This comes and goes and has persisted for as much as 4 days at a time.  It is not reminiscent of any of the symptoms he had at the time of his infarction.  The pattern suggests cervical radiculopathy in my opinion.  He denies orthopnea or PND, shortness of breath with activity, palpitations, dizziness, syncope, other focal neurological complaints, intermittent claudication.  Past Medical History:  Diagnosis Date   Bipolar 1 disorder ([place])    Coronary artery disease    Diabetes mellitus    Dyslipidemia    Emphysema    Mental disorder    BIPOLAR DISORDER   Migraine    NSTEMI (non-ST elevated myocardial infarction) ([place]) 09/20/2020   PONV (postoperative nausea and vomiting)    Renal disorder    Shortness of breath    Stroke ([place])    Transient ischemic attack (TIA)     Past Surgical History:  Procedure Laterality Date   CORONARY ANGIOGRAPHY  09/20/2020   CORONARY STENT INTERVENTION  09/20/2020   CORONARY STENT INTERVENTION   CORONARY STENT INTERVENTION N/A 09/20/2020   Procedure: CORONARY STENT INTERVENTION;  Surgeon: Belva Crome, MD;  Location: [facility];  Service: Cardiovascular;  Laterality: N/A;   HEMORROIDECTOMY     KNEE ARTHROSCOPY     LEFT HEART CATH AND CORONARY ANGIOGRAPHY N/A 09/20/2020   Procedure: LEFT HEART CATH AND CORONARY ANGIOGRAPHY;  Surgeon:  Belva Crome, MD;  Location: [facility];  Service: Cardiovascular;  Laterality: N/A;   LUMBAR DISC SURGERY     TONSILLECTOMY AND ADENOIDECTOMY      Current Medications: Current Meds  Medication Sig   acetaminophen (TYLENOL) 500 MG tablet Take 1,000 mg by mouth every 6 (six) hours as needed for mild pain or headache.    aspirin 81 MG chewable tablet Chew 1 tablet (81 mg total) by mouth daily.   atorvastatin (LIPITOR) 80 MG tablet Take 1 tablet (80 mg total) by mouth daily.   carvedilol (COREG) 3.125 MG tablet Take 1 tablet (3.125  mg total) by mouth 2 (two) times daily with a meal.   clopidogrel (PLAVIX) 75 MG tablet Take 1 tablet (75 mg total) by mouth daily with breakfast.   EPINEPHrine 0.3 mg/0.3 mL IJ SOAJ injection Inject 0.3 mg into the muscle as needed for anaphylaxis. (Patient taking differently: Inject 0.3 mg into the muscle as needed for anaphylaxis (call 911).)   Fluticasone-Salmeterol (ADVAIR DISKUS) 250-50 MCG/DOSE AEPB Take 1 puff twice daily.  Use regularly, not a rescue inhaler. (Patient taking differently: Inhale 1 puff into the lungs daily as needed (shortness of breath).)   Ibuprofen (ADVIL) 200 MG CAPS Take 400 mg by mouth every 8 (eight) hours as needed (for pain).    insulin degludec (TRESIBA FLEXTOUCH) 100 UNIT/ML FlexTouch Pen Inject 20 Units into the skin daily.   Insulin Pen Needle 32G X 4 MM MISC Inject 20 units insulin daily   metFORMIN (GLUCOPHAGE-XR) 500 MG 24 hr tablet Take 4 tablets (2,000 mg total) by mouth daily with breakfast. (Patient taking differently: Take 1,000 mg by mouth 2 (two) times daily.)   nitroGLYCERIN (NITROSTAT) 0.4 MG SL tablet Place 1 tablet (0.4 mg total) under the tongue every 5 (five) minutes x 3 doses as needed for chest pain. (Patient taking differently: Place 0.4 mg under the tongue every 5 (five) minutes as needed for chest pain (max 3 doses).)   [DISCONTINUED] isosorbide mononitrate (IMDUR) 30 MG 24 hr tablet Take 2 tablets (60 mg total) by mouth daily.     Allergies:   Bee venom, Penicillins, and Procaine   Social History   Socioeconomic History   Marital status: Single    Spouse name: Not on file   Number of children: Not on file   Years of education: Not on file   Highest education level: Not on file  Occupational History   Occupation: Security guard    Employer: [employer]  Tobacco Use   Smoking status: Former Smoker    Years: 30.00    Types: Cigarettes   Smokeless tobacco: Current Counsellor Use: Never used   Substance and Sexual Activity   Alcohol use: No   Drug use: No   Sexual activity: Not Currently  Other Topics Concern   Not on file  Social History Narrative   Not on file   Social Determinants of Health   Financial Resource Strain: Not on file  Food Insecurity: Not on file  Transportation Needs: Not on file  Physical Activity: Not on file  Stress: Not on file  Social Connections: Not on file     Family History: The patient's family history includes Bipolar disorder in an other family member; CAD (age of onset: 32) in his mother; Coronary artery disease in an other family member; Diabetes type II in an other family member; Lung cancer in his father.  ROS:  Please see the history of present illness.     All other systems reviewed and are negative.  EKGs/Labs/Other Studies Reviewed:    The following studies were reviewed today: ECHO 09/20/2020 1. Left ventricular ejection fraction, by estimation, is 55 to 60%. The  left ventricle has normal function. The left ventricle has no regional  wall motion abnormalities. There is severe left ventricular hypertrophy.  Left ventricular diastolic parameters  are consistent with Grade I diastolic dysfunction (impaired relaxation).  2. Right ventricular systolic function is normal. The right ventricular  size is normal. Tricuspid regurgitation signal is inadequate for assessing  PA pressure.  3. The mitral valve is normal in structure. No evidence of mitral valve  regurgitation. No evidence of mitral stenosis.  4. The aortic valve is tricuspid. Aortic valve regurgitation is not  visualized. No aortic stenosis is present.  5. Aortic dilatation noted. There is mild dilatation of the aortic root,  measuring 38 mm.  6. The inferior vena cava is normal in size with greater than 50%  respiratory variability, suggesting right atrial pressure of 3 mmHg.   Cath 09/20/2020  Total occlusion of the mid PDA (a large vessel that  extends to the left ventricular apex), filling sluggishly by apical LAD to PDA collaterals.  This vessel is felt to represent the culprit for the patient's presentation.  Total occlusion of the mid PDA reduced to 0% using an 18 x 2.75 mm Onyx deployed at 12 atm x 2 inflations.  TIMI grade III flow was noted.  Resolution of chest pain occurred.  Proximal to the stented segment there is a 30 to 40% stenosis in the PDA.  Left main is widely patent  LAD contains mid vessel disease with a [place] 011 bifurcation stenosis.  LAD is 70% and diagonal is 40%.  This can be managed medically.  Circumflex contains segmental 40 to 50% stenosis in the large second obtuse marginal.  RCA is dominant, tortuous, and no significant disease other than that noted above in the PDA.  Mid inferior wall to apical akinesis.  LVEDP 6 mmHg.  EF 45 to 50%.  RECOMMENDATIONS:   Aspirin and Plavix for 1 year.  IV nitroglycerin discontinued.  Check hemoglobin A1c.  Aggressive statin therapy for risk reduction.  Smoking cessation  Potential discharge in 24 to 36 hours depending upon clinical course.   EKG:  EKG is ordered today.  The ekg ordered today demonstrates normal sinus rhythm, normal tracing.  There is a diminutive Q wave in lead II.  No ischemic repolarization changes are seen.  Recent Labs: 09/20/2020: ALT 15; B Natriuretic Peptide 136.7; TSH 0.632 11/19/2020: BUN 16; Creatinine, Ser 0.68; Hemoglobin 14.4; Platelets 258; Potassium 3.8; Sodium 137  Recent Lipid Panel    Component Value Date/Time   CHOL 161 09/20/2020 1559   TRIG 124 09/20/2020 1559   HDL 31 (L) 09/20/2020 1559   CHOLHDL 5.2 09/20/2020 1559   VLDL 25 09/20/2020 1559   LDLCALC 105 (H) 09/20/2020 1559     Risk Assessment/Calculations:       Physical Exam:    VS:  BP 140/82    Pulse 70    Ht 5' 8" (1.727 m)    Wt 240 lb (108.9 kg)    SpO2 95%    BMI 36.49 kg/m     Wt Readings from Last 3 Encounters:  11/22/20 240 lb (108.9  kg)  11/02/20 235 lb (106.6 kg)  10/19/20 232 lb (105.2 kg)     GEN:  Severely obese, well nourished, well developed in no acute distress HEENT: Normal NECK: No JVD; No carotid bruits LYMPHATICS: No lymphadenopathy CARDIAC: RRR, no murmurs, rubs, gallops RESPIRATORY:  Clear to auscultation without rales, wheezing or rhonchi  ABDOMEN: Soft, non-tender, non-distended MUSCULOSKELETAL:  No edema; No deformity  SKIN: Warm and dry NEUROLOGIC:  Alert and oriented x 3 PSYCHIATRIC:  Normal affect   ASSESSMENT:    1. Dyslipidemia, goal LDL below 70    PLAN:    In order of problems listed above:  1. CAD: Asymptomatic roughly 2 months status post inferior non-STEMI due to occlusion in the midsegment of a large PDA artery, with known residual moderate disease in the LAD at the first diagonal bifurcation.  Discussed the importance of long-term correction of risk factors.  He does not have angina pectoris and has a lot of headaches.  We will wean him off the isosorbide.  Reinforced importance of uninterrupted dual antitherapy for a minimum of 12 months. 2. HLP: Low HDL cholesterol.   Target LDL less than 70.  Encourage weight loss.  He states that he walks a lot on the job.  Needs to focus on a healthier diet.  He is enrolled in the AEGIS-II clinical trial. 3. Smoking: Congratulated him on quitting smoking cigarettes, but still vaping (not as bad but should wean/quit).  He needs to make a lifetime commitment not to restart smoking 4. DM: He has an appointment with Dr. Loanne Drilling next month.  Most recent hemoglobin A1c was consistent with very poor glycemic control at 11.9%.  Again reinforced importance of a diet low in simple carbohydrates and starches with low glycemic index, higher in lean protein and unsaturated fat.  Avoid sodas and fruit juices, energy drinks.  Encourage weight loss. 5. Aortic atherosclerosis: Noted on imaging studies. 6. Obesity: weight loss would lead to multiple health  benefits.    Shared Decision Making/Informed Consent        Medication Adjustments/Labs and Tests Ordered: Current medicines are reviewed at length with the patient today.  Concerns regarding medicines are outlined above.  Orders Placed This Encounter  Procedures   Lipid panel   EKG 12-Lead   No orders of the defined types were placed in this encounter.   Patient Instructions  Medication Instructions: ISOSORBIDE: Take half of the 30 mg tablet for a week and then you can stop completely.   *If you need a refill on your cardiac medications before your next appointment, please call your pharmacy*   Lab Work: Your provider would like for you to return in few weeks to have the following labs drawn: fasting Lipid. You do not need an appointment for the lab. Once in our office lobby there is a podium where you can sign in and ring the doorbell to alert [place] that you are here. The lab is open from 8:00 am to 4:30 pm; closed for lunch from 12:45pm-1:45pm.  If you have labs (blood work) drawn today and your tests are completely normal, you will receive your results only by:  [place] (if you have MyChart) OR  A paper copy in the mail If you have any lab test that is abnormal or we need to change your treatment, we will call you to review the results.   Testing/Procedures: None ordered   Follow-Up: At [facility], you and your health needs are our priority.  As part of our continuing mission to provide you with exceptional heart care, we have created designated Provider Care Teams.  These Care Teams include your primary Cardiologist (physician) and Advanced Practice Providers (APPs -  Physician Assistants and Nurse Practitioners) who all work together to provide you with the care you need, when you need it.  We recommend signing up for the patient portal called "MyChart".  Sign up information is provided on this After Visit Summary.  MyChart is used to connect with patients  for Virtual Visits (Telemedicine).  Patients are able to view lab/test results, encounter notes, upcoming appointments, etc.  Non-urgent messages can be sent to your provider as well.   To learn more about what you can do with MyChart, go to [email or web address].    Your next appointment:   3 month(s)  The format for your next appointment:   In Person  Provider:   You may see Sanda Klein, MD or one of the following Advanced Practice Providers on your designated Care Team:    Almyra Deforest, PA-C  Fabian Sharp, [place] or   Roby Lofts, PA-C       Signed, Sanda Klein, MD  11/27/2020 12:05 PM    [place]

## 2020-11-27 ENCOUNTER — Encounter: Payer: Self-pay | Admitting: Cardiovascular Disease

## 2020-12-12 NOTE — Research (Signed)
V7  Patient doing ok. Patient was seen at Dr Royann Shivers office today.                                    "CONSENT"   YES     NO   Continuing further Investigational Product and study visits for follow-up? [x]  []   Continuing consent from future biomedical research [x]  []                                     "EVENTS"    YES     NO  AE   (IF YES SEE SOURCE) []  [x]   SAE  (IF YES SEE SOURCE) []  [x]   ENDPOINT   (IF YES SEE SOURCE) []  [x]   REVASCULARIZATION  (IF YES SEE SOURCE) []  [x]   AMPUTATION   (IF YES SEE SOURCE) []  [x]   TROPONIN'S  (IF YES SEE SOURCE) []  [x]      Current Outpatient Medications:  .  acetaminophen (TYLENOL) 500 MG tablet, Take 1,000 mg by mouth every 6 (six) hours as needed for mild pain or headache. , Disp: , Rfl:  .  aspirin 81 MG chewable tablet, Chew 1 tablet (81 mg total) by mouth daily., Disp: , Rfl:  .  atorvastatin (LIPITOR) 80 MG tablet, Take 1 tablet (80 mg total) by mouth daily., Disp: 30 tablet, Rfl: 11 .  carvedilol (COREG) 3.125 MG tablet, Take 1 tablet (3.125 mg total) by mouth 2 (two) times daily with a meal., Disp: 60 tablet, Rfl: 11 .  clopidogrel (PLAVIX) 75 MG tablet, Take 1 tablet (75 mg total) by mouth daily with breakfast., Disp: 30 tablet, Rfl: 11 .  EPINEPHrine 0.3 mg/0.3 mL IJ SOAJ injection, Inject 0.3 mg into the muscle as needed for anaphylaxis. (Patient taking differently: Inject 0.3 mg into the muscle as needed for anaphylaxis (call 911).), Disp: 1 each, Rfl: 0 .  Fluticasone-Salmeterol (ADVAIR DISKUS) 250-50 MCG/DOSE AEPB, Take 1 puff twice daily.  Use regularly, not a rescue inhaler. (Patient taking differently: Inhale 1 puff into the lungs daily as needed (shortness of breath).), Disp: 60 each, Rfl: 0 .  Ibuprofen (ADVIL) 200 MG CAPS, Take 400 mg by mouth every 8 (eight) hours as needed (for pain). , Disp: , Rfl:  .  insulin degludec (TRESIBA FLEXTOUCH) 100 UNIT/ML FlexTouch Pen, Inject 20 Units into the skin daily., Disp: 15 mL, Rfl: 3 .   Insulin Pen Needle 32G X 4 MM MISC, Inject 20 units insulin daily, Disp: 100 each, Rfl: 0 .  metFORMIN (GLUCOPHAGE-XR) 500 MG 24 hr tablet, Take 4 tablets (2,000 mg total) by mouth daily with breakfast. (Patient taking differently: Take 1,000 mg by mouth 2 (two) times daily.), Disp: 360 tablet, Rfl: 3 .  nitroGLYCERIN (NITROSTAT) 0.4 MG SL tablet, Place 1 tablet (0.4 mg total) under the tongue every 5 (five) minutes x 3 doses as needed for chest pain. (Patient taking differently: Place 0.4 mg under the tongue every 5 (five) minutes as needed for chest pain (max 3 doses).), Disp: 25 tablet, Rfl: 12

## 2020-12-13 ENCOUNTER — Telehealth: Payer: Self-pay | Admitting: *Deleted

## 2020-12-13 ENCOUNTER — Encounter: Payer: Self-pay | Admitting: Cardiovascular Disease

## 2020-12-13 ENCOUNTER — Other Ambulatory Visit: Payer: Self-pay

## 2020-12-13 ENCOUNTER — Ambulatory Visit: Payer: BLUE CROSS/BLUE SHIELD | Admitting: Cardiovascular Disease

## 2020-12-13 ENCOUNTER — Encounter: Payer: Self-pay | Admitting: *Deleted

## 2020-12-13 VITALS — BP 142/84 | HR 88 | Ht 68.0 in | Wt 236.6 lb

## 2020-12-13 DIAGNOSIS — E785 Hyperlipidemia, unspecified: Secondary | ICD-10-CM

## 2020-12-13 DIAGNOSIS — I2510 Atherosclerotic heart disease of native coronary artery without angina pectoris: Secondary | ICD-10-CM

## 2020-12-13 DIAGNOSIS — Z9861 Coronary angioplasty status: Secondary | ICD-10-CM

## 2020-12-13 LAB — LIPID PANEL
Chol/HDL Ratio: 2.9 ratio (ref 0.0–5.0)
Cholesterol, Total: 103 mg/dL (ref 100–199)
HDL: 36 mg/dL — ABNORMAL LOW (ref >39)
LDL Chol Calc (NIH): 52 mg/dL (ref 0–99)
Triglycerides: 72 mg/dL (ref 0–149)
VLDL Cholesterol Cal: 15 mg/dL (ref 5–40)

## 2020-12-13 NOTE — Progress Notes (Signed)
Here for lipid profile check. Headaches much better after stopping isosorbide. F/U in 3 months.  BP (!) 142/84   Pulse 88   Ht 5\' 8"  (1.727 m)   Wt 236 lb 9.6 oz (107.3 kg)   SpO2 95%   BMI 35.97 kg/m

## 2020-12-13 NOTE — Patient Instructions (Signed)
Medication Instructions:  No changes *If you need a refill on your cardiac medications before your next appointment, please call your pharmacy*   Lab Work: None ordered If you have labs (blood work) drawn today and your tests are completely normal, you will receive your results only by: [place] MyChart Message (if you have MyChart) OR . A paper copy in the mail If you have any lab test that is abnormal or we need to change your treatment, we will call you to review the results.   Testing/Procedures: None ordered   Follow-Up: At [facility], you and your health needs are our priority.  As part of our continuing mission to provide you with exceptional heart care, we have created designated Provider Care Teams.  These Care Teams include your primary Cardiologist (physician) and Advanced Practice Providers (APPs -  Physician Assistants and Nurse Practitioners) who all work together to provide you with the care you need, when you need it.  We recommend signing up for the patient portal called "MyChart".  Sign up information is provided on this After Visit Summary.  MyChart is used to connect with patients for Virtual Visits (Telemedicine).  Patients are able to view lab/test results, encounter notes, upcoming appointments, etc.  Non-urgent messages can be sent to your provider as well.   To learn more about what you can do with MyChart, go to [email or web address].    Your next appointment:   Keep your follow up appointment as scheduled

## 2020-12-13 NOTE — Telephone Encounter (Signed)
Follow Up:      Pt is returning your call from yesterday. He says he is going to bed, he works 3rd Shift. He said you can leave a message, he can come on Monday.

## 2020-12-19 NOTE — Telephone Encounter (Signed)
Send to Fabio Bering RN

## 2020-12-19 NOTE — Telephone Encounter (Signed)
Jerry Shaffer is calling back due to not hearing back from [name] and stating he currently has what he believes to be a cold. He is wanting to discuss doing his consult over the phone instead of coming in. Please advise.

## 2020-12-26 NOTE — Telephone Encounter (Signed)
I will get in touch with patient.

## 2020-12-28 ENCOUNTER — Encounter: Payer: Self-pay | Admitting: *Deleted

## 2020-12-28 DIAGNOSIS — Z006 Encounter for examination for normal comparison and control in clinical research program: Secondary | ICD-10-CM

## 2020-12-28 NOTE — Research (Signed)
Patient called for [facility] visit.  Patient decided not to come in due to feeling under the weather with stomach bug. He is doing better, still not up to 100% but making progress. Med change included d/c of isosorbide.                                    "CONSENT"   YES     NO   Continuing further Investigational Product and study visits for follow-up? [x]  []   Continuing consent from future biomedical research [x]  []                                    "EVENTS"    YES     NO  AE   (IF YES SEE SOURCE) []  [x]   SAE  (IF YES SEE SOURCE) []  [x]   ENDPOINT   (IF YES SEE SOURCE) []  [x]   REVASCULARIZATION  (IF YES SEE SOURCE) []  [x]   AMPUTATION   (IF YES SEE SOURCE) []  [x]   TROPONIN'S  (IF YES SEE SOURCE) []  [x]    [id number]  MOBILITY:    I HAVE NO PROBLEMS WALKING [x]   I HAVE SLIGHT PROBLEMS WALKING []   I HAVE MODERATE PROBLEMS WALKING []   I HAVE SEVERE PROBLEMS WALKING []   I AM UNABLE TO WALK  []     SELF-CARE:   I HAVE NO PROBLEMS WASING OR DRESSING MYSELF  [x]   I HAVE SLIGHT PROBLEMS WASHING OR DRESSING MYSELF  []   I HAVE MODERATE PROBLEMS WASHING OR DRESSING MYSELF []   I HAVE SEVERE PROBLEMS WASHING OR DRESSING MYSELF  []   I HAVE SEVERE PROBLEMS WASHING OR DRESSING MYSELF  []   I AM UNABLE TO WASH OR DRESS MYSELF []     USUAL ACTIVITIES: (E.G. WORK/STUDY/HOUSEWORK/FAMILY OR LEISURE ACTIVITIES.    I HAVE NO PROBLEMS DOING MY USUAL ACTIVITIES [x]   I HAVE SLIGHT PROBLEMS DOING MY USUAL ACTIVITIES []   I HAVE MODERATE PROBLEMS DOING MY USUAL ACTIVIITIES []   I HAVE SEVERE PROBLEMS DOING MY USUAL ACTIVITIES []   I AM UNABLE TO DO MY USUAL ACTIVITIES []     PAIN /DISCOMFORT   I HAVE NO PAIN OR DISCOMFORT [x]   I HAVE SLIGHT PAIN OR DISCOMFORT []   I HAVE MODERATE PAIN OR DISCOMFORT []   I HAVE SEVERE PAIN OR DISCOMFORT []   I HAVE EXTREME PAIN OR DISCOMFORT []     ANXIETY/DEPRESSION   I AM NOT ANXIOUS OR DEPRESSED [x]   I AM SLIGHTLY ANXIOUS OR DEPRESSED []   I AM MODERATELY ANXIOUS OR  DREPRESSED []   I AM SEVERELY ANXIOUS OR DEPRESSED []   I AM EXTREMELY ANXIOUS OR DEPRESSED []     SCALE OF 0-100 HOW WOULD YOU RATE TODAY?  0 IS THE WORSE AND 100 IS THE BEST HEALTH YOU CAN IMAGINE: 60   Lifestyle Adherence Assessment:   YES NO  Abstinence from smoking/remaining tobacco free X   Cardiac Diet X   Routine physical activity and/or cardiac rehabilitation X     Current Outpatient Medications:  .  acetaminophen (TYLENOL) 500 MG tablet, Take 1,000 mg by mouth every 6 (six) hours as needed for mild pain or headache. , Disp: , Rfl:  .  aspirin 81 MG chewable tablet, Chew 1 tablet (81 mg total) by mouth daily., Disp: , Rfl:  .  atorvastatin (LIPITOR) 80 MG tablet, Take 1 tablet (80 mg total) by mouth  daily., Disp: 30 tablet, Rfl: 11 .  carvedilol (COREG) 3.125 MG tablet, Take 1 tablet (3.125 mg total) by mouth 2 (two) times daily with a meal., Disp: 60 tablet, Rfl: 11 .  clopidogrel (PLAVIX) 75 MG tablet, Take 1 tablet (75 mg total) by mouth daily with breakfast., Disp: 30 tablet, Rfl: 11 .  EPINEPHrine 0.3 mg/0.3 mL IJ SOAJ injection, Inject 0.3 mg into the muscle as needed for anaphylaxis. (Patient taking differently: Inject 0.3 mg into the muscle as needed for anaphylaxis (call 911).), Disp: 1 each, Rfl: 0 .  Fluticasone-Salmeterol (ADVAIR DISKUS) 250-50 MCG/DOSE AEPB, Take 1 puff twice daily.  Use regularly, not a rescue inhaler. (Patient taking differently: Inhale 1 puff into the lungs daily as needed (shortness of breath).), Disp: 60 each, Rfl: 0 .  Ibuprofen (ADVIL) 200 MG CAPS, Take 400 mg by mouth every 8 (eight) hours as needed (for pain). , Disp: , Rfl:  .  insulin degludec (TRESIBA FLEXTOUCH) 100 UNIT/ML FlexTouch Pen, Inject 20 Units into the skin daily., Disp: 15 mL, Rfl: 3 .  Insulin Pen Needle 32G X 4 MM MISC, Inject 20 units insulin daily, Disp: 100 each, Rfl: 0 .  metFORMIN (GLUCOPHAGE-XR) 500 MG 24 hr tablet, Take 4 tablets (2,000 mg total) by mouth daily with  breakfast. (Patient taking differently: Take 1,000 mg by mouth 2 (two) times daily.), Disp: 360 tablet, Rfl: 3 .  nitroGLYCERIN (NITROSTAT) 0.4 MG SL tablet, Place 1 tablet (0.4 mg total) under the tongue every 5 (five) minutes x 3 doses as needed for chest pain. (Patient taking differently: Place 0.4 mg under the tongue every 5 (five) minutes as needed for chest pain (max 3 doses).), Disp: 25 tablet, Rfl: 12

## 2021-01-02 ENCOUNTER — Other Ambulatory Visit: Payer: Self-pay | Admitting: *Deleted

## 2021-01-02 DIAGNOSIS — E1159 Type 2 diabetes mellitus with other circulatory complications: Secondary | ICD-10-CM

## 2021-01-02 MED ORDER — EPINEPHRINE 0.3 MG/0.3ML IJ SOAJ: 0.3000 mg | INTRAMUSCULAR | 0 refills | Status: DC | PRN

## 2021-01-09 ENCOUNTER — Other Ambulatory Visit: Payer: Self-pay

## 2021-01-11 ENCOUNTER — Telehealth: Payer: Self-pay | Admitting: Endocrinology

## 2021-01-11 ENCOUNTER — Other Ambulatory Visit: Payer: Self-pay

## 2021-01-11 ENCOUNTER — Telehealth: Payer: Self-pay

## 2021-01-11 ENCOUNTER — Ambulatory Visit: Payer: BLUE CROSS/BLUE SHIELD | Admitting: Endocrinology

## 2021-01-11 VITALS — BP 142/84 | HR 85 | Ht 68.0 in

## 2021-01-11 DIAGNOSIS — E1159 Type 2 diabetes mellitus with other circulatory complications: Secondary | ICD-10-CM

## 2021-01-11 LAB — POCT GLYCOSYLATED HEMOGLOBIN (HGB A1C): Hemoglobin A1C: 9.4 % — AB (ref 4.0–5.6)

## 2021-01-11 MED ORDER — TRESIBA FLEXTOUCH 100 UNIT/ML ~~LOC~~ SOPN
40.0000 [IU] | PEN_INJECTOR | Freq: Every day | SUBCUTANEOUS | 3 refills | Status: DC
Start: 2021-01-11 — End: 2021-03-07

## 2021-01-11 MED ORDER — FREESTYLE LIBRE 2 READER DEVI: 1.0000 | Freq: Once | 1 refills | Status: AC

## 2021-01-11 MED ORDER — TRULICITY 0.75 MG/0.5ML ~~LOC~~ SOAJ: 0.7500 mg | SUBCUTANEOUS | 3 refills | Status: DC

## 2021-01-11 MED ORDER — FREESTYLE LIBRE 2 SENSOR MISC: 1.0000 | 3 refills | Status: DC

## 2021-01-11 NOTE — Telephone Encounter (Signed)
Spoke with pt to let him know that Dr. Loanne Drilling sent over Rx for glucose monitor.  Jerry Shaffer

## 2021-01-11 NOTE — Telephone Encounter (Signed)
please contact patient: I have sent a prescription to your pharmacy, for the continuous glucose monitor.  Please let us know if you need help with this

## 2021-01-11 NOTE — Progress Notes (Signed)
Subjective:    Patient ID: Jerry Shaffer, male    DOB: [birth year], 60 y.o.   MRN: [id number]  HPI Pt returns for f/u of diabetes mellitus: DM type: 2 Dx'ed: 7989 Complications: CAD and TIA Therapy: insulin since 2021, and metformin.   DKA: never Severe hypoglycemia: never Pancreatitis: never Pancreatic imaging: normal on 2021 CT SDOH: he works [employer], 3rd shift.  Other: pt says this was precip by Seroquel; he took insulin 2016-2017; he declines multiple daily injections.  Interval history: He has increased insulin to 40/d.  no cbg record, but states cbg's vary from 141-230.  There is no trend throughout the day.    Past Medical History:  Diagnosis Date  . Bipolar 1 disorder ([place])   . Coronary artery disease   . Diabetes mellitus   . Dyslipidemia   . Emphysema   . Mental disorder    BIPOLAR DISORDER  . Migraine   . NSTEMI (non-ST elevated myocardial infarction) ([place]) 09/20/2020  . PONV (postoperative nausea and vomiting)   . Renal disorder   . Shortness of breath   . Stroke ([place])   . Transient ischemic attack (TIA)     Past Surgical History:  Procedure Laterality Date  . CORONARY ANGIOGRAPHY  09/20/2020  . CORONARY STENT INTERVENTION  09/20/2020   CORONARY STENT INTERVENTION  . CORONARY STENT INTERVENTION N/A 09/20/2020   Procedure: CORONARY STENT INTERVENTION;  Surgeon: Belva Crome, MD;  Location: [facility];  Service: Cardiovascular;  Laterality: N/A;  . HEMORROIDECTOMY    . KNEE ARTHROSCOPY    . LEFT HEART CATH AND CORONARY ANGIOGRAPHY N/A 09/20/2020   Procedure: LEFT HEART CATH AND CORONARY ANGIOGRAPHY;  Surgeon: Belva Crome, MD;  Location: [facility];  Service: Cardiovascular;  Laterality: N/A;  . LUMBAR [place]    . TONSILLECTOMY AND ADENOIDECTOMY      Social History   Socioeconomic History  . Marital status: Single    Spouse name: Not on file  . Number of children: Not on file  . Years of education: Not on file  . Highest  education level: Not on file  Occupational History  . Occupation: Financial controller: [employer]  Tobacco Use  . Smoking status: Former Smoker    Years: 30.00    Types: Cigarettes  . Smokeless tobacco: Current User  Vaping Use  . Vaping Use: Never used  Substance and Sexual Activity  . Alcohol use: No  . Drug use: No  . Sexual activity: Not Currently  Other Topics Concern  . Not on file  Social History Narrative  . Not on file   Social Determinants of Health   Financial Resource Strain: Not on file  Food Insecurity: Not on file  Transportation Needs: Not on file  Physical Activity: Not on file  Stress: Not on file  Social Connections: Not on file  Intimate Partner Violence: Not on file    Current Outpatient Medications on File Prior to Visit  Medication Sig Dispense Refill  . acetaminophen (TYLENOL) 500 MG tablet Take 1,000 mg by mouth every 6 (six) hours as needed for mild pain or headache.     Marland Kitchen aspirin 81 MG chewable tablet Chew 1 tablet (81 mg total) by mouth daily.    Marland Kitchen atorvastatin (LIPITOR) 80 MG tablet Take 1 tablet (80 mg total) by mouth daily. 30 tablet 11  . carvedilol (COREG) 3.125 MG tablet Take 1 tablet (3.125 mg total) by mouth  2 (two) times daily with a meal. 60 tablet 11  . clopidogrel (PLAVIX) 75 MG tablet Take 1 tablet (75 mg total) by mouth daily with breakfast. 30 tablet 11  . EPINEPHrine 0.3 mg/0.3 mL IJ SOAJ injection Inject 0.3 mg into the muscle as needed for anaphylaxis. 1 each 0  . Fluticasone-Salmeterol (ADVAIR DISKUS) 250-50 MCG/DOSE AEPB Take 1 puff twice daily.  Use regularly, not a rescue inhaler. (Patient taking differently: Inhale 1 puff into the lungs daily as needed (shortness of breath).) 60 each 0  . Ibuprofen (ADVIL) 200 MG CAPS Take 400 mg by mouth every 8 (eight) hours as needed (for pain).     . Insulin Pen Needle 32G X 4 MM MISC Inject 20 units insulin daily 100 each 0  . metFORMIN (GLUCOPHAGE-XR) 500 MG 24 hr tablet  Take 4 tablets (2,000 mg total) by mouth daily with breakfast. (Patient taking differently: Take 1,000 mg by mouth 2 (two) times daily.) 360 tablet 3  . nitroGLYCERIN (NITROSTAT) 0.4 MG SL tablet Place 1 tablet (0.4 mg total) under the tongue every 5 (five) minutes x 3 doses as needed for chest pain. (Patient taking differently: Place 0.4 mg under the tongue every 5 (five) minutes as needed for chest pain (max 3 doses).) 25 tablet 12   No current facility-administered medications on file prior to visit.    Allergies  Allergen Reactions  . Bee Venom Anaphylaxis  . Penicillins Anaphylaxis    Did it involve swelling of the face/tongue/throat, SOB, or low BP? yes Did it involve sudden or severe rash/hives, skin peeling, or any reaction on the inside of your mouth or nose? unknown Did you need to seek medical attention at a hospital or doctor's office? yes When did it last happen?1967 If all above answers are "NO", may proceed with cephalosporin use.   . Procaine Anaphylaxis    Family History  Problem Relation Age of Onset  . Diabetes type II Other   . Coronary artery disease Other   . Bipolar disorder Other   . Lung cancer Father   . CAD Mother 31    BP (!) 142/84 (BP Location: Right Arm, Patient Position: Sitting, Cuff Size: Large)   Pulse 85   Ht 5\' 8"  (1.727 m)   SpO2 97%   BMI 35.97 kg/m   Review of Systems     Objective:   Physical Exam VITAL SIGNS:  See vs page GENERAL: no distress Pulses: dorsalis pedis intact bilat.   MSK: no deformity of the feet CV: 1+ bilat leg edema Skin:  no ulcer on the feet.  normal color and temp on the feet. Neuro: sensation is intact to touch on the feet, but decreased from normal.   Ext: there is bilateral onychomycosis of the toenails.    Lab Results  Component Value Date   HGBA1C 9.4 (A) 01/11/2021       Assessment & Plan:  HTN: is noted today Insulin-requiring type 2 DM: uncontrolled  Patient Instructions  Your blood  pressure is high today.  Please see your heart doctor soon, to have it rechecked.   check your blood sugar twice a day.  vary the time of day when you check, between before the 3 meals, and at bedtime.  also check if you have symptoms of your blood sugar being too high or too low.  please keep a record of the readings and bring it to your next appointment here (or you can bring the meter itself).  You  can write it on any piece of paper.  please call us sooner if your blood sugar goes below 70, or if you have a lot of readings over 200.  I have sent 2 prescriptions to your pharmacy, to add "Trulicity."  Please continue the same other medications.   Please come back for a follow-up appointment in 2 months.

## 2021-01-11 NOTE — Patient Instructions (Addendum)
Your blood pressure is high today.  Please see your heart doctor soon, to have it rechecked.   check your blood sugar twice a day.  vary the time of day when you check, between before the 3 meals, and at bedtime.  also check if you have symptoms of your blood sugar being too high or too low.  please keep a record of the readings and bring it to your next appointment here (or you can bring the meter itself).  You can write it on any piece of paper.  please call us sooner if your blood sugar goes below 70, or if you have a lot of readings over 200.  I have sent 2 prescriptions to your pharmacy, to add "Trulicity."  Please continue the same other medications.   Please come back for a follow-up appointment in 2 months.

## 2021-01-14 ENCOUNTER — Telehealth: Payer: Self-pay

## 2021-01-14 NOTE — Telephone Encounter (Signed)
Per Cover[email or web address]  PA has been approved for [id number];Review Type:Prior Auth;Coverage Start Date:12/15/2020;Coverage End Date:01/14/2024

## 2021-01-22 ENCOUNTER — Encounter (HOSPITAL_COMMUNITY): Payer: Self-pay

## 2021-01-22 ENCOUNTER — Telehealth (HOSPITAL_COMMUNITY): Payer: Self-pay

## 2021-01-22 NOTE — Telephone Encounter (Signed)
Attempted to call patient in regards to Cardiac Rehab - LM on VM Mailed letter 

## 2021-01-24 ENCOUNTER — Encounter: Payer: Self-pay | Admitting: Gastroenterology

## 2021-01-30 ENCOUNTER — Encounter: Payer: Self-pay | Admitting: Gastroenterology

## 2021-02-13 ENCOUNTER — Telehealth (HOSPITAL_COMMUNITY): Payer: Self-pay

## 2021-02-13 NOTE — Telephone Encounter (Signed)
No response from pt.  Closed referral  

## 2021-03-07 ENCOUNTER — Encounter: Payer: Self-pay | Admitting: Cardiovascular Disease

## 2021-03-07 ENCOUNTER — Other Ambulatory Visit: Payer: Self-pay

## 2021-03-07 ENCOUNTER — Ambulatory Visit (INDEPENDENT_AMBULATORY_CARE_PROVIDER_SITE_OTHER): Payer: BC Managed Care – PPO | Admitting: Cardiovascular Disease

## 2021-03-07 VITALS — BP 128/86 | HR 90 | Ht 68.0 in | Wt 242.8 lb

## 2021-03-07 DIAGNOSIS — E1159 Type 2 diabetes mellitus with other circulatory complications: Secondary | ICD-10-CM | POA: Diagnosis not present

## 2021-03-07 DIAGNOSIS — E785 Hyperlipidemia, unspecified: Secondary | ICD-10-CM | POA: Diagnosis not present

## 2021-03-07 DIAGNOSIS — I2510 Atherosclerotic heart disease of native coronary artery without angina pectoris: Secondary | ICD-10-CM | POA: Diagnosis not present

## 2021-03-07 DIAGNOSIS — Z794 Long term (current) use of insulin: Secondary | ICD-10-CM

## 2021-03-07 DIAGNOSIS — I700 Atherosclerosis of aorta: Secondary | ICD-10-CM

## 2021-03-07 MED ORDER — CARVEDILOL 6.25 MG PO TABS
6.2500 mg | ORAL_TABLET | Freq: Two times a day (BID) | ORAL | 11 refills | Status: DC
Start: 2021-03-07 — End: 2022-03-26

## 2021-03-07 MED ORDER — NITROGLYCERIN 0.4 MG SL SUBL: 0.4000 mg | SUBLINGUAL_TABLET | SUBLINGUAL | 2 refills | Status: DC | PRN

## 2021-03-07 NOTE — Patient Instructions (Signed)
Medication Instructions:  INCREASE the Carvedilol to 6.25 mg twice daily *If you need a refill on your cardiac medications before your next appointment, please call your pharmacy*   Lab Work: None ordered If you have labs (blood work) drawn today and your tests are completely normal, you will receive your results only by: Marland Kitchen MyChart Message (if you have MyChart) OR . A paper copy in the mail If you have any lab test that is abnormal or we need to change your treatment, we will call you to review the results.   Testing/Procedures: Your physician has requested that you have a lexiscan myoview. For further information please visit [email or web address]. Please follow instruction sheet, as given. This will take place at [address], [address]  How to prepare for your Myocardial Perfusion Test:  Do not eat or drink 3 hours prior to your test, except you may have water.  Do not consume products containing caffeine (regular or decaffeinated) 12 hours prior to your test. (ex: coffee, chocolate, sodas, tea).  Do bring a list of your current medications with you.  If not listed below, you may take your medications as normal.  Do wear comfortable clothes (no dresses or overalls) and walking shoes, tennis shoes preferred (No heels or open toe shoes are allowed).  Do NOT wear cologne, perfume, aftershave, or lotions (deodorant is allowed).  The test will take approximately 3 to 4 hours to complete  If these instructions are not followed, your test will have to be rescheduled.   Follow-Up: At [facility], you and your health needs are our priority.  As part of our continuing mission to provide you with exceptional heart care, we have created designated Provider Care Teams.  These Care Teams include your primary Cardiologist (physician) and Advanced Practice Providers (APPs -  Physician Assistants and Nurse Practitioners) who all work together to provide you with the care you need, when you  need it.  We recommend signing up for the patient portal called "MyChart".  Sign up information is provided on this After Visit Summary.  MyChart is used to connect with patients for Virtual Visits (Telemedicine).  Patients are able to view lab/test results, encounter notes, upcoming appointments, etc.  Non-urgent messages can be sent to your provider as well.   To learn more about what you can do with MyChart, go to [email or web address].    Your next appointment:   Follow up first available with Dr. Sallyanne Kuster or APP

## 2021-03-07 NOTE — Progress Notes (Signed)
Cardiology Office Note:    Date:  03/07/2021   ID:  Jerry Shaffer, DOB [birth year], MRN [id number]  PCP:  Patient, No Pcp Per  [facility] Cardiologist:  Sanda Klein, MD  Normal Electrophysiologist:  None   Referring MD: No ref. provider found   Chief Complaint  Patient presents with  . Chest Pain    History of Present Illness:    Jerry Shaffer is a 60 y.o. male with a hx of recently diagnosed coronary artery disease presenting with inferior NSTEMI on September 20, 2020 due to occlusion of the mid PDA, also known to have aortic atherosclerosis,  migraine headaches, dyslipidemia (low HDL), type 2 diabetes mellitus, adult ADHD, chronic smoker.  He's generally done well since his percutaneous intervention last October, until last Friday, 6 days before this appointment.  He had to move really fast at work and carries some heavier than usual equipment and developed chest tightness reminiscent of his infarction.  The symptoms did resolve when he stops to rest.  However the very next day while he was fishing, not performing any intense activity, he had recurrence of the same symptoms in a stuttering pattern off and on for about 3 hours.  He is concerned that he disturbed something in his coronary circulation and is at risk for having a heart attack.  He did not seek attention for the symptoms and they have not recurred over the last 5 days.  His electrocardiogram today does not show any ischemic changes.  He is also had some "heartburn" that indeed sounds like reflux since it is associated with acid taste and resolves with antacids.  It is quite different from the chest tightness.  The patient specifically denies dyspnea at rest or with exertion, orthopnea, paroxysmal nocturnal dyspnea, syncope, palpitations, focal neurological deficits, intermittent claudication, lower extremity edema, unexplained weight gain, cough, hemoptysis or wheezing.  His glycemic control was poor with a  hemoglobin A1c of 9.4% in January, but since then he has made progress with his glucose control and his sugar is consistently in the 97-147 range.  He is receiving [place] which he splits into 2 daily injections for total of 15 units daily.  He is also on Metformin and Trulicity weekly.  He is compliant with high-dose atorvastatin and his most recent LDL cholesterol was 52.  He does have a low HDL at 36.  He is not smoking.  Cardiac catheterization performed 09/20/2020 showed the complete occlusion of the large PDA that reached to the apex which was treated with a drug-eluting stent (18 x 2.75 mm Onyx).  Also noted at that time was a bifurcation lesion of the LAD-first diagonal artery (70% LAD, 40% diagonal), and a 40-50% stenosis in the large OM 2 branch of the left circumflex.  LVEF was minimally depressed at cath at 45-50% and filling pressures were normal.  The echo did not show regional wall motion abnormality and EF was 55-60%.  The study was described as showing severe LVH based on measurements, but I think the measurements of the septum was exaggerated.  He probably has moderate LVH.  He had mild diastolic dysfunction with normal filling pressures by echo as well.    Past Medical History:  Diagnosis Date  . Bipolar 1 disorder ([place])   . Coronary artery disease   . Diabetes mellitus   . Dyslipidemia   . Emphysema   . Mental disorder    BIPOLAR DISORDER  . Migraine   . NSTEMI (non-ST elevated  myocardial infarction) ([place]) 09/20/2020  . PONV (postoperative nausea and vomiting)   . Renal disorder   . Shortness of breath   . Stroke ([place])   . Transient ischemic attack (TIA)     Past Surgical History:  Procedure Laterality Date  . CORONARY ANGIOGRAPHY  09/20/2020  . CORONARY STENT INTERVENTION  09/20/2020   CORONARY STENT INTERVENTION  . CORONARY STENT INTERVENTION N/A 09/20/2020   Procedure: CORONARY STENT INTERVENTION;  Surgeon: Belva Crome, MD;  Location: [facility];  Service:  Cardiovascular;  Laterality: N/A;  . HEMORROIDECTOMY    . KNEE ARTHROSCOPY    . LEFT HEART CATH AND CORONARY ANGIOGRAPHY N/A 09/20/2020   Procedure: LEFT HEART CATH AND CORONARY ANGIOGRAPHY;  Surgeon: Belva Crome, MD;  Location: [facility];  Service: Cardiovascular;  Laterality: N/A;  . LUMBAR [place]    . TONSILLECTOMY AND ADENOIDECTOMY      Current Medications: Current Meds  Medication Sig  . acetaminophen (TYLENOL) 500 MG tablet Take 1,000 mg by mouth every 6 (six) hours as needed for mild pain or headache.   Marland Kitchen aspirin 81 MG chewable tablet Chew 1 tablet (81 mg total) by mouth daily.  Marland Kitchen atorvastatin (LIPITOR) 80 MG tablet Take 1 tablet (80 mg total) by mouth daily.  . clopidogrel (PLAVIX) 75 MG tablet Take 1 tablet (75 mg total) by mouth daily with breakfast.  . Continuous Blood Gluc Sensor (FREESTYLE LIBRE 2 SENSOR) MISC 1 Device by Does not apply route every 14 (fourteen) days.  . Dulaglutide (TRULICITY) 1.30 QM/5.7QI SOPN Inject 0.75 mg into the skin once a week.  Marland Kitchen EPINEPHrine 0.3 mg/0.3 mL IJ SOAJ injection Inject 0.3 mg into the muscle as needed for anaphylaxis.  Marland Kitchen Fluticasone-Salmeterol (ADVAIR DISKUS) 250-50 MCG/DOSE AEPB Take 1 puff twice daily.  Use regularly, not a rescue inhaler. (Patient taking differently: Inhale 1 puff into the lungs daily as needed (shortness of breath).)  . Ibuprofen (ADVIL) 200 MG CAPS Take 400 mg by mouth every 8 (eight) hours as needed (for pain).   . Insulin Pen Needle 32G X 4 MM MISC Inject 20 units insulin daily  . metFORMIN (GLUCOPHAGE-XR) 500 MG 24 hr tablet Take 4 tablets (2,000 mg total) by mouth daily with breakfast.  . [DISCONTINUED] carvedilol (COREG) 3.125 MG tablet Take 1 tablet (3.125 mg total) by mouth 2 (two) times daily with a meal.  . [DISCONTINUED] nitroGLYCERIN (NITROSTAT) 0.4 MG SL tablet Place 1 tablet (0.4 mg total) under the tongue every 5 (five) minutes x 3 doses as needed for chest pain.     Allergies:   Bee  venom, Penicillins, and Procaine   Social History   Socioeconomic History  . Marital status: Single    Spouse name: Not on file  . Number of children: Not on file  . Years of education: Not on file  . Highest education level: Not on file  Occupational History  . Occupation: Financial controller: [employer]  Tobacco Use  . Smoking status: Former Smoker    Years: 7.00    Types: E-cigarettes  . Smokeless tobacco: Current User  Vaping Use  . Vaping Use: Never used  Substance and Sexual Activity  . Alcohol use: No  . Drug use: No  . Sexual activity: Not Currently  Other Topics Concern  . Not on file  Social History Narrative  . Not on file   Social Determinants of Health   Financial Resource Strain: Not  on file  Food Insecurity: Not on file  Transportation Needs: Not on file  Physical Activity: Not on file  Stress: Not on file  Social Connections: Not on file     Family History: The patient's family history includes Bipolar disorder in an other family member; CAD (age of onset: 51) in his mother; Coronary artery disease in an other family member; Diabetes type II in an other family member; Lung cancer in his father.  ROS:   Please see the history of present illness.     All other systems reviewed and are negative.  EKGs/Labs/Other Studies Reviewed:    The following studies were reviewed today: ECHO 09/20/2020 1. Left ventricular ejection fraction, by estimation, is 55 to 60%. The  left ventricle has normal function. The left ventricle has no regional  wall motion abnormalities. There is severe left ventricular hypertrophy.  Left ventricular diastolic parameters  are consistent with Grade I diastolic dysfunction (impaired relaxation).  2. Right ventricular systolic function is normal. The right ventricular  size is normal. Tricuspid regurgitation signal is inadequate for assessing  PA pressure.  3. The mitral valve is normal in structure. No  evidence of mitral valve  regurgitation. No evidence of mitral stenosis.  4. The aortic valve is tricuspid. Aortic valve regurgitation is not  visualized. No aortic stenosis is present.  5. Aortic dilatation noted. There is mild dilatation of the aortic root,  measuring 38 mm.  6. The inferior vena cava is normal in size with greater than 50%  respiratory variability, suggesting right atrial pressure of 3 mmHg.   Cath 09/20/2020  Total occlusion of the mid PDA (a large vessel that extends to the left ventricular apex), filling sluggishly by apical LAD to PDA collaterals.  This vessel is felt to represent the culprit for the patient's presentation.  Total occlusion of the mid PDA reduced to 0% using an 18 x 2.75 mm Onyx deployed at 12 atm x 2 inflations.  TIMI grade III flow was noted.  Resolution of chest pain occurred.  Proximal to the stented segment there is a 30 to 40% stenosis in the PDA.  Left main is widely patent  LAD contains mid vessel disease with a [place] 011 bifurcation stenosis.  LAD is 70% and diagonal is 40%.  This can be managed medically.  Circumflex contains segmental 40 to 50% stenosis in the large second obtuse marginal.  RCA is dominant, tortuous, and no significant disease other than that noted above in the PDA.  Mid inferior wall to apical akinesis.  LVEDP 6 mmHg.  EF 45 to 50%.  RECOMMENDATIONS:   Aspirin and Plavix for 1 year.  IV nitroglycerin discontinued.  Check hemoglobin A1c.  Aggressive statin therapy for risk reduction.  Smoking cessation  Potential discharge in 24 to 36 hours depending upon clinical course.   EKG: EKG is ordered today shows normal sinus rhythm with a single premature atrial contractions.  As before he has tiny Q waves in lead II but no acute ischemic repolarization abnormalities.  QTc normal 444 ms   Recent Labs: 09/20/2020: ALT 15; B Natriuretic Peptide 136.7; TSH 0.632 11/19/2020: BUN 16; Creatinine, Ser 0.68;  Hemoglobin 14.4; Platelets 258; Potassium 3.8; Sodium 137  Recent Lipid Panel    Component Value Date/Time   CHOL 103 12/13/2020 0824   TRIG 72 12/13/2020 0824   HDL 36 (L) 12/13/2020 0824   CHOLHDL 2.9 12/13/2020 0824   CHOLHDL 5.2 09/20/2020 1559   VLDL 25 09/20/2020 1559  [place] 52 12/13/2020 0824     Risk Assessment/Calculations:       Physical Exam:    VS:  BP 128/86 (BP Location: Left Arm, Patient Position: Sitting)   Pulse 90   Ht _0  (1.727 m)   Wt 242 lb 12.8 oz (110.1 kg)   SpO2 97%   BMI 36.92 kg/m     Wt Readings from Last 3 Encounters:  03/07/21 242 lb 12.8 oz (110.1 kg)  12/13/20 236 lb 9.6 oz (107.3 kg)  11/22/20 240 lb (108.9 kg)    General: Alert, oriented x3, no distress, severely obese Head: no evidence of trauma, PERRL, EOMI, no exophtalmos or lid lag, no myxedema, no xanthelasma; normal ears, nose and oropharynx Neck: normal jugular venous pulsations and no hepatojugular reflux; brisk carotid pulses without delay and no carotid bruits Chest: clear to auscultation, no signs of consolidation by percussion or palpation, normal fremitus, symmetrical and full respiratory excursions Cardiovascular: normal position and quality of the apical impulse, regular rhythm, normal first and second heart sounds, no murmurs, rubs or gallops Abdomen: no tenderness or distention, no masses by palpation, no abnormal pulsatility or arterial bruits, normal bowel sounds, no hepatosplenomegaly Extremities: no clubbing, cyanosis or edema; 2+ radial, ulnar and brachial pulses bilaterally; 2+ right femoral, posterior tibial and dorsalis pedis pulses; 2+ left femoral, posterior tibial and dorsalis pedis pulses; no subclavian or femoral bruits Neurological: grossly nonfocal Psych: Normal mood and affect   ASSESSMENT:    1. Dyslipidemia, goal LDL below 70   2. Other forms of angina pectoris ([place])   3. Type 2 diabetes mellitus with other circulatory complication, with  long-term current use of insulin ([place])   4. Atherosclerosis of aorta ([place])   5. Severe obesity (BMI 35.0-39.9) with comorbidity ([place])    PLAN:    In order of problems listed above:  1. CAD: His chest pain is indeed concerning for unstable angina, but he has been asymptomatic for the last 5 days.  I think at this point for cardiac enzymes are unlikely to be helpful.  He has known residual disease of the LAD-diagonal bifurcation.  His ECG shows low risk findings.  I am not sure that the exertional chest discomfort that he had on Friday and the resting chest discomfort that occurred again and again on Saturday represents the same syndrome.  He is in the time range where he could have developed in-stent restenosis, especially since he has diabetes mellitus.  We will schedule him for a Lexiscan Myoview, as this may help [place] identify the culprit lesion.  I have a very low threshold to repeat coronary angiography if symptoms occur again or if there are ischemic reversible defects on his perfusion study.  I asked him to increase his carvedilol to 6.25 mg twice daily until we clarify the situation, but he felt very tired while on that higher dose of beta-blocker in the past.  He did not tolerate long-acting nitrates due to headaches.  Reinforced the critical importance of dual antiplatelet therapy through October at least. 2. HLP: Excellent reduction in his LDL cholesterol, but he continues to have a low HDL, needs to keep trying to lose weight.  He is enrolled in the AEGIS-II clinical trial. 3. Smoking: Congratulated him on staying quit. 4. DM: Congratulated him on steady improvement in glycemic control (A1c in October 11.9%, January 9 0.4%, expect next will be better).  In my opinion he is a good candidate for [place] or [place].  He has an  upcoming appointment with Dr. Loanne Drilling. 5. Aortic atherosclerosis: Noted incidentally on imaging studies. 6. Obesity: weight loss would lead to multiple health benefits and  improved prognosis.    Shared Decision Making/Informed Consent        Medication Adjustments/Labs and Tests Ordered: Current medicines are reviewed at length with the patient today.  Concerns regarding medicines are outlined above.  Orders Placed This Encounter  Procedures  . MYOCARDIAL PERFUSION IMAGING  . EKG 12-Lead   Meds ordered this encounter  Medications  . carvedilol (COREG) 6.25 MG tablet    Sig: Take 1 tablet (6.25 mg total) by mouth 2 (two) times daily with a meal.    Dispense:  60 tablet    Refill:  11  . nitroGLYCERIN (NITROSTAT) 0.4 MG SL tablet    Sig: Place 1 tablet (0.4 mg total) under the tongue every 5 (five) minutes x 3 doses as needed for chest pain.    Dispense:  25 tablet    Refill:  2    Patient Instructions  Medication Instructions:  INCREASE the Carvedilol to 6.25 mg twice daily *If you need a refill on your cardiac medications before your next appointment, please call your pharmacy*   Lab Work: None ordered If you have labs (blood work) drawn today and your tests are completely normal, you will receive your results only by: Marland Kitchen MyChart Message (if you have MyChart) OR . A paper copy in the mail If you have any lab test that is abnormal or we need to change your treatment, we will call you to review the results.   Testing/Procedures: Your physician has requested that you have a lexiscan myoview. For further information please visit [email or web address]. Please follow instruction sheet, as given. This will take place at [place], [address]  How to prepare for your Myocardial Perfusion Test:  Do not eat or drink 3 hours prior to your test, except you may have water.  Do not consume products containing caffeine (regular or decaffeinated) 12 hours prior to your test. (ex: coffee, chocolate, sodas, tea).  Do bring a list of your current medications with you.  If not listed below, you may take your medications as normal.  Do wear  comfortable clothes (no dresses or overalls) and walking shoes, tennis shoes preferred (No heels or open toe shoes are allowed).  Do NOT wear cologne, perfume, aftershave, or lotions (deodorant is allowed).  The test will take approximately 3 to 4 hours to complete  If these instructions are not followed, your test will have to be rescheduled.   Follow-Up: At [facility], you and your health needs are our priority.  As part of our continuing mission to provide you with exceptional heart care, we have created designated Provider Care Teams.  These Care Teams include your primary Cardiologist (physician) and Advanced Practice Providers (APPs -  Physician Assistants and Nurse Practitioners) who all work together to provide you with the care you need, when you need it.  We recommend signing up for the patient portal called "MyChart".  Sign up information is provided on this After Visit Summary.  MyChart is used to connect with patients for Virtual Visits (Telemedicine).  Patients are able to view lab/test results, encounter notes, upcoming appointments, etc.  Non-urgent messages can be sent to your provider as well.   To learn more about what you can do with MyChart, go to [email or web address].    Your next appointment:   Follow up first available with Dr. Sallyanne Kuster or  APP     Signed, Sanda Klein, MD  03/07/2021 5:53 PM    [place]

## 2021-03-08 ENCOUNTER — Ambulatory Visit (HOSPITAL_COMMUNITY)
Admission: RE | Admit: 2021-03-08 | Discharge: 2021-03-08 | Disposition: A | Discharge status: Home / Self Care | Payer: BC Managed Care – PPO | Source: Ambulatory Visit | Attending: Cardiovascular Disease | Admitting: Cardiovascular Disease

## 2021-03-08 ENCOUNTER — Telehealth: Payer: Self-pay | Admitting: Endocrinology

## 2021-03-08 DIAGNOSIS — I2510 Atherosclerotic heart disease of native coronary artery without angina pectoris: Secondary | ICD-10-CM | POA: Diagnosis not present

## 2021-03-08 LAB — MYOCARDIAL PERFUSION IMAGING
LV dias vol: 149 mL (ref 62–150)
LV sys vol: 70 mL
Peak HR: 111 {beats}/min
Rest HR: 87 {beats}/min
SDS: 2
SRS: 5
SSS: 7
TID: 1.06

## 2021-03-08 MED ORDER — AMINOPHYLLINE 25 MG/ML IV SOLN
75.0000 mg | Freq: Once | INTRAVENOUS | Status: AC
  Administered 2021-03-08: 75 mg via INTRAVENOUS

## 2021-03-08 MED ORDER — TECHNETIUM TC 99M TETROFOSMIN IV KIT
30.9000 | PACK | Freq: Once | INTRAVENOUS | Status: AC | PRN
  Administered 2021-03-08: 30.9 via INTRAVENOUS
  Filled 2021-03-08: qty 31

## 2021-03-08 MED ORDER — TECHNETIUM TC 99M TETROFOSMIN IV KIT
10.8000 | PACK | Freq: Once | INTRAVENOUS | Status: AC | PRN
  Administered 2021-03-08: 10.8 via INTRAVENOUS
  Filled 2021-03-08: qty 11

## 2021-03-08 MED ORDER — REGADENOSON 0.4 MG/5ML IV SOLN
0.4000 mg | Freq: Once | INTRAVENOUS | Status: AC
  Administered 2021-03-08: 0.4 mg via INTRAVENOUS

## 2021-03-08 NOTE — Telephone Encounter (Signed)
MEDICATION: trulicity and tresiba  PHARMACY:    [id number] [place], [place] - [place] Phone:  [phone]  Fax:  [phone]     HAS THE PATIENT CONTACTED [place]?  yes  IS THIS A 90 DAY SUPPLY : yes  IS PATIENT OUT OF MEDICATION: yes  IF NOT; HOW MUCH IS LEFT:   LAST APPOINTMENT DATE: @1 /31/2022  NEXT APPOINTMENT DATE:@5 /05/2021  DO WE HAVE YOUR PERMISSION TO LEAVE A DETAILED MESSAGE?:  OTHER COMMENTS:   **Let patient know to contact pharmacy at the end of the day to make sure medication is ready. **  ** Please notify patient to allow 48-72 hours to process**  **Encourage patient to contact the pharmacy for refills or they can request refills through [facility]**

## 2021-03-10 ENCOUNTER — Other Ambulatory Visit: Payer: Self-pay | Admitting: Cardiovascular Disease

## 2021-03-11 MED ORDER — TRULICITY 0.75 MG/0.5ML ~~LOC~~ SOAJ: 0.7500 mg | SUBCUTANEOUS | 3 refills | Status: DC

## 2021-03-11 MED ORDER — TRESIBA FLEXTOUCH 100 UNIT/ML ~~LOC~~ SOPN: 40.0000 [IU] | PEN_INJECTOR | Freq: Every day | SUBCUTANEOUS | 3 refills | Status: DC

## 2021-03-11 NOTE — Telephone Encounter (Signed)
I refilled both.  TY

## 2021-03-11 NOTE — Addendum Note (Signed)
Addended by: Renato Shin on: 03/11/2021 05:05 PM   Modules accepted: Orders

## 2021-03-11 NOTE — Telephone Encounter (Signed)
Pt called to request both the medications be 90 days or else his insurance wont cover it.Marland KitchenFYI

## 2021-03-11 NOTE — Telephone Encounter (Signed)
Order Providers  Prescribing Provider Encounter Provider  Croitoru, Dani Gobble, MD Croitoru, Dani Gobble, MD    Outpatient Medication Detail   Disp Refills Start End   nitroGLYCERIN (NITROSTAT) 0.4 MG SL tablet 25 tablet 2 03/07/2021    Sig - Route: Place 1 tablet (0.4 mg total) under the tongue every 5 (five) minutes x 3 doses as needed for chest pain. - Sublingual   Sent to pharmacy as: nitroGLYCERIN (NITROSTAT) 0.4 MG SL tablet   E-Prescribing Status: Receipt confirmed by pharmacy (03/07/2021  9:07 AM EDT)    [id number] - [place], [place] - [place]

## 2021-03-11 NOTE — Telephone Encounter (Signed)
I see that pt is currently on Trulicity but so not see Tyler Aas on the active medication list.  Please Advise if this pt is supposed to be taking Tyler Aas so that I can refill accordingly  Thank you

## 2021-03-12 ENCOUNTER — Other Ambulatory Visit: Payer: Self-pay

## 2021-03-12 MED ORDER — NITROGLYCERIN 0.4 MG SL SUBL: 0.4000 mg | SUBLINGUAL_TABLET | SUBLINGUAL | 3 refills | Status: DC | PRN

## 2021-03-13 ENCOUNTER — Other Ambulatory Visit: Payer: Self-pay

## 2021-03-13 MED ORDER — TRULICITY 0.75 MG/0.5ML ~~LOC~~ SOAJ: 0.7500 mg | SUBCUTANEOUS | 3 refills | Status: DC

## 2021-03-13 MED ORDER — TRESIBA FLEXTOUCH 100 UNIT/ML ~~LOC~~ SOPN: 40.0000 [IU] | PEN_INJECTOR | Freq: Every day | SUBCUTANEOUS | 3 refills | Status: DC

## 2021-03-14 ENCOUNTER — Telehealth: Payer: Self-pay | Admitting: Cardiovascular Disease

## 2021-03-14 NOTE — Telephone Encounter (Signed)
Attempted to call patient, left message for patient to call back to office.   

## 2021-03-14 NOTE — Telephone Encounter (Signed)
    Pt is returning call to get stress test result, he said if he unable to answer his phone to leave him a detailed message

## 2021-03-15 ENCOUNTER — Ambulatory Visit: Payer: BC Managed Care – PPO | Admitting: Gastroenterology

## 2021-03-15 NOTE — Telephone Encounter (Signed)
Patient made aware of results and verbalized understanding.  Looks reassuring. Can see an area of permanent damage from the previous infarction, but nothing else that appears to be in peril. Overall heart pumping function is good. If symptoms come back and worsen, we will go straight to a cardiac cath, but for the time being I believe it's OK to monitor. Will discuss again at his next appt. Try to stay on the higher beta blocker dose until then, unless fatigue is intolerable.   Appointment 4/13

## 2021-03-27 ENCOUNTER — Other Ambulatory Visit: Payer: Self-pay

## 2021-03-27 ENCOUNTER — Other Ambulatory Visit (HOSPITAL_COMMUNITY)
Admission: RE | Admit: 2021-03-27 | Discharge: 2021-03-27 | Disposition: A | Discharge status: Home / Self Care | Payer: BC Managed Care – PPO | Source: Ambulatory Visit | Attending: Cardiovascular Disease | Admitting: Cardiovascular Disease

## 2021-03-27 ENCOUNTER — Ambulatory Visit: Payer: BC Managed Care – PPO | Admitting: Cardiovascular Disease

## 2021-03-27 ENCOUNTER — Encounter: Payer: Self-pay | Admitting: Cardiovascular Disease

## 2021-03-27 VITALS — BP 154/90 | HR 83 | Ht 68.0 in | Wt 242.2 lb

## 2021-03-27 DIAGNOSIS — I700 Atherosclerosis of aorta: Secondary | ICD-10-CM | POA: Diagnosis not present

## 2021-03-27 DIAGNOSIS — Z794 Long term (current) use of insulin: Secondary | ICD-10-CM

## 2021-03-27 DIAGNOSIS — Z01812 Encounter for preprocedural laboratory examination: Secondary | ICD-10-CM | POA: Diagnosis not present

## 2021-03-27 DIAGNOSIS — I200 Unstable angina: Secondary | ICD-10-CM | POA: Diagnosis not present

## 2021-03-27 DIAGNOSIS — Z20822 Contact with and (suspected) exposure to covid-19: Secondary | ICD-10-CM | POA: Diagnosis not present

## 2021-03-27 DIAGNOSIS — I2510 Atherosclerotic heart disease of native coronary artery without angina pectoris: Secondary | ICD-10-CM

## 2021-03-27 DIAGNOSIS — E1159 Type 2 diabetes mellitus with other circulatory complications: Secondary | ICD-10-CM

## 2021-03-27 LAB — BASIC METABOLIC PANEL
BUN/Creatinine Ratio: 17 (ref 9–20)
BUN: 12 mg/dL (ref 6–24)
CO2: 22 mmol/L (ref 20–29)
Calcium: 9.3 mg/dL (ref 8.7–10.2)
Chloride: 102 mmol/L (ref 96–106)
Creatinine, Ser: 0.72 mg/dL — ABNORMAL LOW (ref 0.76–1.27)
Glucose: 116 mg/dL — ABNORMAL HIGH (ref 65–99)
Potassium: 4.2 mmol/L (ref 3.5–5.2)
Sodium: 140 mmol/L (ref 134–144)
eGFR: 106 mL/min/{1.73_m2} (ref >59)

## 2021-03-27 LAB — CBC
Hematocrit: 41.2 % (ref 37.5–51.0)
Hemoglobin: 13.5 g/dL (ref 13.0–17.7)
MCH: 28.4 pg (ref 26.6–33.0)
MCHC: 32.8 g/dL (ref 31.5–35.7)
MCV: 87 fL (ref 79–97)
Platelets: 232 10*3/uL (ref 150–450)
RBC: 4.75 x10E6/uL (ref 4.14–5.80)
RDW: 13.3 % (ref 11.6–15.4)
WBC: 8.8 10*3/uL (ref 3.4–10.8)

## 2021-03-27 LAB — SARS CORONAVIRUS 2 (TAT 6-24 HRS): SARS Coronavirus 2: NEGATIVE

## 2021-03-27 NOTE — Patient Instructions (Signed)
Medication Instructions:  No changes  *If you need a refill on your cardiac medications before your next appointment, please call your pharmacy*   Testing/Procedures: Your physician has requested that you have a cardiac catheterization. Cardiac catheterization is used to diagnose and/or treat various heart conditions. Doctors may recommend this procedure for a number of different reasons. The most common reason is to evaluate chest pain. Chest pain can be a symptom of coronary artery disease (CAD), and cardiac catheterization can show whether plaque is narrowing or blocking your heart's arteries. This procedure is also used to evaluate the valves, as well as measure the blood flow and oxygen levels in different parts of your heart. For further information please visit [email or web address]. Please follow instruction sheet, as given.    Follow-Up: At [facility], you and your health needs are our priority.  As part of our continuing mission to provide you with exceptional heart care, we have created designated Provider Care Teams.  These Care Teams include your primary Cardiologist (physician) and Advanced Practice Providers (APPs -  Physician Assistants and Nurse Practitioners) who all work together to provide you with the care you need, when you need it.  We recommend signing up for the patient portal called "MyChart".  Sign up information is provided on this After Visit Summary.  MyChart is used to connect with patients for Virtual Visits (Telemedicine).  Patients are able to view lab/test results, encounter notes, upcoming appointments, etc.  Non-urgent messages can be sent to your provider as well.   To learn more about what you can do with MyChart, go to [email or web address].    Your next appointment:   3 week(s)  The format for your next appointment:   In Person  Provider:   Sanda Klein, MD   Other Instructions    [address] [place] [address] Dept: [phone] Loc: [place]  03/27/2021  You are scheduled for a Cardiac Catheterization on Thursday, April 14 with Dr. Shelva Majestic.  1. Please arrive at the [facility] (Main Entrance A) at [facility]: [address] [place], [place] [address] at 10:00 AM (This time is two hours before your procedure to ensure your preparation). Free valet parking service is available.   Special note: Every effort is made to have your procedure done on time. Please understand that emergencies sometimes delay scheduled procedures.  2. Diet: Do not eat solid foods after midnight.  The patient may have clear liquids until 5am upon the day of the procedure.  3. Labs: You will need to have blood drawn today.  Covid Test Today at 9:50. This is a Drive Up Visit at [address] [place],  [address] Someone will direct you to the appropriate testing line. Stay in your car and someone will be with you shortly.   4. Medication instructions in preparation for your procedure: Hold all diabetes medications the morning of the procedure, including Tresiba and metformin.  Hold metformin both the morning of and for 48 hours after.     On the morning of your procedure, take your Aspirin and Plavix/Clopidogrel and any morning medicines NOT listed above.  You may use sips of water.  5. Plan for one night stay--bring personal belongings. 6. Bring a current list of your medications and current insurance cards. 7. You MUST have a responsible person to drive you home. 8. Someone MUST be with you the  first 24 hours after you arrive home or your discharge will be delayed. 9. Please wear clothes that are easy to get on and off and wear slip-on shoes.  Thank you for allowing [place] to care for you!   -- [facility]

## 2021-03-27 NOTE — H&P (View-Only) (Signed)
Cardiology Office Note:    Date:  03/27/2021   ID:  Jerry Shaffer, DOB 03/23/1961, MRN [id number]  PCP:  Patient, No Pcp Per (Inactive)  [facility] Cardiologist:  Ishmeal Rorie, MD  [facility] Electrophysiologist:  None   Referring MD: No ref. provider found   No chief complaint on file.   History of Present Illness:    Jerry Shaffer is a 59 y.o. male with a hx of recently diagnosed coronary artery disease presenting with inferior NSTEMI on September 20, 2020 due to occlusion of the mid PDA, also known to have aortic atherosclerosis,  migraine headaches, dyslipidemia (low HDL), type 2 diabetes mellitus, adult ADHD, chronic smoker.  He's generally done well since his percutaneous intervention last October, until last Friday, March 18.  He had to move really fast at work and carries some heavier than usual equipment and developed chest tightness reminiscent of his infarction.  The symptoms did resolve when he stops to rest.  However the very next day while he was fishing, not performing any intense activity, he had recurrence of the same symptoms in a stuttering pattern off and on for about 3 hours.  Symptoms were then quiescent for about the next week, but they have happened again the last few days.  He had a emotional outburst last Thursday and had protracted chest pain then.  Symptoms recurred again on Saturday and Sunday and he took as many as 9 nitroglycerin tablets in 1 day.  Each time the symptoms would abate with nitroglycerin but returned after about 10 minutes.  Now he has a very mild dull ache in his left parasternal area.  It is not tender to touch.  He gets short of breath when the chest pain is more severe, but otherwise denies exertional dyspnea, palpitations, dizziness, syncope or other cardiovascular complaints.  He has been compliant with his dual antiplatelet therapy.  He did not seek attention for the symptoms and they have not recurred over the last 5 days.  His  electrocardiogram today does not show any ischemic changes.  He is also had some "heartburn" that indeed sounds like reflux since it is associated with acid taste and resolves with antacids.  It is quite different from the chest tightness.  She underwent a Lexiscan Myoview study 03/08/2021.  This showed a fixed inferior wall defect (actually worse on the rest images compared to stress), interpreted as representing diaphragmatic attenuation.  There is no obvious reversible ischemia.  His glycemic control was poor with a hemoglobin A1c of 9.4% in January, but since then he has made progress with his glucose control and his sugar is consistently in the 97-147 range.  He is receiving Tresiba which he splits into 2 daily injections for total of 15 units daily.  He is also on Metformin and Trulicity weekly.  He is compliant with high-dose atorvastatin and his most recent LDL cholesterol was 52.  He does have a low HDL at 36.  He is not smoking.  Cardiac catheterization performed 09/20/2020 showed the complete occlusion of the large PDA that reached to the apex which was treated with a drug-eluting stent (18 x 2.75 mm Onyx).  Also noted at that time was a bifurcation lesion of the LAD-first diagonal artery (70% LAD, 40% diagonal), and a 40-50% stenosis in the large OM 2 branch of the left circumflex.  LVEF was minimally depressed at cath at 45-50% and filling pressures were normal.  The echo did not show regional wall motion   abnormality and EF was 55-60%.  The study was described as showing severe LVH based on measurements, but I think the measurements of the septum was exaggerated.  He probably has moderate LVH.  He had mild diastolic dysfunction with normal filling pressures by echo as well.    Past Medical History:  Diagnosis Date  . Bipolar 1 disorder (HCC)   . Coronary artery disease   . Diabetes mellitus   . Dyslipidemia   . Emphysema   . Mental disorder    BIPOLAR DISORDER  . Migraine   . NSTEMI  (non-ST elevated myocardial infarction) (HCC) 09/20/2020  . PONV (postoperative nausea and vomiting)   . Renal disorder   . Shortness of breath   . Stroke (HCC)   . Transient ischemic attack (TIA)     Past Surgical History:  Procedure Laterality Date  . CORONARY ANGIOGRAPHY  09/20/2020  . CORONARY STENT INTERVENTION  09/20/2020   CORONARY STENT INTERVENTION  . CORONARY STENT INTERVENTION N/A 09/20/2020   Procedure: CORONARY STENT INTERVENTION;  Surgeon: Smith, Henry W, MD;  Location: [facility];  Service: Cardiovascular;  Laterality: N/A;  . HEMORROIDECTOMY    . KNEE ARTHROSCOPY    . LEFT HEART CATH AND CORONARY ANGIOGRAPHY N/A 09/20/2020   Procedure: LEFT HEART CATH AND CORONARY ANGIOGRAPHY;  Surgeon: Smith, Henry W, MD;  Location: [facility];  Service: Cardiovascular;  Laterality: N/A;  . LUMBAR DISC SURGERY    . TONSILLECTOMY AND ADENOIDECTOMY      Current Medications: Current Meds  Medication Sig  . acetaminophen (TYLENOL) 500 MG tablet Take 1,000 mg by mouth every 6 (six) hours as needed for mild pain or headache.   . aspirin 81 MG chewable tablet Chew 1 tablet (81 mg total) by mouth daily.  . atorvastatin (LIPITOR) 80 MG tablet Take 1 tablet (80 mg total) by mouth daily.  . carvedilol (COREG) 6.25 MG tablet Take 1 tablet (6.25 mg total) by mouth 2 (two) times daily with a meal.  . clopidogrel (PLAVIX) 75 MG tablet Take 1 tablet (75 mg total) by mouth daily with breakfast.  . Continuous Blood Gluc Sensor (FREESTYLE LIBRE 2 SENSOR) MISC 1 Device by Does not apply route every 14 (fourteen) days.  . Dulaglutide (TRULICITY) 0.75 MG/0.5ML SOPN Inject 0.75 mg into the skin once a week.  . EPINEPHrine 0.3 mg/0.3 mL IJ SOAJ injection Inject 0.3 mg into the muscle as needed for anaphylaxis.  . Fluticasone-Salmeterol (ADVAIR DISKUS) 250-50 MCG/DOSE AEPB Take 1 puff twice daily.  Use regularly, not a rescue inhaler. (Patient taking differently: Inhale 1 puff into the lungs  daily as needed (shortness of breath).)  . Ibuprofen (ADVIL) 200 MG CAPS Take 400 mg by mouth every 8 (eight) hours as needed (for pain).   . insulin degludec (TRESIBA FLEXTOUCH) 100 UNIT/ML FlexTouch Pen Inject 40 Units into the skin daily.  . Insulin Pen Needle 32G X 4 MM MISC Inject 20 units insulin daily  . metFORMIN (GLUCOPHAGE-XR) 500 MG 24 hr tablet Take 4 tablets (2,000 mg total) by mouth daily with breakfast.  . nitroGLYCERIN (NITROSTAT) 0.4 MG SL tablet Place 1 tablet (0.4 mg total) under the tongue every 5 (five) minutes x 3 doses as needed for chest pain.     Allergies:   Bee venom, Penicillins, and Procaine   Social History   Socioeconomic History  . Marital status: Single    Spouse name: Not on file  . Number of children: Not on file  .   Years of education: Not on file  . Highest education level: Not on file  Occupational History  . Occupation: Financial controller: [employer]  Tobacco Use  . Smoking status: Former Smoker    Years: 7.00    Types: E-cigarettes  . Smokeless tobacco: Current User  Vaping Use  . Vaping Use: Never used  Substance and Sexual Activity  . Alcohol use: No  . Drug use: No  . Sexual activity: Not Currently  Other Topics Concern  . Not on file  Social History Narrative  . Not on file   Social Determinants of Health   Financial Resource Strain: Not on file  Food Insecurity: Not on file  Transportation Needs: Not on file  Physical Activity: Not on file  Stress: Not on file  Social Connections: Not on file     Family History: The patient's family history includes Bipolar disorder in an other family member; CAD (age of onset: 61) in his mother; Coronary artery disease in an other family member; Diabetes type II in an other family member; Lung cancer in his father.  ROS:   Please see the history of present illness.     All other systems reviewed and are negative.  EKGs/Labs/Other Studies Reviewed:    The following  studies were reviewed today: ECHO 09/20/2020 1. Left ventricular ejection fraction, by estimation, is 55 to 60%. The  left ventricle has normal function. The left ventricle has no regional  wall motion abnormalities. There is severe left ventricular hypertrophy.  Left ventricular diastolic parameters  are consistent with Grade I diastolic dysfunction (impaired relaxation).  2. Right ventricular systolic function is normal. The right ventricular  size is normal. Tricuspid regurgitation signal is inadequate for assessing  PA pressure.  3. The mitral valve is normal in structure. No evidence of mitral valve  regurgitation. No evidence of mitral stenosis.  4. The aortic valve is tricuspid. Aortic valve regurgitation is not  visualized. No aortic stenosis is present.  5. Aortic dilatation noted. There is mild dilatation of the aortic root,  measuring 38 mm.  6. The inferior vena cava is normal in size with greater than 50%  respiratory variability, suggesting right atrial pressure of 3 mmHg.   Cath 09/20/2020  Total occlusion of the mid PDA (a large vessel that extends to the left ventricular apex), filling sluggishly by apical LAD to PDA collaterals.  This vessel is felt to represent the culprit for the patient's presentation.  Total occlusion of the mid PDA reduced to 0% using an 18 x 2.75 mm Onyx deployed at 12 atm x 2 inflations.  TIMI grade III flow was noted.  Resolution of chest pain occurred.  Proximal to the stented segment there is a 30 to 40% stenosis in the PDA.  Left main is widely patent  LAD contains mid vessel disease with a [place] 011 bifurcation stenosis.  LAD is 70% and diagonal is 40%.  This can be managed medically.  Circumflex contains segmental 40 to 50% stenosis in the large second obtuse marginal.  RCA is dominant, tortuous, and no significant disease other than that noted above in the PDA.  Mid inferior wall to apical akinesis.  LVEDP 6 mmHg.  EF 45 to  50%.  RECOMMENDATIONS:   Aspirin and Plavix for 1 year.  IV nitroglycerin discontinued.  Check hemoglobin A1c.  Aggressive statin therapy for risk reduction.  Smoking cessation  Potential discharge in 24 to 36 hours depending upon clinical course.  Lexiscan Myoview 03/08/2021  Nuclear stress EF is calculated at 53% but visually appears to be at least 55-60%.  There was no ST segment deviation noted during stress.  There is a medium defect of moderate severity present in the basal inferoseptal, basal inferior, mid inferior and apical inferior location. The defect is non-reversible and worse on resting images. In the setting of normal LVF, this is most consistent with diaphragmatic attenuation artifact. No ischemia noted.  This is a low risk study.     EKG: EKG is ordered today shows normal sinus rhythm with a single premature atrial contractions.  As before he has tiny Q waves in lead II but no acute ischemic repolarization abnormalities.  QTc normal 444 ms   Recent Labs: 09/20/2020: ALT 15; B Natriuretic Peptide 136.7; TSH 0.632 11/19/2020: BUN 16; Creatinine, Ser 0.68; Hemoglobin 14.4; Platelets 258; Potassium 3.8; Sodium 137  Recent Lipid Panel    Component Value Date/Time   CHOL 103 12/13/2020 0824   TRIG 72 12/13/2020 0824   HDL 36 (L) 12/13/2020 0824   CHOLHDL 2.9 12/13/2020 0824   CHOLHDL 5.2 09/20/2020 1559   VLDL 25 09/20/2020 1559   LDLCALC 52 12/13/2020 0824     Risk Assessment/Calculations:       Physical Exam:    VS:  BP (!) 154/90 (BP Location: Left Arm, Patient Position: Sitting, Cuff Size: Normal)   Pulse 83   Ht 5' 8" (1.727 m)   Wt 242 lb 3.2 oz (109.9 kg)   SpO2 97%   BMI 36.83 kg/m     Wt Readings from Last 3 Encounters:  03/27/21 242 lb 3.2 oz (109.9 kg)  03/08/21 242 lb (109.8 kg)  03/07/21 242 lb 12.8 oz (110.1 kg)     General: Alert, oriented x3, no distress, obese Head: no evidence of trauma, PERRL, EOMI, no exophtalmos or  lid lag, no myxedema, no xanthelasma; normal ears, nose and oropharynx Neck: normal jugular venous pulsations and no hepatojugular reflux; brisk carotid pulses without delay and no carotid bruits Chest: clear to auscultation, no signs of consolidation by percussion or palpation, normal fremitus, symmetrical and full respiratory excursions Cardiovascular: normal position and quality of the apical impulse, regular rhythm, normal first and second heart sounds, no murmurs, rubs or gallops Abdomen: no tenderness or distention, no masses by palpation, no abnormal pulsatility or arterial bruits, normal bowel sounds, no hepatosplenomegaly Extremities: no clubbing, cyanosis or edema; 2+ radial, ulnar and brachial pulses bilaterally; 2+ right femoral, posterior tibial and dorsalis pedis pulses; 2+ left femoral, posterior tibial and dorsalis pedis pulses; no subclavian or femoral bruits Neurological: grossly nonfocal Psych: Normal mood and affect    ASSESSMENT:    1. Coronary arteriosclerosis in native artery   2. Pre-procedure lab exam    PLAN:    In order of problems listed above:  1. CAD: His chest pain is indeed concerning for unstable angina, but his electrocardiogram remains normal.  Timing is consistent with in-stent restenosis in the right coronary artery.  He has known residual disease of the LAD-diagonal bifurcation.  The nuclear perfusion study suggests inferior wall scar without obvious ischemia, but there is evidence of attenuation artifact which interferes with her evaluation.  Symptoms have recurred despite increasing his beta-blocker dosage.  Chest discomfort is responsive to nitroglycerin.  We will schedule him for repeat coronary angiogram tomorrow with Dr. Kelly on 12 noon.  Continue antiplatelet therapy.  Hold medicines for diabetes on the morning of the procedure. This procedure has been   fully reviewed with the patient and written informed consent has been obtained. 2. HLP: Excellent  reduction in his LDL cholesterol, but he continues to have a low HDL, needs to keep trying to lose weight.  He is enrolled in the AEGIS-II clinical trial. 3. Smoking: Congratulated him on staying quit. 4. DM: Congratulated him on steady improvement in glycemic control (A1c in October 11.9%, January 9.4%, expect next will be better).  In my opinion he is a good candidate for Jardiance or Farxiga.  He has an upcoming appointment with Dr. Ellison on May 6 60. 5. Aortic atherosclerosis: Noted incidentally on imaging studies. 6. Obesity: weight loss would lead to multiple health benefits and improved prognosis.  Shared Decision Making/Informed Consent      This procedure has been fully reviewed with the patient and written informed consent has been obtained.    Medication Adjustments/Labs and Tests Ordered: Current medicines are reviewed at length with the patient today.  Concerns regarding medicines are outlined above.  Orders Placed This Encounter  Procedures  . CBC  . Basic metabolic panel  . EKG 12-Lead   No orders of the defined types were placed in this encounter.   There are no Patient Instructions on file for this visit.   Signed, Adysson Revelle, MD  03/27/2021 8:41 AM     [facility]  

## 2021-03-27 NOTE — Progress Notes (Signed)
Cardiology Office Note:    Date:  03/27/2021   ID:  Jerry Shaffer, DOB 02/15/1961, MRN [id number]  PCP:  Patient, No Pcp Per (Inactive)  [facility] Cardiologist:  Mihai Croitoru, MD  [facility] Electrophysiologist:  None   Referring MD: No ref. provider found   No chief complaint on file.   History of Present Illness:    Jerry Shaffer is a 59 y.o. male with a hx of recently diagnosed coronary artery disease presenting with inferior NSTEMI on September 20, 2020 due to occlusion of the mid PDA, also known to have aortic atherosclerosis,  migraine headaches, dyslipidemia (low HDL), type 2 diabetes mellitus, adult ADHD, chronic smoker.  He's generally done well since his percutaneous intervention last October, until last Friday, March 18.  He had to move really fast at work and carries some heavier than usual equipment and developed chest tightness reminiscent of his infarction.  The symptoms did resolve when he stops to rest.  However the very next day while he was fishing, not performing any intense activity, he had recurrence of the same symptoms in a stuttering pattern off and on for about 3 hours.  Symptoms were then quiescent for about the next week, but they have happened again the last few days.  He had a emotional outburst last Thursday and had protracted chest pain then.  Symptoms recurred again on Saturday and Sunday and he took as many as 9 nitroglycerin tablets in 1 day.  Each time the symptoms would abate with nitroglycerin but returned after about 10 minutes.  Now he has a very mild dull ache in his left parasternal area.  It is not tender to touch.  He gets short of breath when the chest pain is more severe, but otherwise denies exertional dyspnea, palpitations, dizziness, syncope or other cardiovascular complaints.  He has been compliant with his dual antiplatelet therapy.  He did not seek attention for the symptoms and they have not recurred over the last 5 days.  His  electrocardiogram today does not show any ischemic changes.  He is also had some "heartburn" that indeed sounds like reflux since it is associated with acid taste and resolves with antacids.  It is quite different from the chest tightness.  She underwent a Lexiscan Myoview study 03/08/2021.  This showed a fixed inferior wall defect (actually worse on the rest images compared to stress), interpreted as representing diaphragmatic attenuation.  There is no obvious reversible ischemia.  His glycemic control was poor with a hemoglobin A1c of 9.4% in January, but since then he has made progress with his glucose control and his sugar is consistently in the 97-147 range.  He is receiving Tresiba which he splits into 2 daily injections for total of 15 units daily.  He is also on Metformin and Trulicity weekly.  He is compliant with high-dose atorvastatin and his most recent LDL cholesterol was 52.  He does have a low HDL at 36.  He is not smoking.  Cardiac catheterization performed 09/20/2020 showed the complete occlusion of the large PDA that reached to the apex which was treated with a drug-eluting stent (18 x 2.75 mm Onyx).  Also noted at that time was a bifurcation lesion of the LAD-first diagonal artery (70% LAD, 40% diagonal), and a 40-50% stenosis in the large OM 2 branch of the left circumflex.  LVEF was minimally depressed at cath at 45-50% and filling pressures were normal.  The echo did not show regional wall motion   abnormality and EF was 55-60%.  The study was described as showing severe LVH based on measurements, but I think the measurements of the septum was exaggerated.  He probably has moderate LVH.  He had mild diastolic dysfunction with normal filling pressures by echo as well.    Past Medical History:  Diagnosis Date  . Bipolar 1 disorder (HCC)   . Coronary artery disease   . Diabetes mellitus   . Dyslipidemia   . Emphysema   . Mental disorder    BIPOLAR DISORDER  . Migraine   . NSTEMI  (non-ST elevated myocardial infarction) (HCC) 09/20/2020  . PONV (postoperative nausea and vomiting)   . Renal disorder   . Shortness of breath   . Stroke (HCC)   . Transient ischemic attack (TIA)     Past Surgical History:  Procedure Laterality Date  . CORONARY ANGIOGRAPHY  09/20/2020  . CORONARY STENT INTERVENTION  09/20/2020   CORONARY STENT INTERVENTION  . CORONARY STENT INTERVENTION N/A 09/20/2020   Procedure: CORONARY STENT INTERVENTION;  Surgeon: Smith, Henry W, MD;  Location: [facility];  Service: Cardiovascular;  Laterality: N/A;  . HEMORROIDECTOMY    . KNEE ARTHROSCOPY    . LEFT HEART CATH AND CORONARY ANGIOGRAPHY N/A 09/20/2020   Procedure: LEFT HEART CATH AND CORONARY ANGIOGRAPHY;  Surgeon: Smith, Henry W, MD;  Location: [facility];  Service: Cardiovascular;  Laterality: N/A;  . LUMBAR DISC SURGERY    . TONSILLECTOMY AND ADENOIDECTOMY      Current Medications: Current Meds  Medication Sig  . acetaminophen (TYLENOL) 500 MG tablet Take 1,000 mg by mouth every 6 (six) hours as needed for mild pain or headache.   . aspirin 81 MG chewable tablet Chew 1 tablet (81 mg total) by mouth daily.  . atorvastatin (LIPITOR) 80 MG tablet Take 1 tablet (80 mg total) by mouth daily.  . carvedilol (COREG) 6.25 MG tablet Take 1 tablet (6.25 mg total) by mouth 2 (two) times daily with a meal.  . clopidogrel (PLAVIX) 75 MG tablet Take 1 tablet (75 mg total) by mouth daily with breakfast.  . Continuous Blood Gluc Sensor (FREESTYLE LIBRE 2 SENSOR) MISC 1 Device by Does not apply route every 14 (fourteen) days.  . Dulaglutide (TRULICITY) 0.75 MG/0.5ML SOPN Inject 0.75 mg into the skin once a week.  . EPINEPHrine 0.3 mg/0.3 mL IJ SOAJ injection Inject 0.3 mg into the muscle as needed for anaphylaxis.  . Fluticasone-Salmeterol (ADVAIR DISKUS) 250-50 MCG/DOSE AEPB Take 1 puff twice daily.  Use regularly, not a rescue inhaler. (Patient taking differently: Inhale 1 puff into the lungs  daily as needed (shortness of breath).)  . Ibuprofen (ADVIL) 200 MG CAPS Take 400 mg by mouth every 8 (eight) hours as needed (for pain).   . insulin degludec (TRESIBA FLEXTOUCH) 100 UNIT/ML FlexTouch Pen Inject 40 Units into the skin daily.  . Insulin Pen Needle 32G X 4 MM MISC Inject 20 units insulin daily  . metFORMIN (GLUCOPHAGE-XR) 500 MG 24 hr tablet Take 4 tablets (2,000 mg total) by mouth daily with breakfast.  . nitroGLYCERIN (NITROSTAT) 0.4 MG SL tablet Place 1 tablet (0.4 mg total) under the tongue every 5 (five) minutes x 3 doses as needed for chest pain.     Allergies:   Bee venom, Penicillins, and Procaine   Social History   Socioeconomic History  . Marital status: Single    Spouse name: Not on file  . Number of children: Not on file  .   Years of education: Not on file  . Highest education level: Not on file  Occupational History  . Occupation: Financial controller: [employer]  Tobacco Use  . Smoking status: Former Smoker    Years: 7.00    Types: E-cigarettes  . Smokeless tobacco: Current User  Vaping Use  . Vaping Use: Never used  Substance and Sexual Activity  . Alcohol use: No  . Drug use: No  . Sexual activity: Not Currently  Other Topics Concern  . Not on file  Social History Narrative  . Not on file   Social Determinants of Health   Financial Resource Strain: Not on file  Food Insecurity: Not on file  Transportation Needs: Not on file  Physical Activity: Not on file  Stress: Not on file  Social Connections: Not on file     Family History: The patient's family history includes Bipolar disorder in an other family member; CAD (age of onset: 11) in his mother; Coronary artery disease in an other family member; Diabetes type II in an other family member; Lung cancer in his father.  ROS:   Please see the history of present illness.     All other systems reviewed and are negative.  EKGs/Labs/Other Studies Reviewed:    The following  studies were reviewed today: ECHO 09/20/2020 1. Left ventricular ejection fraction, by estimation, is 55 to 60%. The  left ventricle has normal function. The left ventricle has no regional  wall motion abnormalities. There is severe left ventricular hypertrophy.  Left ventricular diastolic parameters  are consistent with Grade I diastolic dysfunction (impaired relaxation).  2. Right ventricular systolic function is normal. The right ventricular  size is normal. Tricuspid regurgitation signal is inadequate for assessing  PA pressure.  3. The mitral valve is normal in structure. No evidence of mitral valve  regurgitation. No evidence of mitral stenosis.  4. The aortic valve is tricuspid. Aortic valve regurgitation is not  visualized. No aortic stenosis is present.  5. Aortic dilatation noted. There is mild dilatation of the aortic root,  measuring 38 mm.  6. The inferior vena cava is normal in size with greater than 50%  respiratory variability, suggesting right atrial pressure of 3 mmHg.   Cath 09/20/2020  Total occlusion of the mid PDA (a large vessel that extends to the left ventricular apex), filling sluggishly by apical LAD to PDA collaterals.  This vessel is felt to represent the culprit for the patient's presentation.  Total occlusion of the mid PDA reduced to 0% using an 18 x 2.75 mm Onyx deployed at 12 atm x 2 inflations.  TIMI grade III flow was noted.  Resolution of chest pain occurred.  Proximal to the stented segment there is a 30 to 40% stenosis in the PDA.  Left main is widely patent  LAD contains mid vessel disease with a [place] 011 bifurcation stenosis.  LAD is 70% and diagonal is 40%.  This can be managed medically.  Circumflex contains segmental 40 to 50% stenosis in the large second obtuse marginal.  RCA is dominant, tortuous, and no significant disease other than that noted above in the PDA.  Mid inferior wall to apical akinesis.  LVEDP 6 mmHg.  EF 45 to  50%.  RECOMMENDATIONS:   Aspirin and Plavix for 1 year.  IV nitroglycerin discontinued.  Check hemoglobin A1c.  Aggressive statin therapy for risk reduction.  Smoking cessation  Potential discharge in 24 to 36 hours depending upon clinical course.  Lexiscan Myoview 03/08/2021  Nuclear stress EF is calculated at 53% but visually appears to be at least 55-60%.  There was no ST segment deviation noted during stress.  There is a medium defect of moderate severity present in the basal inferoseptal, basal inferior, mid inferior and apical inferior location. The defect is non-reversible and worse on resting images. In the setting of normal LVF, this is most consistent with diaphragmatic attenuation artifact. No ischemia noted.  This is a low risk study.     EKG: EKG is ordered today shows normal sinus rhythm with a single premature atrial contractions.  As before he has tiny Q waves in lead II but no acute ischemic repolarization abnormalities.  QTc normal 444 ms   Recent Labs: 09/20/2020: ALT 15; B Natriuretic Peptide 136.7; TSH 0.632 11/19/2020: BUN 16; Creatinine, Ser 0.68; Hemoglobin 14.4; Platelets 258; Potassium 3.8; Sodium 137  Recent Lipid Panel    Component Value Date/Time   CHOL 103 12/13/2020 0824   TRIG 72 12/13/2020 0824   HDL 36 (L) 12/13/2020 0824   CHOLHDL 2.9 12/13/2020 0824   CHOLHDL 5.2 09/20/2020 1559   VLDL 25 09/20/2020 1559   LDLCALC 52 12/13/2020 0824     Risk Assessment/Calculations:       Physical Exam:    VS:  BP (!) 154/90 (BP Location: Left Arm, Patient Position: Sitting, Cuff Size: Normal)   Pulse 83   Ht 5' 8" (1.727 m)   Wt 242 lb 3.2 oz (109.9 kg)   SpO2 97%   BMI 36.83 kg/m     Wt Readings from Last 3 Encounters:  03/27/21 242 lb 3.2 oz (109.9 kg)  03/08/21 242 lb (109.8 kg)  03/07/21 242 lb 12.8 oz (110.1 kg)     General: Alert, oriented x3, no distress, obese Head: no evidence of trauma, PERRL, EOMI, no exophtalmos or  lid lag, no myxedema, no xanthelasma; normal ears, nose and oropharynx Neck: normal jugular venous pulsations and no hepatojugular reflux; brisk carotid pulses without delay and no carotid bruits Chest: clear to auscultation, no signs of consolidation by percussion or palpation, normal fremitus, symmetrical and full respiratory excursions Cardiovascular: normal position and quality of the apical impulse, regular rhythm, normal first and second heart sounds, no murmurs, rubs or gallops Abdomen: no tenderness or distention, no masses by palpation, no abnormal pulsatility or arterial bruits, normal bowel sounds, no hepatosplenomegaly Extremities: no clubbing, cyanosis or edema; 2+ radial, ulnar and brachial pulses bilaterally; 2+ right femoral, posterior tibial and dorsalis pedis pulses; 2+ left femoral, posterior tibial and dorsalis pedis pulses; no subclavian or femoral bruits Neurological: grossly nonfocal Psych: Normal mood and affect    ASSESSMENT:    1. Coronary arteriosclerosis in native artery   2. Pre-procedure lab exam    PLAN:    In order of problems listed above:  1. CAD: His chest pain is indeed concerning for unstable angina, but his electrocardiogram remains normal.  Timing is consistent with in-stent restenosis in the right coronary artery.  He has known residual disease of the LAD-diagonal bifurcation.  The nuclear perfusion study suggests inferior wall scar without obvious ischemia, but there is evidence of attenuation artifact which interferes with her evaluation.  Symptoms have recurred despite increasing his beta-blocker dosage.  Chest discomfort is responsive to nitroglycerin.  We will schedule him for repeat coronary angiogram tomorrow with Dr. Kelly on 12 noon.  Continue antiplatelet therapy.  Hold medicines for diabetes on the morning of the procedure. This procedure has been   fully reviewed with the patient and written informed consent has been obtained. 2. HLP: Excellent  reduction in his LDL cholesterol, but he continues to have a low HDL, needs to keep trying to lose weight.  He is enrolled in the AEGIS-II clinical trial. 3. Smoking: Congratulated him on staying quit. 4. DM: Congratulated him on steady improvement in glycemic control (A1c in October 11.9%, January 9.4%, expect next will be better).  In my opinion he is a good candidate for [place] or [place].  He has an upcoming appointment with Dr. Loanne Drilling on May 6 60. 5. Aortic atherosclerosis: Noted incidentally on imaging studies. 6. Obesity: weight loss would lead to multiple health benefits and improved prognosis.  Shared Decision Making/Informed Consent      This procedure has been fully reviewed with the patient and written informed consent has been obtained.    Medication Adjustments/Labs and Tests Ordered: Current medicines are reviewed at length with the patient today.  Concerns regarding medicines are outlined above.  Orders Placed This Encounter  Procedures  . CBC  . Basic metabolic panel  . EKG 12-Lead   No orders of the defined types were placed in this encounter.   There are no Patient Instructions on file for this visit.   Signed, Sanda Klein, MD  03/27/2021 8:41 AM    [facility]

## 2021-03-28 ENCOUNTER — Ambulatory Visit (HOSPITAL_COMMUNITY)
Admission: RE | Admit: 2021-03-28 | Discharge: 2021-03-29 | Disposition: A | Discharge status: Home / Self Care | Payer: BC Managed Care – PPO | Attending: Cardiovascular Disease | Admitting: Cardiovascular Disease

## 2021-03-28 ENCOUNTER — Ambulatory Visit (HOSPITAL_COMMUNITY)
Admission: RE | Disposition: A | Discharge status: Home / Self Care | Payer: Self-pay | Source: Home / Self Care | Attending: Cardiovascular Disease

## 2021-03-28 DIAGNOSIS — Z8249 Family history of ischemic heart disease and other diseases of the circulatory system: Secondary | ICD-10-CM | POA: Diagnosis not present

## 2021-03-28 DIAGNOSIS — Z7982 Long term (current) use of aspirin: Secondary | ICD-10-CM | POA: Diagnosis not present

## 2021-03-28 DIAGNOSIS — Z8673 Personal history of transient ischemic attack (TIA), and cerebral infarction without residual deficits: Secondary | ICD-10-CM | POA: Insufficient documentation

## 2021-03-28 DIAGNOSIS — E119 Type 2 diabetes mellitus without complications: Secondary | ICD-10-CM | POA: Insufficient documentation

## 2021-03-28 DIAGNOSIS — I700 Atherosclerosis of aorta: Secondary | ICD-10-CM | POA: Insufficient documentation

## 2021-03-28 DIAGNOSIS — Z87891 Personal history of nicotine dependence: Secondary | ICD-10-CM | POA: Diagnosis not present

## 2021-03-28 DIAGNOSIS — Z880 Allergy status to penicillin: Secondary | ICD-10-CM | POA: Insufficient documentation

## 2021-03-28 DIAGNOSIS — Z7984 Long term (current) use of oral hypoglycemic drugs: Secondary | ICD-10-CM | POA: Insufficient documentation

## 2021-03-28 DIAGNOSIS — I25119 Atherosclerotic heart disease of native coronary artery with unspecified angina pectoris: Secondary | ICD-10-CM | POA: Diagnosis not present

## 2021-03-28 DIAGNOSIS — Z833 Family history of diabetes mellitus: Secondary | ICD-10-CM | POA: Diagnosis not present

## 2021-03-28 DIAGNOSIS — R12 Heartburn: Secondary | ICD-10-CM | POA: Diagnosis not present

## 2021-03-28 DIAGNOSIS — Z7902 Long term (current) use of antithrombotics/antiplatelets: Secondary | ICD-10-CM | POA: Insufficient documentation

## 2021-03-28 DIAGNOSIS — I252 Old myocardial infarction: Secondary | ICD-10-CM | POA: Diagnosis not present

## 2021-03-28 DIAGNOSIS — E785 Hyperlipidemia, unspecified: Secondary | ICD-10-CM | POA: Diagnosis not present

## 2021-03-28 DIAGNOSIS — Z79899 Other long term (current) drug therapy: Secondary | ICD-10-CM | POA: Insufficient documentation

## 2021-03-28 DIAGNOSIS — Z955 Presence of coronary angioplasty implant and graft: Secondary | ICD-10-CM | POA: Insufficient documentation

## 2021-03-28 HISTORY — PX: CORONARY STENT INTERVENTION: CATH118234

## 2021-03-28 HISTORY — PX: LEFT HEART CATH AND CORONARY ANGIOGRAPHY: CATH118249

## 2021-03-28 LAB — GLUCOSE, CAPILLARY
Glucose-Capillary: 115 mg/dL — ABNORMAL HIGH (ref 70–99)
Glucose-Capillary: 113 mg/dL — ABNORMAL HIGH (ref 70–99)
Glucose-Capillary: 135 mg/dL — ABNORMAL HIGH (ref 70–99)

## 2021-03-28 LAB — POCT ACTIVATED CLOTTING TIME: Activated Clotting Time: 303 seconds

## 2021-03-28 SURGERY — LEFT HEART CATH AND CORONARY ANGIOGRAPHY
Anesthesia: LOCAL

## 2021-03-28 MED ORDER — SODIUM CHLORIDE 0.9% FLUSH: 3.0000 mL | Freq: Two times a day (BID) | INTRAVENOUS | Status: DC

## 2021-03-28 MED ORDER — CLOPIDOGREL BISULFATE 75 MG PO TABS: 75.0000 mg | ORAL_TABLET | ORAL | Status: DC

## 2021-03-28 MED ORDER — ONDANSETRON HCL 4 MG/2ML IJ SOLN: 4.0000 mg | Freq: Four times a day (QID) | INTRAMUSCULAR | Status: DC | PRN

## 2021-03-28 MED ORDER — OXYCODONE HCL 5 MG PO TABS: 5.0000 mg | ORAL_TABLET | ORAL | Status: DC | PRN

## 2021-03-28 MED ORDER — FENTANYL CITRATE (PF) 100 MCG/2ML IJ SOLN
INTRAMUSCULAR | Status: AC
  Filled 2021-03-28: qty 2

## 2021-03-28 MED ORDER — LIDOCAINE HCL (PF) 1 % IJ SOLN
INTRAMUSCULAR | Status: DC | PRN
  Administered 2021-03-28: 2 mL via INTRADERMAL

## 2021-03-28 MED ORDER — IOHEXOL 350 MG/ML SOLN
INTRAVENOUS | Status: DC | PRN
  Administered 2021-03-28: 110 mL via INTRA_ARTERIAL

## 2021-03-28 MED ORDER — VERAPAMIL HCL 2.5 MG/ML IV SOLN
INTRAVENOUS | Status: AC
  Filled 2021-03-28: qty 2

## 2021-03-28 MED ORDER — LIDOCAINE HCL (PF) 1 % IJ SOLN
INTRAMUSCULAR | Status: AC
  Filled 2021-03-28: qty 30

## 2021-03-28 MED ORDER — LABETALOL HCL 5 MG/ML IV SOLN: 10.0000 mg | INTRAVENOUS | Status: AC | PRN

## 2021-03-28 MED ORDER — MORPHINE SULFATE (PF) 2 MG/ML IV SOLN: 2.0000 mg | INTRAVENOUS | Status: DC | PRN

## 2021-03-28 MED ORDER — SODIUM CHLORIDE 0.9% FLUSH
3.0000 mL | INTRAVENOUS | Status: DC | PRN
Start: 2021-03-29 — End: 2021-03-29

## 2021-03-28 MED ORDER — FENTANYL CITRATE (PF) 100 MCG/2ML IJ SOLN
INTRAMUSCULAR | Status: DC | PRN
  Administered 2021-03-28 (×2): 25 ug via INTRAVENOUS

## 2021-03-28 MED ORDER — SODIUM CHLORIDE 0.9% FLUSH: 3.0000 mL | INTRAVENOUS | Status: DC | PRN

## 2021-03-28 MED ORDER — CARVEDILOL 6.25 MG PO TABS
6.2500 mg | ORAL_TABLET | Freq: Two times a day (BID) | ORAL | Status: DC
  Administered 2021-03-28 – 2021-03-29 (×2): 6.25 mg via ORAL
  Filled 2021-03-28 (×3): qty 1

## 2021-03-28 MED ORDER — ACETAMINOPHEN 325 MG PO TABS: 650.0000 mg | ORAL_TABLET | ORAL | Status: DC | PRN

## 2021-03-28 MED ORDER — ACETAMINOPHEN 500 MG PO TABS: 1000.0000 mg | ORAL_TABLET | Freq: Four times a day (QID) | ORAL | Status: DC | PRN

## 2021-03-28 MED ORDER — MIDAZOLAM HCL 2 MG/2ML IJ SOLN
INTRAMUSCULAR | Status: AC
  Filled 2021-03-28: qty 2

## 2021-03-28 MED ORDER — SODIUM CHLORIDE 0.9 % IV SOLN: 250.0000 mL | INTRAVENOUS | Status: DC | PRN

## 2021-03-28 MED ORDER — CLOPIDOGREL BISULFATE 75 MG PO TABS
75.0000 mg | ORAL_TABLET | Freq: Every day | ORAL | Status: DC
  Administered 2021-03-29: 75 mg via ORAL
  Filled 2021-03-28: qty 1

## 2021-03-28 MED ORDER — HEPARIN SODIUM (PORCINE) 1000 UNIT/ML IJ SOLN
INTRAMUSCULAR | Status: DC | PRN
  Administered 2021-03-28 (×2): 5000 [IU] via INTRAVENOUS

## 2021-03-28 MED ORDER — SODIUM CHLORIDE 0.9 % WEIGHT BASED INFUSION: 1.0000 mL/kg/h | INTRAVENOUS | Status: DC

## 2021-03-28 MED ORDER — SODIUM CHLORIDE 0.9 % WEIGHT BASED INFUSION
1.0000 mL/kg/h | INTRAVENOUS | Status: AC
  Administered 2021-03-28: 1 mL/kg/h via INTRAVENOUS

## 2021-03-28 MED ORDER — VERAPAMIL HCL 2.5 MG/ML IV SOLN
INTRAVENOUS | Status: DC | PRN
  Administered 2021-03-28: 10 mL via INTRA_ARTERIAL

## 2021-03-28 MED ORDER — ASPIRIN 81 MG PO CHEW: 81.0000 mg | CHEWABLE_TABLET | ORAL | Status: DC

## 2021-03-28 MED ORDER — NITROGLYCERIN 1 MG/10 ML FOR IR/CATH LAB
INTRA_ARTERIAL | Status: DC | PRN
  Administered 2021-03-28: 150 ug via INTRACORONARY

## 2021-03-28 MED ORDER — SODIUM CHLORIDE 0.9 % WEIGHT BASED INFUSION
3.0000 mL/kg/h | INTRAVENOUS | Status: DC
  Administered 2021-03-28: 3 mL/kg/h via INTRAVENOUS

## 2021-03-28 MED ORDER — MIDAZOLAM HCL 2 MG/2ML IJ SOLN
INTRAMUSCULAR | Status: DC | PRN
  Administered 2021-03-28 (×2): 2 mg via INTRAVENOUS

## 2021-03-28 MED ORDER — ASPIRIN 81 MG PO CHEW
81.0000 mg | CHEWABLE_TABLET | Freq: Every day | ORAL | Status: DC
  Administered 2021-03-29: 81 mg via ORAL
  Filled 2021-03-28: qty 1

## 2021-03-28 MED ORDER — ATORVASTATIN CALCIUM 80 MG PO TABS
80.0000 mg | ORAL_TABLET | Freq: Every day | ORAL | Status: DC
  Administered 2021-03-28 – 2021-03-29 (×2): 80 mg via ORAL
  Filled 2021-03-28 (×3): qty 1

## 2021-03-28 MED ORDER — NITROGLYCERIN 0.4 MG SL SUBL: 0.4000 mg | SUBLINGUAL_TABLET | SUBLINGUAL | Status: DC | PRN

## 2021-03-28 MED ORDER — NITROGLYCERIN 1 MG/10 ML FOR IR/CATH LAB
INTRA_ARTERIAL | Status: AC
  Filled 2021-03-28: qty 10

## 2021-03-28 MED ORDER — HEPARIN (PORCINE) IN NACL 1000-0.9 UT/500ML-% IV SOLN
INTRAVENOUS | Status: AC
  Filled 2021-03-28: qty 1000

## 2021-03-28 MED ORDER — HEPARIN (PORCINE) IN NACL 1000-0.9 UT/500ML-% IV SOLN
INTRAVENOUS | Status: AC
  Filled 2021-03-28: qty 500

## 2021-03-28 MED ORDER — HYDRALAZINE HCL 20 MG/ML IJ SOLN: 10.0000 mg | INTRAMUSCULAR | Status: AC | PRN

## 2021-03-28 MED ORDER — HEPARIN (PORCINE) IN NACL 1000-0.9 UT/500ML-% IV SOLN
INTRAVENOUS | Status: DC | PRN
  Administered 2021-03-28 (×2): 500 mL

## 2021-03-28 SURGICAL SUPPLY — 19 items
BALLN SAPPHIRE 2.5X12 (BALLOONS) ×2
BALLN SAPPHIRE ~~LOC~~ 3.75X12 (BALLOONS) ×2 IMPLANT
BALLOON SAPPHIRE 2.5X12 (BALLOONS) IMPLANT
CATH 5FR JL3.5 JR4 ANG PIG MP (CATHETERS) ×1 IMPLANT
CATH LAUNCHER 5F EBU4.0 (CATHETERS) ×1 IMPLANT
DEVICE RAD COMP TR BAND LRG (VASCULAR PRODUCTS) ×1 IMPLANT
GLIDESHEATH SLEND A-KIT 6F 22G (SHEATH) ×1 IMPLANT
GLIDESHEATH SLEND SS 6F .021 (SHEATH) ×1 IMPLANT
GUIDEWIRE INQWIRE 1.5J.035X260 (WIRE) IMPLANT
INQWIRE 1.5J .035X260CM (WIRE) ×2
KIT ENCORE 26 ADVANTAGE (KITS) ×1 IMPLANT
KIT ESSENTIALS PG (KITS) ×1 IMPLANT
KIT HEART LEFT (KITS) ×2 IMPLANT
PACK CARDIAC CATHETERIZATION (CUSTOM PROCEDURE TRAY) ×2 IMPLANT
STENT SYNERGY XD 3.50X20 (Permanent Stent) IMPLANT
SYNERGY XD 3.50X20 (Permanent Stent) ×2 IMPLANT
TRANSDUCER W/STOPCOCK (MISCELLANEOUS) ×3 IMPLANT
TUBING CIL FLEX 10 FLL-RA (TUBING) ×2 IMPLANT
WIRE COUGAR XT STRL 190CM (WIRE) ×1 IMPLANT

## 2021-03-28 NOTE — Progress Notes (Signed)
RN paged regarding patients DM2 regimen. He is requesting to take his home tresiba 25 units bid (next dose 2200) along with metformin 1000 mg PO bid (next dose 2200). Last dose trulicity Sunday. Has not had hypoglycemic episode in >8 years and is very symptomatic when he is hypoglycemic. A1c (9.4) on 01/11/21. He has eaten dinner tonight but no other meals, last BG (113). Agree with pharmacy that ideally would not give tresiba full dose with already borderline BG (didn't take morning dose), would at most give reduced dose. Discussed with patient both low risks of metformin associated CIN which I am not as concerned about and with non formulary [place]. Since he wants to continue on full dose tresiba I think this is his choice and he understands the risks/benefits of hypoglycemia. He isn't interested in [place] going through the process of registering his home meds and giving reduced doses. If we are able to document a self administered patient dose at 2200 via progress note (as we can't in [facility] since we are not ordering) that would be helpful. RN understands plan and that patient is opting to take non-recommended doses of home administered medications. Will monitor for hypoglycemia.

## 2021-03-28 NOTE — Progress Notes (Signed)
Patient has his home medications in his room.  Patient is adamant that no one is going to take his medications out of his room.  Patient insist that he will be taking his own medications at this pm.  Patient states that he takes [place] 25units and Metformin 1000mg  at night and in the morning.  Patient states that he will be taking these specific medications regardless of what anyone says.  Patient has been informed of the risks. Pharmacy, and MD on call have been notified. Will continue to monitor.    Donah Driver, RN

## 2021-03-28 NOTE — Progress Notes (Signed)
Pharmacy called by RN to assist with discussing policy on home medications with patient. Patient is on [place] and metformin prior to admission which have not been ordered inpatient. Patient reported to RN he will be taking these medications regardless. Most recent glucose 113. Attempted to discuss our policy of storing home medication in main pharmacy and only dispensing patient's home medication if it is nonformulary. Attempted to explain rationale for metformin not being used inpatient and substitution of Lantus for [place] but patient not willing to allow pharmacist to speak. Patient adamant his will take this medications without provider approval. I recommended we contact his provider and again reiterated our policy and asked he please not take these medications without approval and order placed by provider. Discussed with RN who is calling provider to discuss - recommended to initiate SSI and CBG monitoring. Could discuss having patient administer home Tyler Aas if unwilling to take Lantus per our formulary policy (would need to have it ordered and home medication form filled out) but greater concern is due to most recent blood sugar likely need to hold long acting insulin to avoid hypoglycemia unless further CBGs prove otherwise. Please let pharmacy know how we can be of assistance.  Cristela Felt, PharmD Clinical Pharmacist

## 2021-03-28 NOTE — Interval H&P Note (Signed)
Cath Lab Visit (complete for each Cath Lab visit)  Clinical Evaluation Leading to the Procedure:   ACS: No.  Non-ACS:    Anginal Classification: CCS III  Anti-ischemic medical therapy: Minimal Therapy (1 class of medications)  Non-Invasive Test Results: Low-risk stress test findings: cardiac mortality <1%/year  Prior CABG: No previous CABG      History and Physical Interval Note:  03/28/2021 2:57 PM  Jerry Shaffer  has presented today for surgery, with the diagnosis of angina.  The various methods of treatment have been discussed with the patient and family. After consideration of risks, benefits and other options for treatment, the patient has consented to  Procedure(s): LEFT HEART CATH AND CORONARY ANGIOGRAPHY (N/A) as a surgical intervention.  The patient's history has been reviewed, patient examined, no change in status, stable for surgery.  I have reviewed the patient's chart and labs.  Questions were answered to the patient's satisfaction.     Jerry Shaffer

## 2021-03-29 ENCOUNTER — Encounter: Payer: Self-pay | Admitting: *Deleted

## 2021-03-29 ENCOUNTER — Encounter (HOSPITAL_COMMUNITY): Payer: Self-pay | Admitting: Cardiovascular Disease

## 2021-03-29 DIAGNOSIS — Z8249 Family history of ischemic heart disease and other diseases of the circulatory system: Secondary | ICD-10-CM | POA: Diagnosis not present

## 2021-03-29 DIAGNOSIS — Z833 Family history of diabetes mellitus: Secondary | ICD-10-CM | POA: Diagnosis not present

## 2021-03-29 DIAGNOSIS — I700 Atherosclerosis of aorta: Secondary | ICD-10-CM | POA: Diagnosis not present

## 2021-03-29 DIAGNOSIS — E119 Type 2 diabetes mellitus without complications: Secondary | ICD-10-CM | POA: Diagnosis not present

## 2021-03-29 DIAGNOSIS — Z006 Encounter for examination for normal comparison and control in clinical research program: Secondary | ICD-10-CM

## 2021-03-29 DIAGNOSIS — Z7902 Long term (current) use of antithrombotics/antiplatelets: Secondary | ICD-10-CM | POA: Diagnosis not present

## 2021-03-29 DIAGNOSIS — Z8673 Personal history of transient ischemic attack (TIA), and cerebral infarction without residual deficits: Secondary | ICD-10-CM | POA: Diagnosis not present

## 2021-03-29 DIAGNOSIS — I25119 Atherosclerotic heart disease of native coronary artery with unspecified angina pectoris: Secondary | ICD-10-CM | POA: Diagnosis not present

## 2021-03-29 DIAGNOSIS — Z7982 Long term (current) use of aspirin: Secondary | ICD-10-CM | POA: Diagnosis not present

## 2021-03-29 DIAGNOSIS — R12 Heartburn: Secondary | ICD-10-CM | POA: Diagnosis not present

## 2021-03-29 DIAGNOSIS — Z79899 Other long term (current) drug therapy: Secondary | ICD-10-CM | POA: Diagnosis not present

## 2021-03-29 DIAGNOSIS — Z7984 Long term (current) use of oral hypoglycemic drugs: Secondary | ICD-10-CM | POA: Diagnosis not present

## 2021-03-29 DIAGNOSIS — I252 Old myocardial infarction: Secondary | ICD-10-CM | POA: Diagnosis not present

## 2021-03-29 DIAGNOSIS — Z87891 Personal history of nicotine dependence: Secondary | ICD-10-CM | POA: Diagnosis not present

## 2021-03-29 DIAGNOSIS — E785 Hyperlipidemia, unspecified: Secondary | ICD-10-CM | POA: Diagnosis not present

## 2021-03-29 DIAGNOSIS — Z955 Presence of coronary angioplasty implant and graft: Secondary | ICD-10-CM | POA: Diagnosis not present

## 2021-03-29 DIAGNOSIS — Z880 Allergy status to penicillin: Secondary | ICD-10-CM | POA: Diagnosis not present

## 2021-03-29 LAB — CBC
HCT: 40.4 % (ref 39.0–52.0)
Hemoglobin: 13.1 g/dL (ref 13.0–17.0)
MCH: 28.6 pg (ref 26.0–34.0)
MCHC: 32.4 g/dL (ref 30.0–36.0)
MCV: 88.2 fL (ref 80.0–100.0)
Platelets: 212 10*3/uL (ref 150–400)
RBC: 4.58 MIL/uL (ref 4.22–5.81)
RDW: 13.7 % (ref 11.5–15.5)
WBC: 8.9 10*3/uL (ref 4.0–10.5)
nRBC: 0 % (ref 0.0–0.2)

## 2021-03-29 LAB — BASIC METABOLIC PANEL
Anion gap: 6 (ref 5–15)
BUN: 14 mg/dL (ref 6–20)
CO2: 24 mmol/L (ref 22–32)
Calcium: 8.2 mg/dL — ABNORMAL LOW (ref 8.9–10.3)
Chloride: 108 mmol/L (ref 98–111)
Creatinine, Ser: 0.59 mg/dL — ABNORMAL LOW (ref 0.61–1.24)
GFR, Estimated: >60 mL/min (ref >60)
Glucose, Bld: 142 mg/dL — ABNORMAL HIGH (ref 70–99)
Potassium: 3.6 mmol/L (ref 3.5–5.1)
Sodium: 138 mmol/L (ref 135–145)

## 2021-03-29 LAB — GLUCOSE, CAPILLARY: Glucose-Capillary: 111 mg/dL — ABNORMAL HIGH (ref 70–99)

## 2021-03-29 MED ORDER — METFORMIN HCL ER 500 MG PO TB24: 2000.0000 mg | ORAL_TABLET | Freq: Every day | ORAL | 3 refills | Status: DC

## 2021-03-29 MED FILL — Heparin Sod (Porcine)-NaCl IV Soln 1000 Unit/500ML-0.9%: INTRAVENOUS | Qty: 500 | Status: AC

## 2021-03-29 NOTE — Progress Notes (Signed)
Thanks, Mike.

## 2021-03-29 NOTE — Progress Notes (Signed)
Patient states that he took his home medications of  Metformin 1000mg  and 25U of Tresiba.  MD is aware.  Will continue to monitor.   Donah Driver, RN

## 2021-03-29 NOTE — Progress Notes (Signed)
Discharge instructions (including medications) discussed with and copy provided to patient/caregiver 

## 2021-03-29 NOTE — Plan of Care (Signed)
  Problem: Education: Goal: Knowledge of General Education information will improve Description: Including pain rating scale, medication(s)/side effects and non-pharmacologic comfort measures Outcome: Adequate for Discharge   

## 2021-03-29 NOTE — Research (Signed)
[facility] V9  Patient came in for a scheduled heart catherization due to some chest pains that had been intermittent since March 01 2021.  Patient a nuclear stress test on March 25.  Decided to do a heart catherization. Dr Burt Knack preformed                                     "CONSENT"   YES     NO   Continuing further Investigational Product and study visits for follow-up? [x]  []   Continuing consent from future biomedical research [x]  []                                   "EVENTS"    YES     NO  AE   (IF YES SEE SOURCE) [x]  []   SAE  (IF YES SEE SOURCE) []  [x]   ENDPOINT   (IF YES SEE SOURCE) []  [x]   REVASCULARIZATION  (IF YES SEE SOURCE) [x]  []   AMPUTATION   (IF YES SEE SOURCE) []  [x]   TROPONIN'S  (IF YES SEE SOURCE) []  [x]     Lifestyle Adherence Assessment:   YES NO  Abstinence from smoking/remaining tobacco free X   Cardiac Diet  X  Routine physical activity and/or cardiac rehabilitation  X    Current Outpatient Medications:    acetaminophen (TYLENOL) 500 MG tablet, Take 1,000 mg by mouth every 6 (six) hours as needed for mild pain or headache. , Disp: , Rfl:    aspirin 81 MG chewable tablet, Chew 1 tablet (81 mg total) by mouth daily., Disp: , Rfl:    atorvastatin (LIPITOR) 80 MG tablet, Take 1 tablet (80 mg total) by mouth daily., Disp: 30 tablet, Rfl: 11   carvedilol (COREG) 6.25 MG tablet, Take 1 tablet (6.25 mg total) by mouth 2 (two) times daily with a meal., Disp: 60 tablet, Rfl: 11   clopidogrel (PLAVIX) 75 MG tablet, Take 1 tablet (75 mg total) by mouth daily with breakfast., Disp: 30 tablet, Rfl: 11   Continuous Blood Gluc Sensor (FREESTYLE LIBRE 2 SENSOR) MISC, 1 Device by Does not apply route every 14 (fourteen) days., Disp: 6 each, Rfl: 3   Dulaglutide (TRULICITY) 1.61 WR/6.0AV SOPN, Inject 0.75 mg into the skin once a week. (Patient taking differently: Inject 0.75 mg into the skin once a week. Sunday), Disp: 6 mL, Rfl: 3   EPINEPHrine 0.3 mg/0.3 mL IJ SOAJ injection,  Inject 0.3 mg into the muscle as needed for anaphylaxis., Disp: 1 each, Rfl: 0   Fluticasone-Salmeterol (ADVAIR DISKUS) 250-50 MCG/DOSE AEPB, Take 1 puff twice daily.  Use regularly, not a rescue inhaler. (Patient taking differently: Inhale 1 puff into the lungs daily as needed (shortness of breath).), Disp: 60 each, Rfl: 0   Ibuprofen (ADVIL) 200 MG CAPS, Take 400 mg by mouth every 8 (eight) hours as needed (for pain). , Disp: , Rfl:    insulin degludec (TRESIBA FLEXTOUCH) 100 UNIT/ML FlexTouch Pen, Inject 40 Units into the skin daily. (Patient taking differently: Inject 50 Units into the skin daily.), Disp: 45 mL, Rfl: 3   Insulin Pen Needle 32G X 4 MM MISC, Inject 20 units insulin daily, Disp: 100 each, Rfl: 0   [START ON 03/31/2021] metFORMIN (GLUCOPHAGE-XR) 500 MG 24 hr tablet, Take 4 tablets (2,000 mg total) by mouth daily with breakfast., Disp: 360 tablet, Rfl: 3  nitroGLYCERIN (NITROSTAT) 0.4 MG SL tablet, Place 1 tablet (0.4 mg total) under the tongue every 5 (five) minutes x 3 doses as needed for chest pain., Disp: 25 tablet, Rfl: 3 No current facility-administered medications for this visit.  Facility-Administered Medications Ordered in Other Visits:    0.9 %  sodium chloride infusion, 250 mL, Intravenous, PRN, [name], MD   acetaminophen (TYLENOL) tablet 650 mg, 650 mg, Oral, Q4H PRN, [name], MD   aspirin chewable tablet 81 mg, 81 mg, Oral, Daily, [name], MD, 81 mg at 03/29/21 0957   atorvastatin (LIPITOR) tablet 80 mg, 80 mg, Oral, Daily, [name], MD, 80 mg at 03/29/21 0957   carvedilol (COREG) tablet 6.25 mg, 6.25 mg, Oral, BID WC, [name], MD, 6.25 mg at 03/29/21 0957   clopidogrel (PLAVIX) tablet 75 mg, 75 mg, Oral, Q breakfast, [name], MD, 75 mg at 03/29/21 0957   morphine 2 MG/ML injection 2 mg, 2 mg, Intravenous, Q1H PRN, [name], MD   nitroGLYCERIN (NITROSTAT) SL tablet 0.4 mg, 0.4 mg, Sublingual, Q5 Min x 3 PRN, [name], MD   ondansetron [facility]) injection 4 mg, 4 mg, Intravenous, Q6H PRN, [name], MD   oxyCODONE (Oxy IR/ROXICODONE) immediate release tablet 5-10 mg, 5-10 mg, Oral, Q4H PRN, [name], MD   sodium chloride flush (NS) 0.9 % injection 3 mL, 3 mL, Intravenous, Q12H, Croitoru, Mihai, MD   sodium chloride flush (NS) 0.9 % injection 3 mL, 3 mL, Intravenous, Q12H, [name], MD   sodium chloride flush (NS) 0.9 % injection 3 mL, 3 mL, Intravenous, PRN, [name], MD

## 2021-03-29 NOTE — Discharge Summary (Addendum)
Discharge Summary    Patient ID: Jerry Shaffer MRN: [id number]; DOB: 1961-05-14  Admit date: 03/28/2021 Discharge date: 03/29/2021  PCP:  Patient, No Pcp Per (Inactive)   [place]  Cardiologist:  Sanda Klein, MD    Discharge Diagnoses    Active Problems:   Coronary artery disease involving native coronary artery with angina pectoris [facility])   Coronary artery disease involving native coronary artery of native heart with angina pectoris ([place]) Diabetes mellitus Morbid obesity Hyperlipidemia  Diagnostic Studies/Procedures    CORONARY STENT INTERVENTION  LEFT HEART CATH AND CORONARY ANGIOGRAPHY    Conclusion  1.  Patent left main 2.  Severe stenosis of the proximal LAD, new from cardiac catheterization 6 months ago, treated successfully with PCI using a 3.5 x 20 mm Synergy DES.  The mid LAD is unchanged from the prior study with [place] 0, 1, 1 bifurcation disease with only moderate stenosis in the LAD and mild stenosis in the diagonal 3.  Moderate 50% mid circumflex stenosis 4.  Continued patency of the right PDA stent with mild diffuse nonobstructive PDA and RCA stenosis 5.  Normal LVEDP  Recommendations: Continue dual antiplatelet therapy with aspirin and clopidogrel at least 6 months without interruption.  Should be eligible for hospital discharge tomorrow.     Diagnostic Dominance: Right    Intervention       History of Present Illness     Jerry Shaffer is a 60 y.o. male with hx of coronary artery disease presenting with inferior NSTEMI on September 20, 2020 due to occlusion of the mid PDA, also known to have aortic atherosclerosis,  migraine headaches, dyslipidemia (low HDL), type 2 diabetes mellitus, adult ADHD, chronic smoker presented for outpatient cardiac catheterization.  Cardiac catheterization performed 09/20/2020 showed the complete occlusion of the large PDA that reached to the apex which was treated with a drug-eluting  stent (18 x 2.75 mm Onyx).  Also noted at that time was a bifurcation lesion of the LAD-first diagonal artery (70% LAD, 40% diagonal), and a 40-50% stenosis in the large OM 2 branch of the left circumflex.  LVEF was minimally depressed at cath at 45-50% and filling pressures were normal.  The echo did not show regional wall motion abnormality and EF was 55-60%.    On March 18 patient had severe chest discomfort while carrying heavy equipment at work.  Symptoms similar to prior infection.  Recurrent symptoms with minimal activity afterwards. He underwent a Lexiscan Myoview study 03/08/2021.  This showed a fixed inferior wall defect (actually worse on the rest images compared to stress), interpreted as representing diaphragmatic attenuation.  There is no obvious reversible ischemia.  Given symptoms concerning for unstable angina cardiac catheterization recommended.  Hospital Course     Consultants: None  Cardiac catheterization showed patent right PDA stent. Severe stenosis of the proximal LAD, new from cardiac catheterization 6 months ago, treated successfully with PCI using a 3.5 x 20 mm Synergy DES.  The mid LAD is unchanged from the prior study with [place] 0, 1, 1 bifurcation disease with only moderate stenosis in the LAD and mild stenosis in the diagonal.  Plan to continue dual antiplatelet therapy with aspirin and Plavix.  No overnight complication.  Renal function remained stable.  No recurrent chest pain.  Felt stable for discharge after ambulation with cardiac rehab.  No change in home medications.  The patient been seen by Dr. Angelena Form today and deemed ready for discharge home. All follow-up appointments have  been scheduled. Discharge medications are listed below.    Did the patient have an acute coronary syndrome (MI, NSTEMI, STEMI, etc) this admission?:  No                               Did the patient have a percutaneous coronary intervention (stent / angioplasty)?:  No.    Discharge  Vitals Blood pressure 112/74, pulse 84, temperature 98.8 F (37.1 C), temperature source Oral, resp. rate 18, height 5\' 8"  (1.727 m), weight 108.2 kg, SpO2 96 %.  Filed Weights   03/28/21 1148 03/29/21 0546  Weight: 105.2 kg 108.2 kg   Physical Exam Constitutional:      Appearance: Normal appearance.  HENT:     Head: Normocephalic and atraumatic.     Nose: Nose normal.  Eyes:     Extraocular Movements: Extraocular movements intact.     Pupils: Pupils are equal, round, and reactive to light.  Cardiovascular:     Rate and Rhythm: Normal rate and regular rhythm.     Pulses: Normal pulses.     Heart sounds: Normal heart sounds.  Pulmonary:     Effort: Pulmonary effort is normal.     Breath sounds: Normal breath sounds.  Abdominal:     General: Abdomen is flat.     Palpations: Abdomen is soft.  Musculoskeletal:        General: Normal range of motion.     Cervical back: Normal range of motion.  Skin:    General: Skin is warm and dry.  Neurological:     General: No focal deficit present.     Mental Status: He is alert and oriented to person, place, and time.  Psychiatric:        Mood and Affect: Mood normal.        Behavior: Behavior normal.    Labs & Radiologic Studies    CBC Recent Labs    03/27/21 0852 03/29/21 0334  WBC 8.8 8.9  HGB 13.5 13.1  HCT 41.2 40.4  MCV 87 88.2  PLT 232 962   Basic Metabolic Panel Recent Labs    03/27/21 0852 03/29/21 0334  NA 140 138  K 4.2 3.6  CL 102 108  CO2 22 24  GLUCOSE 116* 142*  BUN 12 14  CREATININE 0.72* 0.59*  CALCIUM 9.3 8.2*   CARDIAC CATHETERIZATION  Result Date: 03/28/2021 1.  Patent left main 2.  Severe stenosis of the proximal LAD, new from cardiac catheterization 6 months ago, treated successfully with PCI using a 3.5 x 20 mm Synergy DES.  The mid LAD is unchanged from the prior study with [place] 0, 1, 1 bifurcation disease with only moderate stenosis in the LAD and mild stenosis in the diagonal 3.  Moderate  50% mid circumflex stenosis 4.  Continued patency of the right PDA stent with mild diffuse nonobstructive PDA and RCA stenosis 5.  Normal LVEDP Recommendations: Continue dual antiplatelet therapy with aspirin and clopidogrel at least 6 months without interruption.  Should be eligible for hospital discharge tomorrow.    MYOCARDIAL PERFUSION IMAGING  Result Date: 03/08/2021  Nuclear stress EF is calculated at 53% but visually appears to be at least 55-60%.  There was no ST segment deviation noted during stress.  There is a medium defect of moderate severity present in the basal inferoseptal, basal inferior, mid inferior and apical inferior location. The defect is non-reversible and worse on resting images.  In the setting of normal LVF, this is most consistent with diaphragmatic attenuation artifact. No ischemia noted.  This is a low risk study.    Disposition   Pt is being discharged home today in good condition.  Follow-up Plans & Appointments     Follow-up Information    Croitoru, Mihai, MD Follow up.   Specialty: Cardiology Why: Office will call with date and time of appointment. Contact information: [address] [place] [address] [phone]              Discharge Instructions    Diet - low sodium heart healthy   Complete by: As directed    Discharge instructions   Complete by: As directed    No driving for 48 hours. No lifting over 5 lbs for 1 week. No sexual activity for 1 week. You may return to work on 04/02/2021. Keep procedure site clean & dry. If you notice increased pain, swelling, bleeding or pus, call/return!  You may shower, but no soaking baths/hot tubs/pools for 1 week.   Increase activity slowly   Complete by: As directed       Discharge Medications   Allergies as of 03/29/2021      Reactions   Bee Venom Anaphylaxis   Penicillins Anaphylaxis   Did it involve swelling of the face/tongue/throat, SOB, or low BP? yes Did it involve  sudden or severe rash/hives, skin peeling, or any reaction on the inside of your mouth or nose? unknown Did you need to seek medical attention at a hospital or doctor's office? yes When did it last happen?1967 If all above answers are "NO", may proceed with cephalosporin use.   Procaine Anaphylaxis      Medication List    TAKE these medications   acetaminophen 500 MG tablet Commonly known as: TYLENOL Take 1,000 mg by mouth every 6 (six) hours as needed for mild pain or headache.   Advil 200 MG Caps Generic drug: Ibuprofen Take 400 mg by mouth every 8 (eight) hours as needed (for pain).   aspirin 81 MG chewable tablet Chew 1 tablet (81 mg total) by mouth daily.   atorvastatin 80 MG tablet Commonly known as: LIPITOR Take 1 tablet (80 mg total) by mouth daily.   carvedilol 6.25 MG tablet Commonly known as: COREG Take 1 tablet (6.25 mg total) by mouth 2 (two) times daily with a meal.   clopidogrel 75 MG tablet Commonly known as: PLAVIX Take 1 tablet (75 mg total) by mouth daily with breakfast.   EPINEPHrine 0.3 mg/0.3 mL Soaj injection Commonly known as: EPI-PEN Inject 0.3 mg into the muscle as needed for anaphylaxis.   Fluticasone-Salmeterol 250-50 MCG/DOSE Aepb Commonly known as: Advair Diskus Take 1 puff twice daily.  Use regularly, not a rescue inhaler. What changed:   how much to take  how to take this  when to take this  reasons to take this  additional instructions   FreeStyle Libre 2 Sensor Misc 1 Device by Does not apply route every 14 (fourteen) days.   Insulin Pen Needle 32G X 4 MM Misc Inject 20 units insulin daily   metFORMIN 500 MG 24 hr tablet Commonly known as: GLUCOPHAGE-XR Take 4 tablets (2,000 mg total) by mouth daily with breakfast. Start taking on: March 31, 2021 What changed: These instructions start on March 31, 2021. If you are unsure what to do until then, ask your doctor or other care provider.   nitroGLYCERIN 0.4 MG SL  tablet Commonly  known as: NITROSTAT Place 1 tablet (0.4 mg total) under the tongue every 5 (five) minutes x 3 doses as needed for chest pain.   Tyler Aas FlexTouch 100 UNIT/ML FlexTouch Pen Generic drug: insulin degludec Inject 40 Units into the skin daily. What changed: how much to take   Trulicity 7.22 VJ/5.0NX Sopn Generic drug: Dulaglutide Inject 0.75 mg into the skin once a week. What changed: additional instructions          Outstanding Labs/Studies   None  Duration of Discharge Encounter   Greater than 30 minutes including physician time.  SignedLeanor Kail, PA 03/29/2021, 8:00 AM   I have personally seen and examined this patient. I agree with the assessment and plan as outlined above.  He is doing well post PCI of the LAD. No chest pain. Right wrist cath site ok. Will continue ASA/Plavix for at least six months.  Discharge home today.   Lauree Chandler 03/29/2021 9:14 AM

## 2021-03-29 NOTE — Progress Notes (Signed)
CARDIAC REHAB PHASE I   PRE:  Rate/Rhythm: 101 ST  BP:  Supine:   Sitting: 112/74  Standing:    SaO2: 98%RA  MODE:  Ambulation: 470 ft   POST:  Rate/Rhythm: 104 ST  BP:  Supine:   Sitting: 122/88  Standing:    SaO2: 98%RA 0805-0852 Pt walked 470 ft on RA with steady gait and tolerated well except c/o slight tightness. Was not located in same area as discomfort that brought him in.  It improved with rest. Pt stated he felt like it would improve with breakfast that just arrived. Cardiology aware. Brief ed with pt as we saw in October. Reviewed importance of plavix, reviewed NTG use, gave walking suggestions, and left heart healthy and diabetic diets. Pt given tobacco cessation handout. He knows importance of trying to decrease nicotine. Referral to [place] [facility] made. Pt not interested in attending. Will walk for ex.    Graylon Good, RN BSN  03/29/2021 8:48 AM

## 2021-04-02 ENCOUNTER — Telehealth (HOSPITAL_COMMUNITY): Payer: Self-pay

## 2021-04-02 NOTE — Telephone Encounter (Signed)
Per white card from Phase I, "Don't call-can't afford".  Closed referral

## 2021-04-19 ENCOUNTER — Ambulatory Visit: Payer: BC Managed Care – PPO | Admitting: Endocrinology

## 2021-04-19 ENCOUNTER — Other Ambulatory Visit: Payer: Self-pay

## 2021-04-19 VITALS — BP 140/84 | HR 66 | Ht 68.0 in | Wt 244.2 lb

## 2021-04-19 DIAGNOSIS — E1159 Type 2 diabetes mellitus with other circulatory complications: Secondary | ICD-10-CM | POA: Diagnosis not present

## 2021-04-19 LAB — POCT GLYCOSYLATED HEMOGLOBIN (HGB A1C): Hemoglobin A1C: 6.1 % — AB (ref 4.0–5.6)

## 2021-04-19 MED ORDER — TRULICITY 1.5 MG/0.5ML ~~LOC~~ SOAJ: 1.5000 mg | SUBCUTANEOUS | 3 refills | Status: DC

## 2021-04-19 MED ORDER — TRESIBA FLEXTOUCH 100 UNIT/ML ~~LOC~~ SOPN: 30.0000 [IU] | PEN_INJECTOR | Freq: Every day | SUBCUTANEOUS | 3 refills | Status: DC

## 2021-04-19 NOTE — Patient Instructions (Addendum)
Your blood pressure is high today.  Please see your heart doctor soon, to have it rechecked.   check your blood sugar twice a day.  vary the time of day when you check, between before the 3 meals, and at bedtime.  also check if you have symptoms of your blood sugar being too high or too low.  please keep a record of the readings and bring it to your next appointment here (or you can bring the meter itself).  You can write it on any piece of paper.  please call us sooner if your blood sugar goes below 70, or if you have a lot of readings over 200.  I have sent 2 prescriptions to your pharmacy, to increase the Trulicity, and decrease the [place].   Please continue the same other medications.   Please come back for a follow-up appointment in 2 months.

## 2021-04-19 NOTE — Progress Notes (Signed)
Subjective:    Patient ID: Jerry Shaffer, male    DOB: Oct 20, 1961, 60 y.o.   MRN: [id number]  HPI Pt returns for f/u of diabetes mellitus: DM type: Insulin-requiring type 2 DM: Dx'ed: 4098 Complications: CAD and TIA Therapy: insulin since 2021, and metformin.   DKA: never Severe hypoglycemia: never Pancreatitis: never Pancreatic imaging: normal on 2021 CT SDOH: he works [employer], 3rd shift.  Other: pt says this was precip by Seroquel; he also took insulin 2016-2017; he declines multiple daily injections.  Interval history: He has increased insulin to 50/d.  no cbg record, but states cbg's vary from 97-235.  It is in general higher as the day goes on.    Past Medical History:  Diagnosis Date  . Bipolar 1 disorder ([place])   . Coronary artery disease   . Diabetes mellitus   . Dyslipidemia   . Emphysema   . Mental disorder    BIPOLAR DISORDER  . Migraine   . NSTEMI (non-ST elevated myocardial infarction) ([place]) 09/20/2020  . PONV (postoperative nausea and vomiting)   . Renal disorder   . Shortness of breath   . Stroke ([place])   . Transient ischemic attack (TIA)     Past Surgical History:  Procedure Laterality Date  . CORONARY ANGIOGRAPHY  09/20/2020  . CORONARY STENT INTERVENTION  09/20/2020   CORONARY STENT INTERVENTION  . CORONARY STENT INTERVENTION N/A 09/20/2020   Procedure: CORONARY STENT INTERVENTION;  Surgeon: Belva Crome, MD;  Location: [place] [facility];  Service: Cardiovascular;  Laterality: N/A;  . CORONARY STENT INTERVENTION N/A 03/28/2021   Procedure: CORONARY STENT INTERVENTION;  Surgeon: [name], MD;  Location: [place] [facility];  Service: Cardiovascular;  Laterality: N/A;  . HEMORROIDECTOMY    . KNEE ARTHROSCOPY    . LEFT HEART CATH AND CORONARY ANGIOGRAPHY N/A 09/20/2020   Procedure: LEFT HEART CATH AND CORONARY ANGIOGRAPHY;  Surgeon: Belva Crome, MD;  Location: [place] [facility];  Service: Cardiovascular;  Laterality: N/A;  . LEFT HEART CATH  AND CORONARY ANGIOGRAPHY N/A 03/28/2021   Procedure: LEFT HEART CATH AND CORONARY ANGIOGRAPHY;  Surgeon: [name], MD;  Location: [place] [facility];  Service: Cardiovascular;  Laterality: N/A;  . LUMBAR [place]    . TONSILLECTOMY AND ADENOIDECTOMY      Social History   Socioeconomic History  . Marital status: Single    Spouse name: Not on file  . Number of children: Not on file  . Years of education: Not on file  . Highest education level: Not on file  Occupational History  . Occupation: Financial controller: [employer]  Tobacco Use  . Smoking status: Former Smoker    Years: 7.00    Types: E-cigarettes  . Smokeless tobacco: Current User  Vaping Use  . Vaping Use: Never used  Substance and Sexual Activity  . Alcohol use: No  . Drug use: No  . Sexual activity: Not Currently  Other Topics Concern  . Not on file  Social History Narrative  . Not on file   Social Determinants of Health   Financial Resource Strain: Not on file  Food Insecurity: Not on file  Transportation Needs: Not on file  Physical Activity: Not on file  Stress: Not on file  Social Connections: Not on file  Intimate Partner Violence: Not on file    Current Outpatient Medications on File Prior to Visit  Medication Sig Dispense Refill  . acetaminophen (TYLENOL) 500  MG tablet Take 1,000 mg by mouth every 6 (six) hours as needed for mild pain or headache.     Marland Kitchen aspirin 81 MG chewable tablet Chew 1 tablet (81 mg total) by mouth daily.    Marland Kitchen atorvastatin (LIPITOR) 80 MG tablet Take 1 tablet (80 mg total) by mouth daily. 30 tablet 11  . carvedilol (COREG) 6.25 MG tablet Take 1 tablet (6.25 mg total) by mouth 2 (two) times daily with a meal. 60 tablet 11  . clopidogrel (PLAVIX) 75 MG tablet Take 1 tablet (75 mg total) by mouth daily with breakfast. 30 tablet 11  . Continuous Blood Gluc Sensor (FREESTYLE LIBRE 2 SENSOR) MISC 1 Device by Does not apply route every 14 (fourteen) days. 6 each  3  . EPINEPHrine 0.3 mg/0.3 mL IJ SOAJ injection Inject 0.3 mg into the muscle as needed for anaphylaxis. 1 each 0  . Fluticasone-Salmeterol (ADVAIR DISKUS) 250-50 MCG/DOSE AEPB Take 1 puff twice daily.  Use regularly, not a rescue inhaler. (Patient taking differently: Inhale 1 puff into the lungs daily as needed (shortness of breath).) 60 each 0  . Ibuprofen (ADVIL) 200 MG CAPS Take 400 mg by mouth every 8 (eight) hours as needed (for pain).     . Insulin Pen Needle 32G X 4 MM MISC Inject 20 units insulin daily 100 each 0  . metFORMIN (GLUCOPHAGE-XR) 500 MG 24 hr tablet Take 4 tablets (2,000 mg total) by mouth daily with breakfast. 360 tablet 3  . nitroGLYCERIN (NITROSTAT) 0.4 MG SL tablet Place 1 tablet (0.4 mg total) under the tongue every 5 (five) minutes x 3 doses as needed for chest pain. 25 tablet 3   No current facility-administered medications on file prior to visit.    Allergies  Allergen Reactions  . Bee Venom Anaphylaxis  . Penicillins Anaphylaxis    Did it involve swelling of the face/tongue/throat, SOB, or low BP? yes Did it involve sudden or severe rash/hives, skin peeling, or any reaction on the inside of your mouth or nose? unknown Did you need to seek medical attention at a hospital or doctor's office? yes When did it last happen?1967 If all above answers are "NO", may proceed with cephalosporin use.   . Procaine Anaphylaxis    Family History  Problem Relation Age of Onset  . Diabetes type II Other   . Coronary artery disease Other   . Bipolar disorder Other   . Lung cancer Father   . CAD Mother 39    BP 140/84 (BP Location: Right Arm, Patient Position: Sitting, Cuff Size: Large)   Pulse 66   Ht 5\' 8"  (1.727 m)   Wt 244 lb 3.2 oz (110.8 kg)   SpO2 97%   BMI 37.13 kg/m    Review of Systems He denies hypoglycemia/n/v.      Objective:   Physical Exam VITAL SIGNS:  See vs page GENERAL: no distress Pulses: dorsalis pedis intact bilat.   MSK: no  deformity of the feet CV: no leg edema Skin:  no ulcer on the feet.  normal color and temp on the feet. Neuro: sensation is intact to touch on the feet   A1c=6.1%     Assessment & Plan:  Insulin-requiring type 2 DM: overcontrolled.  We'll favor GLP rx over insulin.   Patient Instructions  Your blood pressure is high today.  Please see your heart doctor soon, to have it rechecked.   check your blood sugar twice a day.  vary the time of day  when you check, between before the 3 meals, and at bedtime.  also check if you have symptoms of your blood sugar being too high or too low.  please keep a record of the readings and bring it to your next appointment here (or you can bring the meter itself).  You can write it on any piece of paper.  please call us sooner if your blood sugar goes below 70, or if you have a lot of readings over 200.  I have sent 2 prescriptions to your pharmacy, to increase the Trulicity, and decrease the [place].   Please continue the same other medications.   Please come back for a follow-up appointment in 2 months.

## 2021-04-24 ENCOUNTER — Encounter: Payer: Self-pay | Admitting: Gastroenterology

## 2021-04-24 ENCOUNTER — Other Ambulatory Visit: Payer: Self-pay

## 2021-04-24 ENCOUNTER — Ambulatory Visit: Payer: BC Managed Care – PPO | Admitting: Gastroenterology

## 2021-04-24 VITALS — BP 128/72 | HR 80 | Ht 68.0 in | Wt 244.0 lb

## 2021-04-24 DIAGNOSIS — Z1211 Encounter for screening for malignant neoplasm of colon: Secondary | ICD-10-CM

## 2021-04-24 NOTE — Patient Instructions (Signed)
If you are age 59 or younger, your body mass index should be between 19-25. Your Body mass index is 37.1 kg/m. If this is out of the aformentioned range listed, please consider follow up with your Primary Care Provider.   You will follow up with our office in September 2022.  We will contact you to schedule appointment.  Thank you for entrusting me with your care and choosing [facility].  Dr Ardis Hughs

## 2021-04-24 NOTE — Progress Notes (Signed)
HPI: This is a 60 year old man whom I am meeting again for the first time in about 11 years.  He is here to discuss colon cancer screening.   I did a colonoscopy for Dr. Verl Blalock because she needed deeper sedation in September 2011.  This was for left-sided abdominal pain.  Found mild diverticulosis in the left colon.  I found a single subcentimeter polyp which was not precancerous.  I recommended repeat colonoscopy at 10-year interval for routine risk screening.  She was to follow-up with Dr. Verl Blalock as needed.  Blood work April 2022 shows she is not anemic.  She underwent coronary angiogram last month with Dr. Burt Knack and had a drug-eluting stent placed in one of her coronary arteries.  She is to remain on dual antiplatelet therapy with aspirin and Plavix for least 6 months which would be until October 2022.  He has alternating constipation diarrhea and this is the way he has been moving his bowels since he was 60 years old.  He blames of barium enema and barium esophagram on most of his gastrointestinal misery is now.  Colon cancer does not run in his family  He has gained 22 pounds in the last 6 months.  He says this is because he cannot exercise anymore.   Review of systems: Pertinent positive and negative review of systems were noted in the above HPI section. All other review negative.   Past Medical History:  Diagnosis Date  . Bipolar 1 disorder ([place])   . Coronary artery disease   . Diabetes mellitus   . Dyslipidemia   . Emphysema   . Mental disorder    BIPOLAR DISORDER  . Migraine   . NSTEMI (non-ST elevated myocardial infarction) ([place]) 09/20/2020  . PONV (postoperative nausea and vomiting)   . Renal disorder   . Shortness of breath   . Stroke ([place])   . Transient ischemic attack (TIA)     Past Surgical History:  Procedure Laterality Date  . CORONARY ANGIOGRAPHY  09/20/2020  . CORONARY STENT INTERVENTION  09/20/2020   CORONARY STENT INTERVENTION   . CORONARY STENT INTERVENTION N/A 09/20/2020   Procedure: CORONARY STENT INTERVENTION;  Surgeon: Belva Crome, MD;  Location: [facility];  Service: Cardiovascular;  Laterality: N/A;  . CORONARY STENT INTERVENTION N/A 03/28/2021   Procedure: CORONARY STENT INTERVENTION;  Surgeon: [name], MD;  Location: [facility];  Service: Cardiovascular;  Laterality: N/A;  . HEMORROIDECTOMY    . KNEE ARTHROSCOPY    . LEFT HEART CATH AND CORONARY ANGIOGRAPHY N/A 09/20/2020   Procedure: LEFT HEART CATH AND CORONARY ANGIOGRAPHY;  Surgeon: Belva Crome, MD;  Location: [facility];  Service: Cardiovascular;  Laterality: N/A;  . LEFT HEART CATH AND CORONARY ANGIOGRAPHY N/A 03/28/2021   Procedure: LEFT HEART CATH AND CORONARY ANGIOGRAPHY;  Surgeon: [name], MD;  Location: [facility];  Service: Cardiovascular;  Laterality: N/A;  . LUMBAR [place]    . TONSILLECTOMY AND ADENOIDECTOMY      Current Outpatient Medications  Medication Sig Dispense Refill  . acetaminophen (TYLENOL) 500 MG tablet Take 1,000 mg by mouth every 6 (six) hours as needed for mild pain or headache.     Marland Kitchen aspirin 81 MG chewable tablet Chew 1 tablet (81 mg total) by mouth daily.    Marland Kitchen atorvastatin (LIPITOR) 80 MG tablet Take 1 tablet (80 mg total) by mouth daily. 30 tablet 11  . carvedilol (COREG) 6.25 MG tablet  Take 1 tablet (6.25 mg total) by mouth 2 (two) times daily with a meal. 60 tablet 11  . clopidogrel (PLAVIX) 75 MG tablet Take 1 tablet (75 mg total) by mouth daily with breakfast. 30 tablet 11  . Continuous Blood Gluc Sensor (FREESTYLE LIBRE 2 SENSOR) MISC 1 Device by Does not apply route every 14 (fourteen) days. 6 each 3  . Dulaglutide (TRULICITY) 1.5 LN/9.8XQ SOPN Inject 1.5 mg into the skin once a week. (Patient taking differently: Inject 3 mg into the skin once a week.) 6 mL 3  . EPINEPHrine 0.3 mg/0.3 mL IJ SOAJ injection Inject 0.3 mg into the muscle as needed for anaphylaxis. 1  each 0  . Fluticasone-Salmeterol (ADVAIR DISKUS) 250-50 MCG/DOSE AEPB Take 1 puff twice daily.  Use regularly, not a rescue inhaler. (Patient taking differently: Inhale 1 puff into the lungs daily as needed (shortness of breath).) 60 each 0  . Ibuprofen (ADVIL) 200 MG CAPS Take 400 mg by mouth every 8 (eight) hours as needed (for pain).     . insulin degludec (TRESIBA FLEXTOUCH) 100 UNIT/ML FlexTouch Pen Inject 30 Units into the skin daily. 30 mL 3  . Insulin Pen Needle 32G X 4 MM MISC Inject 20 units insulin daily 100 each 0  . metFORMIN (GLUCOPHAGE-XR) 500 MG 24 hr tablet Take 4 tablets (2,000 mg total) by mouth daily with breakfast. 360 tablet 3  . nitroGLYCERIN (NITROSTAT) 0.4 MG SL tablet Place 1 tablet (0.4 mg total) under the tongue every 5 (five) minutes x 3 doses as needed for chest pain. 25 tablet 3   No current facility-administered medications for this visit.    Allergies as of 04/24/2021 - Review Complete 04/24/2021  Allergen Reaction Noted  . Bee venom Anaphylaxis 12/15/2012  . Penicillins Anaphylaxis 07/25/2010  . Procaine Anaphylaxis 12/15/2012    Family History  Problem Relation Age of Onset  . Diabetes type II Other   . Coronary artery disease Other   . Bipolar disorder Other   . Lung cancer Father   . CAD Mother 58    Social History   Socioeconomic History  . Marital status: Single    Spouse name: Not on file  . Number of children: Not on file  . Years of education: Not on file  . Highest education level: Not on file  Occupational History  . Occupation: Financial controller: [employer]  Tobacco Use  . Smoking status: Former Smoker    Years: 7.00    Types: E-cigarettes  . Smokeless tobacco: Current User  Vaping Use  . Vaping Use: Never used  Substance and Sexual Activity  . Alcohol use: No  . Drug use: No  . Sexual activity: Not Currently  Other Topics Concern  . Not on file  Social History Narrative  . Not on file   Social  Determinants of Health   Financial Resource Strain: Not on file  Food Insecurity: Not on file  Transportation Needs: Not on file  Physical Activity: Not on file  Stress: Not on file  Social Connections: Not on file  Intimate Partner Violence: Not on file     Physical Exam: BP 128/72   Pulse 80   Ht 5\' 8"  (1.727 m)   Wt 244 lb (110.7 kg)   BMI 37.10 kg/m  Constitutional: generally well-appearing Psychiatric: alert and oriented x3 Eyes: extraocular movements intact Mouth: oral pharynx moist, no lesions Neck: supple no lymphadenopathy Cardiovascular: heart regular rate and rhythm Lungs:  clear to auscultation bilaterally Abdomen: soft, nontender, nondistended, no obvious ascites, no peritoneal signs, normal bowel sounds Extremities: no lower extremity edema bilaterally Skin: no lesions on visible extremities   Assessment and plan: 60 y.o. male with routine risk for colon cancer, chronically alternating bowel habits, recent coronary angiogram and drug-eluting stent placement  We discussed that he is "due" for colon cancer screening however now would not be a safe time for him to undergo colonoscopy given his drug-eluting coronary stent which was placed just 1 month ago.  It would be dangerous for him to come off his dual antiplatelet therapy with aspirin and Plavix prior to around October.  We will get him back in here in the office in September and hopefully he will not have required any further coronary interventions at that point and hopefully it will be safe for him to hold his Plavix for 5 days which is what we require prior to the colonoscopy.   Please see the "Patient Instructions" section for addition details about the plan.   Owens Loffler, MD [facility] 04/24/2021, 10:45 AM  Cc: No ref. provider found  Total time on date of encounter was 40 minutes (this included time spent preparing to see the patient reviewing records; obtaining and/or reviewing  separately obtained history; performing a medically appropriate exam and/or evaluation; counseling and educating the patient and family if present; ordering medications, tests or procedures if applicable; and documenting clinical information in the health record).

## 2021-04-28 ENCOUNTER — Other Ambulatory Visit: Payer: Self-pay

## 2021-04-28 DIAGNOSIS — E1159 Type 2 diabetes mellitus with other circulatory complications: Secondary | ICD-10-CM

## 2021-04-28 MED ORDER — INSULIN PEN NEEDLE 32G X 4 MM MISC: 0 refills | Status: DC

## 2021-05-08 NOTE — Progress Notes (Signed)
Cardiology Office Note   Date:  05/10/2021   ID:  Jerry Shaffer, DOB [birth year], MRN [id number]  PCP:  Patient, No Pcp Per (Inactive)  Cardiologist: Dr.Croitoru  CC: Post Hospitalization    History of Present Illness: Jerry Shaffer is a 60 y.o. male who presents for posthospitalization follow-up after admission for cardiac catheterization for unstable angina.  The patient has a history of coronary artery disease, with history of inferior NSTEMI September 20, 2020 due to occlusion of the mid PDA with aortic atherosclerosis.  Other history includes migraine headaches, dyslipidemia, type 2 diabetes, adult ADHD, and tobacco abuse.  He described a stuttering pattern of chest discomfort on and off for approximately 3 hours while he was fishing.  They also occurred during an emotional outburst requiring him to take nitroglycerin.  Cardiac catheterization was completed on 03/28/2021 by Dr. Burt Knack.  The cath revealed severe stenosis of the proximal LAD, new from cardiac catheterization 6 months prior.  He was treated with PCI using a 3.5 x 20 mm Synergy DES.  It was noted that the mid LAD was unchanged from prior study with a Medina 0, 1, 1 bifurcation, disease was only moderate stenosis in the LAD and mild stenosis in the diagonal.  He was found to have moderate 50% mid circumflex stenosis.  He had a patent right PDA stent with diffuse nonobstructive PDA and RCA stenosis.  His LVEDP was normal.  He was to continue dual antiplatelet therapy with aspirin and clopidogrel for a minimum of 6 months without interruption.  [name] comes today with recurrent chest discomfort.  He states that he cannot tell the difference between GERD, musculoskeletal pain, or angina.  He is taking extra doses of nitroglycerin when he feels the discomfort.  He states that the nitroglycerin temporarily helps him.  He admits to lifting heavy objects when he went on a camping trip, and also having a lot of heartburn and burping.  He  states this usually occurs after eating. He denies any severe discomfort compared to his symptoms of angina and ischemic pain.  He is medically compliant.  He has also had some complaints of lower extremity cramping.  Past Medical History:  Diagnosis Date  . Bipolar 1 disorder ([place])   . Coronary artery disease   . Diabetes mellitus   . Dyslipidemia   . Emphysema   . Mental disorder    BIPOLAR DISORDER  . Migraine   . NSTEMI (non-ST elevated myocardial infarction) ([place]) 09/20/2020  . PONV (postoperative nausea and vomiting)   . Renal disorder   . Shortness of breath   . Stroke ([place])   . Transient ischemic attack (TIA)     Past Surgical History:  Procedure Laterality Date  . CORONARY ANGIOGRAPHY  09/20/2020  . CORONARY STENT INTERVENTION  09/20/2020   CORONARY STENT INTERVENTION  . CORONARY STENT INTERVENTION N/A 09/20/2020   Procedure: CORONARY STENT INTERVENTION;  Surgeon: Belva Crome, MD;  Location: [facility];  Service: Cardiovascular;  Laterality: N/A;  . CORONARY STENT INTERVENTION N/A 03/28/2021   Procedure: CORONARY STENT INTERVENTION;  Surgeon: [name], MD;  Location: [facility];  Service: Cardiovascular;  Laterality: N/A;  . HEMORROIDECTOMY    . KNEE ARTHROSCOPY    . LEFT HEART CATH AND CORONARY ANGIOGRAPHY N/A 09/20/2020   Procedure: LEFT HEART CATH AND CORONARY ANGIOGRAPHY;  Surgeon: Belva Crome, MD;  Location: [facility];  Service: Cardiovascular;  Laterality: N/A;  . LEFT HEART CATH  AND CORONARY ANGIOGRAPHY N/A 03/28/2021   Procedure: LEFT HEART CATH AND CORONARY ANGIOGRAPHY;  Surgeon: [name], MD;  Location: [facility];  Service: Cardiovascular;  Laterality: N/A;  . LUMBAR [place]    . TONSILLECTOMY AND ADENOIDECTOMY       Current Outpatient Medications  Medication Sig Dispense Refill  . acetaminophen (TYLENOL) 500 MG tablet Take 1,000 mg by mouth every 6 (six) hours as needed for mild pain or headache.      Marland Kitchen aspirin 81 MG chewable tablet Chew 1 tablet (81 mg total) by mouth daily.    Marland Kitchen atorvastatin (LIPITOR) 80 MG tablet Take 1 tablet (80 mg total) by mouth daily. 30 tablet 11  . carvedilol (COREG) 6.25 MG tablet Take 1 tablet (6.25 mg total) by mouth 2 (two) times daily with a meal. 60 tablet 11  . clopidogrel (PLAVIX) 75 MG tablet Take 1 tablet (75 mg total) by mouth daily with breakfast. 30 tablet 11  . Continuous Blood Gluc Sensor (FREESTYLE LIBRE 2 SENSOR) MISC 1 Device by Does not apply route every 14 (fourteen) days. 6 each 3  . Dulaglutide (TRULICITY) 1.5 ZD/6.6YQ SOPN Inject 1.5 mg into the skin once a week. (Patient taking differently: Inject 3 mg into the skin once a week.) 6 mL 3  . EPINEPHrine 0.3 mg/0.3 mL IJ SOAJ injection Inject 0.3 mg into the muscle as needed for anaphylaxis. 1 each 0  . Fluticasone-Salmeterol (ADVAIR DISKUS) 250-50 MCG/DOSE AEPB Take 1 puff twice daily.  Use regularly, not a rescue inhaler. (Patient taking differently: Inhale 1 puff into the lungs daily as needed (shortness of breath).) 60 each 0  . Ibuprofen (ADVIL) 200 MG CAPS Take 400 mg by mouth every 8 (eight) hours as needed (for pain).     . insulin degludec (TRESIBA FLEXTOUCH) 100 UNIT/ML FlexTouch Pen Inject 30 Units into the skin daily. 30 mL 3  . Insulin Pen Needle 32G X 4 MM MISC Inject 20 units insulin daily 100 each 0  . metFORMIN (GLUCOPHAGE-XR) 500 MG 24 hr tablet Take 4 tablets (2,000 mg total) by mouth daily with breakfast. 360 tablet 3  . nitroGLYCERIN (NITROSTAT) 0.4 MG SL tablet Place 1 tablet (0.4 mg total) under the tongue every 5 (five) minutes x 3 doses as needed for chest pain. 25 tablet 3   No current facility-administered medications for this visit.    Allergies:   Bee venom, Penicillins, Procaine, and Isosorbide    Social History:  The patient  reports that he has quit smoking. His smoking use included e-cigarettes. He quit after 7.00 years of use. He uses smokeless tobacco. He  reports that he does not drink alcohol and does not use drugs.   Family History:  The patient's family history includes Bipolar disorder in an other family member; CAD (age of onset: 58) in his mother; Coronary artery disease in an other family member; Diabetes type II in an other family member; Lung cancer in his father.    ROS: All other systems are reviewed and negative. Unless otherwise mentioned in H&P    PHYSICAL EXAM: VS:  BP 136/88   Pulse 89   Ht 5\' 8"  (1.727 m)   Wt 238 lb 3.2 oz (108 kg)   BMI 36.22 kg/m  , BMI Body mass index is 36.22 kg/m. GEN: Well nourished, well developed, in no acute distress HEENT: normal Neck: no JVD, carotid bruits, or masses Cardiac: RRR; tachycardic, no murmurs, rubs, or gallops,no edema  Respiratory:  Clear to auscultation bilaterally, normal work of breathing GI: soft, nontender, nondistended, + BS, obese MS: no deformity or atrophy Skin: warm and dry, no rash Neuro:  Strength and sensation are intact Psych: euthymic mood, full affect   EKG: Normal sinus rhythm, rate of 89 bpm (personally reviewed)  Recent Labs: 09/20/2020: ALT 15; B Natriuretic Peptide 136.7; TSH 0.632 03/29/2021: BUN 14; Creatinine, Ser 0.59; Hemoglobin 13.1; Platelets 212; Potassium 3.6; Sodium 138    Lipid Panel    Component Value Date/Time   CHOL 103 12/13/2020 0824   TRIG 72 12/13/2020 0824   HDL 36 (L) 12/13/2020 0824   CHOLHDL 2.9 12/13/2020 0824   CHOLHDL 5.2 09/20/2020 1559   VLDL 25 09/20/2020 1559   LDLCALC 52 12/13/2020 0824      Wt Readings from Last 3 Encounters:  05/10/21 238 lb 3.2 oz (108 kg)  04/24/21 244 lb (110.7 kg)  04/19/21 244 lb 3.2 oz (110.8 kg)      Other studies Reviewed: 03/28/2021 CORONARY STENT INTERVENTION  LEFT HEART CATH AND CORONARY ANGIOGRAPHY    Conclusion  1. Patent left main 2. Severe stenosis of the proximal LAD, new from cardiac catheterization 6 months ago, treated successfully with PCI using a 3.5 x  20 mm Synergy DES. The mid LAD is unchanged from the prior study with Med[place] 0, 1, 1 bifurcation disease with only moderate stenosis in the LAD and mild stenosis in the diagonal 3. Moderate 50% mid circumflex stenosis 4. Continued patency of the right PDA stent with mild diffuse nonobstructive PDA and RCA stenosis 5. Normal LVEDP  Recommendations: Continue dual antiplatelet therapy with aspirin and clopidogrel at least 6 months without interruption. Should be eligible for hospital discharge tomorrow.    Diagnostic Dominance: Right    Intervention    NM Stress test 03/08/2021    Nuclear stress EF is calculated at 53% but visually appears to be at least 55-60%.  There was no ST segment deviation noted during stress.  There is a medium defect of moderate severity present in the basal inferoseptal, basal inferior, mid inferior and apical inferior location. The defect is non-reversible and worse on resting images. In the setting of normal LVF, this is most consistent with diaphragmatic attenuation artifact. No ischemia noted. This is a low risk study.  Echocardiogram: 09/20/2020  . Left ventricular ejection fraction, by estimation, is 55 to 60%. The  left ventricle has normal function. The left ventricle has no regional  wall motion abnormalities. There is severe left ventricular hypertrophy.  Left ventricular diastolic parameters  are consistent with Grade I diastolic dysfunction (impaired relaxation).  2. Right ventricular systolic function is normal. The right ventricular  size is normal. Tricuspid regurgitation signal is inadequate for assessing  PA pressure.  3. The mitral valve is normal in structure. No evidence of mitral valve  regurgitation. No evidence of mitral stenosis.  4. The aortic valve is tricuspid. Aortic valve regurgitation is not  visualized. No aortic stenosis is present.  5. Aortic dilatation noted. There is mild dilatation of the aortic  root,  measuring 38 mm.  6. The inferior vena cava is normal in size with greater than 50%  respiratory variability, suggesting right atrial pressure of 3 mmHg.      ASSESSMENT AND PLAN:  1.  Coronary artery disease: Status post cardiac catheterization revealing 85% proximal LAD stenosis.  He continues to have discomfort in his chest which he cannot tell the difference concerning angina pain, GERD pain, or musculoskeletal pain.  The discomfort began after lifting heavy objects when he was camping.  He does take a lot of nitroglycerin to help with pain which is relieving to him but pain comes back with and without exertion.  I believe this is more musculoskeletal in nature.  I have offered isosorbide mononitrate but he states he cannot tolerate it because it causes severe migraines.  We will continue to monitor him.  Do not think we need to do a relook cath at this time.  Continue dual antiplatelet therapy and secondary prevention.  I have answered multiple questions and reviewed his cath report and illustration.  A copy is provided to him.  2. GERD: The patient has been on PPIs in the past and H2 blockers but has stopped taking them.  He does use Tums on occasion.  I suggested that he try some H2 blockers to help with his GERD symptoms to see if this would help him and reduce his recurrent discomfort.  It is possible that he needs to be seen by a GI specialist if this continues.  He does not have a primary care physician, but does see an endocrinologist.  I have asked him to make sure he gets in touch with a PCP for ongoing noncardiac management of general health issues.  3. Musculoskeletal pain:  I think he overdid it too soon after having his stent placement, as he went camping with a friend a couple weeks later lifting significantly heavy objects.  He may have strained his pectoral muscles.  I have advised him to avoid lifting heavy objects until the pain subsides.  If this continues, he may need  to see PCP.  Would avoid NSAIDs with coronary artery disease.  4.  Hyperlipidemia: Goal of LDL is less than 70.  He will have repeat fasting lipids and LFTs next month, and follow-up with Dr. Sallyanne Kuster in July.  Continue high intensity statin therapy.  5.  Muscle cramps: I have suggested that he begin to take potassium supplement.  On review of labs his potassium was 3.6.  Patient is with coronary artery disease should have at least 4.0 potassium level.  I am hopeful that taking 10 mEq daily will be helpful to him concerning muscle cramps and possibly angina symptoms.  Not certain if this is myalgia pain from statin therapy.  Follow-up BMET is ordered with lipid study.  Current medicines are reviewed at length with the patient today.  I have spent 30 minutes  dedicated to the care of this patient on the date of this encounter to include pre-visit review of records, assessment, management and diagnostic testing,with shared decision making.  Labs/ tests ordered today include: Fasting lipids and LFTs, BMET. Phill Myron. [place] Pugh, ANP, [facility]   05/10/2021 10:05 AM    [facility] [address] Office [phone] Fax [phone]  Notice: This dictation was prepared with Dragon dictation along with smaller phrase technology. Any transcriptional errors that result from this process are unintentional and may not be corrected upon review.

## 2021-05-10 ENCOUNTER — Encounter: Payer: Self-pay | Admitting: Adult Health

## 2021-05-10 ENCOUNTER — Ambulatory Visit: Payer: BC Managed Care – PPO | Admitting: Adult Health

## 2021-05-10 ENCOUNTER — Other Ambulatory Visit: Payer: Self-pay

## 2021-05-10 VITALS — BP 136/88 | HR 89 | Ht 68.0 in | Wt 238.2 lb

## 2021-05-10 DIAGNOSIS — I10 Essential (primary) hypertension: Secondary | ICD-10-CM | POA: Diagnosis not present

## 2021-05-10 DIAGNOSIS — E1159 Type 2 diabetes mellitus with other circulatory complications: Secondary | ICD-10-CM | POA: Diagnosis not present

## 2021-05-10 DIAGNOSIS — Z794 Long term (current) use of insulin: Secondary | ICD-10-CM

## 2021-05-10 DIAGNOSIS — E7800 Pure hypercholesterolemia, unspecified: Secondary | ICD-10-CM | POA: Diagnosis not present

## 2021-05-10 DIAGNOSIS — I2510 Atherosclerotic heart disease of native coronary artery without angina pectoris: Secondary | ICD-10-CM | POA: Diagnosis not present

## 2021-05-10 DIAGNOSIS — R252 Cramp and spasm: Secondary | ICD-10-CM

## 2021-05-10 DIAGNOSIS — K219 Gastro-esophageal reflux disease without esophagitis: Secondary | ICD-10-CM

## 2021-05-10 NOTE — Patient Instructions (Signed)
Medication Instructions:  No Changes *If you need a refill on your cardiac medications before your next appointment, please call your pharmacy*   Lab Work: CMP,CBC,LFT,Lipid Panel (Prior to Visit with Dr. Loletha Grayer) If you have labs (blood work) drawn today and your tests are completely normal, you will receive your results only by: Marland Kitchen MyChart Message (if you have MyChart) OR . A paper copy in the mail If you have any lab test that is abnormal or we need to change your treatment, we will call you to review the results.   Testing/Procedures: No Testing    Follow-Up: At [facility], you and your health needs are our priority.  As part of our continuing mission to provide you with exceptional heart care, we have created designated Provider Care Teams.  These Care Teams include your primary Cardiologist (physician) and Advanced Practice Providers (APPs -  Physician Assistants and Nurse Practitioners) who all work together to provide you with the care you need, when you need it.  We recommend signing up for the patient portal called "MyChart".  Sign up information is provided on this After Visit Summary.  MyChart is used to connect with patients for Virtual Visits (Telemedicine).  Patients are able to view lab/test results, encounter notes, upcoming appointments, etc.  Non-urgent messages can be sent to your provider as well.   To learn more about what you can do with MyChart, go to [email or web address].    Your next appointment:   3 month(s)  The format for your next appointment:   In Person  Provider:   Sanda Klein, MD

## 2021-05-10 NOTE — Progress Notes (Signed)
Thank you. Not the easiest.

## 2021-06-13 ENCOUNTER — Encounter: Payer: Self-pay | Admitting: *Deleted

## 2021-06-13 DIAGNOSIS — Z006 Encounter for examination for normal comparison and control in clinical research program: Secondary | ICD-10-CM

## 2021-06-13 NOTE — Research (Addendum)
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 He reports no AEs, SAEs, Urgent care visits or Emergency room visits since we last spoke. He was going to have a colonoscopy but this was postponed due to his last cardiac stent and the need for anticoagulation. [name] states that he is taking all of his medications (ASA, Coreg, Plavix, Lipitor) as directed. He reports that there has been some adjustments made to his Trulicity and [place] dosing. Jerry Shaffer does not smoke but he does chew tobacco and he is trying to quit this asap. We discussed the effects of nicotine on his body and blood vessels. 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   We will set him up to come in to our Research office with Dr. Lia Foyer for his EOS Visit 11. Patient request first appt any day of the week.                                    "CONSENT"   YES     NO   Continuing further Investigational Product and study visits for follow-up? [x]  []   Continuing consent from future biomedical research [x]  []                                      "EVENTS"    YES     NO  AE   (IF YES SEE SOURCE) []  [x]   SAE  (IF YES SEE SOURCE) []  [x]   ENDPOINT   (IF YES SEE SOURCE) []  [x]   REVASCULARIZATION  (IF YES SEE SOURCE) []  [x]   AMPUTATION   (IF YES SEE SOURCE) []  [x]   TROPONIN'S  (IF YES SEE SOURCE) []  [x]      Lifestyle Adherence Assessment:    YES NO  Abstinence from smoking/remaining tobacco free  X  Cardiac Diet X   Routine physical activity and/or cardiac rehabilitation X

## 2021-07-12 ENCOUNTER — Other Ambulatory Visit: Payer: Self-pay

## 2021-07-12 ENCOUNTER — Ambulatory Visit: Payer: BC Managed Care – PPO | Admitting: Endocrinology

## 2021-07-12 ENCOUNTER — Other Ambulatory Visit: Payer: Self-pay | Admitting: Endocrinology

## 2021-07-12 VITALS — BP 124/70 | HR 91 | Ht 68.0 in | Wt 240.6 lb

## 2021-07-12 DIAGNOSIS — E1159 Type 2 diabetes mellitus with other circulatory complications: Secondary | ICD-10-CM

## 2021-07-12 LAB — POCT GLYCOSYLATED HEMOGLOBIN (HGB A1C): Hemoglobin A1C: 7.1 % — AB (ref 4.0–5.6)

## 2021-07-12 MED ORDER — TRESIBA FLEXTOUCH 100 UNIT/ML ~~LOC~~ SOPN: 20.0000 [IU] | PEN_INJECTOR | Freq: Every day | SUBCUTANEOUS | 3 refills | Status: DC

## 2021-07-12 MED ORDER — TRULICITY 3 MG/0.5ML ~~LOC~~ SOAJ: 3.0000 mg | SUBCUTANEOUS | 3 refills | Status: DC

## 2021-07-12 NOTE — Patient Instructions (Addendum)
check your blood sugar twice a day.  vary the time of day when you check, between before the 3 meals, and at bedtime.  also check if you have symptoms of your blood sugar being too high or too low.  please keep a record of the readings and bring it to your next appointment here (or you can bring the meter itself).  You can write it on any piece of paper.  please call us sooner if your blood sugar goes below 70, or if you have a lot of readings over 200.    I have sent 2 prescriptions to your pharmacy, to increase the Trulicity, and decrease the [place].   Please continue the same metformin.     Please come back for a follow-up appointment in 3 months.

## 2021-07-12 NOTE — Telephone Encounter (Signed)
[facility] called [facility]  [id number]  [phone] Fax:  [phone]     - and [facility].

## 2021-07-12 NOTE — Progress Notes (Signed)
Subjective:    Patient ID: Jerry Shaffer, male    DOB: 02-12-1961, 60 y.o.   MRN: [id number]  HPI Pt returns for f/u of diabetes mellitus: DM type: Insulin-requiring type 2 DM: Dx'ed: 0000000 Complications: CAD and TIA Therapy: insulin since 123XX123, Trulicity, and metformin.   DKA: never Severe hypoglycemia: never Pancreatitis: never Pancreatic imaging: normal on 2021 CT SDOH: he works [place], 3rd shift.  Other: pt says this was precip by Seroquel; he also took insulin 2016-2017; he declines multiple daily injections.  Interval history: he takes insulin as rx'ed.  pt states he feels well in general.  no cbg record, but states cbg's are in the 100's.  He takes Trulicity is 1.5 mg/week.  Past Medical History:  Diagnosis Date   Bipolar 1 disorder ([place])    Coronary artery disease    Diabetes mellitus    Dyslipidemia    Emphysema    Mental disorder    BIPOLAR DISORDER   Migraine    NSTEMI (non-ST elevated myocardial infarction) ([place]) 09/20/2020   PONV (postoperative nausea and vomiting)    Renal disorder    Shortness of breath    Stroke ([place])    Transient ischemic attack (TIA)     Past Surgical History:  Procedure Laterality Date   CORONARY ANGIOGRAPHY  09/20/2020   CORONARY STENT INTERVENTION  09/20/2020   CORONARY STENT INTERVENTION   CORONARY STENT INTERVENTION N/A 09/20/2020   Procedure: CORONARY STENT INTERVENTION;  Surgeon: Belva Crome, MD;  Location: [facility];  Service: Cardiovascular;  Laterality: N/A;   CORONARY STENT INTERVENTION N/A 03/28/2021   Procedure: CORONARY STENT INTERVENTION;  Surgeon: [name], MD;  Location: [facility];  Service: Cardiovascular;  Laterality: N/A;   HEMORROIDECTOMY     KNEE ARTHROSCOPY     LEFT HEART CATH AND CORONARY ANGIOGRAPHY N/A 09/20/2020   Procedure: LEFT HEART CATH AND CORONARY ANGIOGRAPHY;  Surgeon: Belva Crome, MD;  Location: [facility];  Service: Cardiovascular;  Laterality: N/A;   LEFT  HEART CATH AND CORONARY ANGIOGRAPHY N/A 03/28/2021   Procedure: LEFT HEART CATH AND CORONARY ANGIOGRAPHY;  Surgeon: [name], MD;  Location: [facility];  Service: Cardiovascular;  Laterality: N/A;   LUMBAR DISC SURGERY     TONSILLECTOMY AND ADENOIDECTOMY      Social History   Socioeconomic History   Marital status: Single    Spouse name: Not on file   Number of children: Not on file   Years of education: Not on file   Highest education level: Not on file  Occupational History   Occupation: Security guard    Employer: [employer]  Tobacco Use   Smoking status: Former    Types: E-cigarettes   Smokeless tobacco: Current  Vaping Use   Vaping Use: Never used  Substance and Sexual Activity   Alcohol use: No   Drug use: No   Sexual activity: Not Currently  Other Topics Concern   Not on file  Social History Narrative   Not on file   Social Determinants of Health   Financial Resource Strain: Not on file  Food Insecurity: Not on file  Transportation Needs: Not on file  Physical Activity: Not on file  Stress: Not on file  Social Connections: Not on file  Intimate Partner Violence: Not on file    Current Outpatient Medications on File Prior to Visit  Medication Sig Dispense Refill   acetaminophen (TYLENOL) 500 MG tablet Take 1,000  mg by mouth every 6 (six) hours as needed for mild pain or headache.      aspirin 81 MG chewable tablet Chew 1 tablet (81 mg total) by mouth daily.     atorvastatin (LIPITOR) 80 MG tablet Take 1 tablet (80 mg total) by mouth daily. 30 tablet 11   carvedilol (COREG) 6.25 MG tablet Take 1 tablet (6.25 mg total) by mouth 2 (two) times daily with a meal. 60 tablet 11   clopidogrel (PLAVIX) 75 MG tablet Take 1 tablet (75 mg total) by mouth daily with breakfast. 30 tablet 11   Continuous Blood Gluc Sensor (FREESTYLE LIBRE 2 SENSOR) MISC 1 Device by Does not apply route every 14 (fourteen) days. 6 each 3   EPINEPHrine 0.3 mg/0.3 mL IJ SOAJ  injection Inject 0.3 mg into the muscle as needed for anaphylaxis. 1 each 0   Fluticasone-Salmeterol (ADVAIR DISKUS) 250-50 MCG/DOSE AEPB Take 1 puff twice daily.  Use regularly, not a rescue inhaler. (Patient taking differently: Inhale 1 puff into the lungs daily as needed (shortness of breath).) 60 each 0   Ibuprofen (ADVIL) 200 MG CAPS Take 400 mg by mouth every 8 (eight) hours as needed (for pain).      metFORMIN (GLUCOPHAGE-XR) 500 MG 24 hr tablet Take 4 tablets (2,000 mg total) by mouth daily with breakfast. 360 tablet 3   nitroGLYCERIN (NITROSTAT) 0.4 MG SL tablet Place 1 tablet (0.4 mg total) under the tongue every 5 (five) minutes x 3 doses as needed for chest pain. 25 tablet 3   No current facility-administered medications on file prior to visit.    Allergies  Allergen Reactions   Bee Venom Anaphylaxis   Penicillins Anaphylaxis    Did it involve swelling of the face/tongue/throat, SOB, or low BP? yes Did it involve sudden or severe rash/hives, skin peeling, or any reaction on the inside of your mouth or nose? unknown Did you need to seek medical attention at a hospital or doctor's office? yes When did it last happen?      1967 If all above answers are "NO", may proceed with cephalosporin use.    Procaine Anaphylaxis   Isosorbide     Causes migraines.     Family History  Problem Relation Age of Onset   Diabetes type II Other    Coronary artery disease Other    Bipolar disorder Other    Lung cancer Father    CAD Mother 31    BP 124/70 (BP Location: Right Arm, Patient Position: Sitting, Cuff Size: Large)   Pulse 91   Ht '5\' 8"'$  (1.727 m)   Wt 240 lb 9.6 oz (109.1 kg)   SpO2 95%   BMI 36.58 kg/m    Review of Systems He denies hypoglycemia/n/v.  He has slight HB.      Objective:   Physical Exam Pulses: dorsalis pedis intact bilat.   MSK: no deformity of the feet CV: no leg edema.   Skin:  no ulcer on the feet.  normal color and temp on the feet. Neuro: sensation  is intact to touch on the feet.    Lab Results  Component Value Date   HGBA1C 7.1 (A) 07/12/2021       Assessment & Plan:  Insulin-requiring type 2 DM: uncontrolled. Plan is to favor GLP rx over insulin.   Patient Instructions  check your blood sugar twice a day.  vary the time of day when you check, between before the 3 meals, and at bedtime.  also check if you have symptoms of your blood sugar being too high or too low.  please keep a record of the readings and bring it to your next appointment here (or you can bring the meter itself).  You can write it on any piece of paper.  please call us sooner if your blood sugar goes below 70, or if you have a lot of readings over 200.    I have sent 2 prescriptions to your pharmacy, to increase the Trulicity, and decrease the [place].   Please continue the same metformin.     Please come back for a follow-up appointment in 3 months.

## 2021-07-16 ENCOUNTER — Other Ambulatory Visit: Payer: Self-pay

## 2021-07-16 ENCOUNTER — Telehealth: Payer: Self-pay | Admitting: Endocrinology

## 2021-07-16 DIAGNOSIS — E1159 Type 2 diabetes mellitus with other circulatory complications: Secondary | ICD-10-CM

## 2021-07-16 MED ORDER — INSULIN PEN NEEDLE 32G X 4 MM MISC: 12 refills | Status: DC

## 2021-07-16 NOTE — Telephone Encounter (Signed)
Patient needs Rx refill BD mano pen needles 55mby 32g. Patient also needs refill Atorvastatin 80 MG. [place]

## 2021-07-19 ENCOUNTER — Other Ambulatory Visit: Payer: Self-pay | Admitting: Cardiovascular Disease

## 2021-07-19 ENCOUNTER — Telehealth: Payer: Self-pay | Admitting: Cardiovascular Disease

## 2021-07-19 NOTE — Telephone Encounter (Signed)
Patient is completely out of medication.  *STAT* If patient is at the pharmacy, call can be transferred to refill team.   1. Which medications need to be refilled? (please list name of each medication and dose if known)  atorvastatin (LIPITOR) 80 MG tablet  2. Which pharmacy/location (including street and city if local pharmacy) is medication to be sent to? [id number] - [place], [place]  3. Do they need a 30 day or 90 day supply?  90 day supply

## 2021-07-20 NOTE — Telephone Encounter (Signed)
Looks like DNP [facility] refilled the Lipitor on 08/05. Rosaria Ferries, PA-C 07/20/2021 3:23 PM

## 2021-07-26 ENCOUNTER — Other Ambulatory Visit: Payer: Self-pay | Admitting: Endocrinology

## 2021-07-26 DIAGNOSIS — E1159 Type 2 diabetes mellitus with other circulatory complications: Secondary | ICD-10-CM

## 2021-08-13 ENCOUNTER — Telehealth: Payer: Self-pay | Admitting: Cardiovascular Disease

## 2021-08-13 MED ORDER — CLOPIDOGREL BISULFATE 75 MG PO TABS: 75.0000 mg | ORAL_TABLET | Freq: Every day | ORAL | 11 refills | Status: DC

## 2021-08-13 NOTE — Telephone Encounter (Signed)
*  STAT* If patient is at the pharmacy, call can be transferred to refill team.   1. Which medications need to be refilled? (please list name of each medication and dose if known) clopidogrel (PLAVIX) 75 MG tablet  2. Which pharmacy/location (including street and city if local pharmacy) is medication to be sent to? [id number] - [place], [place] - [place]  3. Do they need a 30 day or 90 day supply? 90 day   Patient has 2 days left of medication

## 2021-08-15 DIAGNOSIS — I2510 Atherosclerotic heart disease of native coronary artery without angina pectoris: Secondary | ICD-10-CM | POA: Diagnosis not present

## 2021-08-15 LAB — CBC
Hematocrit: 41.4 % (ref 37.5–51.0)
Hemoglobin: 13.5 g/dL (ref 13.0–17.7)
MCH: 28.3 pg (ref 26.6–33.0)
MCHC: 32.6 g/dL (ref 31.5–35.7)
MCV: 87 fL (ref 79–97)
Platelets: 257 10*3/uL (ref 150–450)
RBC: 4.77 x10E6/uL (ref 4.14–5.80)
RDW: 13.2 % (ref 11.6–15.4)
WBC: 9.6 10*3/uL (ref 3.4–10.8)

## 2021-08-15 LAB — COMPREHENSIVE METABOLIC PANEL
ALT: 13 IU/L (ref 0–44)
AST: 10 IU/L (ref 0–40)
Albumin/Globulin Ratio: 2.6 — ABNORMAL HIGH (ref 1.2–2.2)
Albumin: 4.5 g/dL (ref 3.8–4.9)
Alkaline Phosphatase: 119 IU/L (ref 44–121)
BUN/Creatinine Ratio: 17 (ref 9–20)
BUN: 12 mg/dL (ref 6–24)
Bilirubin Total: 0.5 mg/dL (ref 0.0–1.2)
CO2: 23 mmol/L (ref 20–29)
Calcium: 10.6 mg/dL — ABNORMAL HIGH (ref 8.7–10.2)
Chloride: 100 mmol/L (ref 96–106)
Creatinine, Ser: 0.72 mg/dL — ABNORMAL LOW (ref 0.76–1.27)
Globulin, Total: 1.7 g/dL (ref 1.5–4.5)
Glucose: 105 mg/dL — ABNORMAL HIGH (ref 65–99)
Potassium: 4.2 mmol/L (ref 3.5–5.2)
Sodium: 139 mmol/L (ref 134–144)
Total Protein: 6.2 g/dL (ref 6.0–8.5)
eGFR: 106 mL/min/{1.73_m2} (ref >59)

## 2021-08-15 LAB — LIPID PANEL
Chol/HDL Ratio: 3.3 ratio (ref 0.0–5.0)
Cholesterol, Total: 87 mg/dL — ABNORMAL LOW (ref 100–199)
HDL: 26 mg/dL — ABNORMAL LOW (ref >39)
LDL Chol Calc (NIH): 45 mg/dL (ref 0–99)
Triglycerides: 72 mg/dL (ref 0–149)
VLDL Cholesterol Cal: 16 mg/dL (ref 5–40)

## 2021-08-15 LAB — HEPATIC FUNCTION PANEL: Bilirubin, Direct: 0.15 mg/dL (ref 0.00–0.40)

## 2021-08-21 ENCOUNTER — Encounter: Payer: Self-pay | Admitting: Cardiovascular Disease

## 2021-08-21 ENCOUNTER — Other Ambulatory Visit: Payer: Self-pay

## 2021-08-21 ENCOUNTER — Ambulatory Visit: Payer: BC Managed Care – PPO | Admitting: Cardiovascular Disease

## 2021-08-21 VITALS — BP 126/78 | HR 82 | Ht 68.0 in | Wt 233.0 lb

## 2021-08-21 DIAGNOSIS — E785 Hyperlipidemia, unspecified: Secondary | ICD-10-CM

## 2021-08-21 DIAGNOSIS — Z87891 Personal history of nicotine dependence: Secondary | ICD-10-CM | POA: Diagnosis not present

## 2021-08-21 DIAGNOSIS — E1159 Type 2 diabetes mellitus with other circulatory complications: Secondary | ICD-10-CM | POA: Diagnosis not present

## 2021-08-21 DIAGNOSIS — I25118 Atherosclerotic heart disease of native coronary artery with other forms of angina pectoris: Secondary | ICD-10-CM | POA: Diagnosis not present

## 2021-08-21 DIAGNOSIS — I700 Atherosclerosis of aorta: Secondary | ICD-10-CM

## 2021-08-21 DIAGNOSIS — Z794 Long term (current) use of insulin: Secondary | ICD-10-CM

## 2021-08-21 NOTE — Patient Instructions (Signed)
Medication Instructions:  No changes *If you need a refill on your cardiac medications before your next appointment, please call your pharmacy*   Lab Work: None ordered If you have labs (blood work) drawn today and your tests are completely normal, you will receive your results only by: [place] (if you have MyChart) OR A paper copy in the mail If you have any lab test that is abnormal or we need to change your treatment, we will call you to review the results.   Testing/Procedures: None ordered   Follow-Up: At [facility], you and your health needs are our priority.  As part of our continuing mission to provide you with exceptional heart care, we have created designated Provider Care Teams.  These Care Teams include your primary Cardiologist (physician) and Advanced Practice Providers (APPs -  Physician Assistants and Nurse Practitioners) who all work together to provide you with the care you need, when you need it.  We recommend signing up for the patient portal called "MyChart".  Sign up information is provided on this After Visit Summary.  MyChart is used to connect with patients for Virtual Visits (Telemedicine).  Patients are able to view lab/test results, encounter notes, upcoming appointments, etc.  Non-urgent messages can be sent to your provider as well.   To learn more about what you can do with MyChart, go to [email or web address].    Your next appointment:   8 month(s)  The format for your next appointment:   In Person  Provider:   You may see Sanda Klein, MD or one of the following Advanced Practice Providers on your designated Care Team:   Almyra Deforest, PA-C Fabian Sharp, PA-C or  Roby Lofts, [place]

## 2021-08-22 NOTE — Progress Notes (Signed)
Cardiology Office Note:    Date:  08/22/2021   ID:  Jerry Shaffer, DOB [birth year], MRN [id number]  PCP:  Patient, No Pcp Per (Inactive)  [facility] Cardiologist:  Sanda Klein, MD  [place] Electrophysiologist:  None   Referring MD: No ref. provider found   Chief Complaint  Patient presents with   Follow-up    3 months.    History of Present Illness:    Jerry Shaffer is a 60 y.o. male with a hx of recently diagnosed coronary artery disease presenting with inferior NSTEMI on September 20, 2020 due to occlusion of the mid PDA emergency PCI-drug-eluting stent (18 x 2.75 mm Onyx), subsequently presented with unstable angina and underwent PCI-drug-eluting stent (3.5 x 20 mm Synergy) to the proximal LAD on March 28, 2021, with residual moderate stenosis in the mid LAD and mild stenosis in the diagonal artery and 50% mid circumflex stenosis;  also known to have aortic atherosclerosis,  migraine headaches, dyslipidemia (low HDL), type 2 diabetes mellitus, adult ADHD, chronic smoker.  He has had rare episodes of angina pectoris since the last procedure.  He has occasional chest discomfort, if he walks too much (3-4 miles a day, rather than his usual 1 mile a day).  The symptoms promptly resolve with rest.  He is taken nitroglycerin only a couple of times.  All in all the symptoms are mild per his report.  He denies associated dyspnea, palpitations, dizziness, syncope, diaphoresis.  He does not have intermittent claudication.  He is planning to undergo a colonoscopy in late October, at that point he will be well beyond 6 months from his last PCI-stent.  Glycemic control is substantially improved with a hemoglobin A1c that is down to 7.1%.  His A1c was even better couple months ago, but he had to retreat on his medications due to risk of hypoglycemia.  He is on a steadily decreasing dose of Tresiba in addition to Trulicity and metformin.  Labs performed just a few days ago show an LDL  cholesterol that is excellent at 45,, but also a really low HDL at 26.  Triglycerides are normal.     Past Medical History:  Diagnosis Date   Bipolar 1 disorder ([place])    Coronary artery disease    Diabetes mellitus    Dyslipidemia    Emphysema    Mental disorder    BIPOLAR DISORDER   Migraine    NSTEMI (non-ST elevated myocardial infarction) ([place]) 09/20/2020   PONV (postoperative nausea and vomiting)    Renal disorder    Shortness of breath    Stroke ([place])    Transient ischemic attack (TIA)     Past Surgical History:  Procedure Laterality Date   CORONARY ANGIOGRAPHY  09/20/2020   CORONARY STENT INTERVENTION  09/20/2020   CORONARY STENT INTERVENTION   CORONARY STENT INTERVENTION N/A 09/20/2020   Procedure: CORONARY STENT INTERVENTION;  Surgeon: Belva Crome, MD;  Location: [place] [facility];  Service: Cardiovascular;  Laterality: N/A;   CORONARY STENT INTERVENTION N/A 03/28/2021   Procedure: CORONARY STENT INTERVENTION;  Surgeon: [name], MD;  Location: [place] [facility];  Service: Cardiovascular;  Laterality: N/A;   HEMORROIDECTOMY     KNEE ARTHROSCOPY     LEFT HEART CATH AND CORONARY ANGIOGRAPHY N/A 09/20/2020   Procedure: LEFT HEART CATH AND CORONARY ANGIOGRAPHY;  Surgeon: Belva Crome, MD;  Location: [place] [facility];  Service: Cardiovascular;  Laterality: N/A;   LEFT HEART CATH AND CORONARY  ANGIOGRAPHY N/A 03/28/2021   Procedure: LEFT HEART CATH AND CORONARY ANGIOGRAPHY;  Surgeon: [name], MD;  Location: [place] [facility];  Service: Cardiovascular;  Laterality: N/A;   LUMBAR DISC SURGERY     TONSILLECTOMY AND ADENOIDECTOMY      Current Medications: Current Meds  Medication Sig   acetaminophen (TYLENOL) 500 MG tablet Take 1,000 mg by mouth every 6 (six) hours as needed for mild pain or headache.    aspirin 81 MG chewable tablet Chew 1 tablet (81 mg total) by mouth daily.   atorvastatin (LIPITOR) 80 MG tablet TAKE 1 TABLET BY MOUTH EVERY DAY   BD  PEN NEEDLE NANO 2ND GEN 32G X 4 MM MISC USE TO INJECT 20 UNITS INSULIN DAILY   carvedilol (COREG) 6.25 MG tablet Take 1 tablet (6.25 mg total) by mouth 2 (two) times daily with a meal.   clopidogrel (PLAVIX) 75 MG tablet Take 1 tablet (75 mg total) by mouth daily with breakfast.   Continuous Blood Gluc Sensor (FREESTYLE LIBRE 2 SENSOR) MISC 1 Device by Does not apply route every 14 (fourteen) days.   Dulaglutide (TRULICITY) 3 RK/2.7CW SOPN Inject 3 mg as directed once a week.   EPINEPHrine 0.3 mg/0.3 mL IJ SOAJ injection Inject 0.3 mg into the muscle as needed for anaphylaxis.   Fluticasone-Salmeterol (ADVAIR DISKUS) 250-50 MCG/DOSE AEPB Take 1 puff twice daily.  Use regularly, not a rescue inhaler. (Patient taking differently: Inhale 1 puff into the lungs daily as needed (shortness of breath).)   Ibuprofen (ADVIL) 200 MG CAPS Take 400 mg by mouth every 8 (eight) hours as needed (for pain).    insulin degludec (TRESIBA FLEXTOUCH) 100 UNIT/ML FlexTouch Pen Inject 20 Units into the skin daily. And pen needles 1/day   metFORMIN (GLUCOPHAGE-XR) 500 MG 24 hr tablet Take 4 tablets (2,000 mg total) by mouth daily with breakfast.   nitroGLYCERIN (NITROSTAT) 0.4 MG SL tablet Place 1 tablet (0.4 mg total) under the tongue every 5 (five) minutes x 3 doses as needed for chest pain.     Allergies:   Bee venom, Penicillins, Procaine, and Isosorbide   Social History   Socioeconomic History   Marital status: Single    Spouse name: Not on file   Number of children: Not on file   Years of education: Not on file   Highest education level: Not on file  Occupational History   Occupation: Security guard    Employer: [employer]  Tobacco Use   Smoking status: Former    Types: E-cigarettes   Smokeless tobacco: Current  Vaping Use   Vaping Use: Never used  Substance and Sexual Activity   Alcohol use: No   Drug use: No   Sexual activity: Not Currently  Other Topics Concern   Not on file  Social  History Narrative   Not on file   Social Determinants of Health   Financial Resource Strain: Not on file  Food Insecurity: Not on file  Transportation Needs: Not on file  Physical Activity: Not on file  Stress: Not on file  Social Connections: Not on file     Family History: The patient's family history includes Bipolar disorder in an other family member; CAD (age of onset: 29) in his mother; Coronary artery disease in an other family member; Diabetes type II in an other family member; Lung cancer in his father.  ROS:   Please see the history of present illness.     All other systems reviewed and  are negative.  EKGs/Labs/Other Studies Reviewed:    The following studies were reviewed today: ECHO 09/20/2020  1. Left ventricular ejection fraction, by estimation, is 55 to 60%. The  left ventricle has normal function. The left ventricle has no regional  wall motion abnormalities. There is severe left ventricular hypertrophy.  Left ventricular diastolic parameters   are consistent with Grade I diastolic dysfunction (impaired relaxation).   2. Right ventricular systolic function is normal. The right ventricular  size is normal. Tricuspid regurgitation signal is inadequate for assessing  PA pressure.   3. The mitral valve is normal in structure. No evidence of mitral valve  regurgitation. No evidence of mitral stenosis.   4. The aortic valve is tricuspid. Aortic valve regurgitation is not  visualized. No aortic stenosis is present.   5. Aortic dilatation noted. There is mild dilatation of the aortic root,  measuring 38 mm.   6. The inferior vena cava is normal in size with greater than 50%  respiratory variability, suggesting right atrial pressure of 3 mmHg.   Cath 09/20/2020 Total occlusion of the mid PDA (a large vessel that extends to the left ventricular apex), filling sluggishly by apical LAD to PDA collaterals.  This vessel is felt to represent the culprit for the patient's  presentation. Total occlusion of the mid PDA reduced to 0% using an 18 x 2.75 mm Onyx deployed at 12 atm x 2 inflations.  TIMI grade III flow was noted.  Resolution of chest pain occurred.  Proximal to the stented segment there is a 30 to 40% stenosis in the PDA. Left main is widely patent LAD contains mid vessel disease with a Medina 011 bifurcation stenosis.  LAD is 70% and diagonal is 40%.  This can be managed medically. Circumflex contains segmental 40 to 50% stenosis in the large second obtuse marginal. RCA is dominant, tortuous, and no significant disease other than that noted above in the PDA. Mid inferior wall to apical akinesis.  LVEDP 6 mmHg.  EF 45 to 50%.     RECOMMENDATIONS:   Aspirin and Plavix for 1 year. IV nitroglycerin discontinued. Check hemoglobin A1c. Aggressive statin therapy for risk reduction. Smoking cessation Potential discharge in 24 to 36 hours depending upon clinical course.  Cardiac catheterization 03/28/2021.  1.  Patent left main 2.  Severe stenosis of the proximal LAD, new from cardiac catheterization 6 months ago, treated successfully with PCI using a 3.5 x 20 mm Synergy DES.  The mid LAD is unchanged from the prior study with Medina 0, 1, 1 bifurcation disease with only moderate stenosis in the LAD and mild stenosis in the diagonal 3.  Moderate 50% mid circumflex stenosis 4.  Continued patency of the right PDA stent with mild diffuse nonobstructive PDA and RCA stenosis 5.  Normal LVEDP   Recommendations: Continue dual antiplatelet therapy with aspirin and clopidogrel at least 6 months without interruption.  Should be eligible for hospital discharge tomorrow.    Dominance: Right   Coronary Diagrams   Diagnostic Dominance: Right    Intervention     Implants     Permanent Stent   Synergy Xd 3.50x20 - [id number] - Implanted     EKG: EKG is not ordered today.  Tracing from 05/10/2021 shows sinus rhythm and is a completely normal  tracing.  Recent Labs: 09/20/2020: B Natriuretic Peptide 136.7; TSH 0.632 08/15/2021: ALT 13; BUN 12; Creatinine, Ser 0.72; Hemoglobin 13.5; Platelets 257; Potassium 4.2; Sodium 139  Recent Lipid Panel  Component Value Date/Time   CHOL 87 (L) 08/15/2021 0839   TRIG 72 08/15/2021 0839   HDL 26 (L) 08/15/2021 0839   CHOLHDL 3.3 08/15/2021 0839   CHOLHDL 5.2 09/20/2020 1559   VLDL 25 09/20/2020 1559   LDLCALC 45 08/15/2021 0839     Risk Assessment/Calculations:       Physical Exam:    VS:  BP 126/78 (BP Location: Left Arm, Patient Position: Sitting, Cuff Size: Large)   Pulse 82   Ht $R'5\' 8"'MQ$  (1.727 m)   Wt 233 lb (105.7 kg)   BMI 35.43 kg/m     Wt Readings from Last 3 Encounters:  08/21/21 233 lb (105.7 kg)  07/12/21 240 lb 9.6 oz (109.1 kg)  05/10/21 238 lb 3.2 oz (108 kg)      General: Alert, oriented x3, no distress, obese Head: no evidence of trauma, PERRL, EOMI, no exophtalmos or lid lag, no myxedema, no xanthelasma; normal ears, nose and oropharynx Neck: normal jugular venous pulsations and no hepatojugular reflux; brisk carotid pulses without delay and no carotid bruits Chest: clear to auscultation, no signs of consolidation by percussion or palpation, normal fremitus, symmetrical and full respiratory excursions Cardiovascular: normal position and quality of the apical impulse, regular rhythm, normal first and second heart sounds, no murmurs, rubs or gallops Abdomen: no tenderness or distention, no masses by palpation, no abnormal pulsatility or arterial bruits, normal bowel sounds, no hepatosplenomegaly Extremities: no clubbing, cyanosis or edema; 2+ radial, ulnar and brachial pulses bilaterally; 2+ right femoral, posterior tibial and dorsalis pedis pulses; 2+ left femoral, posterior tibial and dorsalis pedis pulses; no subclavian or femoral bruits Neurological: grossly nonfocal Psych: Normal mood and affect     ASSESSMENT:    1. Coronary artery disease of  native artery of native heart with stable angina pectoris ([place])   2. Dyslipidemia, goal LDL below 70   3. History of cigarette smoking   4. Type 2 diabetes mellitus with other circulatory complication, with long-term current use of insulin ([place])   5. Atherosclerosis of aorta ([place])   6. Severe obesity (BMI 35.0-39.9) with comorbidity ([place])     PLAN:    In order of problems listed above:  CAD: Had non-STEMI and underwent PCI-DES to PDA in October 2021 with subsequent unstable angina for which she underwent placement of a DES to the proximal LAD, with moderate residual disease in the left circumflex, mid LAD and first diagonal.  Currently has CCS functional class I-2 exertional angina.  Offered to add long-acting nitrates but he reported migraines with this and does not want to change his medicines at all.  He is satisfied with his current symptoms.  Okay to temporarily interrupt antiplatelet therapy for his colonoscopy in October, but ideally would like him to continue aspirin and clopidogrel through April 2023. HLP: Very low HDL cholesterol, despite excellent LDL on current medical management. Smoking: Congratulated him on staying quit.  Currently vaping.  Ideally he will quit this habit as well. DM: Steadily improving glycemic control.  No longer an SGLT2 inhibitor, but is on increasing titration of Trulicity, steadily trying to come off insulin products. Aortic atherosclerosis: Noted incidentally on imaging studies.  Normal caliber aorta on studies available so far. Obesity: Encourage continued attempts at losing weight to help with insulin resistance and dyslipidemia and improve overall prognosis.  Shared Decision Making/Informed Consent      This procedure has been fully reviewed with the patient and written informed consent has been obtained.  Medication Adjustments/Labs and Tests Ordered: Current medicines are reviewed at length with the patient today.  Concerns regarding medicines  are outlined above.  No orders of the defined types were placed in this encounter.  No orders of the defined types were placed in this encounter.   Patient Instructions  Medication Instructions:  No changes *If you need a refill on your cardiac medications before your next appointment, please call your pharmacy*   Lab Work: None ordered If you have labs (blood work) drawn today and your tests are completely normal, you will receive your results only by: [place] (if you have MyChart) OR A paper copy in the mail If you have any lab test that is abnormal or we need to change your treatment, we will call you to review the results.   Testing/Procedures: None ordered   Follow-Up: At [facility], you and your health needs are our priority.  As part of our continuing mission to provide you with exceptional heart care, we have created designated Provider Care Teams.  These Care Teams include your primary Cardiologist (physician) and Advanced Practice Providers (APPs -  Physician Assistants and Nurse Practitioners) who all work together to provide you with the care you need, when you need it.  We recommend signing up for the patient portal called "MyChart".  Sign up information is provided on this After Visit Summary.  MyChart is used to connect with patients for Virtual Visits (Telemedicine).  Patients are able to view lab/test results, encounter notes, upcoming appointments, etc.  Non-urgent messages can be sent to your provider as well.   To learn more about what you can do with MyChart, go to [email or web address].    Your next appointment:   8 month(s)  The format for your next appointment:   In Person  Provider:   You may see Sanda Klein, MD or one of the following Advanced Practice Providers on your designated Care Team:   Almyra Deforest, PA-C Fabian Sharp, [place] or  Roby Lofts, PA-C    Signed, Sanda Klein, MD  08/22/2021 12:33 PM    [place]

## 2021-08-27 ENCOUNTER — Encounter: Payer: Self-pay | Admitting: Gastroenterology

## 2021-08-27 ENCOUNTER — Telehealth: Payer: Self-pay

## 2021-08-27 ENCOUNTER — Ambulatory Visit (INDEPENDENT_AMBULATORY_CARE_PROVIDER_SITE_OTHER): Payer: BC Managed Care – PPO | Admitting: Gastroenterology

## 2021-08-27 VITALS — BP 116/78 | HR 94 | Ht 68.0 in

## 2021-08-27 DIAGNOSIS — Z1211 Encounter for screening for malignant neoplasm of colon: Secondary | ICD-10-CM

## 2021-08-27 DIAGNOSIS — K5900 Constipation, unspecified: Secondary | ICD-10-CM | POA: Diagnosis not present

## 2021-08-27 DIAGNOSIS — Z86010 Personal history of colonic polyps: Secondary | ICD-10-CM | POA: Diagnosis not present

## 2021-08-27 NOTE — Telephone Encounter (Signed)
[facility] Pre-operative Risk Assessment     Request for surgical clearance:     Endoscopy Procedure  What type of surgery is being performed?     Coloncoscopy  When is this surgery scheduled?     10-17-2021  What type of clearance is required ?   Pharmacy  Are there any medications that need to be held prior to surgery and how long? Plavix x 5 days  Practice name and name of physician performing surgery?      [facility]  What is your office phone and fax number?      Phone- [phone]  [phone]  Anesthesia type (None, local, MAC, general) ?       MAC

## 2021-08-27 NOTE — Patient Instructions (Signed)
If you are age 59 or younger, your body mass index should be between 19-25. Your Body mass index is 35.43 kg/m. If this is out of the aformentioned range listed, please consider follow up with your Primary Care Provider.  __________________________________________________________  The [facility] providers would like to encourage you to use [facility] to communicate with providers for non-urgent requests or questions.  Due to long hold times on the telephone, sending your provider a message by [facility] may be a faster and more efficient way to get a response.  Please allow 48 business hours for a response.  Please remember that this is for non-urgent requests.   You have been scheduled for a colonoscopy. Please follow written instructions given to you at your visit today.  Please pick up your prep supplies at the pharmacy within the next 1-3 days. If you use inhalers (even only as needed), please bring them with you on the day of your procedure.  Due to recent changes in healthcare laws, you may see the results of your imaging and laboratory studies on MyChart before your provider has had a chance to review them.  We understand that in some cases there may be results that are confusing or concerning to you. Not all laboratory results come back in the same time frame and the provider may be waiting for multiple results in order to interpret others.  Please give [place] 48 hours in order for your provider to thoroughly review all the results before contacting the office for clarification of your results.    Thank you for entrusting me with your care and choosing [facility].  Dr Ardis Hughs

## 2021-08-27 NOTE — Telephone Encounter (Signed)
I have faxed Dr. Victorino December note on 08/21/21 containing clearance and plavix hold.

## 2021-08-27 NOTE — Progress Notes (Signed)
Review of pertinent gastrointestinal problems: 1.  Routine risk for colon cancer . colonoscopy September 2011 indication left-sided abdominal pain.  Mild left-sided diverticulosis was noted.  A single subcentimeter polyp was removed and this was not precancerous.  He was recommended to have repeat colon cancer screening at 10-year interval 2.  Chronic alternating constipation diarrhea.  Since he was about 60 years old   HPI: This is a 60 year old man  I last saw him in May of this year.  We discussed colon cancer screening with colonoscopy however he had undergone recent coronary angiogram with drug-eluting stent placement and a coronary artery and we decided it was best to wait on that screening test.  He recently saw his cardiologist.  He has stable exertional angina.  She offered long-acting nitrates but he declined.  She did document to her note that it would be safe to temporarily hold his Plavix for a colonoscopy.  Blood work September 2022 shows normal CBC.  He is here again to discuss routine risk colon cancer screening.  He made it quite clear that he was unhappy that he had to pay $40 at his last visit to be told that we should not be doing his colonoscopy given his recent drug-eluting stent placement.  He also used profanity during the visit today telling me that we would need to "knock him the F... out" during the colonoscopy because he did not want to feel a thing.  He still has alternating constipation and loose stools, he blames this on some GI testing that he had when he was a teenager.    ROS: complete GI ROS as described in HPI, all other review negative.  Constitutional:  No unintentional weight loss   Past Medical History:  Diagnosis Date   Bipolar 1 disorder ([place])    Coronary artery disease    Diabetes mellitus    Dyslipidemia    Emphysema    Mental disorder    BIPOLAR DISORDER   Migraine    NSTEMI (non-ST elevated myocardial infarction) ([place]) 09/20/2020    PONV (postoperative nausea and vomiting)    Renal disorder    Shortness of breath    Stroke ([place])    Transient ischemic attack (TIA)     Past Surgical History:  Procedure Laterality Date   CORONARY ANGIOGRAPHY  09/20/2020   CORONARY STENT INTERVENTION  09/20/2020   CORONARY STENT INTERVENTION   CORONARY STENT INTERVENTION N/A 09/20/2020   Procedure: CORONARY STENT INTERVENTION;  Surgeon: Belva Crome, MD;  Location: [facility] [facility];  Service: Cardiovascular;  Laterality: N/A;   CORONARY STENT INTERVENTION N/A 03/28/2021   Procedure: CORONARY STENT INTERVENTION;  Surgeon: [name], MD;  Location: [facility];  Service: Cardiovascular;  Laterality: N/A;   HEMORROIDECTOMY     KNEE ARTHROSCOPY     LEFT HEART CATH AND CORONARY ANGIOGRAPHY N/A 09/20/2020   Procedure: LEFT HEART CATH AND CORONARY ANGIOGRAPHY;  Surgeon: Belva Crome, MD;  Location: [facility];  Service: Cardiovascular;  Laterality: N/A;   LEFT HEART CATH AND CORONARY ANGIOGRAPHY N/A 03/28/2021   Procedure: LEFT HEART CATH AND CORONARY ANGIOGRAPHY;  Surgeon: [name], MD;  Location: [facility];  Service: Cardiovascular;  Laterality: N/A;   LUMBAR DISC SURGERY     TONSILLECTOMY AND ADENOIDECTOMY      Current Outpatient Medications  Medication Sig Dispense Refill   acetaminophen (TYLENOL) 500 MG tablet Take 1,000 mg by mouth every 6 (six) hours as needed for mild  pain or headache.      aspirin 81 MG chewable tablet Chew 1 tablet (81 mg total) by mouth daily.     atorvastatin (LIPITOR) 80 MG tablet TAKE 1 TABLET BY MOUTH EVERY DAY 30 tablet 11   BD PEN NEEDLE NANO 2ND GEN 32G X 4 MM MISC USE TO INJECT 20 UNITS INSULIN DAILY 200 each 3   carvedilol (COREG) 6.25 MG tablet Take 1 tablet (6.25 mg total) by mouth 2 (two) times daily with a meal. 60 tablet 11   clopidogrel (PLAVIX) 75 MG tablet Take 1 tablet (75 mg total) by mouth daily with breakfast. 30 tablet 11   Continuous Blood Gluc  Sensor (FREESTYLE LIBRE 2 SENSOR) MISC 1 Device by Does not apply route every 14 (fourteen) days. 6 each 3   Dulaglutide (TRULICITY) 3 0000000 SOPN Inject 3 mg as directed once a week. 6 mL 3   EPINEPHrine 0.3 mg/0.3 mL IJ SOAJ injection Inject 0.3 mg into the muscle as needed for anaphylaxis. 1 each 0   Fluticasone-Salmeterol (ADVAIR DISKUS) 250-50 MCG/DOSE AEPB Take 1 puff twice daily.  Use regularly, not a rescue inhaler. (Patient taking differently: Inhale 1 puff into the lungs daily as needed (shortness of breath).) 60 each 0   Ibuprofen (ADVIL) 200 MG CAPS Take 400 mg by mouth every 8 (eight) hours as needed (for pain).      insulin degludec (TRESIBA FLEXTOUCH) 100 UNIT/ML FlexTouch Pen Inject 20 Units into the skin daily. And pen needles 1/day 30 mL 3   metFORMIN (GLUCOPHAGE-XR) 500 MG 24 hr tablet Take 4 tablets (2,000 mg total) by mouth daily with breakfast. 360 tablet 3   nitroGLYCERIN (NITROSTAT) 0.4 MG SL tablet Place 1 tablet (0.4 mg total) under the tongue every 5 (five) minutes x 3 doses as needed for chest pain. 25 tablet 3   No current facility-administered medications for this visit.    Allergies as of 08/27/2021 - Review Complete 08/27/2021  Allergen Reaction Noted   Bee venom Anaphylaxis 12/15/2012   Penicillins Anaphylaxis 07/25/2010   Procaine Anaphylaxis 12/15/2012   Isosorbide  05/10/2021    Family History  Problem Relation Age of Onset   Diabetes type II Other    Coronary artery disease Other    Bipolar disorder Other    Lung cancer Father    CAD Mother 94    Social History   Socioeconomic History   Marital status: Single    Spouse name: Not on file   Number of children: Not on file   Years of education: Not on file   Highest education level: Not on file  Occupational History   Occupation: Security guard    Employer: [employer]  Tobacco Use   Smoking status: Former    Types: E-cigarettes   Smokeless tobacco: Current  Vaping Use   Vaping  Use: Never used  Substance and Sexual Activity   Alcohol use: No   Drug use: No   Sexual activity: Not Currently  Other Topics Concern   Not on file  Social History Narrative   Not on file   Social Determinants of Health   Financial Resource Strain: Not on file  Food Insecurity: Not on file  Transportation Needs: Not on file  Physical Activity: Not on file  Stress: Not on file  Social Connections: Not on file  Intimate Partner Violence: Not on file     Physical Exam: BP 116/78   Pulse 94   Ht '5\' 8"'$  (1.727 m)  BMI 35.43 kg/m  Constitutional: generally well-appearing Psychiatric: alert and oriented x3 Abdomen: soft, nontender, nondistended, no obvious ascites, no peritoneal signs, normal bowel sounds No peripheral edema noted in lower extremities  Assessment and plan: 60 y.o. male with routine risk for colon cancer, chronic alternating constipation and loose stools  He is at elevated risk for procedure related complications given his ongoing blood thinner use as well as his chronic angina.  His cardiologist documented that it would be safe for him to temporarily hold his Plavix, we will reach out to them again just to make sure however.  I would like him to hold his Plavix for 5 days prior to colonoscopy, he knows that he can continue his aspirin in the meantime.  This would be safest to be done in the hospital setting given his chronic stable angina.  We will be looking for appointment in November sometime to see if his schedule best.  I see no reason for any further blood tests or imaging studies prior to then.  Please see the "Patient Instructions" section for addition details about the plan.  Owens Loffler, MD [facility] 08/27/2021, 8:30 AM   Total time on date of encounter was 35 minutes (this included time spent preparing to see the patient reviewing records; obtaining and/or reviewing separately obtained history; performing a medically appropriate  exam and/or evaluation; counseling and educating the patient and family if present; ordering medications, tests or procedures if applicable; and documenting clinical information in the health record).

## 2021-08-28 NOTE — Telephone Encounter (Signed)
Left message for patient to return call regarding Plavix hold prior to colonoscopy. Will continue efforts.

## 2021-08-30 NOTE — Telephone Encounter (Signed)
Returning your call. Saw your calls earlier but he works third shift so he keeps phone on silence in the morning. Please call him back at [phone].

## 2021-08-30 NOTE — Telephone Encounter (Signed)
Left message for patient to return call regarding Plavix hold prior to colonoscopy. Will continue efforts.

## 2021-08-30 NOTE — Telephone Encounter (Signed)
Patient advised that he has been given clearance to hold Plavix  5 days prior to colonoscopy scheduled for 10-17-2021.  Patient advised to take last dose of Plavix on 10-11-2021, and he will be advised when to restart Plavix by Dr Ardis Hughs after the procedure.  Patient agreed to plan and verbalized understanding.  No further questions.

## 2021-10-03 ENCOUNTER — Encounter: Payer: Self-pay | Admitting: *Deleted

## 2021-10-03 DIAGNOSIS — Z006 Encounter for examination for normal comparison and control in clinical research program: Secondary | ICD-10-CM

## 2021-10-03 NOTE — Research (Addendum)
[facility] Visit 11/EOS  Patient stated that he has been doing well. He did c/o spitting up some blood in his mucus, instructed patient to talk to cardiology about this.  Patient is having colonoscopy in the beginning of November.  Patient stated that he is still working night shifts and likes working that shift.     Most recent weight: 229.8 lb                                   "CONSENT"   YES     NO   Continuing further Investigational Product and study visits for follow-up? [x]  []   Continuing consent from future biomedical research [x]  []                                      "EVENTS"    YES     NO  AE   (IF YES SEE SOURCE) []  [x]   SAE  (IF YES SEE SOURCE) []  [x]   ENDPOINT   (IF YES SEE SOURCE) []  [x]   REVASCULARIZATION  (IF YES SEE SOURCE) []  [x]   AMPUTATION   (IF YES SEE SOURCE) []  [x]   TROPONIN'S  (IF YES SEE SOURCE) []  [x]      Lifestyle Adherence Assessment:    YES NO  Abstinence from smoking/remaining tobacco free  x  Cardiac Diet x   Routine physical activity and/or cardiac rehabilitation x      Outpatient Encounter Medications as of 10/03/2021  Medication Sig Note   acetaminophen (TYLENOL) 500 MG tablet Take 1,000 mg by mouth every 6 (six) hours as needed for mild pain or headache.     aspirin 81 MG chewable tablet Chew 1 tablet (81 mg total) by mouth daily.    atorvastatin (LIPITOR) 80 MG tablet TAKE 1 TABLET BY MOUTH EVERY DAY    BD PEN NEEDLE NANO 2ND GEN 32G X 4 MM MISC USE TO INJECT 20 UNITS INSULIN DAILY    carvedilol (COREG) 6.25 MG tablet Take 1 tablet (6.25 mg total) by mouth 2 (two) times daily with a meal.    clopidogrel (PLAVIX) 75 MG tablet Take 1 tablet (75 mg total) by mouth daily with breakfast.    Continuous Blood Gluc Sensor (FREESTYLE LIBRE 2 SENSOR) MISC 1 Device by Does not apply route every 14 (fourteen) days.    Dulaglutide (TRULICITY) 3 KV/4.2VZ SOPN Inject 3 mg as directed once a week.    EPINEPHrine 0.3 mg/0.3 mL IJ SOAJ injection Inject 0.3 mg  into the muscle as needed for anaphylaxis.    Fluticasone-Salmeterol (ADVAIR DISKUS) 250-50 MCG/DOSE AEPB Take 1 puff twice daily.  Use regularly, not a rescue inhaler. (Patient taking differently: Inhale 1 puff into the lungs daily as needed (shortness of breath).) 11/19/2020: Pt only take as needed for SOB   Ibuprofen (ADVIL) 200 MG CAPS Take 400 mg by mouth every 8 (eight) hours as needed (for pain).     insulin degludec (TRESIBA FLEXTOUCH) 100 UNIT/ML FlexTouch Pen Inject 20 Units into the skin daily. And pen needles 1/day    metFORMIN (GLUCOPHAGE-XR) 500 MG 24 hr tablet Take 4 tablets (2,000 mg total) by mouth daily with breakfast.    nitroGLYCERIN (NITROSTAT) 0.4 MG SL tablet Place 1 tablet (0.4 mg total) under the tongue every 5 (five) minutes x 3 doses as needed for chest pain.  No facility-administered encounter medications on file as of 10/03/2021.

## 2021-10-07 ENCOUNTER — Encounter (HOSPITAL_COMMUNITY): Payer: Self-pay | Admitting: Gastroenterology

## 2021-10-08 NOTE — Progress Notes (Signed)
Attempted to obtain medical history via telephone, unable to reach at this time. I left a voicemail to return pre surgical testing department's phone call.  

## 2021-10-11 ENCOUNTER — Ambulatory Visit: Payer: BC Managed Care – PPO | Admitting: Endocrinology

## 2021-10-11 ENCOUNTER — Other Ambulatory Visit: Payer: Self-pay

## 2021-10-11 VITALS — BP 140/82 | HR 96 | Ht 68.0 in | Wt 234.4 lb

## 2021-10-11 DIAGNOSIS — E1159 Type 2 diabetes mellitus with other circulatory complications: Secondary | ICD-10-CM

## 2021-10-11 LAB — POCT GLYCOSYLATED HEMOGLOBIN (HGB A1C): Hemoglobin A1C: 7.3 % — AB (ref 4.0–5.6)

## 2021-10-11 MED ORDER — TRESIBA FLEXTOUCH 100 UNIT/ML ~~LOC~~ SOPN: 20.0000 [IU] | PEN_INJECTOR | Freq: Every day | SUBCUTANEOUS | 3 refills | Status: DC

## 2021-10-11 MED ORDER — TRULICITY 4.5 MG/0.5ML ~~LOC~~ SOAJ: 4.5000 mg | SUBCUTANEOUS | 3 refills | Status: DC

## 2021-10-11 MED ORDER — TRULICITY 3 MG/0.5ML ~~LOC~~ SOAJ: 3.0000 mg | SUBCUTANEOUS | 3 refills | Status: DC

## 2021-10-11 MED ORDER — METFORMIN HCL ER 500 MG PO TB24: 2000.0000 mg | ORAL_TABLET | Freq: Every day | ORAL | 3 refills | Status: DC

## 2021-10-11 NOTE — Progress Notes (Signed)
Subjective:    Patient ID: Jerry Shaffer, male    DOB: 1961/12/06, 60 y.o.   MRN: [id number]  HPI Pt returns for f/u of diabetes mellitus: DM type: Insulin-requiring type 2 DM: Dx'ed: 2130 Complications: CAD, PN, and TIA Therapy: insulin since 8657, Trulicity, and metformin.   DKA: never Severe hypoglycemia: never Pancreatitis: never Pancreatic imaging: normal on 2021 CT SDOH: he works [employer], 3rd shift.  Other: pt says this was precip by Seroquel; he also took insulin 2016-2017; he declines multiple daily injections; he stopped [place], out of fear of side effects. Interval history: he takes insulin as rx'ed.  pt states he feels well in general.  no cbg record, but states cbg's vary from 86-210.   Past Medical History:  Diagnosis Date   Bipolar 1 disorder ([place])    Coronary artery disease    Diabetes mellitus    Dyslipidemia    Emphysema    Mental disorder    BIPOLAR DISORDER   Migraine    NSTEMI (non-ST elevated myocardial infarction) ([place]) 09/20/2020   PONV (postoperative nausea and vomiting)    Renal disorder    Shortness of breath    Stroke ([place])    Transient ischemic attack (TIA)     Past Surgical History:  Procedure Laterality Date   CORONARY ANGIOGRAPHY  09/20/2020   CORONARY STENT INTERVENTION  09/20/2020   CORONARY STENT INTERVENTION   CORONARY STENT INTERVENTION N/A 09/20/2020   Procedure: CORONARY STENT INTERVENTION;  Surgeon: Belva Crome, MD;  Location: [facility] [facility];  Service: Cardiovascular;  Laterality: N/A;   CORONARY STENT INTERVENTION N/A 03/28/2021   Procedure: CORONARY STENT INTERVENTION;  Surgeon: [name], MD;  Location: [facility] [place] [facility];  Service: Cardiovascular;  Laterality: N/A;   HEMORROIDECTOMY     KNEE ARTHROSCOPY     LEFT HEART CATH AND CORONARY ANGIOGRAPHY N/A 09/20/2020   Procedure: LEFT HEART CATH AND CORONARY ANGIOGRAPHY;  Surgeon: Belva Crome, MD;  Location: [facility];  Service: Cardiovascular;   Laterality: N/A;   LEFT HEART CATH AND CORONARY ANGIOGRAPHY N/A 03/28/2021   Procedure: LEFT HEART CATH AND CORONARY ANGIOGRAPHY;  Surgeon: [name], MD;  Location: [facility];  Service: Cardiovascular;  Laterality: N/A;   LUMBAR DISC SURGERY     TONSILLECTOMY AND ADENOIDECTOMY      Social History   Socioeconomic History   Marital status: Single    Spouse name: Not on file   Number of children: Not on file   Years of education: Not on file   Highest education level: Not on file  Occupational History   Occupation: Security guard    Employer: [employer]  Tobacco Use   Smoking status: Former    Types: E-cigarettes   Smokeless tobacco: Current  Vaping Use   Vaping Use: Never used  Substance and Sexual Activity   Alcohol use: No   Drug use: No   Sexual activity: Not Currently  Other Topics Concern   Not on file  Social History Narrative   Not on file   Social Determinants of Health   Financial Resource Strain: Not on file  Food Insecurity: Not on file  Transportation Needs: Not on file  Physical Activity: Not on file  Stress: Not on file  Social Connections: Not on file  Intimate Partner Violence: Not on file    Current Outpatient Medications on File Prior to Visit  Medication Sig Dispense Refill   acetaminophen (TYLENOL) 500 MG tablet  Take 1,000 mg by mouth every 6 (six) hours as needed for mild pain or headache.      aspirin 81 MG chewable tablet Chew 1 tablet (81 mg total) by mouth daily.     atorvastatin (LIPITOR) 80 MG tablet TAKE 1 TABLET BY MOUTH EVERY DAY 30 tablet 11   BD PEN NEEDLE NANO 2ND GEN 32G X 4 MM MISC USE TO INJECT 20 UNITS INSULIN DAILY 200 each 3   carvedilol (COREG) 6.25 MG tablet Take 1 tablet (6.25 mg total) by mouth 2 (two) times daily with a meal. 60 tablet 11   clopidogrel (PLAVIX) 75 MG tablet Take 1 tablet (75 mg total) by mouth daily with breakfast. 30 tablet 11   Continuous Blood Gluc Sensor (FREESTYLE LIBRE 2 SENSOR)  MISC 1 Device by Does not apply route every 14 (fourteen) days. 6 each 3   EPINEPHrine 0.3 mg/0.3 mL IJ SOAJ injection Inject 0.3 mg into the muscle as needed for anaphylaxis. 1 each 0   Fluticasone-Salmeterol (ADVAIR DISKUS) 250-50 MCG/DOSE AEPB Take 1 puff twice daily.  Use regularly, not a rescue inhaler. (Patient taking differently: Inhale 1 puff into the lungs daily as needed (shortness of breath).) 60 each 0   Ibuprofen (ADVIL) 200 MG CAPS Take 400 mg by mouth every 8 (eight) hours as needed (for pain).      nitroGLYCERIN (NITROSTAT) 0.4 MG SL tablet Place 1 tablet (0.4 mg total) under the tongue every 5 (five) minutes x 3 doses as needed for chest pain. 25 tablet 3   Aspirin-Salicylamide-Caffeine (BC HEADACHE POWDER PO) Take 1 packet by mouth daily as needed (pain).     lidocaine (LIDODERM) 5 % Place 1 patch onto the skin daily as needed (pain). Remove & Discard patch within 12 hours or as directed by MD     No current facility-administered medications on file prior to visit.    Allergies  Allergen Reactions   Bee Venom Anaphylaxis   Penicillins Anaphylaxis    Did it involve swelling of the face/tongue/throat, SOB, or low BP? yes Did it involve sudden or severe rash/hives, skin peeling, or any reaction on the inside of your mouth or nose? unknown Did you need to seek medical attention at a hospital or doctor's office? yes When did it last happen?      1967 If all above answers are "NO", may proceed with cephalosporin use.    Procaine Anaphylaxis   Isosorbide     Causes migraines.     Family History  Problem Relation Age of Onset   Diabetes type II Other    Coronary artery disease Other    Bipolar disorder Other    Lung cancer Father    CAD Mother 43    BP 140/82 (BP Location: Right Arm, Patient Position: Sitting, Cuff Size: Large)   Pulse 96   Ht 5\' 8"  (1.727 m)   Wt 234 lb 6.4 oz (106.3 kg)   SpO2 98%   BMI 35.64 kg/m    Review of Systems He denies hypoglycemia.  He has HB     Objective:   Physical Exam Pulses: dorsalis pedis intact bilat.   MSK: no deformity of the feet CV: 1+ bilat leg edema Skin:  no ulcer on the feet.  normal color and temp on the feet. Neuro: sensation is intact to touch on the feet, but decreased from normal Ext: there is bilateral onychomycosis of the toenails.  Lab Results  Component Value Date   CREATININE 0.72 (L)  08/15/2021   BUN 12 08/15/2021   NA 139 08/15/2021   K 4.2 08/15/2021   CL 100 08/15/2021   CO2 23 08/15/2021    A1c=7.3%    Assessment & Plan:  Insulin-requiring type 2 DM.   HB, due to Trulicity.  We discussed.  We can't increase.    Patient Instructions  check your blood sugar twice a day.  vary the time of day when you check, between before the 3 meals, and at bedtime.  also check if you have symptoms of your blood sugar being too high or too low.  please keep a record of the readings and bring it to your next appointment here (or you can bring the meter itself).  You can write it on any piece of paper.  please call us sooner if your blood sugar goes below 70, or if you have a lot of readings over 200.    Please continue the same 3 diabetes meds.   Please come back for a follow-up appointment in 3 months.

## 2021-10-11 NOTE — Patient Instructions (Addendum)
check your blood sugar twice a day.  vary the time of day when you check, between before the 3 meals, and at bedtime.  also check if you have symptoms of your blood sugar being too high or too low.  please keep a record of the readings and bring it to your next appointment here (or you can bring the meter itself).  You can write it on any piece of paper.  please call us sooner if your blood sugar goes below 70, or if you have a lot of readings over 200.    Please continue the same 3 diabetes meds.   Please come back for a follow-up appointment in 3 months.

## 2021-10-16 NOTE — Anesthesia Preprocedure Evaluation (Addendum)
Anesthesia Evaluation  Patient identified by MRN, date of birth, ID band Patient awake    Reviewed: Allergy & Precautions, H&P , NPO status , Patient's Chart, lab work & pertinent test results, reviewed documented beta blocker date and time   History of Anesthesia Complications (+) PONV  Airway Mallampati: III  TM Distance: >3 FB Neck ROM: Full    Dental no notable dental hx. (+) Teeth Intact, Dental Advisory Given   Pulmonary COPD, former smoker,    Pulmonary exam normal breath sounds clear to auscultation       Cardiovascular Exercise Tolerance: Good hypertension, Pt. on medications and Pt. on home beta blockers + CAD, + Past MI and + Cardiac Stents   Rhythm:Regular Rate:Normal     Neuro/Psych  Headaches, Bipolar Disorder CVA, No Residual Symptoms    GI/Hepatic Neg liver ROS, GERD  Medicated,  Endo/Other  diabetes, Type 2, Oral Hypoglycemic Agents, Insulin DependentMorbid obesity  Renal/GU negative Renal ROS  negative genitourinary   Musculoskeletal   Abdominal   Peds  Hematology negative hematology ROS (+)   Anesthesia Other Findings   Reproductive/Obstetrics negative OB ROS                            Anesthesia Physical Anesthesia Plan  ASA: 3  Anesthesia Plan: MAC   Post-op Pain Management:    Induction: Intravenous  PONV Risk Score and Plan: 2 and Propofol infusion and Treatment may vary due to age or medical condition  Airway Management Planned: Simple Face Mask  Additional Equipment:   Intra-op Plan:   Post-operative Plan:   Informed Consent: I have reviewed the patients History and Physical, chart, labs and discussed the procedure including the risks, benefits and alternatives for the proposed anesthesia with the patient or authorized representative who has indicated his/her understanding and acceptance.     Dental advisory given  Plan Discussed with:  CRNA  Anesthesia Plan Comments:        Anesthesia Quick Evaluation

## 2021-10-17 ENCOUNTER — Ambulatory Visit (HOSPITAL_COMMUNITY)
Admission: RE | Admit: 2021-10-17 | Discharge: 2021-10-17 | Disposition: A | Discharge status: Home / Self Care | Payer: BC Managed Care – PPO | Source: Ambulatory Visit | Attending: Gastroenterology | Admitting: Gastroenterology

## 2021-10-17 ENCOUNTER — Ambulatory Visit (HOSPITAL_COMMUNITY): Payer: BC Managed Care – PPO | Admitting: Anesthesiology

## 2021-10-17 ENCOUNTER — Encounter (HOSPITAL_COMMUNITY)
Admission: RE | Disposition: A | Discharge status: Home / Self Care | Payer: Self-pay | Source: Ambulatory Visit | Attending: Gastroenterology

## 2021-10-17 ENCOUNTER — Encounter (HOSPITAL_COMMUNITY): Payer: Self-pay | Admitting: Gastroenterology

## 2021-10-17 ENCOUNTER — Other Ambulatory Visit: Payer: Self-pay

## 2021-10-17 DIAGNOSIS — K635 Polyp of colon: Secondary | ICD-10-CM | POA: Diagnosis not present

## 2021-10-17 DIAGNOSIS — Z801 Family history of malignant neoplasm of trachea, bronchus and lung: Secondary | ICD-10-CM | POA: Diagnosis not present

## 2021-10-17 DIAGNOSIS — Z1211 Encounter for screening for malignant neoplasm of colon: Secondary | ICD-10-CM | POA: Diagnosis not present

## 2021-10-17 DIAGNOSIS — K5730 Diverticulosis of large intestine without perforation or abscess without bleeding: Secondary | ICD-10-CM | POA: Diagnosis not present

## 2021-10-17 DIAGNOSIS — Z87891 Personal history of nicotine dependence: Secondary | ICD-10-CM | POA: Diagnosis not present

## 2021-10-17 DIAGNOSIS — I214 Non-ST elevation (NSTEMI) myocardial infarction: Secondary | ICD-10-CM | POA: Diagnosis not present

## 2021-10-17 DIAGNOSIS — D123 Benign neoplasm of transverse colon: Secondary | ICD-10-CM | POA: Diagnosis not present

## 2021-10-17 DIAGNOSIS — D124 Benign neoplasm of descending colon: Secondary | ICD-10-CM | POA: Insufficient documentation

## 2021-10-17 HISTORY — PX: POLYPECTOMY: SHX5525

## 2021-10-17 HISTORY — PX: COLONOSCOPY WITH PROPOFOL: SHX5780

## 2021-10-17 LAB — GLUCOSE, CAPILLARY: Glucose-Capillary: 96 mg/dL (ref 70–99)

## 2021-10-17 SURGERY — COLONOSCOPY WITH PROPOFOL
Anesthesia: Monitor Anesthesia Care

## 2021-10-17 MED ORDER — LACTATED RINGERS IV SOLN
INTRAVENOUS | Status: DC
  Administered 2021-10-17: 10 mL via INTRAVENOUS

## 2021-10-17 MED ORDER — PROPOFOL 500 MG/50ML IV EMUL
INTRAVENOUS | Status: DC | PRN
  Administered 2021-10-17 (×3): 30 mg via INTRAVENOUS

## 2021-10-17 MED ORDER — PROPOFOL 500 MG/50ML IV EMUL
INTRAVENOUS | Status: AC
  Filled 2021-10-17: qty 50

## 2021-10-17 MED ORDER — PROPOFOL 500 MG/50ML IV EMUL
INTRAVENOUS | Status: DC | PRN
  Administered 2021-10-17: 125 ug/kg/min via INTRAVENOUS

## 2021-10-17 MED ORDER — SODIUM CHLORIDE 0.9 % IV SOLN: INTRAVENOUS | Status: DC

## 2021-10-17 MED ORDER — PROPOFOL 1000 MG/100ML IV EMUL
INTRAVENOUS | Status: AC
  Filled 2021-10-17: qty 100

## 2021-10-17 SURGICAL SUPPLY — 22 items

## 2021-10-17 NOTE — Anesthesia Postprocedure Evaluation (Signed)
Anesthesia Post Note  Patient: Jerry Shaffer  Procedure(s) Performed: COLONOSCOPY WITH PROPOFOL POLYPECTOMY     Patient location during evaluation: Endoscopy Anesthesia Type: MAC Level of consciousness: awake and alert Pain management: pain level controlled Vital Signs Assessment: post-procedure vital signs reviewed and stable Respiratory status: spontaneous breathing, nonlabored ventilation and respiratory function stable Cardiovascular status: stable and blood pressure returned to baseline Postop Assessment: no apparent nausea or vomiting Anesthetic complications: no   No notable events documented.  Last Vitals:  Vitals:   10/17/21 0850 10/17/21 0904  BP: (!) 100/52 117/67  Pulse: 99 98  Resp: (!) 21 20  Temp:    SpO2: 100% 100%    Last Pain:  Vitals:   10/17/21 0623  TempSrc: Oral  PainSc: 0-No pain                 Saavi Mceachron,W. EDMOND

## 2021-10-17 NOTE — Transfer of Care (Signed)
Immediate Anesthesia Transfer of Care Note  Patient: Jerry Shaffer  Procedure(s) Performed: COLONOSCOPY WITH PROPOFOL POLYPECTOMY  Patient Location: PACU  Anesthesia Type:MAC  Level of Consciousness: sedated and responds to stimulation  Airway & Oxygen Therapy: Patient Spontanous Breathing and Patient connected to face mask oxygen  Post-op Assessment: Report given to RN and Post -op Vital signs reviewed and stable  Post vital signs: Reviewed and stable  Last Vitals:  Vitals Value Taken Time  BP    Temp    Pulse    Resp    SpO2      Last Pain:  Vitals:   10/17/21 0623  TempSrc: Oral  PainSc: 0-No pain         Complications: No notable events documented.

## 2021-10-17 NOTE — Op Note (Addendum)
[facility] Patient Name: Jerry Shaffer Procedure Date: 10/17/2021 MRN: [id number] Attending MD: Milus Banister , MD Date of Birth: Dec 23, 1960 CSN: [id number] Age: 60 Admit Type: Outpatient Procedure:                Colonoscopy Indications:              Screening for colorectal malignant neoplasm Providers:                Milus Banister, MD, Brooke Person, Luan Moore, Technician, Herbie Drape, CRNA Referring MD:              Medicines:                Monitored Anesthesia Care Complications:            No immediate complications. Estimated blood loss:                            None. Estimated Blood Loss:     Estimated blood loss: none. Procedure:                Pre-Anesthesia Assessment:                           - Prior to the procedure, a History and Physical                            was performed, and patient medications and                            allergies were reviewed. The patient's tolerance of                            previous anesthesia was also reviewed. The risks                            and benefits of the procedure and the sedation                            options and risks were discussed with the patient.                            All questions were answered, and informed consent                            was obtained. Prior Anticoagulants: The patient has                            taken Plavix (clopidogrel), last dose was 5 days                            prior to procedure. ASA Grade Assessment: III - A  patient with severe systemic disease. After                            reviewing the risks and benefits, the patient was                            deemed in satisfactory condition to undergo the                            procedure.                           After obtaining informed consent, the colonoscope                            was passed under direct vision.  Throughout the                            procedure, the patient's blood pressure, pulse, and                            oxygen saturations were monitored continuously. The                            CF-HQ190L ([id number]) Olympus colonoscope was                            introduced through the anus and advanced to the the                            cecum, identified by appendiceal orifice and                            ileocecal valve. The colonoscopy was performed                            without difficulty. The patient tolerated the                            procedure well. The quality of the bowel                            preparation was good. The ileocecal valve,                            appendiceal orifice, and rectum were photographed. Scope In: 8:27:26 AM Scope Out: 8:41:45 AM Scope Withdrawal Time: 0 hours 9 minutes 32 seconds  Total Procedure Duration: 0 hours 14 minutes 19 seconds  Findings:      Two sessile polyps were found in the descending colon and transverse       colon. The polyps were 4 to 6 mm in size. These polyps were removed with       a cold snare. Resection and retrieval were complete.      Multiple small and large-mouthed diverticula were found in the left  colon.      The exam was otherwise without abnormality on direct and retroflexion       views. Impression:               - Two 4 to 6 mm polyps in the descending colon and                            in the transverse colon, removed with a cold snare.                            Resected and retrieved.                           - Diverticulosis in the left colon.                           - The examination was otherwise normal on direct                            and retroflexion views. Moderate Sedation:      Not Applicable - Patient had care per Anesthesia. Recommendation:           - Patient has a contact number available for                            emergencies. The signs and symptoms of  potential                            delayed complications were discussed with the                            patient. Return to normal activities tomorrow.                            Written discharge instructions were provided to the                            patient.                           - Resume previous diet.                           - Continue present medications. You can resume your                            plavix tomorrow.                           - Await pathology results. Procedure Code(s):        --- Professional ---                           [phone], Colonoscopy, flexible; with removal of  tumor(s), polyp(s), or other lesion(s) by snare                            technique Diagnosis Code(s):        --- Professional ---                           Z12.11, Encounter for screening for malignant                            neoplasm of colon                           K63.5, Polyp of colon                           K57.30, Diverticulosis of large intestine without                            perforation or abscess without bleeding CPT copyright 2019 American Medical Association. All rights reserved. The codes documented in this report are preliminary and upon coder review may  be revised to meet current compliance requirements. Milus Banister, MD 10/17/2021 8:45:31 AM This report has been signed electronically. Number of Addenda: 0

## 2021-10-17 NOTE — H&P (Signed)
HPI: This is a man at routine risk for colon cancer   ROS: complete GI ROS as described in HPI, all other review negative.  Constitutional:  No unintentional weight loss   Past Medical History:  Diagnosis Date   Bipolar 1 disorder ([place])    Coronary artery disease    Diabetes mellitus    Dyslipidemia    Emphysema    Mental disorder    BIPOLAR DISORDER   Migraine    NSTEMI (non-ST elevated myocardial infarction) ([place]) 09/20/2020   PONV (postoperative nausea and vomiting)    Renal disorder    Shortness of breath    Stroke ([place])    Transient ischemic attack (TIA)     Past Surgical History:  Procedure Laterality Date   CORONARY ANGIOGRAPHY  09/20/2020   CORONARY STENT INTERVENTION  09/20/2020   CORONARY STENT INTERVENTION   CORONARY STENT INTERVENTION N/A 09/20/2020   Procedure: CORONARY STENT INTERVENTION;  Surgeon: Belva Crome, MD;  Location: [place] [facility];  Service: Cardiovascular;  Laterality: N/A;   CORONARY STENT INTERVENTION N/A 03/28/2021   Procedure: CORONARY STENT INTERVENTION;  Surgeon: [name], MD;  Location: [place] [facility];  Service: Cardiovascular;  Laterality: N/A;   HEMORROIDECTOMY     KNEE ARTHROSCOPY     LEFT HEART CATH AND CORONARY ANGIOGRAPHY N/A 09/20/2020   Procedure: LEFT HEART CATH AND CORONARY ANGIOGRAPHY;  Surgeon: Belva Crome, MD;  Location: [place] [facility];  Service: Cardiovascular;  Laterality: N/A;   LEFT HEART CATH AND CORONARY ANGIOGRAPHY N/A 03/28/2021   Procedure: LEFT HEART CATH AND CORONARY ANGIOGRAPHY;  Surgeon: [name], MD;  Location: [place] [facility];  Service: Cardiovascular;  Laterality: N/A;   LUMBAR DISC SURGERY     TONSILLECTOMY AND ADENOIDECTOMY      Current Facility-Administered Medications  Medication Dose Route Frequency Provider Last Rate Last Admin   0.9 %  sodium chloride infusion   Intravenous Continuous Milus Banister, MD       lactated ringers infusion   Intravenous Continuous Milus Banister, MD 10 mL/hr at 10/17/21 0659 10 mL at 10/17/21 0659    Allergies as of 08/27/2021 - Review Complete 08/27/2021  Allergen Reaction Noted   Bee venom Anaphylaxis 12/15/2012   Penicillins Anaphylaxis 07/25/2010   Procaine Anaphylaxis 12/15/2012   Isosorbide  05/10/2021    Family History  Problem Relation Age of Onset   Diabetes type II Other    Coronary artery disease Other    Bipolar disorder Other    Lung cancer Father    CAD Mother 79    Social History   Socioeconomic History   Marital status: Single    Spouse name: Not on file   Number of children: Not on file   Years of education: Not on file   Highest education level: Not on file  Occupational History   Occupation: Security guard    Employer: [employer]  Tobacco Use   Smoking status: Former    Types: E-cigarettes   Smokeless tobacco: Current  Vaping Use   Vaping Use: Never used  Substance and Sexual Activity   Alcohol use: No   Drug use: No   Sexual activity: Not Currently  Other Topics Concern   Not on file  Social History Narrative   Not on file   Social Determinants of Health   Financial Resource Strain: Not on file  Food Insecurity: Not on file  Transportation Needs: Not on file  Physical Activity:  Not on file  Stress: Not on file  Social Connections: Not on file  Intimate Partner Violence: Not on file     Physical Exam: BP (!) 154/96   Pulse 93   Temp 97.9 F (36.6 C) (Oral)   Resp 11   Ht 5\' 8"  (1.727 m)   Wt 106.3 kg   SpO2 97%   BMI 35.63 kg/m  Constitutional: generally well-appearing Psychiatric: alert and oriented x3 Abdomen: soft, nontender, nondistended, no obvious ascites, no peritoneal signs, normal bowel sounds No peripheral edema noted in lower extremities  Assessment and plan: 60 y.o. male with routine risk for colon cancer  Screening colonoscopy today  Please see the "Patient Instructions" section for addition details about the plan.  Owens Loffler,  MD [facility] 10/17/2021, 7:06 AM

## 2021-10-18 ENCOUNTER — Encounter (HOSPITAL_COMMUNITY): Payer: Self-pay | Admitting: Gastroenterology

## 2021-10-18 LAB — SURGICAL PATHOLOGY

## 2021-11-05 ENCOUNTER — Telehealth: Payer: Self-pay | Admitting: Endocrinology

## 2021-11-05 ENCOUNTER — Other Ambulatory Visit: Payer: Self-pay

## 2021-11-05 DIAGNOSIS — E1159 Type 2 diabetes mellitus with other circulatory complications: Secondary | ICD-10-CM

## 2021-11-05 MED ORDER — METFORMIN HCL ER 500 MG PO TB24: 2000.0000 mg | ORAL_TABLET | Freq: Every day | ORAL | 3 refills | Status: DC

## 2021-11-13 NOTE — Telephone Encounter (Signed)
NA

## 2022-01-03 ENCOUNTER — Other Ambulatory Visit: Payer: Self-pay

## 2022-01-03 ENCOUNTER — Emergency Department (HOSPITAL_COMMUNITY)
Admission: EM | Admit: 2022-01-03 | Discharge: 2022-01-04 | Disposition: A | Discharge status: Home / Self Care | Payer: Managed Care, Other (non HMO) | Attending: Emergency Medicine | Admitting: Emergency Medicine

## 2022-01-03 ENCOUNTER — Emergency Department (HOSPITAL_COMMUNITY): Payer: Managed Care, Other (non HMO)

## 2022-01-03 ENCOUNTER — Encounter (HOSPITAL_COMMUNITY): Payer: Self-pay | Admitting: Emergency Medicine

## 2022-01-03 DIAGNOSIS — J209 Acute bronchitis, unspecified: Secondary | ICD-10-CM | POA: Diagnosis not present

## 2022-01-03 DIAGNOSIS — Z7982 Long term (current) use of aspirin: Secondary | ICD-10-CM | POA: Diagnosis not present

## 2022-01-03 DIAGNOSIS — R0789 Other chest pain: Secondary | ICD-10-CM | POA: Diagnosis present

## 2022-01-03 DIAGNOSIS — R079 Chest pain, unspecified: Secondary | ICD-10-CM

## 2022-01-03 DIAGNOSIS — Z7902 Long term (current) use of antithrombotics/antiplatelets: Secondary | ICD-10-CM | POA: Diagnosis not present

## 2022-01-03 DIAGNOSIS — Z20822 Contact with and (suspected) exposure to covid-19: Secondary | ICD-10-CM | POA: Insufficient documentation

## 2022-01-03 LAB — CBC WITH DIFFERENTIAL/PLATELET
Abs Immature Granulocytes: 0.03 10*3/uL (ref 0.00–0.07)
Basophils Absolute: 0.1 10*3/uL (ref 0.0–0.1)
Basophils Relative: 1 %
Eosinophils Absolute: 0.3 10*3/uL (ref 0.0–0.5)
Eosinophils Relative: 3 %
HCT: 43.1 % (ref 39.0–52.0)
Hemoglobin: 14.1 g/dL (ref 13.0–17.0)
Immature Granulocytes: 0 %
Lymphocytes Relative: 23 %
Lymphs Abs: 1.8 10*3/uL (ref 0.7–4.0)
MCH: 28.6 pg (ref 26.0–34.0)
MCHC: 32.7 g/dL (ref 30.0–36.0)
MCV: 87.4 fL (ref 80.0–100.0)
Monocytes Absolute: 0.5 10*3/uL (ref 0.1–1.0)
Monocytes Relative: 6 %
Neutro Abs: 5.5 10*3/uL (ref 1.7–7.7)
Neutrophils Relative %: 67 %
Platelets: 245 10*3/uL (ref 150–400)
RBC: 4.93 MIL/uL (ref 4.22–5.81)
RDW: 13.4 % (ref 11.5–15.5)
WBC: 8.2 10*3/uL (ref 4.0–10.5)
nRBC: 0 % (ref 0.0–0.2)

## 2022-01-03 LAB — COMPREHENSIVE METABOLIC PANEL
ALT: 20 U/L (ref 0–44)
AST: 18 U/L (ref 15–41)
Albumin: 3.7 g/dL (ref 3.5–5.0)
Alkaline Phosphatase: 83 U/L (ref 38–126)
Anion gap: 7 (ref 5–15)
BUN: 14 mg/dL (ref 6–20)
CO2: 24 mmol/L (ref 22–32)
Calcium: 8.8 mg/dL — ABNORMAL LOW (ref 8.9–10.3)
Chloride: 106 mmol/L (ref 98–111)
Creatinine, Ser: 0.84 mg/dL (ref 0.61–1.24)
GFR, Estimated: >60 mL/min (ref >60)
Glucose, Bld: 152 mg/dL — ABNORMAL HIGH (ref 70–99)
Potassium: 3.8 mmol/L (ref 3.5–5.1)
Sodium: 137 mmol/L (ref 135–145)
Total Bilirubin: 0.5 mg/dL (ref 0.3–1.2)
Total Protein: 5.8 g/dL — ABNORMAL LOW (ref 6.5–8.1)

## 2022-01-03 LAB — TROPONIN I (HIGH SENSITIVITY)
Troponin I (High Sensitivity): 9 ng/L (ref <18)
Troponin I (High Sensitivity): 10 ng/L (ref <18)

## 2022-01-03 LAB — RESP PANEL BY RT-PCR (FLU A&B, COVID) ARPGX2
Influenza A by PCR: NEGATIVE
Influenza B by PCR: NEGATIVE
SARS Coronavirus 2 by RT PCR: NEGATIVE

## 2022-01-03 MED ORDER — ALBUTEROL SULFATE (2.5 MG/3ML) 0.083% IN NEBU
2.5000 mg | INHALATION_SOLUTION | Freq: Once | RESPIRATORY_TRACT | Status: AC
Start: 2022-01-03 — End: 2022-01-03
  Administered 2022-01-03: 2.5 mg via RESPIRATORY_TRACT
  Filled 2022-01-03: qty 3

## 2022-01-03 MED ORDER — SODIUM CHLORIDE 0.9 % IV BOLUS
500.0000 mL | Freq: Once | INTRAVENOUS | Status: AC
  Administered 2022-01-03: 500 mL via INTRAVENOUS

## 2022-01-03 MED ORDER — ALBUTEROL SULFATE (2.5 MG/3ML) 0.083% IN NEBU
2.5000 mg | INHALATION_SOLUTION | Freq: Once | RESPIRATORY_TRACT | Status: AC
  Administered 2022-01-03: 2.5 mg via RESPIRATORY_TRACT
  Filled 2022-01-03: qty 3

## 2022-01-03 MED ORDER — IOHEXOL 350 MG/ML SOLN
80.0000 mL | Freq: Once | INTRAVENOUS | Status: AC | PRN
  Administered 2022-01-03: 80 mL via INTRAVENOUS

## 2022-01-03 MED ORDER — PREDNISONE 20 MG PO TABS
60.0000 mg | ORAL_TABLET | Freq: Once | ORAL | Status: AC
  Administered 2022-01-03: 60 mg via ORAL
  Filled 2022-01-03: qty 3

## 2022-01-03 NOTE — ED Triage Notes (Signed)
Patient reports left chest pain radiating to left arm today with SOB and cough with bloody phlegm .

## 2022-01-03 NOTE — ED Provider Triage Note (Signed)
Emergency Medicine Provider Triage Evaluation Note  Jerry Shaffer , a 61 y.o. male  was evaluated in triage.  Pt complains of coughing up blood and chest pain  hx of MI  Review of Systems  Positive: cough Negative: fever  Physical Exam  BP (!) 185/95 (BP Location: Right Arm)    Pulse 93    Temp 98.8 F (37.1 C) (Oral)    Resp (!) 28    Ht 5\' 8"  (1.727 m)    Wt 115 kg    SpO2 98%    BMI 38.55 kg/m  Gen:   Awake, no distress   Resp:  Normal effort  MSK:   Moves extremities without difficulty  Other:   Medical Decision Making  Medically screening exam initiated at 7:51 PM.  Appropriate orders placed.  Jerry Shaffer was informed that the remainder of the evaluation will be completed by another provider, this initial triage assessment does not replace that evaluation, and the importance of remaining in the ED until their evaluation is complete.     Jerry Shaffer, [place] 01/03/22 1952

## 2022-01-03 NOTE — ED Provider Notes (Signed)
[facility] EMERGENCY DEPARTMENT Provider Note   CSN: [id number] Arrival date & time: 01/03/22  1913     History  Chief Complaint  Patient presents with   Chest Pain    Cough/Bloody Phlegm     Jerry Shaffer is a 61 y.o. male.  HPI 61 year old male presents with a chief complaint of hemoptysis.  He states he has had a cough for about 8 months.  Occasionally he will see some blood in the sputum.  However today at around 2 PM he started to cough and noticed that it seemed like there was a blood clot that came out.  Since then he had more blood when he was coughing and now it has turned into streaks in his sputum.  He is also been having some left-sided chest pain that feels like a soreness and heaviness to his chest.  This is different than when he had his heart attack which was more of a reflux type feeling.  He is also having some pain going into his left arm.  Some shortness of breath as well.  He took a nebulizer which partially helped.  He is on Plavix and aspirin for a prior MI with stenting.  He denies any leg swelling or abdominal pain.   Home Medications Prior to Admission medications   Medication Sig Start Date End Date Taking? Authorizing Provider  acetaminophen (TYLENOL) 500 MG tablet Take 1,000 mg by mouth every 6 (six) hours as needed for mild pain or headache.    Yes [provider]  albuterol (VENTOLIN HFA) 108 (90 Base) MCG/ACT inhaler Inhale 1-2 puffs into the lungs every 6 (six) hours as needed for wheezing or shortness of breath. 01/04/22  Yes Sherwood Gambler, MD  aspirin 81 MG chewable tablet Chew 1 tablet (81 mg total) by mouth daily. Patient taking differently: Chew 81 mg by mouth at bedtime. 09/22/20  Yes Barrett, Evelene Croon, PA-C  Aspirin-Salicylamide-Caffeine (BC HEADACHE POWDER PO) Take 1 packet by mouth daily as needed (for headaches or pain).   Yes [provider]  atorvastatin (LIPITOR) 80 MG tablet TAKE 1 TABLET BY MOUTH EVERY  DAY Patient taking differently: Take 80 mg by mouth in the morning. 07/19/21  Yes Lendon Colonel, NP  carvedilol (COREG) 6.25 MG tablet Take 1 tablet (6.25 mg total) by mouth 2 (two) times daily with a meal. 03/07/21  Yes Croitoru, Mihai, MD  clopidogrel (PLAVIX) 75 MG tablet Take 1 tablet (75 mg total) by mouth daily with breakfast. Patient taking differently: Take 75 mg by mouth every evening. 08/13/21  Yes Croitoru, Mihai, MD  Dulaglutide (TRULICITY) 3 ID/7.8EU SOPN Inject 3 mg as directed once a week. Patient taking differently: Inject 3 mg as directed every Sunday. 10/11/21  Yes Renato Shin, MD  EPINEPHrine 0.3 mg/0.3 mL IJ SOAJ injection Inject 0.3 mg into the muscle as needed for anaphylaxis. 01/02/21  Yes Renato Shin, MD  Fluticasone-Salmeterol (ADVAIR DISKUS) 250-50 MCG/DOSE AEPB Take 1 puff twice daily.  Use regularly, not a rescue inhaler. Patient taking differently: Inhale 1 puff into the lungs daily as needed (for flares). 02/21/19  Yes Molpus, John, MD  Ibuprofen (ADVIL) 200 MG CAPS Take 400 mg by mouth every 8 (eight) hours as needed (for pain or headaches).   Yes [provider]  insulin degludec (TRESIBA FLEXTOUCH) 100 UNIT/ML FlexTouch Pen Inject 20 Units into the skin daily. And pen needles 1/day Patient taking differently: Inject 20 Units into the skin daily.  10/11/21  Yes Renato Shin, MD  metFORMIN (GLUCOPHAGE-XR) 500 MG 24 hr tablet Take 4 tablets (2,000 mg total) by mouth daily with breakfast. Patient taking differently: Take 1,000 mg by mouth 2 (two) times daily with a meal. 11/05/21  Yes Renato Shin, MD  nitroGLYCERIN (NITROSTAT) 0.4 MG SL tablet Place 1 tablet (0.4 mg total) under the tongue every 5 (five) minutes x 3 doses as needed for chest pain. 03/12/21  Yes Croitoru, Mihai, MD  predniSONE (DELTASONE) 20 MG tablet 2 tabs po daily x 4 days 01/04/22  Yes Sherwood Gambler, MD  BD PEN NEEDLE NANO 2ND GEN 32G X 4 MM MISC USE TO INJECT 20 UNITS INSULIN DAILY  07/26/21   Renato Shin, MD  Continuous Blood Gluc Sensor (FREESTYLE LIBRE 2 SENSOR) MISC 1 Device by Does not apply route every 14 (fourteen) days. Patient not taking: Reported on 01/03/2022 01/11/21   Renato Shin, MD  lidocaine (LIDODERM) 5 % Place 1 patch onto the skin daily as needed (pain). Remove & Discard patch within 12 hours or as directed by MD Patient not taking: Reported on 01/03/2022    [provider]      Allergies    Bee venom, Penicillins, Procaine, Isosorbide, and Tape    Review of Systems   Review of Systems  Respiratory:  Positive for cough and shortness of breath.   Cardiovascular:  Positive for chest pain.  Gastrointestinal:  Negative for abdominal pain.   Physical Exam Updated Vital Signs BP (!) 176/96    Pulse 95    Temp 98.8 F (37.1 C) (Oral)    Resp (!) 23    Ht 5\' 8"  (1.727 m)    Wt 115 kg    SpO2 93%    BMI 38.55 kg/m  Physical Exam Vitals and nursing note reviewed.  Constitutional:      General: He is not in acute distress.    Appearance: He is well-developed. He is not ill-appearing or diaphoretic.  HENT:     Head: Normocephalic and atraumatic.  Cardiovascular:     Rate and Rhythm: Normal rate and regular rhythm.     Heart sounds: Normal heart sounds.  Pulmonary:     Effort: Pulmonary effort is normal.     Breath sounds: Wheezing (mild, expiratory, L>R) present.  Chest:     Chest wall: Tenderness (mild soreness to left anterior chest) present.  Abdominal:     Palpations: Abdomen is soft.     Tenderness: There is no abdominal tenderness.  Skin:    General: Skin is warm and dry.  Neurological:     Mental Status: He is alert.    ED Results / Procedures / Treatments   Labs (all labs ordered are listed, but only abnormal results are displayed) Labs Reviewed  COMPREHENSIVE METABOLIC PANEL - Abnormal; Notable for the following components:      Result Value   Glucose, Bld 152 (*)    Calcium 8.8 (*)    Total Protein 5.8 (*)    All  other components within normal limits  RESP PANEL BY RT-PCR (FLU A&B, COVID) ARPGX2  CBC WITH DIFFERENTIAL/PLATELET  TROPONIN I (HIGH SENSITIVITY)  TROPONIN I (HIGH SENSITIVITY)    EKG EKG Interpretation  Date/Time:  Friday January 03 2022 19:25:56 EST Ventricular Rate:  100 PR Interval:  148 QRS Duration: 84 QT Interval:  350 QTC Calculation: 451 R Axis:   33 Text Interpretation: Normal sinus rhythm no acute ST/T changes similar to April 2022 Confirmed by  Sherwood Gambler [phone]) on 01/03/2022 9:35:41 PM  Radiology DG Chest 1 View  Result Date: 01/03/2022 CLINICAL DATA:  Left-sided chest pain radiating to the left arm with shortness of breath and cough. EXAM: CHEST  1 VIEW COMPARISON:  November 19, 2020 FINDINGS: The heart size and mediastinal contours are within normal limits. Both lungs are clear. The visualized skeletal structures are unremarkable. IMPRESSION: No active disease. Electronically Signed   By: Virgina Norfolk M.D.   On: 01/03/2022 20:40   CT Angio Chest PE W and/or Wo Contrast  Result Date: 01/03/2022 CLINICAL DATA:  Left-sided chest pain radiating to the left arm. EXAM: CT ANGIOGRAPHY CHEST WITH CONTRAST TECHNIQUE: Multidetector CT imaging of the chest was performed using the standard protocol during bolus administration of intravenous contrast. Multiplanar CT image reconstructions and MIPs were obtained to evaluate the vascular anatomy. RADIATION DOSE REDUCTION: This exam was performed according to the departmental dose-optimization program which includes automated exposure control, adjustment of the mA and/or kV according to patient size and/or use of iterative reconstruction technique. CONTRAST:  20mL OMNIPAQUE IOHEXOL 350 MG/ML SOLN COMPARISON:  None. FINDINGS: Cardiovascular: There is mild calcification of the aortic arch, without evidence of aortic aneurysm satisfactory opacification of the pulmonary arteries to the segmental level. No evidence of pulmonary embolism.  Normal heart size with marked severity coronary artery calcification. No pericardial effusion. Mediastinum/Nodes: No enlarged mediastinal, hilar, or axillary lymph nodes. Thyroid gland, trachea, and esophagus demonstrate no significant findings. Lungs/Pleura: Very mild atelectatic changes are seen within the dependent portions of both lungs. Very small bilateral pleural effusions are noted. No pneumothorax is identified. Upper Abdomen: Noninflamed diverticula are seen throughout the large bowel. Musculoskeletal: No chest wall abnormality. No acute or significant osseous findings. Review of the MIP images confirms the above findings. IMPRESSION: 1. No CT evidence of pulmonary embolism. 2. Very small bilateral pleural effusions. 3. Marked severity coronary artery calcification. 4. Colonic diverticulosis. Aortic Atherosclerosis (ICD10-I70.0). Electronically Signed   By: Virgina Norfolk M.D.   On: 01/03/2022 22:53    Procedures Procedures    Medications Ordered in ED Medications  albuterol (VENTOLIN HFA) 108 (90 Base) MCG/ACT inhaler 2 puff (has no administration in time range)  sodium chloride 0.9 % bolus 500 mL (0 mLs Intravenous Stopped 01/03/22 2331)  albuterol (PROVENTIL) (2.5 MG/3ML) 0.083% nebulizer solution 2.5 mg (2.5 mg Nebulization Given 01/03/22 2218)  albuterol (PROVENTIL) (2.5 MG/3ML) 0.083% nebulizer solution 2.5 mg (2.5 mg Nebulization Given 01/03/22 2218)  iohexol (OMNIPAQUE) 350 MG/ML injection 80 mL (80 mLs Intravenous Contrast Given 01/03/22 2229)  albuterol (PROVENTIL) (2.5 MG/3ML) 0.083% nebulizer solution 2.5 mg (2.5 mg Nebulization Given 01/03/22 2337)  albuterol (PROVENTIL) (2.5 MG/3ML) 0.083% nebulizer solution 2.5 mg (2.5 mg Nebulization Given 01/03/22 2337)  predniSONE (DELTASONE) tablet 60 mg (60 mg Oral Given 01/03/22 2337)    ED Course/ Medical Decision Making/ A&P                            Patient most likely has acute bronchitis.  He has not been coughing up any blood  since being in the emergency department.  However given his presentation along with previous smoking history and shortness of breath, CTA was obtained to rule out PE, mass, etc.  I have reviewed and interpreted the CT and chest x-ray images and see no obvious pneumonia, PE, or other emergent findings.  He is feeling better after an albuterol treatment and was given a second and  now is doing better and feels well enough for discharge.  Since I suspect bronchitis I will also prescribe albuterol.  He was given an albuterol inhaler here as he does not have a PCP or any albuterol at home.  I have reviewed and interpreted his labs which are overall unremarkable including troponin is negative x2.    Chart review shows that in April 2022 he had a left heart cath with stent placement.  While he does have chest pain my suspicion for ACS is pretty low in this presentation.  EKG has been reviewed and interpreted and is benign, no acute ischemia.  At this point, he appears stable for discharge home with return precautions.        Final Clinical Impression(s) / ED Diagnoses Final diagnoses:  Acute bronchitis, unspecified organism  Nonspecific chest pain    Rx / DC Orders ED Discharge Orders          Ordered    albuterol (VENTOLIN HFA) 108 (90 Base) MCG/ACT inhaler  Every 6 hours PRN        01/04/22 0008    predniSONE (DELTASONE) 20 MG tablet        01/04/22 0008              Sherwood Gambler, MD 01/04/22 0013

## 2022-01-04 DIAGNOSIS — J209 Acute bronchitis, unspecified: Secondary | ICD-10-CM | POA: Diagnosis not present

## 2022-01-04 MED ORDER — ALBUTEROL SULFATE HFA 108 (90 BASE) MCG/ACT IN AERS
2.0000 | INHALATION_SPRAY | Freq: Once | RESPIRATORY_TRACT | Status: AC
  Administered 2022-01-04: 2 via RESPIRATORY_TRACT
  Filled 2022-01-04: qty 6.7

## 2022-01-04 MED ORDER — PREDNISONE 20 MG PO TABS: ORAL_TABLET | ORAL | 0 refills | Status: DC

## 2022-01-04 MED ORDER — ALBUTEROL SULFATE HFA 108 (90 BASE) MCG/ACT IN AERS
1.0000 | INHALATION_SPRAY | Freq: Four times a day (QID) | RESPIRATORY_TRACT | 0 refills | Status: DC | PRN
Start: 2022-01-04 — End: 2022-04-17

## 2022-01-04 NOTE — ED Notes (Signed)
Patient verbalizes understanding of discharge instructions. Opportunity for questioning and answers were provided. Armband removed by staff, pt discharged from ED. Ambulated out to lobby  

## 2022-01-04 NOTE — Discharge Instructions (Addendum)
If you develop fever, new or worsening shortness of breath, new or worsening chest pain, blood in your sputum, or any other new/concerning symptoms the return to the ER for evaluation.

## 2022-01-17 ENCOUNTER — Other Ambulatory Visit: Payer: Self-pay

## 2022-01-17 ENCOUNTER — Encounter: Payer: Self-pay | Admitting: Endocrinology

## 2022-01-17 ENCOUNTER — Ambulatory Visit (INDEPENDENT_AMBULATORY_CARE_PROVIDER_SITE_OTHER): Payer: BLUE CROSS/BLUE SHIELD | Admitting: Endocrinology

## 2022-01-17 VITALS — BP 130/70 | HR 93 | Ht 68.0 in | Wt 236.6 lb

## 2022-01-17 DIAGNOSIS — E1159 Type 2 diabetes mellitus with other circulatory complications: Secondary | ICD-10-CM | POA: Diagnosis not present

## 2022-01-17 LAB — POCT GLYCOSYLATED HEMOGLOBIN (HGB A1C): Hemoglobin A1C: 7.8 % — AB (ref 4.0–5.6)

## 2022-01-17 MED ORDER — TRULICITY 4.5 MG/0.5ML ~~LOC~~ SOAJ: 4.5000 mg | SUBCUTANEOUS | 3 refills | Status: DC

## 2022-01-17 MED ORDER — TRESIBA FLEXTOUCH 100 UNIT/ML ~~LOC~~ SOPN: 12.0000 [IU] | PEN_INJECTOR | Freq: Every day | SUBCUTANEOUS | 3 refills | Status: DC

## 2022-01-17 MED ORDER — METFORMIN HCL ER 500 MG PO TB24: 2000.0000 mg | ORAL_TABLET | Freq: Every day | ORAL | 3 refills | Status: DC

## 2022-01-17 NOTE — Patient Instructions (Addendum)
Please increase the Trulicity, and decrease insulin, as below.   Please continue the same metformin.  check your blood sugar twice a day.  vary the time of day when you check, between before the 3 meals, and at bedtime.  also check if you have symptoms of your blood sugar being too high or too low.  please keep a record of the readings and bring it to your next appointment here (or you can bring the meter itself).  You can write it on any piece of paper.  please call us sooner if your blood sugar goes below 70, or if you have a lot of readings over 200.   Please come back for a follow-up appointment in 3 months.

## 2022-01-17 NOTE — Progress Notes (Signed)
Subjective:    Patient ID: Jerry Shaffer, male    DOB: 11/18/1961, 61 y.o.   MRN: [id number]  HPI Pt returns for f/u of diabetes mellitus: DM type: Insulin-requiring type 2 DM: Dx'ed: 0347 Complications: CAD, PN, and TIA Therapy: insulin since 4259, Trulicity, and metformin.   DKA: never Severe hypoglycemia: never Pancreatitis: never Pancreatic imaging: normal on 2021 CT SDOH: he works [employer], 3rd shift.  Other: pt says this was precip by Seroquel; he also took insulin 2016-2017; he declines multiple daily injections; he stopped [place], out of fear of side effects.   Interval history: he takes insulin as rx'ed.  pt states he feels well in general.  no cbg record, but states cbg's vary from 95-280.   Past Medical History:  Diagnosis Date   Bipolar 1 disorder ([place])    Coronary artery disease    Diabetes mellitus    Dyslipidemia    Emphysema    Mental disorder    BIPOLAR DISORDER   Migraine    NSTEMI (non-ST elevated myocardial infarction) ([place]) 09/20/2020   PONV (postoperative nausea and vomiting)    Renal disorder    Shortness of breath    Stroke ([place])    Transient ischemic attack (TIA)     Past Surgical History:  Procedure Laterality Date   COLONOSCOPY WITH PROPOFOL N/A 10/17/2021   Procedure: COLONOSCOPY WITH PROPOFOL;  Surgeon: Milus Banister, MD;  Location: [facility];  Service: Endoscopy;  Laterality: N/A;   CORONARY ANGIOGRAPHY  09/20/2020   CORONARY STENT INTERVENTION  09/20/2020   CORONARY STENT INTERVENTION   CORONARY STENT INTERVENTION N/A 09/20/2020   Procedure: CORONARY STENT INTERVENTION;  Surgeon: Belva Crome, MD;  Location: [facility];  Service: Cardiovascular;  Laterality: N/A;   CORONARY STENT INTERVENTION N/A 03/28/2021   Procedure: CORONARY STENT INTERVENTION;  Surgeon: [name], MD;  Location: [facility];  Service: Cardiovascular;  Laterality: N/A;   HEMORROIDECTOMY     KNEE ARTHROSCOPY     LEFT HEART CATH AND  CORONARY ANGIOGRAPHY N/A 09/20/2020   Procedure: LEFT HEART CATH AND CORONARY ANGIOGRAPHY;  Surgeon: Belva Crome, MD;  Location: [facility];  Service: Cardiovascular;  Laterality: N/A;   LEFT HEART CATH AND CORONARY ANGIOGRAPHY N/A 03/28/2021   Procedure: LEFT HEART CATH AND CORONARY ANGIOGRAPHY;  Surgeon: [name], MD;  Location: [facility];  Service: Cardiovascular;  Laterality: N/A;   LUMBAR DISC SURGERY     POLYPECTOMY  10/17/2021   Procedure: POLYPECTOMY;  Surgeon: Milus Banister, MD;  Location: [facility];  Service: Endoscopy;;   TONSILLECTOMY AND ADENOIDECTOMY      Social History   Socioeconomic History   Marital status: Single    Spouse name: Not on file   Number of children: Not on file   Years of education: Not on file   Highest education level: Not on file  Occupational History   Occupation: Security guard    Employer: [employer]  Tobacco Use   Smoking status: Former    Types: E-cigarettes   Smokeless tobacco: Current  Vaping Use   Vaping Use: Never used  Substance and Sexual Activity   Alcohol use: No   Drug use: No   Sexual activity: Not Currently  Other Topics Concern   Not on file  Social History Narrative   Not on file   Social Determinants of Health   Financial Resource Strain: Not on file  Food Insecurity: Not on file  Transportation Needs: Not on file  Physical Activity: Not on file  Stress: Not on file  Social Connections: Not on file  Intimate Partner Violence: Not on file    Current Outpatient Medications on File Prior to Visit  Medication Sig Dispense Refill   acetaminophen (TYLENOL) 500 MG tablet Take 1,000 mg by mouth every 6 (six) hours as needed for mild pain or headache.      albuterol (VENTOLIN HFA) 108 (90 Base) MCG/ACT inhaler Inhale 1-2 puffs into the lungs every 6 (six) hours as needed for wheezing or shortness of breath. 6.7 g 0   aspirin 81 MG chewable tablet Chew 1 tablet (81 mg total) by mouth  daily. (Patient taking differently: Chew 81 mg by mouth at bedtime.)     Aspirin-Salicylamide-Caffeine (BC HEADACHE POWDER PO) Take 1 packet by mouth daily as needed (for headaches or pain).     atorvastatin (LIPITOR) 80 MG tablet TAKE 1 TABLET BY MOUTH EVERY DAY (Patient taking differently: Take 80 mg by mouth in the morning.) 30 tablet 11   BD PEN NEEDLE NANO 2ND GEN 32G X 4 MM MISC USE TO INJECT 20 UNITS INSULIN DAILY 200 each 3   carvedilol (COREG) 6.25 MG tablet Take 1 tablet (6.25 mg total) by mouth 2 (two) times daily with a meal. 60 tablet 11   clopidogrel (PLAVIX) 75 MG tablet Take 1 tablet (75 mg total) by mouth daily with breakfast. (Patient taking differently: Take 75 mg by mouth every evening.) 30 tablet 11   Continuous Blood Gluc Sensor (FREESTYLE LIBRE 2 SENSOR) MISC 1 Device by Does not apply route every 14 (fourteen) days. 6 each 3   EPINEPHrine 0.3 mg/0.3 mL IJ SOAJ injection Inject 0.3 mg into the muscle as needed for anaphylaxis. 1 each 0   Fluticasone-Salmeterol (ADVAIR DISKUS) 250-50 MCG/DOSE AEPB Take 1 puff twice daily.  Use regularly, not a rescue inhaler. (Patient taking differently: Inhale 1 puff into the lungs daily as needed (for flares).) 60 each 0   Ibuprofen (ADVIL) 200 MG CAPS Take 400 mg by mouth every 8 (eight) hours as needed (for pain or headaches).     lidocaine (LIDODERM) 5 % Place 1 patch onto the skin daily as needed (pain). Remove & Discard patch within 12 hours or as directed by MD     nitroGLYCERIN (NITROSTAT) 0.4 MG SL tablet Place 1 tablet (0.4 mg total) under the tongue every 5 (five) minutes x 3 doses as needed for chest pain. 25 tablet 3   No current facility-administered medications on file prior to visit.    Allergies  Allergen Reactions   Bee Venom Anaphylaxis   Penicillins Anaphylaxis    Did it involve swelling of the face/tongue/throat, SOB, or low BP? yes Did it involve sudden or severe rash/hives, skin peeling, or any reaction on the inside  of your mouth or nose? unknown Did you need to seek medical attention at a hospital or doctor's office? yes When did it last happen?      1967 If all above answers are "NO", may proceed with cephalosporin use.    Procaine Anaphylaxis   Isosorbide Other (See Comments)    Caused migraines   Tape Itching and Other (See Comments)    Certain medical tapes make the patient's skin ITCH    Family History  Problem Relation Age of Onset   Diabetes type II Other    Coronary artery disease Other    Bipolar disorder Other    Lung cancer Father  CAD Mother 16    BP 130/70 (BP Location: Left Arm, Patient Position: Sitting, Cuff Size: Normal)    Pulse 93    Ht 5\' 8"  (1.727 m)    Wt 236 lb 9.6 oz (107.3 kg)    SpO2 96%    BMI 35.97 kg/m    Review of Systems HB is less, but it persists    Objective:   Physical Exam   Lab Results  Component Value Date   HGBA1C 7.8 (A) 01/17/2022      Assessment & Plan:  Insulin-requiring type 2 DM: uncontrolled HB, due to Trulicity.  We discussed.  He wants to increase anyway.    Patient Instructions  Please increase the Trulicity, and decrease insulin, as below.   Please continue the same metformin.  check your blood sugar twice a day.  vary the time of day when you check, between before the 3 meals, and at bedtime.  also check if you have symptoms of your blood sugar being too high or too low.  please keep a record of the readings and bring it to your next appointment here (or you can bring the meter itself).  You can write it on any piece of paper.  please call us sooner if your blood sugar goes below 70, or if you have a lot of readings over 200.   Please come back for a follow-up appointment in 3 months.

## 2022-01-27 ENCOUNTER — Emergency Department (HOSPITAL_COMMUNITY): Payer: Worker's Compensation

## 2022-01-27 ENCOUNTER — Emergency Department (HOSPITAL_COMMUNITY)
Admission: EM | Admit: 2022-01-27 | Discharge: 2022-01-27 | Disposition: A | Discharge status: Home / Self Care | Payer: Worker's Compensation | Attending: Emergency Medicine | Admitting: Emergency Medicine

## 2022-01-27 ENCOUNTER — Encounter (HOSPITAL_COMMUNITY): Payer: Self-pay

## 2022-01-27 DIAGNOSIS — G8911 Acute pain due to trauma: Secondary | ICD-10-CM | POA: Diagnosis not present

## 2022-01-27 DIAGNOSIS — M25512 Pain in left shoulder: Secondary | ICD-10-CM | POA: Insufficient documentation

## 2022-01-27 DIAGNOSIS — R0781 Pleurodynia: Secondary | ICD-10-CM | POA: Insufficient documentation

## 2022-01-27 DIAGNOSIS — Z20822 Contact with and (suspected) exposure to covid-19: Secondary | ICD-10-CM | POA: Diagnosis not present

## 2022-01-27 DIAGNOSIS — Y990 Civilian activity done for income or pay: Secondary | ICD-10-CM | POA: Diagnosis not present

## 2022-01-27 LAB — RESP PANEL BY RT-PCR (FLU A&B, COVID) ARPGX2
Influenza A by PCR: NEGATIVE
Influenza B by PCR: NEGATIVE
SARS Coronavirus 2 by RT PCR: NEGATIVE

## 2022-01-27 LAB — TROPONIN I (HIGH SENSITIVITY): Troponin I (High Sensitivity): 8 ng/L (ref <18)

## 2022-01-27 LAB — CBC
HCT: 42.7 % (ref 39.0–52.0)
Hemoglobin: 14.1 g/dL (ref 13.0–17.0)
MCH: 29 pg (ref 26.0–34.0)
MCHC: 33 g/dL (ref 30.0–36.0)
MCV: 87.7 fL (ref 80.0–100.0)
Platelets: 219 10*3/uL (ref 150–400)
RBC: 4.87 MIL/uL (ref 4.22–5.81)
RDW: 13.7 % (ref 11.5–15.5)
WBC: 10.4 10*3/uL (ref 4.0–10.5)
nRBC: 0 % (ref 0.0–0.2)

## 2022-01-27 LAB — POCT I-STAT, CHEM 8
BUN: 15 mg/dL (ref 6–20)
Calcium, Ion: 1.16 mmol/L (ref 1.15–1.40)
Chloride: 104 mmol/L (ref 98–111)
Creatinine, Ser: 0.5 mg/dL — ABNORMAL LOW (ref 0.61–1.24)
Glucose, Bld: 138 mg/dL — ABNORMAL HIGH (ref 70–99)
HCT: 41 % (ref 39.0–52.0)
Hemoglobin: 13.9 g/dL (ref 13.0–17.0)
Potassium: 4.7 mmol/L (ref 3.5–5.1)
Sodium: 139 mmol/L (ref 135–145)
TCO2: 28 mmol/L (ref 22–32)

## 2022-01-27 MED ORDER — IOHEXOL 300 MG/ML  SOLN
100.0000 mL | Freq: Once | INTRAMUSCULAR | Status: AC | PRN
  Administered 2022-01-27: 75 mL via INTRAVENOUS

## 2022-01-27 MED ORDER — SODIUM CHLORIDE (PF) 0.9 % IJ SOLN
INTRAMUSCULAR | Status: AC
  Filled 2022-01-27: qty 50
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
## 2022-01-27 NOTE — ED Notes (Signed)
I provided reinforced discharge education based off of discharge instructions. Pt acknowledged and understood my education. Pt had no further questions/concerns for provider/myself.  °
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
## 2022-03-21 ENCOUNTER — Other Ambulatory Visit: Payer: Self-pay | Admitting: Physician Assistant

## 2022-03-26 ENCOUNTER — Telehealth: Payer: Self-pay | Admitting: Cardiovascular Disease

## 2022-03-26 MED ORDER — CARVEDILOL 6.25 MG PO TABS: 6.2500 mg | ORAL_TABLET | Freq: Two times a day (BID) | ORAL | 11 refills | Status: DC

## 2022-03-26 NOTE — Telephone Encounter (Signed)
?*  STAT* If patient is at the pharmacy, call can be transferred to refill team. ? ? ?1. Which medications need to be refilled? (please list name of each medication and dose if known) carvedilol (COREG) 6.25 MG tablet ? ?2. Which pharmacy/location (including street and city if local pharmacy) is medication to be sent to? [id number] - [place], [address] [address] ? ?3. Do they need a 30 day or 90 day supply? 30 day ? ? ?Patient is completely out of medication. ?

## 2022-04-17 ENCOUNTER — Encounter: Payer: Self-pay | Admitting: Cardiovascular Disease

## 2022-04-17 ENCOUNTER — Ambulatory Visit (INDEPENDENT_AMBULATORY_CARE_PROVIDER_SITE_OTHER): Payer: PRIVATE HEALTH INSURANCE | Admitting: Cardiovascular Disease

## 2022-04-17 VITALS — BP 154/94 | HR 98 | Ht 68.0 in | Wt 250.4 lb

## 2022-04-17 DIAGNOSIS — E785 Hyperlipidemia, unspecified: Secondary | ICD-10-CM

## 2022-04-17 DIAGNOSIS — E1159 Type 2 diabetes mellitus with other circulatory complications: Secondary | ICD-10-CM | POA: Diagnosis not present

## 2022-04-17 DIAGNOSIS — I25118 Atherosclerotic heart disease of native coronary artery with other forms of angina pectoris: Secondary | ICD-10-CM

## 2022-04-17 DIAGNOSIS — Z794 Long term (current) use of insulin: Secondary | ICD-10-CM

## 2022-04-17 DIAGNOSIS — I700 Atherosclerosis of aorta: Secondary | ICD-10-CM

## 2022-04-17 MED ORDER — PROMETHAZINE HCL 25 MG PO TABS: 25.0000 mg | ORAL_TABLET | Freq: Four times a day (QID) | ORAL | 0 refills | Status: DC | PRN

## 2022-04-17 MED ORDER — ALBUTEROL SULFATE HFA 108 (90 BASE) MCG/ACT IN AERS: 1.0000 | INHALATION_SPRAY | Freq: Four times a day (QID) | RESPIRATORY_TRACT | 0 refills | Status: DC | PRN

## 2022-04-17 MED ORDER — ACETAMINOPHEN-CODEINE #3 300-30 MG PO TABS: 1.0000 | ORAL_TABLET | ORAL | 0 refills | Status: DC | PRN
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
## 2022-04-17 NOTE — Patient Instructions (Addendum)
Medication Instructions:  ?TAKE: Phenergan 25 mg every 6 hours as needed for nausea (no refills) ?TAKE:  Tylenol 3 every 4 hours as needed for pain (no refills) ? ?*If you need a refill on your cardiac medications before your next appointment, please call your pharmacy* ? ? ?Lab Work: ?None ordered ?If you have labs (blood work) drawn today and your tests are completely normal, you will receive your results only by: ?MyChart Message (if you have MyChart) OR ?A paper copy in the mail ?If you have any lab test that is abnormal or we need to change your treatment, we will call you to review the results. ? ? ?Testing/Procedures: ?None ordered ? ? ?Follow-Up: ?At [facility], you and your health needs are our priority.  As part of our continuing mission to provide you with exceptional heart care, we have created designated Provider Care Teams.  These Care Teams include your primary Cardiologist (physician) and Advanced Practice Providers (APPs -  Physician Assistants and Nurse Practitioners) who all work together to provide you with the care you need, when you need it. ? ?We recommend signing up for the patient portal called "MyChart".  Sign up information is provided on this After Visit Summary.  MyChart is used to connect with patients for Virtual Visits (Telemedicine).  Patients are able to view lab/test results, encounter notes, upcoming appointments, etc.  Non-urgent messages can be sent to your provider as well.   ?To learn more about what you can do with MyChart, go to [email or web address].   ? ?Your next appointment:   ?12 month(s) ? ?The format for your next appointment:   ?In Person ? ?Provider:   ?Sanda Klein, MD { ? ? ? ?

## 2022-04-18 ENCOUNTER — Ambulatory Visit (INDEPENDENT_AMBULATORY_CARE_PROVIDER_SITE_OTHER): Payer: PRIVATE HEALTH INSURANCE | Admitting: Endocrinology

## 2022-04-18 ENCOUNTER — Encounter: Payer: Self-pay | Admitting: Endocrinology

## 2022-04-18 VITALS — BP 140/90 | HR 99 | Ht 68.0 in | Wt 250.8 lb

## 2022-04-18 DIAGNOSIS — E1159 Type 2 diabetes mellitus with other circulatory complications: Secondary | ICD-10-CM

## 2022-04-18 LAB — POCT GLYCOSYLATED HEMOGLOBIN (HGB A1C): Hemoglobin A1C: 10 % — AB (ref 4.0–5.6)

## 2022-04-18 MED ORDER — GLIPIZIDE 5 MG PO TABS: 5.0000 mg | ORAL_TABLET | Freq: Every day | ORAL | 1 refills | Status: DC

## 2022-04-18 NOTE — Patient Instructions (Addendum)
Please stay off the Trulicity and insulin, and: ?I have sent a prescription to your pharmacy, to add glipizide.   ?Please continue the same metformin.  ?check your blood sugar twice a day.  vary the time of day when you check, between before the 3 meals, and at bedtime.  also check if you have symptoms of your blood sugar being too high or too low.  please keep a record of the readings and bring it to your next appointment here (or you can bring the meter itself).  You can write it on any piece of paper.  please call us sooner if your blood sugar goes below 70, or if you have a lot of readings over 200.   ?You should have an endocrinology follow-up appointment in 3 months.   ?

## 2022-04-18 NOTE — Progress Notes (Signed)
? ?Subjective:  ? ? Patient ID: Jerry Shaffer, male    DOB: [birth year], 61 y.o.   MRN: [id number] ? ?HPI ?Pt returns for f/u of diabetes mellitus: ?DM type: Insulin-requiring type 2 ?Dx'ed: 2013 ?Complications: CAD, PN, and TIA ?Therapy: insulin since 1962, Trulicity, and metformin.   ?DKA: never ?Severe hypoglycemia: never ?Pancreatitis: never ?Pancreatic imaging: normal on 2021 CT ?SDOH: he works [employer], 3rd shift.  ?Other: pt says this was precip by Seroquel; he also took insulin 2016-2017; he declines multiple daily injections; he stopped [place], out of fear of side effects; pt says he cannot use syringe and vial, due to needle phobia.   ?Interval history: pt states he feels well in general.  no cbg record, but states cbg's vary from 117-290.  He stopped [place] 1 week ago, due to cost.  He is unable to afford Trulicity.  He has [employer] ins.   ?Past Medical History:  ?Diagnosis Date  ? Bipolar 1 disorder ([place])   ? Coronary artery disease   ? Diabetes mellitus   ? Dyslipidemia   ? Emphysema   ? Mental disorder   ? BIPOLAR DISORDER  ? Migraine   ? NSTEMI (non-ST elevated myocardial infarction) ([place]) 09/20/2020  ? PONV (postoperative nausea and vomiting)   ? Renal disorder   ? Shortness of breath   ? Stroke [facility])   ? Transient ischemic attack (TIA)   ? ? ?Past Surgical History:  ?Procedure Laterality Date  ? COLONOSCOPY WITH PROPOFOL N/A 10/17/2021  ? Procedure: COLONOSCOPY WITH PROPOFOL;  Surgeon: Milus Banister, MD;  [facility]: [facility];  Service: Endoscopy;  Laterality: N/A;  ? CORONARY ANGIOGRAPHY  09/20/2020  ? CORONARY STENT INTERVENTION  09/20/2020  ? CORONARY STENT INTERVENTION  ? CORONARY STENT INTERVENTION N/A 09/20/2020  ? Procedure: CORONARY STENT INTERVENTION;  Surgeon: Belva Crome, MD;  Location: [place] [facility];  Service: Cardiovascular;  Laterality: N/A;  ? CORONARY STENT INTERVENTION N/A 03/28/2021  ? Procedure: CORONARY STENT INTERVENTION;  Surgeon: [name], MD;  Location: [place] [facility];  Service: Cardiovascular;  Laterality: N/A;  ? HEMORROIDECTOMY    ? KNEE ARTHROSCOPY    ? LEFT HEART CATH AND CORONARY ANGIOGRAPHY N/A 09/20/2020  ? Procedure: LEFT HEART CATH AND CORONARY ANGIOGRAPHY;  Surgeon: Belva Crome, MD;  Location: [place] [facility];  Service: Cardiovascular;  Laterality: N/A;  ? LEFT HEART CATH AND CORONARY ANGIOGRAPHY N/A 03/28/2021  ? Procedure: LEFT HEART CATH AND CORONARY ANGIOGRAPHY;  Surgeon: [name], MD;  Location: [place] [facility];  Service: Cardiovascular;  Laterality: N/A;  ? LUMBAR DISC SURGERY    ? POLYPECTOMY  10/17/2021  ? Procedure: POLYPECTOMY;  Surgeon: Milus Banister, MD;  Location: [facility];  Service: Endoscopy;;  ? TONSILLECTOMY AND ADENOIDECTOMY    ? ? ?Social History  ? ?Socioeconomic History  ? Marital status: Single  ?  Spouse name: Not on file  ? Number of children: Not on file  ? Years of education: Not on file  ? Highest education level: Not on file  ?Occupational History  ? Occupation: Security guard  ?  Employer: [employer]  ?Tobacco Use  ? Smoking status: Former  ?  Types: E-cigarettes  ? Smokeless tobacco: Current  ?Vaping Use  ? Vaping Use: Never used  ?Substance and Sexual Activity  ? Alcohol use: No  ? Drug use: No  ? Sexual activity: Not Currently  ?Other Topics Concern  ? Not on file  ?Social  History Narrative  ? Not on file  ? ?Social Determinants of Health  ? ?Financial Resource Strain: Not on file  ?Food Insecurity: Not on file  ?Transportation Needs: Not on file  ?Physical Activity: Not on file  ?Stress: Not on file  ?Social Connections: Not on file  ?Intimate Partner Violence: Not on file  ? ? ?Current Outpatient Medications on File Prior to Visit  ?Medication Sig Dispense Refill  ? acetaminophen (TYLENOL) 500 MG tablet Take 1,000 mg by mouth every 6 (six) hours as needed for mild pain or headache.     ? acetaminophen-codeine (TYLENOL #3) 300-30 MG tablet Take 1 tablet by mouth every 4 (four) hours as needed  for moderate pain. 30 tablet 0  ? albuterol (VENTOLIN HFA) 108 (90 Base) MCG/ACT inhaler Inhale 1-2 puffs into the lungs every 6 (six) hours as needed for wheezing or shortness of breath. 6.7 g 0  ? aspirin 81 MG chewable tablet Chew 1 tablet (81 mg total) by mouth daily. (Patient taking differently: Chew 81 mg by mouth at bedtime.)    ? Aspirin-Salicylamide-Caffeine (BC HEADACHE POWDER PO) Take 1 packet by mouth daily as needed (for headaches or pain).    ? atorvastatin (LIPITOR) 80 MG tablet TAKE 1 TABLET BY MOUTH EVERY DAY (Patient taking differently: Take 80 mg by mouth in the morning.) 30 tablet 11  ? BD PEN NEEDLE NANO 2ND GEN 32G X 4 MM MISC USE TO INJECT 20 UNITS INSULIN DAILY 200 each 3  ? carvedilol (COREG) 6.25 MG tablet Take 1 tablet (6.25 mg total) by mouth 2 (two) times daily with a meal. 60 tablet 11  ? clopidogrel (PLAVIX) 75 MG tablet Take 1 tablet (75 mg total) by mouth daily with breakfast. (Patient taking differently: Take 75 mg by mouth every evening.) 30 tablet 11  ? Continuous Blood Gluc Sensor (FREESTYLE LIBRE 2 SENSOR) MISC 1 Device by Does not apply route every 14 (fourteen) days. 6 each 3  ? EPINEPHrine 0.3 mg/0.3 mL IJ SOAJ injection Inject 0.3 mg into the muscle as needed for anaphylaxis. 1 each 0  ? Fluticasone-Salmeterol (ADVAIR DISKUS) 250-50 MCG/DOSE AEPB Take 1 puff twice daily.  Use regularly, not a rescue inhaler. (Patient taking differently: Inhale 1 puff into the lungs daily as needed (for flares).) 60 each 0  ? Ibuprofen (ADVIL) 200 MG CAPS Take 400 mg by mouth every 8 (eight) hours as needed (for pain or headaches).    ? lidocaine (LIDODERM) 5 % Place 1 patch onto the skin daily as needed (pain). Remove & Discard patch within 12 hours or as directed by MD    ? metFORMIN (GLUCOPHAGE-XR) 500 MG 24 hr tablet Take 4 tablets (2,000 mg total) by mouth daily. 360 tablet 3  ? nitroGLYCERIN (NITROSTAT) 0.4 MG SL tablet Place 1 tablet (0.4 mg total) under the tongue every 5 (five)  minutes x 3 doses as needed for chest pain. 25 tablet 3  ? promethazine (PHENERGAN) 25 MG tablet Take 1 tablet (25 mg total) by mouth every 6 (six) hours as needed for nausea or vomiting. 30 tablet 0  ? ?No current facility-administered medications on file prior to visit.  ? ? ?Allergies  ?Allergen Reactions  ? Bee Venom Anaphylaxis  ? Penicillins Anaphylaxis  ?  Did it involve swelling of the face/tongue/throat, SOB, or low BP? yes ?Did it involve sudden or severe rash/hives, skin peeling, or any reaction on the inside of your mouth or nose? unknown ?Did you need to seek  medical attention at a hospital or doctor's office? yes ?When did it last happen?      1967 ?If all above answers are "NO", may proceed with cephalosporin use. ?  ? Procaine Anaphylaxis  ? Isosorbide Other (See Comments)  ?  Caused migraines  ? Tape Itching and Other (See Comments)  ?  Certain medical tapes make the patient's skin ITCH  ? ? ?Family History  ?Problem Relation Age of Onset  ? Diabetes type II Other   ? Coronary artery disease Other   ? Bipolar disorder Other   ? Lung cancer Father   ? CAD Mother 42  ? ? ?BP 140/90 (BP Location: Left Arm, Patient Position: Sitting, Cuff Size: Normal)   Pulse 99   Ht '5\' 8"'$  (1.727 m)   Wt 250 lb 12.8 oz (113.8 kg)   SpO2 95%   BMI 38.13 kg/m?  ? ? ?Review of Systems ? ?   ?Objective:  ? Physical Exam ?VITAL SIGNS:  See vs page.   ?GENERAL: no distress.   ? ?Lab Results  ?Component Value Date  ? CREATININE 0.50 (L) 01/27/2022  ? BUN 15 01/27/2022  ? NA 139 01/27/2022  ? K 4.7 01/27/2022  ? CL 104 01/27/2022  ? CO2 24 01/03/2022  ? ? ?   ?Assessment & Plan:  ?type 2 DM: uncontrolled.  ?He declines to change to ozempic. I also advised him to change to a different insulin, but he declines this, also.  ? ?Patient Instructions  ?Please stay off the Trulicity and insulin, and: ?I have sent a prescription to your pharmacy, to add glipizide.   ?Please continue the same metformin.  ?check your blood sugar  twice a day.  vary the time of day when you check, between before the 3 meals, and at bedtime.  also check if you have symptoms of your blood sugar being too high or too low.  please keep a record of the readings and

## 2022-04-20 ENCOUNTER — Encounter: Payer: Self-pay | Admitting: Cardiovascular Disease

## 2022-06-27 ENCOUNTER — Emergency Department (HOSPITAL_COMMUNITY): Payer: Worker's Compensation

## 2022-06-27 ENCOUNTER — Emergency Department (HOSPITAL_COMMUNITY)
Admission: EM | Admit: 2022-06-27 | Discharge: 2022-06-27 | Disposition: A | Discharge status: Home / Self Care | Payer: Worker's Compensation | Attending: Emergency Medicine | Admitting: Emergency Medicine

## 2022-06-27 ENCOUNTER — Encounter (HOSPITAL_COMMUNITY): Payer: Self-pay

## 2022-06-27 ENCOUNTER — Other Ambulatory Visit: Payer: Self-pay

## 2022-06-27 DIAGNOSIS — M25512 Pain in left shoulder: Secondary | ICD-10-CM | POA: Diagnosis present

## 2022-06-27 DIAGNOSIS — Z79899 Other long term (current) drug therapy: Secondary | ICD-10-CM | POA: Insufficient documentation

## 2022-06-27 DIAGNOSIS — Z7902 Long term (current) use of antithrombotics/antiplatelets: Secondary | ICD-10-CM | POA: Diagnosis not present

## 2022-06-27 DIAGNOSIS — Z7984 Long term (current) use of oral hypoglycemic drugs: Secondary | ICD-10-CM | POA: Insufficient documentation

## 2022-06-27 DIAGNOSIS — Z7982 Long term (current) use of aspirin: Secondary | ICD-10-CM | POA: Insufficient documentation

## 2022-06-27 DIAGNOSIS — G8929 Other chronic pain: Secondary | ICD-10-CM | POA: Diagnosis not present

## 2022-06-27 NOTE — Discharge Instructions (Addendum)
Please stop using [place] powders You can continue acetaminophen 500 mg as needed every 4 hours for pain Please call and follow-up with orthopedic surgery
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
## 2022-07-05 ENCOUNTER — Other Ambulatory Visit: Payer: Self-pay | Admitting: Adult Health

## 2022-08-12 ENCOUNTER — Other Ambulatory Visit: Payer: Self-pay | Admitting: Cardiovascular Disease

## 2022-08-26 ENCOUNTER — Ambulatory Visit (INDEPENDENT_AMBULATORY_CARE_PROVIDER_SITE_OTHER): Payer: PRIVATE HEALTH INSURANCE | Admitting: Internal Medicine

## 2022-08-26 ENCOUNTER — Encounter: Payer: Self-pay | Admitting: Internal Medicine

## 2022-08-26 VITALS — BP 140/98 | HR 88 | Ht 68.0 in | Wt 261.0 lb

## 2022-08-26 DIAGNOSIS — E1165 Type 2 diabetes mellitus with hyperglycemia: Secondary | ICD-10-CM

## 2022-08-26 DIAGNOSIS — E785 Hyperlipidemia, unspecified: Secondary | ICD-10-CM | POA: Diagnosis not present

## 2022-08-26 DIAGNOSIS — E1159 Type 2 diabetes mellitus with other circulatory complications: Secondary | ICD-10-CM

## 2022-08-26 DIAGNOSIS — R21 Rash and other nonspecific skin eruption: Secondary | ICD-10-CM

## 2022-08-26 LAB — POCT GLYCOSYLATED HEMOGLOBIN (HGB A1C): Hemoglobin A1C: 9 % — AB (ref 4.0–5.6)

## 2022-08-26 MED ORDER — GLIPIZIDE 5 MG PO TABS
5.0000 mg | ORAL_TABLET | Freq: Every day | ORAL | 3 refills | Status: DC
Start: 2022-08-26 — End: 2022-10-06

## 2022-08-26 MED ORDER — METFORMIN HCL ER 500 MG PO TB24: 2000.0000 mg | ORAL_TABLET | Freq: Every day | ORAL | 3 refills | Status: DC

## 2022-08-26 NOTE — Patient Instructions (Addendum)
Please continue: - Metformin ER 1000 mg 2x daily - Glipizide 5 mg before b'fast  Please add: Please start Ozempic 0.25 mg weekly in a.m. (for example on Sunday morning) x 2 weeks, then increase to 0.5 mg weekly in a.m. if no nausea or hypoglycemia.  Please return in 2-3 months with your sugar log.   PATIENT INSTRUCTIONS FOR TYPE 2 DIABETES:  **Please join MyChart!** - see attached instructions about how to join if you have not done so already.  DIET AND EXERCISE Diet and exercise is an important part of diabetic treatment.  We recommended aerobic exercise in the form of brisk walking (working between 40-60% of maximal aerobic capacity, similar to brisk walking) for 150 minutes per week (such as 30 minutes five days per week) along with 3 times per week performing 'resistance' training (using various gauge rubber tubes with handles) 5-10 exercises involving the major muscle groups (upper body, lower body and core) performing 10-15 repetitions (or near fatigue) each exercise. Start at half the above goal but build slowly to reach the above goals. If limited by weight, joint pain, or disability, we recommend daily walking in a swimming pool with water up to waist to reduce pressure from joints while allow for adequate exercise.    BLOOD GLUCOSES Monitoring your blood glucoses is important for continued management of your diabetes. Please check your blood glucoses 2-4 times a day: fasting, before meals and at bedtime (you can rotate these measurements - e.g. one day check before the 3 meals, the next day check before 2 of the meals and before bedtime, etc.).   HYPOGLYCEMIA (low blood sugar) Hypoglycemia is usually a reaction to not eating, exercising, or taking too much insulin/ other diabetes drugs.  Symptoms include tremors, sweating, hunger, confusion, headache, etc. Treat IMMEDIATELY with 15 grams of Carbs: 4 glucose tablets  cup regular juice/soda 2 tablespoons raisins 4 teaspoons  sugar 1 tablespoon honey Recheck blood glucose in 15 mins and repeat above if still symptomatic/blood glucose <100.  RECOMMENDATIONS TO REDUCE YOUR RISK OF DIABETIC COMPLICATIONS: * Take your prescribed MEDICATION(S) * Follow a DIABETIC diet: Complex carbs, fiber rich foods, (monounsaturated and polyunsaturated) fats * AVOID saturated/trans fats, high fat foods, >2,300 mg salt per day. * EXERCISE at least 5 times a week for 30 minutes or preferably daily.  * DO NOT SMOKE OR DRINK more than 1 drink a day. * Check your FEET every day. Do not wear tightfitting shoes. Contact us if you develop an ulcer * See your EYE doctor once a year or more if needed * Get a FLU shot once a year * Get a PNEUMONIA vaccine once before and once after age 70 years  GOALS:  * Your Hemoglobin A1c of <7%  * fasting sugars need to be <130 * after meals sugars need to be <180 (2h after you start eating) * Your Systolic BP should be 161 or lower  * Your Diastolic BP should be 80 or lower  * Your HDL (Good Cholesterol) should be 40 or higher  * Your LDL (Bad Cholesterol) should be 100 or lower. * Your Triglycerides should be 150 or lower  * Your Urine microalbumin (kidney function) should be <30 * Your Body Mass Index should be 25 or lower    Please consider the following ways to cut down carbs and fat and increase fiber and micronutrients in your diet: - substitute whole grain for white bread or pasta - substitute brown rice for white rice -  substitute 90-calorie flat bread pieces for slices of bread when possible - substitute sweet potatoes or yams for white potatoes - substitute humus for margarine - substitute tofu for cheese when possible - substitute almond or rice milk for regular milk (would not drink soy milk daily due to concern for soy estrogen influence on breast cancer risk) - substitute dark chocolate for other sweets when possible - substitute water - can add lemon or orange slices for taste -  for diet sodas (artificial sweeteners will trick your body that you can eat sweets without getting calories and will lead you to overeating and weight gain in the long run) - do not skip breakfast or other meals (this will slow down the metabolism and will result in more weight gain over time)  - can try smoothies made from fruit and almond/rice milk in am instead of regular breakfast - can also try old-fashioned (not instant) oatmeal made with almond/rice milk in am - order the dressing on the side when eating salad at a restaurant (pour less than half of the dressing on the salad) - eat as little meat as possible - can try juicing, but should not forget that juicing will get rid of the fiber, so would alternate with eating raw veg./fruits or drinking smoothies - use as little oil as possible, even when using olive oil - can dress a salad with a mix of balsamic vinegar and lemon juice, for e.g. - use agave nectar, stevia sugar, or regular sugar rather than artificial sweateners - steam or broil/roast veggies  - snack on veggies/fruit/nuts (unsalted, preferably) when possible, rather than processed foods - reduce or eliminate aspartame in diet (it is in diet sodas, chewing gum, etc) Read the labels!  Try to read Dr. Janene Harvey book: "Program for Reversing Diabetes" for other ideas for healthy eating.

## 2022-08-26 NOTE — Progress Notes (Signed)
Patient ID: Jerry Shaffer, male   DOB: 02/19/1961, 61 y.o.   MRN: [id number]  HPI: Jerry Shaffer is a 61 y.o.-year-old male, returning for follow-up for DM2, dx in 2013, non insulin-dependent (on insulin briefly in 2016, and then again in 2021, but off now), uncontrolled, with complications (CAD, TIA, PN). Pt. previously saw Dr. Loanne Drilling, last visit 4 months ago.  Reviewed HbA1c: Lab Results  Component Value Date   HGBA1C 10.0 (A) 04/18/2022   HGBA1C 7.8 (A) 01/17/2022   HGBA1C 7.3 (A) 10/11/2021   HGBA1C 7.1 (A) 07/12/2021   HGBA1C 6.1 (A) 04/19/2021   HGBA1C 9.4 (A) 01/11/2021   HGBA1C 11.9 (H) 09/20/2020   HGBA1C 7.0 (H) 05/03/2013   HGBA1C 6.5 (H) 07/27/2012   HGBA1C 7.8 (H) 04/18/2012   Pt is on a regimen of: - Metformin ER 1000 mg 2x daily >> occas. Loose stools - Glipizide 5 mg before bedtime He has needle phobia with syringes.  He has no problems injecting insulin with pens. He did not want to try Farxiga due to fear of side effects. He was on Trulicity >> could not obtain it.  He was on [place] >> not affordable.  Pt checks his sugars 2-3x a day and they are: - am: going to bed: 120-200s - 2h after b'fast: n/c - before lunch: n/c - 2h after lunch: n/c - before dinner: waking up: n/c - 30-45 min after dinner(PB or ham sandwich): 130-192, 200s - bedtime: n/c - nighttime: n/c Lowest sugar was 22 (many years ago - on 10 mg Glipizide then); he has hypoglycemia awareness at 70.  Highest sugar was 999 (long time ago).  Glucometer:?  - no CKD, last BUN/creatinine:  Lab Results  Component Value Date   BUN 15 01/27/2022   BUN 14 01/03/2022   CREATININE 0.50 (L) 01/27/2022   CREATININE 0.84 01/03/2022  He is not on ACE inhibitor/ARB.  - He has HL; last set of lipids: Lab Results  Component Value Date   CHOL 87 (L) 08/15/2021   HDL 26 (L) 08/15/2021   LDLCALC 45 08/15/2021   TRIG 72 08/15/2021   CHOLHDL 3.3 08/15/2021  On Lipitor 80 mg daily.  - last eye  exam was "several years ago". No DR reportedly.   - + numbness and tingling in his feet.  Last foot exam 10/11/2021.  He also has a history of bipolar disease, emphysema, migraines. He has left shoulder pain and needs surgery.  ROS: + see HPI No increased urination, blurry vision, chest pain.  Past Medical History:  Diagnosis Date   Bipolar 1 disorder ([place])    Coronary artery disease    Diabetes mellitus    Dyslipidemia    Emphysema    Mental disorder    BIPOLAR DISORDER   Migraine    NSTEMI (non-ST elevated myocardial infarction) ([place]) 09/20/2020   PONV (postoperative nausea and vomiting)    Renal disorder    Shortness of breath    Stroke ([place])    Transient ischemic attack (TIA)    Past Surgical History:  Procedure Laterality Date   COLONOSCOPY WITH PROPOFOL N/A 10/17/2021   Procedure: COLONOSCOPY WITH PROPOFOL;  Surgeon: Milus Banister, MD;  Location: [facility];  Service: Endoscopy;  Laterality: N/A;   CORONARY ANGIOGRAPHY  09/20/2020   CORONARY STENT INTERVENTION  09/20/2020   CORONARY STENT INTERVENTION   CORONARY STENT INTERVENTION N/A 09/20/2020   Procedure: CORONARY STENT INTERVENTION;  Surgeon: Belva Crome, MD;  Location: [facility];  Service: Cardiovascular;  Laterality: N/A;   CORONARY STENT INTERVENTION N/A 03/28/2021   Procedure: CORONARY STENT INTERVENTION;  Surgeon: [name], MD;  Location: [facility];  Service: Cardiovascular;  Laterality: N/A;   HEMORROIDECTOMY     KNEE ARTHROSCOPY     LEFT HEART CATH AND CORONARY ANGIOGRAPHY N/A 09/20/2020   Procedure: LEFT HEART CATH AND CORONARY ANGIOGRAPHY;  Surgeon: Belva Crome, MD;  Location: [facility];  Service: Cardiovascular;  Laterality: N/A;   LEFT HEART CATH AND CORONARY ANGIOGRAPHY N/A 03/28/2021   Procedure: LEFT HEART CATH AND CORONARY ANGIOGRAPHY;  Surgeon: [name], MD;  Location: [facility];  Service: Cardiovascular;  Laterality: N/A;   LUMBAR DISC  SURGERY     POLYPECTOMY  10/17/2021   Procedure: POLYPECTOMY;  Surgeon: Milus Banister, MD;  Location: [facility];  Service: Endoscopy;;   TONSILLECTOMY AND ADENOIDECTOMY     Social History   Socioeconomic History   Marital status: Single    Spouse name: Not on file   Number of children: Not on file   Years of education: Not on file   Highest education level: Not on file  Occupational History   Occupation: Security guard    Employer: [employer]  Tobacco Use   Smoking status: Former    Types: E-cigarettes   Smokeless tobacco: Current  Vaping Use   Vaping Use: Never used  Substance and Sexual Activity   Alcohol use: No   Drug use: No   Sexual activity: Not Currently  Other Topics Concern   Not on file  Social History Narrative   Not on file   Social Determinants of Health   Financial Resource Strain: Not on file  Food Insecurity: Not on file  Transportation Needs: Not on file  Physical Activity: Not on file  Stress: Not on file  Social Connections: Not on file  Intimate Partner Violence: Not on file   Current Outpatient Medications on File Prior to Visit  Medication Sig Dispense Refill   acetaminophen (TYLENOL) 500 MG tablet Take 1,000 mg by mouth every 6 (six) hours as needed for mild pain or headache.      acetaminophen-codeine (TYLENOL #3) 300-30 MG tablet Take 1 tablet by mouth every 4 (four) hours as needed for moderate pain. 30 tablet 0   albuterol (VENTOLIN HFA) 108 (90 Base) MCG/ACT inhaler Inhale 1-2 puffs into the lungs every 6 (six) hours as needed for wheezing or shortness of breath. 6.7 g 0   aspirin 81 MG chewable tablet Chew 1 tablet (81 mg total) by mouth daily. (Patient taking differently: Chew 81 mg by mouth at bedtime.)     Aspirin-Salicylamide-Caffeine (BC HEADACHE POWDER PO) Take 1 packet by mouth daily as needed (for headaches or pain).     atorvastatin (LIPITOR) 80 MG tablet TAKE 1 TABLET BY MOUTH EVERY DAY 30 tablet 11   BD PEN NEEDLE  NANO 2ND GEN 32G X 4 MM MISC USE TO INJECT 20 UNITS INSULIN DAILY 200 each 3   carvedilol (COREG) 6.25 MG tablet Take 1 tablet (6.25 mg total) by mouth 2 (two) times daily with a meal. 60 tablet 11   clopidogrel (PLAVIX) 75 MG tablet TAKE 1 TABLET(75 MG) BY MOUTH DAILY WITH BREAKFAST 30 tablet 11   Continuous Blood Gluc Sensor (FREESTYLE LIBRE 2 SENSOR) MISC 1 Device by Does not apply route every 14 (fourteen) days. 6 each 3   EPINEPHrine 0.3 mg/0.3 mL IJ SOAJ injection Inject  0.3 mg into the muscle as needed for anaphylaxis. 1 each 0   Fluticasone-Salmeterol (ADVAIR DISKUS) 250-50 MCG/DOSE AEPB Take 1 puff twice daily.  Use regularly, not a rescue inhaler. (Patient taking differently: Inhale 1 puff into the lungs daily as needed (for flares).) 60 each 0   glipiZIDE (GLUCOTROL) 5 MG tablet Take 1 tablet (5 mg total) by mouth daily before breakfast. 90 tablet 1   Ibuprofen (ADVIL) 200 MG CAPS Take 400 mg by mouth every 8 (eight) hours as needed (for pain or headaches).     lidocaine (LIDODERM) 5 % Place 1 patch onto the skin daily as needed (pain). Remove & Discard patch within 12 hours or as directed by MD     metFORMIN (GLUCOPHAGE-XR) 500 MG 24 hr tablet Take 4 tablets (2,000 mg total) by mouth daily. 360 tablet 3   nitroGLYCERIN (NITROSTAT) 0.4 MG SL tablet Place 1 tablet (0.4 mg total) under the tongue every 5 (five) minutes x 3 doses as needed for chest pain. 25 tablet 3   promethazine (PHENERGAN) 25 MG tablet Take 1 tablet (25 mg total) by mouth every 6 (six) hours as needed for nausea or vomiting. 30 tablet 0   No current facility-administered medications on file prior to visit.   Allergies  Allergen Reactions   Bee Venom Anaphylaxis   Penicillins Anaphylaxis    Did it involve swelling of the face/tongue/throat, SOB, or low BP? yes Did it involve sudden or severe rash/hives, skin peeling, or any reaction on the inside of your mouth or nose? unknown Did you need to seek medical attention at  a hospital or doctor's office? yes When did it last happen?      1967 If all above answers are "NO", may proceed with cephalosporin use.    Procaine Anaphylaxis   Isosorbide Other (See Comments)    Caused migraines   Tape Itching and Other (See Comments)    Certain medical tapes make the patient's skin ITCH   Family History  Problem Relation Age of Onset   Diabetes type II Other    Coronary artery disease Other    Bipolar disorder Other    Lung cancer Father    CAD Mother 69   PE: BP (!) 140/98 (BP Location: Right Arm, Patient Position: Sitting, Cuff Size: Normal)   Pulse 88   Ht '5\' 8"'$  (1.727 m)   Wt 261 lb (118.4 kg)   SpO2 95%   BMI 39.68 kg/m  Wt Readings from Last 3 Encounters:  08/26/22 261 lb (118.4 kg)  06/27/22 255 lb (115.7 kg)  04/18/22 250 lb 12.8 oz (113.8 kg)   Constitutional: overweight, in NAD Eyes: no exophthalmos ENT: moist mucous membranes, no thyromegaly, no cervical lymphadenopathy Cardiovascular: RRR, No MRG Respiratory: CTA B Musculoskeletal: no deformities Skin: moist, warm, + rash on L foot Neurological: no tremor with outstretched hands  ASSESSMENT: 1. DM2, noninsulin-dependent, but off insulin now, uncontrolled, with complications - CAD, s/p NSTEMI - cerebro-vascular disease, s/p TIA - PN  2. HL  3. Rash  PLAN:  1. Patient with long-standing, uncontrolled diabetes, on oral antidiabetic regimen, with deteriorating control after stopping insulin.  Latest HbA1c obtained at last visit with Dr. Loanne Drilling was 10%, much higher.  Glipizide was added at that time. At today's visit, HbA1c is 9.0%. -At today's visit, we reviewed together his blood sugars at home.  They are at or slightly above target, but he is not checking at consistent times of the day.  He sometimes  checks 30 to 40 minutes after a meal, sometimes at bedtime, also soon after a small meal.  I do not have fasting blood sugars.  However, the blood sugars that he gets at home are not  correlating with his high HbA1c today.  I explained that based on his HbA1c is average blood sugar is ~210.  Mentions that he rarely sees numbers as high at home.  I am afraid that we are missing some high numbers.  It is also possible that his sugars are slowly improving. -He tells me that he is under the fourth line and can barely afford medications.  He did not qualify for patient assistance in the past, reportedly.  However, he also reports that he was not able to obtain Trulicity and [place] due to price before, despite having coupons. -At today's visit he is only on metformin and glipizide.  He takes glipizide incorrectly, at bedtime.  I advised him to move this before his larger meal, which is in the morning.  We also discussed options for improving his blood sugars including adding a weekly GLP-1 receptor agonist patient assistance (Ozempic) or adding long-acting insulin, which is now cheaper (Lantus).  We decided to try Ozempic.  We will start with a low-dose especially as he describes that he had acid reflux with Trulicity in the past.  I gave him a sample pen.  Advised him how to increase the dose depending on symptoms (nausea).  At next visit, we likely will need to increase the dose of Ozempic but, depending on the blood sugars, may also need to long-acting insulin. -We discussed that for shoulder surgery, he most likely needs to have an HbA1c lower than 8%. - I suggested to:  Patient Instructions  Please continue: - Metformin ER 1000 mg 2x daily - Glipizide 5 mg before b'fast  Please add: Please start Ozempic 0.25 mg weekly in a.m. (for example on Sunday morning) x 2 weeks, then increase to 0.5 mg weekly in a.m. if no nausea or hypoglycemia.  Please return in 2-3 months with your sugar log.   - check sugars at different times of the day - check 2-3x a day, rotating checks - discussed about CBG targets for treatment: 80-130 mg/dL before meals and <180 mg/dL after meals; target HbA1c <7%.  appt  - given foot care handout  - he will do foot exam at next visit - given instructions for hypoglycemia management "15-15 rule"  - advised for yearly eye exams - he is overdue.  He mentions that his insurance covers eye exams but he could not find an eye doctor. - Return to clinic in 2-3 mo with sugar log or meter  2. HL - Reviewed latest lipid panel from 08/2021: HDL low, otherwise fractions at goal: Lab Results  Component Value Date   CHOL 87 (L) 08/15/2021   HDL 26 (L) 08/15/2021   LDLCALC 45 08/15/2021   TRIG 72 08/15/2021   CHOLHDL 3.3 08/15/2021  - Continues Lipitor 80 mg daily without side effects. - he is due for another lipid panel - will check this at next OV -hopefully after blood sugars improve more  3. Rash - on L foot -Erythematous discoloration on the medial aspect of the left foot -Discussed about possible etiologies: Eczema, fungal infection -I did advise him to try an over-the-counter antifungal cream twice a day for 2 weeks.  If this is not working, this could be eczema.  He may need to see dermatology.  - Total time  spent for the visit:40 min, in precharting, reviewing Dr. Cordelia Pen last note, obtaining medical information from the chart and from the pt, reviewing his  previous labs, evaluations, and treatments, reviewing his symptoms, counseling him about his diabetes and the other 2 conditions (please see the discussed topics above), and developing a plan to further treat them; he had a number of questions which I addressed.  Philemon Kingdom, MD PhD [facility] Endocrinology

## 2022-09-05 ENCOUNTER — Other Ambulatory Visit: Payer: Self-pay

## 2022-09-05 ENCOUNTER — Encounter (HOSPITAL_COMMUNITY): Payer: Self-pay | Admitting: Emergency Medicine

## 2022-09-05 ENCOUNTER — Emergency Department (HOSPITAL_COMMUNITY): Payer: 59

## 2022-09-05 ENCOUNTER — Other Ambulatory Visit: Payer: Self-pay | Admitting: *Deleted

## 2022-09-05 ENCOUNTER — Emergency Department (EMERGENCY_DEPARTMENT_HOSPITAL)
Admission: EM | Admit: 2022-09-05 | Discharge: 2022-09-05 | Disposition: A | Discharge status: Home / Self Care | Payer: 59 | Source: Home / Self Care | Attending: Emergency Medicine | Admitting: Emergency Medicine

## 2022-09-05 DIAGNOSIS — G4733 Obstructive sleep apnea (adult) (pediatric): Secondary | ICD-10-CM | POA: Diagnosis not present

## 2022-09-05 DIAGNOSIS — Z955 Presence of coronary angioplasty implant and graft: Secondary | ICD-10-CM | POA: Insufficient documentation

## 2022-09-05 DIAGNOSIS — I483 Typical atrial flutter: Secondary | ICD-10-CM | POA: Diagnosis not present

## 2022-09-05 DIAGNOSIS — R778 Other specified abnormalities of plasma proteins: Secondary | ICD-10-CM

## 2022-09-05 DIAGNOSIS — I219 Acute myocardial infarction, unspecified: Secondary | ICD-10-CM | POA: Diagnosis not present

## 2022-09-05 DIAGNOSIS — I4892 Unspecified atrial flutter: Secondary | ICD-10-CM

## 2022-09-05 DIAGNOSIS — Z7982 Long term (current) use of aspirin: Secondary | ICD-10-CM | POA: Insufficient documentation

## 2022-09-05 DIAGNOSIS — E119 Type 2 diabetes mellitus without complications: Secondary | ICD-10-CM | POA: Insufficient documentation

## 2022-09-05 DIAGNOSIS — J439 Emphysema, unspecified: Secondary | ICD-10-CM | POA: Insufficient documentation

## 2022-09-05 DIAGNOSIS — Z7902 Long term (current) use of antithrombotics/antiplatelets: Secondary | ICD-10-CM | POA: Insufficient documentation

## 2022-09-05 DIAGNOSIS — I2510 Atherosclerotic heart disease of native coronary artery without angina pectoris: Secondary | ICD-10-CM | POA: Insufficient documentation

## 2022-09-05 DIAGNOSIS — Z87891 Personal history of nicotine dependence: Secondary | ICD-10-CM | POA: Insufficient documentation

## 2022-09-05 DIAGNOSIS — I25118 Atherosclerotic heart disease of native coronary artery with other forms of angina pectoris: Secondary | ICD-10-CM

## 2022-09-05 LAB — COMPREHENSIVE METABOLIC PANEL
ALT: 22 U/L (ref 0–44)
AST: 23 U/L (ref 15–41)
Albumin: 3.8 g/dL (ref 3.5–5.0)
Alkaline Phosphatase: 98 U/L (ref 38–126)
Anion gap: 7 (ref 5–15)
BUN: 15 mg/dL (ref 6–20)
CO2: 23 mmol/L (ref 22–32)
Calcium: 8.7 mg/dL — ABNORMAL LOW (ref 8.9–10.3)
Chloride: 107 mmol/L (ref 98–111)
Creatinine, Ser: 0.72 mg/dL (ref 0.61–1.24)
GFR, Estimated: >60 mL/min (ref >60)
Glucose, Bld: 307 mg/dL — ABNORMAL HIGH (ref 70–99)
Potassium: 4.1 mmol/L (ref 3.5–5.1)
Sodium: 137 mmol/L (ref 135–145)
Total Bilirubin: 0.6 mg/dL (ref 0.3–1.2)
Total Protein: 6.1 g/dL — ABNORMAL LOW (ref 6.5–8.1)

## 2022-09-05 LAB — CBC WITH DIFFERENTIAL/PLATELET
Abs Immature Granulocytes: 0.04 10*3/uL (ref 0.00–0.07)
Basophils Absolute: 0.1 10*3/uL (ref 0.0–0.1)
Basophils Relative: 1 %
Eosinophils Absolute: 0.2 10*3/uL (ref 0.0–0.5)
Eosinophils Relative: 2 %
HCT: 43.6 % (ref 39.0–52.0)
Hemoglobin: 14.3 g/dL (ref 13.0–17.0)
Immature Granulocytes: 0 %
Lymphocytes Relative: 12 %
Lymphs Abs: 1.3 10*3/uL (ref 0.7–4.0)
MCH: 28.3 pg (ref 26.0–34.0)
MCHC: 32.8 g/dL (ref 30.0–36.0)
MCV: 86.3 fL (ref 80.0–100.0)
Monocytes Absolute: 0.5 10*3/uL (ref 0.1–1.0)
Monocytes Relative: 4 %
Neutro Abs: 8.6 10*3/uL — ABNORMAL HIGH (ref 1.7–7.7)
Neutrophils Relative %: 81 %
Platelets: 223 10*3/uL (ref 150–400)
RBC: 5.05 MIL/uL (ref 4.22–5.81)
RDW: 13.4 % (ref 11.5–15.5)
WBC: 10.6 10*3/uL — ABNORMAL HIGH (ref 4.0–10.5)
nRBC: 0 % (ref 0.0–0.2)

## 2022-09-05 LAB — TROPONIN I (HIGH SENSITIVITY)
Troponin I (High Sensitivity): 665 ng/L (ref <18)
Troponin I (High Sensitivity): 2141 ng/L (ref <18)

## 2022-09-05 LAB — BRAIN NATRIURETIC PEPTIDE: B Natriuretic Peptide: 101.3 pg/mL — ABNORMAL HIGH (ref 0.0–100.0)

## 2022-09-05 LAB — MAGNESIUM: Magnesium: 1.5 mg/dL — ABNORMAL LOW (ref 1.7–2.4)

## 2022-09-05 MED ORDER — APIXABAN 5 MG PO TABS: 5.0000 mg | ORAL_TABLET | Freq: Two times a day (BID) | ORAL | 3 refills | Status: DC

## 2022-09-05 MED ORDER — DILTIAZEM HCL-DEXTROSE 125-5 MG/125ML-% IV SOLN (PREMIX)
5.0000 mg/h | INTRAVENOUS | Status: DC
  Administered 2022-09-05: 5 mg/h via INTRAVENOUS
  Filled 2022-09-05: qty 125

## 2022-09-05 MED ORDER — DILTIAZEM LOAD VIA INFUSION
20.0000 mg | Freq: Once | INTRAVENOUS | Status: AC
  Administered 2022-09-05: 20 mg via INTRAVENOUS
  Filled 2022-09-05: qty 20

## 2022-09-05 MED ORDER — MAGNESIUM SULFATE 2 GM/50ML IV SOLN
2.0000 g | Freq: Once | INTRAVENOUS | Status: AC
  Administered 2022-09-05: 2 g via INTRAVENOUS
  Filled 2022-09-05: qty 50

## 2022-09-05 MED ORDER — APIXABAN 5 MG PO TABS
5.0000 mg | ORAL_TABLET | Freq: Two times a day (BID) | ORAL | Status: DC
Start: 2022-09-05 — End: 2022-09-05

## 2022-09-05 MED ORDER — ETOMIDATE 2 MG/ML IV SOLN
10.0000 mg | Freq: Once | INTRAVENOUS | Status: AC
  Administered 2022-09-05: 10 mg via INTRAVENOUS
  Filled 2022-09-05: qty 10

## 2022-09-05 MED ORDER — HEPARIN BOLUS VIA INFUSION
5700.0000 [IU] | Freq: Once | INTRAVENOUS | Status: AC
  Administered 2022-09-05: 5700 [IU] via INTRAVENOUS
  Filled 2022-09-05: qty 5700

## 2022-09-05 MED ORDER — ASPIRIN 81 MG PO CHEW
162.0000 mg | CHEWABLE_TABLET | Freq: Once | ORAL | Status: AC
  Administered 2022-09-05: 162 mg via ORAL
  Filled 2022-09-05: qty 2

## 2022-09-05 MED ORDER — APIXABAN 5 MG PO TABS: 5.0000 mg | ORAL_TABLET | Freq: Two times a day (BID) | ORAL | Status: DC

## 2022-09-05 MED ORDER — HEPARIN (PORCINE) 25000 UT/250ML-% IV SOLN
1400.0000 [IU]/h | INTRAVENOUS | Status: DC
  Administered 2022-09-05: 1400 [IU]/h via INTRAVENOUS
  Filled 2022-09-05: qty 250

## 2022-09-05 MED ORDER — LACTATED RINGERS IV BOLUS
1000.0000 mL | Freq: Once | INTRAVENOUS | Status: AC
  Administered 2022-09-05: 1000 mL via INTRAVENOUS

## 2022-09-05 MED ORDER — ONDANSETRON HCL 4 MG/2ML IJ SOLN
4.0000 mg | Freq: Once | INTRAMUSCULAR | Status: AC
  Administered 2022-09-05: 4 mg via INTRAVENOUS
  Filled 2022-09-05: qty 2

## 2022-09-05 MED ORDER — DILTIAZEM HCL ER COATED BEADS 180 MG PO CP24
180.0000 mg | ORAL_CAPSULE | Freq: Every day | ORAL | Status: DC
  Filled 2022-09-05: qty 1

## 2022-09-05 MED ORDER — IOHEXOL 350 MG/ML SOLN
80.0000 mL | Freq: Once | INTRAVENOUS | Status: AC | PRN
  Administered 2022-09-05: 80 mL via INTRAVENOUS

## 2022-09-05 MED ORDER — ETOMIDATE 2 MG/ML IV SOLN
INTRAVENOUS | Status: AC | PRN
  Administered 2022-09-05: 10 mg via INTRAVENOUS

## 2022-09-05 MED ORDER — SODIUM CHLORIDE 0.9 % IV SOLN
12.5000 mg | Freq: Four times a day (QID) | INTRAVENOUS | Status: DC | PRN
  Administered 2022-09-05: 12.5 mg via INTRAVENOUS
  Filled 2022-09-05: qty 12.5

## 2022-09-05 MED ORDER — DILTIAZEM HCL 60 MG PO TABS
60.0000 mg | ORAL_TABLET | Freq: Once | ORAL | Status: AC
  Administered 2022-09-05: 60 mg via ORAL
  Filled 2022-09-05: qty 1

## 2022-09-05 MED ORDER — CARVEDILOL 12.5 MG PO TABS: 12.5000 mg | ORAL_TABLET | Freq: Two times a day (BID) | ORAL | 3 refills | Status: DC

## 2022-09-05 MED ORDER — FENTANYL CITRATE PF 50 MCG/ML IJ SOSY
100.0000 ug | PREFILLED_SYRINGE | Freq: Once | INTRAMUSCULAR | Status: AC
  Administered 2022-09-05: 100 ug via INTRAVENOUS
  Filled 2022-09-05: qty 2

## 2022-09-05 NOTE — Consult Note (Addendum)
Cardiology Consultation   Patient ID: Jerry Shaffer MRN: [id number]; DOB: 1961-05-07  Admit date: 09/05/2022 Date of Consult: 09/05/2022  PCP:  Patient, No Pcp Per   [facility] Providers Cardiologist:  Thurmon Fair, MD        Patient Profile:   Jerry Shaffer is a 61 y.o. male with a hx of CAD S/P PCI in 10/21 (mid PDA 100% stenosed at the time) and in 4/22 (prox LAD) who is being seen 09/05/2022 for the evaluation of chest pain at the request of emergency department.  History of Present Illness:   [name] is a 61 year old male who was at work last night when chest pain started. He states it felt like someone punched him in the chest. He states the pain was non-radiating, however due to his shoulder injury he cannot tell if pain is from his chest or from his shoulder. Pt was diaphoretic as well during the episode. He called EMS, where he was found to be in Aflutter with heart rate in the 150 range that persisted despite adenosine push twice. Cardizem was pushed x2 and he was found to be in atrial fibrillation.He states this feels different then previous MI's because the pain is localized to the center of his chest. He quit smoking one year ago, and vaping as well.    Past Medical History:  Diagnosis Date   Bipolar 1 disorder (HCC)    Coronary artery disease    Diabetes mellitus    Dyslipidemia    Emphysema    Mental disorder    BIPOLAR DISORDER   Migraine    NSTEMI (non-ST elevated myocardial infarction) (HCC) 09/20/2020   PONV (postoperative nausea and vomiting)    Renal disorder    Shortness of breath    Stroke (HCC)    Transient ischemic attack (TIA)     Past Surgical History:  Procedure Laterality Date   COLONOSCOPY WITH PROPOFOL N/A 10/17/2021   Procedure: COLONOSCOPY WITH PROPOFOL;  Surgeon: Rachael Fee, MD;  Location: [facility];  Service: Endoscopy;  Laterality: N/A;   CORONARY ANGIOGRAPHY  09/20/2020   CORONARY STENT INTERVENTION   09/20/2020   CORONARY STENT INTERVENTION   CORONARY STENT INTERVENTION N/A 09/20/2020   Procedure: CORONARY STENT INTERVENTION;  Surgeon: Lyn Records, MD;  Location: [facility] INVASIVE CV LAB;  Service: Cardiovascular;  Laterality: N/A;   CORONARY STENT INTERVENTION N/A 03/28/2021   Procedure: CORONARY STENT INTERVENTION;  Surgeon: Tonny Bollman, MD;  Location: [facility] INVASIVE CV LAB;  Service: Cardiovascular;  Laterality: N/A;   HEMORROIDECTOMY     KNEE ARTHROSCOPY     LEFT HEART CATH AND CORONARY ANGIOGRAPHY N/A 09/20/2020   Procedure: LEFT HEART CATH AND CORONARY ANGIOGRAPHY;  Surgeon: Lyn Records, MD;  Location: [facility] INVASIVE CV LAB;  Service: Cardiovascular;  Laterality: N/A;   LEFT HEART CATH AND CORONARY ANGIOGRAPHY N/A 03/28/2021   Procedure: LEFT HEART CATH AND CORONARY ANGIOGRAPHY;  Surgeon: Tonny Bollman, MD;  Location: [facility] INVASIVE CV LAB;  Service: Cardiovascular;  Laterality: N/A;   LUMBAR DISC SURGERY     POLYPECTOMY  10/17/2021   Procedure: POLYPECTOMY;  Surgeon: Rachael Fee, MD;  Location: [facility];  Service: Endoscopy;;   TONSILLECTOMY AND ADENOIDECTOMY         Inpatient Medications: Scheduled Meds:  diltiazem  60 mg Oral Once   Continuous Infusions:  diltiazem (CARDIZEM) infusion 5 mg/hr (09/05/22 0732)   heparin 1,400 Units/hr (09/05/22 0854)   magnesium sulfate bolus IVPB  2 g (09/05/22 3235)   promethazine (PHENERGAN) injection (IM or IVPB) Stopped (09/05/22 0719)   PRN Meds: promethazine (PHENERGAN) injection (IM or IVPB)  Allergies:    Allergies  Allergen Reactions   Bee Venom Anaphylaxis   Penicillins Anaphylaxis    Did it involve swelling of the face/tongue/throat, SOB, or low BP? yes Did it involve sudden or severe rash/hives, skin peeling, or any reaction on the inside of your mouth or nose? unknown Did you need to seek medical attention at a hospital or doctor's office? yes When did it last happen?      1967 If all above answers are "NO", may  proceed with cephalosporin use.    Procaine Anaphylaxis   Isosorbide Other (See Comments)    Caused migraines   Tape Itching and Other (See Comments)    Certain medical tapes make the patient's skin ITCH    Social History:   Social History   Socioeconomic History   Marital status: Single    Spouse name: Not on file   Number of children: Not on file   Years of education: Not on file   Highest education level: Not on file  Occupational History   Occupation: Security guard    Employer: [employer]  Tobacco Use   Smoking status: Former    Types: E-cigarettes   Smokeless tobacco: Current  Vaping Use   Vaping Use: Never used  Substance and Sexual Activity   Alcohol use: No   Drug use: No   Sexual activity: Not Currently  Other Topics Concern   Not on file  Social History Narrative   Not on file   Social Determinants of Health   Financial Resource Strain: Not on file  Food Insecurity: Not on file  Transportation Needs: Not on file  Physical Activity: Not on file  Stress: Not on file  Social Connections: Not on file  Intimate Partner Violence: Not on file    Family History:    Family History  Problem Relation Age of Onset   Diabetes type II Other    Coronary artery disease Other    Bipolar disorder Other    Lung cancer Father    CAD Mother 31     ROS:  Please see the history of present illness.   All other ROS reviewed and negative.     Physical Exam/Data:   Vitals:   09/05/22 0830 09/05/22 0856 09/05/22 0900 09/05/22 0930  BP: 119/82  139/85 134/83  Pulse: 79  73 72  Resp: $Remo'15  15 16  'BVtRg$ Temp:  (!) 97.5 F (36.4 C)    TempSrc:  Oral    SpO2: 92%  98% 96%  Weight:      Height:        Intake/Output Summary (Last 24 hours) at 09/05/2022 1010 Last data filed at 09/05/2022 0719 Gross per 24 hour  Intake 765.9 ml  Output --  Net 765.9 ml      09/05/2022    7:36 AM 08/26/2022    8:39 AM 06/27/2022    9:31 AM  Last 3 Weights  Weight (lbs) 258 lb  261 lb 255 lb  Weight (kg) 117.028 kg 118.389 kg 115.667 kg     Body mass index is 39.23 kg/m.  General:  Obese uncomfortable gentleman, in no acute distress HEENT: normal Neck: no JVD Vascular: No carotid bruits; Distal pulses 2+ bilaterally Cardiac:  normal S1, S2; RRR; no murmur  Lungs:  clear to auscultation bilaterally, no wheezing, rhonchi  or rales  Abd: soft, nontender, no hepatomegaly  Ext: no edema Musculoskeletal:  No deformities, BUE and BLE strength normal and equal Skin: warm and dry  Neuro:  CNs 2-12 intact, no focal abnormalities noted Psych:  Normal affect   EKG:  The EKG was personally reviewed and demonstrates:  Sinus rhythm Telemetry:  Telemetry was personally reviewed and demonstrates:  Sinus rhythm  Relevant CV Studies: ECHO 09/20/2020  1. Left ventricular ejection fraction, by estimation, is 55 to 60%. The  left ventricle has normal function. The left ventricle has no regional  wall motion abnormalities. There is severe left ventricular hypertrophy.  Left ventricular diastolic parameters   are consistent with Grade I diastolic dysfunction (impaired relaxation).   2. Right ventricular systolic function is normal. The right ventricular  size is normal. Tricuspid regurgitation signal is inadequate for assessing  PA pressure.   3. The mitral valve is normal in structure. No evidence of mitral valve  regurgitation. No evidence of mitral stenosis.   4. The aortic valve is tricuspid. Aortic valve regurgitation is not  visualized. No aortic stenosis is present.   5. Aortic dilatation noted. There is mild dilatation of the aortic root,  measuring 38 mm.   6. The inferior vena cava is normal in size with greater than 50%  respiratory variability, suggesting right atrial pressure of 3 mmHg.    Cath 09/20/2020 Total occlusion of the mid PDA (a large vessel that extends to the left ventricular apex), filling sluggishly by apical LAD to PDA collaterals.  This vessel  is felt to represent the culprit for the patient's presentation. Total occlusion of the mid PDA reduced to 0% using an 18 x 2.75 mm Onyx deployed at 12 atm x 2 inflations.  TIMI grade III flow was noted.  Resolution of chest pain occurred.  Proximal to the stented segment there is a 30 to 40% stenosis in the PDA. Left main is widely patent LAD contains mid vessel disease with a Medina 011 bifurcation stenosis.  LAD is 70% and diagonal is 40%.  This can be managed medically. Circumflex contains segmental 40 to 50% stenosis in the large second obtuse marginal. RCA is dominant, tortuous, and no significant disease other than that noted above in the PDA. Mid inferior wall to apical akinesis.  LVEDP 6 mmHg.  EF 45 to 50%.   Cath 03/28/21 1.  Patent left main 2.  Severe stenosis of the proximal LAD, new from cardiac catheterization 6 months ago, treated successfully with PCI using a 3.5 x 20 mm Synergy DES.  The mid LAD is unchanged from the prior study with Medina 0, 1, 1 bifurcation disease with only moderate stenosis in the LAD and mild stenosis in the diagonal 3.  Moderate 50% mid circumflex stenosis 4.  Continued patency of the right PDA stent with mild diffuse nonobstructive PDA and RCA stenosis 5.  Normal LVEDP  Laboratory Data:  High Sensitivity Troponin:   Recent Labs  Lab 09/05/22 0728  TROPONINIHS 665*     Chemistry Recent Labs  Lab 09/05/22 0728  NA 137  K 4.1  CL 107  CO2 23  GLUCOSE 307*  BUN 15  CREATININE 0.72  CALCIUM 8.7*  MG 1.5*  GFRNONAA >60  ANIONGAP 7    Recent Labs  Lab 09/05/22 0728  PROT 6.1*  ALBUMIN 3.8  AST 23  ALT 22  ALKPHOS 98  BILITOT 0.6   Lipids No results for input(s): "CHOL", "TRIG", "HDL", "LABVLDL", "LDLCALC", "CHOLHDL" in  the last 168 hours.  Hematology Recent Labs  Lab 09/05/22 0728  WBC 10.6*  RBC 5.05  HGB 14.3  HCT 43.6  MCV 86.3  MCH 28.3  MCHC 32.8  RDW 13.4  PLT 223   Thyroid No results for input(s): "TSH",  "FREET4" in the last 168 hours.  BNP Recent Labs  Lab 09/05/22 0728  BNP 101.3*    DDimer No results for input(s): "DDIMER" in the last 168 hours.   Radiology/Studies:  DG Chest Portable 1 View  Result Date: 09/05/2022 CLINICAL DATA:  Atrial fibrillation with rapid ventricular response. EXAM: PORTABLE CHEST 1 VIEW COMPARISON:  PA single view 01/03/2022 and chest CT 01/27/2022 FINDINGS: There is an electrical pad superimposing over the left upper to mid lung region and overlying monitor wires. The heart silhouette is enlarged and projects larger than previously. The central vessels are prominent but no overt edema is seen. There is a stable mediastinum with mild aortic tortuosity and atherosclerosis. Lungs are hypoinflated but generally clear, although with limited view of the lung bases. Thoracic spondylosis. IMPRESSION: 1. Cardiomegaly with central vascular prominence without overt edema. 2. Hypoinflated chest with no acute radiographic findings with limited view of the bases. Electronically Signed   By: Telford Nab M.D.   On: 09/05/2022 07:32     Assessment and Plan:   #Demand Infarction Secondary to Atrial Flutter Patient is S/P PCI x2 (as noted above), has a 25-30 pack year smoking history, dyslipidemic, and a history of T2DM. He does not exercise regularly and diet is not well controlled. He states he is compliant with his medications. Pt most likely had demand infarction secondary to atrial flutter. He has been in sinus rhythm post cardioversion in the emergency department and is currently not in any chest pain since cardioversion. He is currently stable. Pt is obese, and has gained weight since last visit, and states that he snores very loudly, so I suspect chronic sleep apnea could play a role in development of abnormal rhythm. Elevated troponin level most likely secondary to cardioversion and demand ischemia   Recommendations: - Outpatient ablation with EP  - Outpatient nuclear  stress test  - Outpatient Sleep Study  - Discontinue aspirin, start eliquis 5 and continue plavix $RemoveBeforeD'75mg'vwynLVlmBMqrjm$  - Increase Coreg to $Remove'25mg'sobrCok$    Risk Assessment/Risk Scores:         For questions or updates, please contact [place] Please consult [email or web address] for contact info under    Signed, Drucie Opitz, MD  09/05/2022 10:10 AM   I have seen and examined the patient along with Drucie Opitz, MD .  I have reviewed the chart, notes and new data.  I agree with PA/NP's note.  Key new complaints: He had severe anginal chest pain during atrial flutter with rapid ventricular response, but following resolution of arrhythmia no longer has any chest discomfort.  He reports having similar rapid palpitations in the past, but these only lasted for a few minutes and did not require medical attention.  He has gained weight.  He is a heavy snorer.  He has known obstructive sleep apnea but has not been using CPAP in a long time after hearing about the device recalls.  He does not have exertional chest pain. Key examination changes: Obese, short neck with crowded oropharynx, normal cardiovascular exam.  Lying fully supine in bed without complaints of shortness of breath.  No evidence of hypervolemia on exam. Key new findings / data: ECG shows typical counterclockwise right atrial flutter.  Post cardioversion ECG shows sinus rhythm with occasional PACs, but no ischemic ST-T-segment changes.  He has remained in sinus rhythm on telemetry since cardioversion roughly 6 hours ago.  As expected, high-sensitivity troponin is elevated but to a relatively small degree, consistent with demand myocardial infarction.  In addition to his previous areas of revascularization he does have moderate stenoses in the diagonal artery and left circumflex coronary artery, territories of potential ischemia during rapid arrhythmia  PLAN: I think he had demand myocardial infarction due to rapid arrhythmia. I doubt that this is a  true atherothrombotic coronary syndrome. He is currently asymptomatic. He will be best served by early radiofrequency ablation for the atrial flutter.  Due to his coronary disease he is not a good candidate for class I antiarrhythmics and at his young age I would avoid amiodarone. In the meantime, we will double his dose of beta-blockers and start anticoagulation with Eliquis.  Stop aspirin.  Continue clopidogrel. Also make arrangements for a nuclear perfusion study to make sure he does not have new significant areas of myocardial ischemia. On the more long-term perspective, it is highly likely that his untreated sleep apnea is the source of his atrial arrhythmia.  If this is not addressed it is quite likely he will come back with atrial fibrillation and other complications such as congestive heart failure and pulmonary hypertension.  It is critical to resume treatment for sleep apnea.  We will start with a outpatient home sleep study. Strongly encouraged weight loss.  Sanda Klein, MD, [place] [phone] 09/05/2022, 11:42 AM

## 2022-09-05 NOTE — ED Notes (Signed)
Patient transported to CT 

## 2022-09-05 NOTE — ED Provider Notes (Signed)
[facility] EMERGENCY DEPARTMENT Provider Note   CSN: [id number] Arrival date & time: 09/05/22  4010     History  Chief Complaint  Patient presents with  . Shortness of Breath    Jerry Shaffer is a 61 y.o. male.  61 year old male that presents with EMS secondary to chest pain shortness of breath.  Patient states that he was doing his job tonight and all of a sudden had a cute onset of a chest pressure like someone punched him in the chest.  He started to get sweaty and nauseous and lightheaded.  He called EMS.  With them he was with a rapid rate.  They tried adenosine which slowed her down enough to look like atrial flutter (I reviewed the rhythm strips from EMS and it does appear that he was in a flutter) so then they tried 2 different diltiazem pushes and patient did not convert brought here for further evaluation.  Patient states he had one episode this before that was not sustained.  Otherwise he does not have any previous history of this.   Shortness of Breath      Home Medications Prior to Admission medications   Medication Sig Start Date End Date Taking? Authorizing Provider  acetaminophen (TYLENOL) 500 MG tablet Take 1,000 mg by mouth every 6 (six) hours as needed for mild pain or headache.     [provider]  acetaminophen-codeine (TYLENOL #3) 300-30 MG tablet Take 1 tablet by mouth every 4 (four) hours as needed for moderate pain. 04/17/22   Croitoru, Mihai, MD  albuterol (VENTOLIN HFA) 108 (90 Base) MCG/ACT inhaler Inhale 1-2 puffs into the lungs every 6 (six) hours as needed for wheezing or shortness of breath. 04/17/22   Croitoru, Mihai, MD  aspirin 81 MG chewable tablet Chew 1 tablet (81 mg total) by mouth daily. Patient taking differently: Chew 81 mg by mouth at bedtime. 09/22/20   Barrett, Evelene Croon, PA-C  Aspirin-Salicylamide-Caffeine (BC HEADACHE POWDER PO) Take 1 packet by mouth daily as needed (for headaches or pain).    [provider]  atorvastatin (LIPITOR) 80 MG tablet TAKE 1 TABLET BY MOUTH EVERY DAY 07/07/22   Lendon Colonel, NP  BD PEN NEEDLE NANO 2ND GEN 32G X 4 MM MISC USE TO INJECT 20 UNITS INSULIN DAILY 07/26/21   Renato Shin, MD  carvedilol (COREG) 6.25 MG tablet Take 1 tablet (6.25 mg total) by mouth 2 (two) times daily with a meal. 03/26/22   Croitoru, Mihai, MD  clopidogrel (PLAVIX) 75 MG tablet TAKE 1 TABLET(75 MG) BY MOUTH DAILY WITH BREAKFAST 08/13/22   Croitoru, Mihai, MD  EPINEPHrine 0.3 mg/0.3 mL IJ SOAJ injection Inject 0.3 mg into the muscle as needed for anaphylaxis. 01/02/21   Renato Shin, MD  Fluticasone-Salmeterol (ADVAIR DISKUS) 250-50 MCG/DOSE AEPB Take 1 puff twice daily.  Use regularly, not a rescue inhaler. Patient taking differently: Inhale 1 puff into the lungs daily as needed (for flares). 02/21/19   Molpus, John, MD  glipiZIDE (GLUCOTROL) 5 MG tablet Take 1 tablet (5 mg total) by mouth daily before breakfast. 08/26/22   Philemon Kingdom, MD  Ibuprofen (ADVIL) 200 MG CAPS Take 400 mg by mouth every 8 (eight) hours as needed (for pain or headaches).    [provider]  lidocaine (LIDODERM) 5 % Place 1 patch onto the skin daily as needed (pain). Remove & Discard patch within 12 hours or as directed by MD    [provider]  metFORMIN (GLUCOPHAGE-XR) 500 MG 24 hr tablet Take 4 tablets (2,000 mg total) by mouth daily. 08/26/22   Philemon Kingdom, MD  nitroGLYCERIN (NITROSTAT) 0.4 MG SL tablet Place 1 tablet (0.4 mg total) under the tongue every 5 (five) minutes x 3 doses as needed for chest pain. 03/12/21   Croitoru, Mihai, MD  promethazine (PHENERGAN) 25 MG tablet Take 1 tablet (25 mg total) by mouth every 6 (six) hours as needed for nausea or vomiting. 04/17/22   Croitoru, Mihai, MD      Allergies    Bee venom, Penicillins, Procaine, Isosorbide, and Tape    Review of Systems   Review of Systems  Respiratory:  Positive for shortness of breath.     Physical  Exam Updated Vital Signs BP (!) 143/100   Pulse 82   Temp 97.9 F (36.6 C) (Oral)   Resp (!) 21   SpO2 98%  Physical Exam Vitals and nursing note reviewed.  Constitutional:      Appearance: He is well-developed.  HENT:     Head: Normocephalic and atraumatic.  Cardiovascular:     Rate and Rhythm: Regular rhythm. Tachycardia present.  Pulmonary:     Effort: Pulmonary effort is normal. Tachypnea present. No respiratory distress.     Breath sounds: No decreased breath sounds, wheezing or rhonchi.  Abdominal:     General: There is no distension.  Musculoskeletal:        General: Normal range of motion.     Cervical back: Normal range of motion.  Skin:    General: Skin is warm and moist.     Comments: diaphoretic  Neurological:     General: No focal deficit present.     Mental Status: He is alert.     ED Results / Procedures / Treatments   Labs (all labs ordered are listed, but only abnormal results are displayed) Labs Reviewed  CBC WITH DIFFERENTIAL/PLATELET  COMPREHENSIVE METABOLIC PANEL  MAGNESIUM  BRAIN NATRIURETIC PEPTIDE  TROPONIN I (HIGH SENSITIVITY)    EKG None  Radiology No results found.  Procedures .Critical Care  Performed by: Merrily Pew, MD Authorized by: Merrily Pew, MD   Critical care provider statement:    Critical care time (minutes):  30   Critical care was necessary to treat or prevent imminent or life-threatening deterioration of the following conditions:  Cardiac failure and circulatory failure   Critical care was time spent personally by me on the following activities:  Development of treatment plan with patient or surrogate, discussions with consultants, evaluation of patient's response to treatment, examination of patient, ordering and review of laboratory studies, ordering and review of radiographic studies, ordering and performing treatments and interventions, pulse oximetry, re-evaluation of patient's condition and review of old  charts .1-3 Lead EKG Interpretation  Performed by: Merrily Pew, MD Authorized by: Merrily Pew, MD     Interpretation: normal     ECG rate:  92   Rhythm: atrial fibrillation     Ectopy: none     Conduction: normal   .Cardioversion  Date/Time: 09/05/2022 6:56 AM  Performed by: Merrily Pew, MD Authorized by: Merrily Pew, MD   Consent:    Consent obtained:  Written   Consent given by:  Patient   Risks discussed:  Cutaneous burn and death   Alternatives discussed:  Rate-control medication Pre-procedure details:    Cardioversion basis:  Emergent   Rhythm:  Atrial flutter   Electrode placement:  Anterior-posterior Patient sedated: Yes. Refer to sedation procedure documentation for  details of sedation.  Attempt one:    Cardioversion mode:  Synchronous   Waveform:  Biphasic   Shock (Joules):  150   Cardioversion outcome attempt one: afib with normal rate. Post-procedure details:    Patient status:  Awake   Patient tolerance of procedure:  Tolerated well, no immediate complications .Sedation  Date/Time: 09/05/2022 6:57 AM  Performed by: Merrily Pew, MD Authorized by: Merrily Pew, MD   Consent:    Consent obtained:  Emergent situation   Consent given by:  Patient   Risks discussed:  Allergic reaction, dysrhythmia, inadequate sedation, nausea, vomiting and prolonged sedation necessitating reversal Universal protocol:    Procedure explained and questions answered to patient or proxy's satisfaction: yes     Relevant documents present and verified: yes     Immediately prior to procedure, a time out was called: yes     Patient identity confirmed:  Arm band and verbally with patient Indications:    Procedure performed:  Cardioversion   Procedure necessitating sedation performed by:  Physician performing sedation Pre-sedation assessment:    Time since last food or drink:  2 hours   ASA classification: class 2 - patient with mild systemic disease     Mouth opening:  2  finger widths   Thyromental distance:  3 finger widths   Mallampati score:  III - soft palate, base of uvula visible   Neck mobility: normal     Pre-sedation assessments completed and reviewed: airway patency, cardiovascular function, hydration status, mental status, nausea/vomiting, pain level, respiratory function and temperature   Immediate pre-procedure details:    Reassessment: Patient reassessed immediately prior to procedure     Reviewed: vital signs     Verified: bag valve mask available, emergency equipment available, intubation equipment available, IV patency confirmed, oxygen available and suction available   Procedure details (see MAR for exact dosages):    Preoxygenation:  Nasal cannula   Sedation:  Etomidate   Intended level of sedation: deep   Analgesia:  Fentanyl   Intra-procedure monitoring:  Blood pressure monitoring, cardiac monitor, continuous pulse oximetry, continuous capnometry, frequent LOC assessments and frequent vital sign checks   Intra-procedure events: hypoxia     Intra-procedure management:  Supplemental oxygen   Total Provider sedation time (minutes):  17 Post-procedure details:    Post-sedation assessments completed and reviewed: airway patency, cardiovascular function, mental status and nausea/vomiting     Patient is stable for discharge or admission: yes     Procedure completion:  Tolerated well, no immediate complications     Medications Ordered in ED Medications  diltiazem (CARDIZEM CD) 24 hr capsule 180 mg (has no administration in time range)  promethazine (PHENERGAN) 12.5 mg in sodium chloride 0.9 % 50 mL IVPB (has no administration in time range)  apixaban (ELIQUIS) tablet 5 mg (has no administration in time range)  lactated ringers bolus 1,000 mL (0 mLs Intravenous Stopped 09/05/22 0623)  etomidate (AMIDATE) injection 10 mg (10 mg Intravenous Given 09/05/22 0544)  fentaNYL (SUBLIMAZE) injection 100 mcg (100 mcg Intravenous Given 09/05/22 0544)   etomidate (AMIDATE) injection (10 mg Intravenous Given 09/05/22 0545)  ondansetron (ZOFRAN) injection 4 mg (4 mg Intravenous Given 09/05/22 0600)    ED Course/ Medical Decision Making/ A&P Clinical Course as of 09/05/22 3664  Fri Sep 05, 2022  0708 Received signout from previous provider 9 M with CAD on Plavix who came in new A flutter not on [facility] was cardioverted but now return to A flutter started on dilt  gtt. Plan to admit. Follow up labs. [VB]    Clinical Course User Index [VB] Elgie Congo, MD                           Medical Decision Making Amount and/or Complexity of Data Reviewed Labs: ordered.  Risk Prescription drug management.   Atrial Flutter cardioverted to a normal rate but likely Afib. Cardizem/eliquis started did have some emesis so zofran and then phenergan given. Still with diaphoresis and chest pain so added on troponin to basic labs.   Care transferred pending labs and reevaluation for ultimate disposition.   Patient back in [place] will initiate diltiazem infusion and bolus. Likely will need admit.   Final Clinical Impression(s) / ED Diagnoses Final diagnoses:  None    Rx / DC Orders ED Discharge Orders          Ordered    Amb referral to AFIB Clinic        09/05/22 0604              Tabor Bartram, Corene Cornea, MD 09/05/22 [phone]

## 2022-09-05 NOTE — ED Notes (Signed)
Pt currently vomiting at this time. Provider made aware

## 2022-09-05 NOTE — ED Notes (Signed)
Cardiology at bedside.

## 2022-09-05 NOTE — ED Notes (Signed)
Patient states he is more comfortable

## 2022-09-05 NOTE — Progress Notes (Signed)
ANTICOAGULATION CONSULT NOTE - Initial Consult  Pharmacy Consult for Heparin infusion Indication: atrial fibrillation  Allergies  Allergen Reactions   Bee Venom Anaphylaxis   Penicillins Anaphylaxis    Did it involve swelling of the face/tongue/throat, SOB, or low BP? yes Did it involve sudden or severe rash/hives, skin peeling, or any reaction on the inside of your mouth or nose? unknown Did you need to seek medical attention at a hospital or doctor's office? yes When did it last happen?      1967 If all above answers are "NO", may proceed with cephalosporin use.    Procaine Anaphylaxis   Isosorbide Other (See Comments)    Caused migraines   Tape Itching and Other (See Comments)    Certain medical tapes make the patient's skin [facility]    Patient Measurements: Height: '5\' 8"'$  (172.7 cm) Weight: 117 kg (258 lb) IBW/kg (Calculated) : 68.4 Heparin Dosing Weight: 95 kg  Vital Signs: Temp: 97.9 F (36.6 C) (09/22 0501) Temp Source: Oral (09/22 0501) BP: 139/105 (09/22 0730) Pulse Rate: 144 (09/22 0730)  Labs: No results for input(s): "HGB", "HCT", "PLT", "APTT", "LABPROT", "INR", "HEPARINUNFRC", "HEPRLOWMOCWT", "CREATININE", "CKTOTAL", "CKMB", "TROPONINIHS" in the last 72 hours.  CrCl cannot be calculated (Patient's most recent lab result is older than the maximum 21 days allowed.).   Medical History: Past Medical History:  Diagnosis Date   Bipolar 1 disorder ([place])    Coronary artery disease    Diabetes mellitus    Dyslipidemia    Emphysema    Mental disorder    BIPOLAR DISORDER   Migraine    NSTEMI (non-ST elevated myocardial infarction) ([place]) 09/20/2020   PONV (postoperative nausea and vomiting)    Renal disorder    Shortness of breath    Stroke ([place])    Transient ischemic attack (TIA)     Medications:  (Not in a hospital admission)   Assessment: 61 yo M presented to ED w/ SOB and CP and HR 150-160s. Pt found to be in [place]. Pt placed on diltiazem infusion.  Continues to have nausea, vomiting and diarrhea and plan to admit.  Pt was not taking any anticoagulation prior to admission. Pharmacy consulted to dose and manage heparin infusion for AFib.   Goal of Therapy:  Heparin level 0.3-0.7 units/ml Monitor platelets by anticoagulation protocol: Yes   Plan:  Give 5700 units bolus x 1 Start heparin infusion at 1400 units/hr Check heparin level in 6 hours Monitor daily CBC, heparin level, and for s/sx of bleeding  Luisa Hart, PharmD, BCPS Clinical Pharmacist 09/05/2022 7:51 AM   Please refer to AMION for pharmacy phone number

## 2022-09-05 NOTE — ED Notes (Signed)
Pt is actively vomiting and diaphoretic denies any CP. Provider aware

## 2022-09-05 NOTE — ED Provider Notes (Signed)
  Physical Exam  BP 119/77   Pulse 72   Temp (!) 97.5 F (36.4 C) (Oral)   Resp 18   Ht '5\' 8"'$  (1.727 m)   Wt 117 kg   SpO2 92%   BMI 39.23 kg/m   Physical Exam  Procedures  Procedures CRITICAL CARE Performed by: Elgie Congo   Total critical care time: 35 minutes  Critical care time was exclusive of separately billable procedures and treating other patients.  Critical care was necessary to treat or prevent imminent or life-threatening deterioration.  Critical care was time spent personally by me on the following activities: development of treatment plan with patient and/or surrogate as well as nursing, discussions with consultants, evaluation of patient's response to treatment, examination of patient, obtaining history from patient or surrogate, ordering and performing treatments and interventions, ordering and review of laboratory studies, ordering and review of radiographic studies, pulse oximetry and re-evaluation of patient's condition.   ED Course / MDM   Clinical Course as of 09/05/22 1318  Fri Sep 05, 2022  0708 Received signout from previous provider 74 M with CAD on Plavix who came in new A flutter not on [facility] was cardioverted but now return to A flutter started on dilt gtt as well as heparin bolus and gtt. Plan to admit. Follow up labs. [VB]  2536 Patient chest x-ray performed which I also personally reviewed evidence of cardiomegaly but no pulmonary edema, no pneumonia, no pneumothorax.  No peripheral edema of the lower extremities.  No signs of DVT.  No hypoxia. [VB]  [id number] Patient has recently converted to normal sinus rhythm after being on Cardizem drip at 5.  We are discontinuing the Cardizem drip but continuing the heparin at this time with elevated troponin 665 and still endorsing substernal chest tightness with CAD history.  Aspirin ordered.  He did take a dose of baby aspirin this morning.  Cardiology consulted for admission. [VB]  0920 Discussed case with  Trish of cardiology who will have a cardiology team member come down to evaluate the patient for admission. [VB]  1301 Coreg 12.5 bid Added eliquis  Discontinued aspirin [VB]  66 Dr Sallyanne Kuster of cardiology reviewed patient's case.  Since patient is now asymptomatic without chest pain they do not think this is associated with ACS.  They believe his elevated troponin is due to demand ischemia from his arrhythmic event earlier today.  He has stayed out of a flutter/A-fib and remained hemodynamically stable.  They are increasing his Coreg to 12.5 twice daily and added Eliquis in his regimen and discontinuing aspirin while continuing clopidogrel.  They have arranged for his outpatient follow-up. He is chest pain free and stable.  Repeat EKG normal sinus rhythm with no acute ST/T changes concerning for acute ischemia.  I have discussed this with the patient he is in agreement with this plan to be discharged.  We have discussed strict return precautions. [VB]    Clinical Course User Index [VB] Elgie Congo, MD   Medical Decision Making Amount and/or Complexity of Data Reviewed Labs: ordered. Radiology: ordered.  Risk OTC drugs. Prescription drug management.         Elgie Congo, MD 09/05/22 1318

## 2022-09-05 NOTE — ED Notes (Signed)
Patient converted to NS ed provider aware. Ordering PO Cardizem.

## 2022-09-05 NOTE — Progress Notes (Signed)
RT called to bedside for a conscious sedation procedure complete with no complications

## 2022-09-05 NOTE — Discharge Instructions (Addendum)
You were seen in the emergency department today for abnormal heart rhythm which we were able to reverse with electricity and medications.  Start taking the Eliquis blood thinners as prescribed and discontinue your aspirin.  Also increase your Coreg to 12.5 mg 2 times daily.  Make sure you are still taking your atorvastatin and Plavix as prescribed.   Make sure to keep your follow-ups with the cardiology clinic and the A-fib clinic as planned.  Come back for any severe chest pain, shortness of breath, fainting, bloody stools, or any other symptoms concerning to you.

## 2022-09-05 NOTE — ED Notes (Signed)
ED provider at bedside.

## 2022-09-05 NOTE — ED Triage Notes (Signed)
Pt brought to ED by [facility] with c/o shortness of breath and chest pain. Per EMS, pt was tachycardic with rate 150-160. Adenosine '6mg'$  IVP administered and pt remained tachycardic with rate 150s.  EMS Interventions  Adenosine '6mg'$  IVP Cardizem '30mg'$  IVP 18g IV LFA  EMS Vitals BP 150/87 RR 20 SPO2 95% RA

## 2022-09-05 NOTE — ED Notes (Signed)
Pt continues to have multiple episodes of vomiting. Noted increase in heart rate into the 140's. Repeat EKG done and provider made aware of both. Pt currently still diaphoretic c/o being hot. Provided a fan and wet cloths to neck and forehead also provided ice packs if needed

## 2022-09-06 ENCOUNTER — Other Ambulatory Visit: Payer: Self-pay

## 2022-09-06 ENCOUNTER — Inpatient Hospital Stay (HOSPITAL_COMMUNITY): Payer: 59

## 2022-09-06 ENCOUNTER — Emergency Department (HOSPITAL_COMMUNITY): Payer: 59

## 2022-09-06 ENCOUNTER — Inpatient Hospital Stay (HOSPITAL_COMMUNITY)
Admission: EM | Admit: 2022-09-06 | Discharge: 2022-09-09 | DRG: 273 | Disposition: A | Discharge status: Home / Self Care | Payer: 59 | Attending: Student in an Organized Health Care Education/Training Program | Admitting: Student in an Organized Health Care Education/Training Program

## 2022-09-06 ENCOUNTER — Encounter (HOSPITAL_COMMUNITY): Payer: Self-pay | Admitting: Emergency Medicine

## 2022-09-06 DIAGNOSIS — G4733 Obstructive sleep apnea (adult) (pediatric): Secondary | ICD-10-CM | POA: Diagnosis present

## 2022-09-06 DIAGNOSIS — I252 Old myocardial infarction: Secondary | ICD-10-CM

## 2022-09-06 DIAGNOSIS — I4589 Other specified conduction disorders: Secondary | ICD-10-CM | POA: Diagnosis present

## 2022-09-06 DIAGNOSIS — Z833 Family history of diabetes mellitus: Secondary | ICD-10-CM | POA: Diagnosis not present

## 2022-09-06 DIAGNOSIS — Z87891 Personal history of nicotine dependence: Secondary | ICD-10-CM | POA: Diagnosis not present

## 2022-09-06 DIAGNOSIS — Z7982 Long term (current) use of aspirin: Secondary | ICD-10-CM | POA: Diagnosis not present

## 2022-09-06 DIAGNOSIS — I4891 Unspecified atrial fibrillation: Secondary | ICD-10-CM | POA: Diagnosis present

## 2022-09-06 DIAGNOSIS — I4892 Unspecified atrial flutter: Secondary | ICD-10-CM | POA: Diagnosis not present

## 2022-09-06 DIAGNOSIS — Z8249 Family history of ischemic heart disease and other diseases of the circulatory system: Secondary | ICD-10-CM | POA: Diagnosis not present

## 2022-09-06 DIAGNOSIS — F319 Bipolar disorder, unspecified: Secondary | ICD-10-CM | POA: Diagnosis present

## 2022-09-06 DIAGNOSIS — Z7984 Long term (current) use of oral hypoglycemic drugs: Secondary | ICD-10-CM

## 2022-09-06 DIAGNOSIS — E119 Type 2 diabetes mellitus without complications: Secondary | ICD-10-CM | POA: Diagnosis present

## 2022-09-06 DIAGNOSIS — J439 Emphysema, unspecified: Secondary | ICD-10-CM | POA: Diagnosis present

## 2022-09-06 DIAGNOSIS — Z7902 Long term (current) use of antithrombotics/antiplatelets: Secondary | ICD-10-CM

## 2022-09-06 DIAGNOSIS — Z880 Allergy status to penicillin: Secondary | ICD-10-CM

## 2022-09-06 DIAGNOSIS — I25119 Atherosclerotic heart disease of native coronary artery with unspecified angina pectoris: Secondary | ICD-10-CM | POA: Diagnosis present

## 2022-09-06 DIAGNOSIS — E785 Hyperlipidemia, unspecified: Secondary | ICD-10-CM | POA: Diagnosis present

## 2022-09-06 DIAGNOSIS — I21A1 Myocardial infarction type 2: Secondary | ICD-10-CM | POA: Diagnosis present

## 2022-09-06 DIAGNOSIS — Z6839 Body mass index (BMI) 39.0-39.9, adult: Secondary | ICD-10-CM

## 2022-09-06 DIAGNOSIS — Z5986 Financial insecurity: Secondary | ICD-10-CM | POA: Diagnosis not present

## 2022-09-06 DIAGNOSIS — Z8673 Personal history of transient ischemic attack (TIA), and cerebral infarction without residual deficits: Secondary | ICD-10-CM

## 2022-09-06 DIAGNOSIS — I483 Typical atrial flutter: Principal | ICD-10-CM | POA: Diagnosis present

## 2022-09-06 DIAGNOSIS — Z801 Family history of malignant neoplasm of trachea, bronchus and lung: Secondary | ICD-10-CM

## 2022-09-06 DIAGNOSIS — I10 Essential (primary) hypertension: Secondary | ICD-10-CM | POA: Diagnosis present

## 2022-09-06 DIAGNOSIS — Z79899 Other long term (current) drug therapy: Secondary | ICD-10-CM | POA: Diagnosis not present

## 2022-09-06 DIAGNOSIS — Z888 Allergy status to other drugs, medicaments and biological substances status: Secondary | ICD-10-CM

## 2022-09-06 DIAGNOSIS — Z955 Presence of coronary angioplasty implant and graft: Secondary | ICD-10-CM | POA: Diagnosis not present

## 2022-09-06 DIAGNOSIS — Z720 Tobacco use: Secondary | ICD-10-CM

## 2022-09-06 DIAGNOSIS — I214 Non-ST elevation (NSTEMI) myocardial infarction: Secondary | ICD-10-CM

## 2022-09-06 DIAGNOSIS — Z884 Allergy status to anesthetic agent status: Secondary | ICD-10-CM

## 2022-09-06 HISTORY — DX: Essential (primary) hypertension: I10

## 2022-09-06 HISTORY — DX: Unspecified atrial flutter: I48.92

## 2022-09-06 LAB — ECHOCARDIOGRAM COMPLETE
Area-P 1/2: 4.6 cm2
Height: 68 in
S' Lateral: 2.8 cm
Weight: 4137.6 oz

## 2022-09-06 LAB — LIPID PANEL
Cholesterol: 85 mg/dL (ref 0–200)
HDL: 23 mg/dL — ABNORMAL LOW (ref >40)
LDL Cholesterol: 28 mg/dL (ref 0–99)
Total CHOL/HDL Ratio: 3.7 RATIO
Triglycerides: 169 mg/dL — ABNORMAL HIGH (ref <150)
VLDL: 34 mg/dL (ref 0–40)

## 2022-09-06 LAB — APTT: aPTT: 50 seconds — ABNORMAL HIGH (ref 24–36)

## 2022-09-06 LAB — BASIC METABOLIC PANEL
Anion gap: 9 (ref 5–15)
BUN: 21 mg/dL — ABNORMAL HIGH (ref 6–20)
CO2: 23 mmol/L (ref 22–32)
Calcium: 8.9 mg/dL (ref 8.9–10.3)
Chloride: 105 mmol/L (ref 98–111)
Creatinine, Ser: 0.82 mg/dL (ref 0.61–1.24)
GFR, Estimated: >60 mL/min (ref >60)
Glucose, Bld: 226 mg/dL — ABNORMAL HIGH (ref 70–99)
Potassium: 4.1 mmol/L (ref 3.5–5.1)
Sodium: 137 mmol/L (ref 135–145)

## 2022-09-06 LAB — GLUCOSE, CAPILLARY
Glucose-Capillary: 229 mg/dL — ABNORMAL HIGH (ref 70–99)
Glucose-Capillary: 233 mg/dL — ABNORMAL HIGH (ref 70–99)
Glucose-Capillary: 219 mg/dL — ABNORMAL HIGH (ref 70–99)
Glucose-Capillary: 167 mg/dL — ABNORMAL HIGH (ref 70–99)
Glucose-Capillary: 203 mg/dL — ABNORMAL HIGH (ref 70–99)
Glucose-Capillary: 167 mg/dL — ABNORMAL HIGH (ref 70–99)

## 2022-09-06 LAB — HEPARIN LEVEL (UNFRACTIONATED)
Heparin Unfractionated: <0.1 IU/mL — ABNORMAL LOW (ref 0.30–0.70)
Heparin Unfractionated: 0.19 IU/mL — ABNORMAL LOW (ref 0.30–0.70)

## 2022-09-06 LAB — CBC
HCT: 43.1 % (ref 39.0–52.0)
Hemoglobin: 14.1 g/dL (ref 13.0–17.0)
MCH: 28.5 pg (ref 26.0–34.0)
MCHC: 32.7 g/dL (ref 30.0–36.0)
MCV: 87.1 fL (ref 80.0–100.0)
Platelets: 261 10*3/uL (ref 150–400)
RBC: 4.95 MIL/uL (ref 4.22–5.81)
RDW: 13.5 % (ref 11.5–15.5)
WBC: 11.3 10*3/uL — ABNORMAL HIGH (ref 4.0–10.5)
nRBC: 0 % (ref 0.0–0.2)

## 2022-09-06 LAB — HIV ANTIBODY (ROUTINE TESTING W REFLEX): HIV Screen 4th Generation wRfx: NONREACTIVE

## 2022-09-06 LAB — HEMOGLOBIN A1C
Hgb A1c MFr Bld: 8.6 % — ABNORMAL HIGH (ref 4.8–5.6)
Mean Plasma Glucose: 200.12 mg/dL

## 2022-09-06 LAB — TSH: TSH: 0.853 u[IU]/mL (ref 0.350–4.500)

## 2022-09-06 LAB — TROPONIN I (HIGH SENSITIVITY)
Troponin I (High Sensitivity): 2369 ng/L (ref <18)
Troponin I (High Sensitivity): 1566 ng/L (ref <18)

## 2022-09-06 LAB — CBG MONITORING, ED: Glucose-Capillary: 183 mg/dL — ABNORMAL HIGH (ref 70–99)

## 2022-09-06 LAB — MAGNESIUM: Magnesium: 1.8 mg/dL (ref 1.7–2.4)

## 2022-09-06 LAB — T4, FREE: Free T4: 0.83 ng/dL (ref 0.61–1.12)

## 2022-09-06 MED ORDER — HEPARIN (PORCINE) 25000 UT/250ML-% IV SOLN
2000.0000 [IU]/h | INTRAVENOUS | Status: DC
  Administered 2022-09-06: 1700 [IU]/h via INTRAVENOUS
  Administered 2022-09-06: 1400 [IU]/h via INTRAVENOUS
  Administered 2022-09-07: 2000 [IU]/h via INTRAVENOUS
  Filled 2022-09-06 (×3): qty 250

## 2022-09-06 MED ORDER — DILTIAZEM LOAD VIA INFUSION
10.0000 mg | Freq: Once | INTRAVENOUS | Status: AC
  Administered 2022-09-06: 10 mg via INTRAVENOUS
  Filled 2022-09-06: qty 10

## 2022-09-06 MED ORDER — ACETAMINOPHEN 325 MG PO TABS: 650.0000 mg | ORAL_TABLET | ORAL | Status: DC | PRN

## 2022-09-06 MED ORDER — CLOPIDOGREL BISULFATE 75 MG PO TABS
75.0000 mg | ORAL_TABLET | Freq: Every day | ORAL | Status: DC
  Administered 2022-09-06 – 2022-09-09 (×4): 75 mg via ORAL
  Filled 2022-09-06 (×4): qty 1

## 2022-09-06 MED ORDER — INSULIN ASPART 100 UNIT/ML IJ SOLN
0.0000 [IU] | Freq: Three times a day (TID) | INTRAMUSCULAR | Status: DC
  Administered 2022-09-06: 2 [IU] via SUBCUTANEOUS
  Administered 2022-09-06 (×2): 4 [IU] via SUBCUTANEOUS

## 2022-09-06 MED ORDER — HEPARIN BOLUS VIA INFUSION
3000.0000 [IU] | Freq: Once | INTRAVENOUS | Status: AC
  Administered 2022-09-06: 3000 [IU] via INTRAVENOUS
  Filled 2022-09-06: qty 3000

## 2022-09-06 MED ORDER — ATORVASTATIN CALCIUM 80 MG PO TABS
80.0000 mg | ORAL_TABLET | Freq: Every day | ORAL | Status: DC
  Administered 2022-09-06 – 2022-09-09 (×4): 80 mg via ORAL
  Filled 2022-09-06 (×4): qty 1

## 2022-09-06 MED ORDER — MAGNESIUM SULFATE 2 GM/50ML IV SOLN
2.0000 g | Freq: Once | INTRAVENOUS | Status: AC
  Administered 2022-09-06: 2 g via INTRAVENOUS
  Filled 2022-09-06: qty 50

## 2022-09-06 MED ORDER — INSULIN ASPART 100 UNIT/ML IJ SOLN
0.0000 [IU] | Freq: Three times a day (TID) | INTRAMUSCULAR | Status: DC
  Administered 2022-09-07: 10 [IU] via SUBCUTANEOUS
  Administered 2022-09-07: 2 [IU] via SUBCUTANEOUS
  Administered 2022-09-07 – 2022-09-08 (×3): 4 [IU] via SUBCUTANEOUS
  Administered 2022-09-08 – 2022-09-09 (×3): 8 [IU] via SUBCUTANEOUS

## 2022-09-06 MED ORDER — DILTIAZEM HCL-DEXTROSE 125-5 MG/125ML-% IV SOLN (PREMIX)
5.0000 mg/h | INTRAVENOUS | Status: DC
  Administered 2022-09-06: 10 mg/h via INTRAVENOUS
  Administered 2022-09-06: 5 mg/h via INTRAVENOUS
  Administered 2022-09-06: 10 mg/h via INTRAVENOUS
  Administered 2022-09-06: 5 mg/h via INTRAVENOUS
  Filled 2022-09-06 (×3): qty 125

## 2022-09-06 MED ORDER — NICOTINE 14 MG/24HR TD PT24
14.0000 mg | MEDICATED_PATCH | Freq: Every day | TRANSDERMAL | Status: DC
  Administered 2022-09-09: 14 mg via TRANSDERMAL
  Filled 2022-09-06 (×4): qty 1

## 2022-09-06 NOTE — Progress Notes (Signed)
  Echocardiogram 2D Echocardiogram has been performed.  Jerry Shaffer 09/06/2022, 10:56 AM

## 2022-09-06 NOTE — Progress Notes (Signed)
ANTICOAGULATION CONSULT NOTE - Follow Up  Pharmacy Consult for Heparin Indication: atrial fibrillation  Allergies  Allergen Reactions   Bee Venom Anaphylaxis   Penicillins Anaphylaxis    Did it involve swelling of the face/tongue/throat, SOB, or low BP? yes Did it involve sudden or severe rash/hives, skin peeling, or any reaction on the inside of your mouth or nose? unknown Did you need to seek medical attention at a hospital or doctor's office? yes When did it last happen?      1967 If all above answers are "NO", may proceed with cephalosporin use.    Procaine Anaphylaxis   Isosorbide Other (See Comments)    Caused migraines   Tape Itching and Other (See Comments)    Certain medical tapes make the patient's skin [facility]    Patient Measurements: Height: '5\' 8"'$  (172.7 cm) Weight: 117.3 kg (258 lb 9.6 oz) IBW/kg (Calculated) : 68.4 Heparin Dosing Weight: 95 kg  Vital Signs: Temp: 98.6 F (37 C) (09/23 0744) Temp Source: Oral (09/23 0744) BP: 142/90 (09/23 1048) Pulse Rate: 83 (09/23 1048)  Labs: Recent Labs    09/05/22 0728 09/05/22 0935 09/06/22 0030 09/06/22 0226 09/06/22 1010  HGB 14.3  --  14.1  --   --   HCT 43.6  --  43.1  --   --   PLT 223  --  261  --   --   APTT  --   --   --   --  50*  HEPARINUNFRC  --   --   --   --  <0.10*  CREATININE 0.72  --  0.82  --   --   TROPONINIHS 665* 2,141* 2,369* 1,566*  --     Estimated Creatinine Clearance: 120.7 mL/min (by C-G formula based on SCr of 0.82 mg/dL).   Assessment: 62 YOM presented with SOB and CP on 9/22, found to be in [place].  It is unclear whether patient received Eliquis prior to discharge (9/22's MAR did not show Eliquis was charted as given).  Patient returned 9/23 with similar symptoms.  Pharmacy consulted to dose IV Heparin for new-onset Afib.  Renal function and CBC stable.  9/23: Heparin level undetectable (<0.1) and aPTT 50s (subtherapeutic). CBC stable. No issues running the infusion or any signs  of bleeding per patient and nursing.  Goal of Therapy:  Heparin level 0.3-0.7 units/ml Monitor platelets by anticoagulation protocol: Yes   Plan:  heparin bolus 3000 units IV, then increase IV heparin at 1700 units/hr Check 6 hr heparin level since levels correlate Daily heparin level and CBC  Sandford Craze, PharmD. [facility] Acute Care PGY-1  09/06/2022 12:28 PM

## 2022-09-06 NOTE — H&P (Signed)
Cardiology Admission History and Physical   Patient ID: Jerry Shaffer MRN: [id number]; DOB: 11-03-1961   Admission date: 09/06/2022  PCP:  Patient, No Pcp Per   [place] [facility] Cardiologist:  Sanda Klein, MD        Chief Complaint: Tachycardia  Patient Profile:   Jerry Shaffer is a 60 y.o. male with a PMH of CAD s/p NSTEMI w/ PCI to PDA (09/20/20) s/p PCI to pLAD (03/28/21), HTN, HLD, uncontrolled DM2, TIA and morbid obesity who is being seen 09/06/2022 for the evaluation of atrial flutter with RVR.  History of Present Illness:   Jerry Shaffer was in his USOH until yesterday at approximately 3 AM when he developed sudden onset palpitations, chest pain, diaphoresis, and SOB.  The symptoms prompted him to call EMS who noted he was tachycardic to the 150s.  He was given adenosine and Cardizem IV pushes, but remained tachycardic upon arrival to the ED.  EKG demonstrated new onset atrial flutter.  Cardiology was consulted and a DCCV was performed in the ED which converted the patient to a rate controlled atrial fibrillation.  He was started on a diltiazem gtt and later converted to NSR while on the drip.  Of note his laboratory work-up was notable for hypomagnesemia to 1.5 and troponins that peaked at 2,141.  His troponin elevation was felt to be secondary to demand ischemia in the setting of a flutter with RVR.  Given that he converted to NSR, he was discharged home with an increased dose of Coreg for rate control and a prescription for apixaban for [facility] with planned cardiology follow-up.  Later on yesterday evening the patient was laying in bed when he had recurrence of his palpitations and his heart rate read 150 again on his heart monitor.  He presented once more to the ED and was found to be back in atrial flutter with RVR.  He endorses some mild left-sided chest discomfort and palpitations, but is otherwise asymptomatic.  He was started back on a diltiazem gtt. with  cardiology consultation for further management.  He denies fevers, chills, PND, orthopnea, swelling, URI symptoms, N/V/D, abdominal pain, focal weakness or numbness.  He endorses chronic SOB in the aforementioned symptoms related to his atrial flutter. Of note the patient has no family history of atrial arrhythmias to his knowledge.  He has a prior history of tobacco and vaping use, but has quit and uses nicotine patches.  He drinks EtOH seldomly and denies active illicit drug use.  He works as a Presenter, broadcasting.  In the ED his VS were afebrile, HR 154, BP 122/87, RR 17, satting 97% on RA.  While I was examining the patient, his HR jumped between 98-150s.  Labs are notable for WBC 11.3 and troponin 2369.  EKG once again demonstrated atrial flutter with RVR.  CXR was unremarkable.   Past Medical History:  Diagnosis Date   Bipolar 1 disorder ([place])    Coronary artery disease    Diabetes mellitus    Dyslipidemia    Emphysema    Mental disorder    BIPOLAR DISORDER   Migraine    NSTEMI (non-ST elevated myocardial infarction) ([place]) 09/20/2020   PONV (postoperative nausea and vomiting)    Renal disorder    Shortness of breath    Stroke ([place])    Transient ischemic attack (TIA)     Past Surgical History:  Procedure Laterality Date   COLONOSCOPY WITH PROPOFOL N/A 10/17/2021   Procedure: COLONOSCOPY WITH  PROPOFOL;  Surgeon: Milus Banister, MD;  Location: [facility];  Service: Endoscopy;  Laterality: N/A;   CORONARY ANGIOGRAPHY  09/20/2020   CORONARY STENT INTERVENTION  09/20/2020   CORONARY STENT INTERVENTION   CORONARY STENT INTERVENTION N/A 09/20/2020   Procedure: CORONARY STENT INTERVENTION;  Surgeon: Belva Crome, MD;  Location: [facility];  Service: Cardiovascular;  Laterality: N/A;   CORONARY STENT INTERVENTION N/A 03/28/2021   Procedure: CORONARY STENT INTERVENTION;  Surgeon: [name], MD;  Location: [place] [facility];  Service: Cardiovascular;  Laterality: N/A;    HEMORROIDECTOMY     KNEE ARTHROSCOPY     LEFT HEART CATH AND CORONARY ANGIOGRAPHY N/A 09/20/2020   Procedure: LEFT HEART CATH AND CORONARY ANGIOGRAPHY;  Surgeon: Belva Crome, MD;  Location: [facility] [facility];  Service: Cardiovascular;  Laterality: N/A;   LEFT HEART CATH AND CORONARY ANGIOGRAPHY N/A 03/28/2021   Procedure: LEFT HEART CATH AND CORONARY ANGIOGRAPHY;  Surgeon: [name], MD;  Location: [facility];  Service: Cardiovascular;  Laterality: N/A;   LUMBAR DISC SURGERY     POLYPECTOMY  10/17/2021   Procedure: POLYPECTOMY;  Surgeon: Milus Banister, MD;  Location: [facility];  Service: Endoscopy;;   TONSILLECTOMY AND ADENOIDECTOMY       Medications Prior to Admission: Prior to Admission medications   Medication Sig Start Date End Date Taking? Authorizing Provider  apixaban (ELIQUIS) 5 MG TABS tablet Take 1 tablet (5 mg total) by mouth 2 (two) times daily. 09/05/22   Nooruddin, Marlene Lard, MD  atorvastatin (LIPITOR) 80 MG tablet TAKE 1 TABLET BY MOUTH EVERY DAY 07/07/22   Lendon Colonel, NP  carvedilol (COREG) 12.5 MG tablet Take 1 tablet (12.5 mg total) by mouth 2 (two) times daily with a meal. 09/05/22   Nooruddin, Marlene Lard, MD  clopidogrel (PLAVIX) 75 MG tablet TAKE 1 TABLET(75 MG) BY MOUTH DAILY WITH BREAKFAST 08/13/22   Croitoru, Mihai, MD  glipiZIDE (GLUCOTROL) 5 MG tablet Take 1 tablet (5 mg total) by mouth daily before breakfast. 08/26/22   Philemon Kingdom, MD  metFORMIN (GLUCOPHAGE-XR) 500 MG 24 hr tablet Take 4 tablets (2,000 mg total) by mouth daily. 08/26/22   Philemon Kingdom, MD     Allergies:    Allergies  Allergen Reactions   Bee Venom Anaphylaxis   Penicillins Anaphylaxis    Did it involve swelling of the face/tongue/throat, SOB, or low BP? yes Did it involve sudden or severe rash/hives, skin peeling, or any reaction on the inside of your mouth or nose? unknown Did you need to seek medical attention at a hospital or doctor's office? yes When did it last  happen?      1967 If all above answers are "NO", may proceed with cephalosporin use.    Procaine Anaphylaxis   Isosorbide Other (See Comments)    Caused migraines   Tape Itching and Other (See Comments)    Certain medical tapes make the patient's skin ITCH    Social History:   Social History   Socioeconomic History   Marital status: Single    Spouse name: Not on file   Number of children: Not on file   Years of education: Not on file   Highest education level: Not on file  Occupational History   Occupation: Security guard    Employer: [employer]  Tobacco Use   Smoking status: Former    Types: E-cigarettes   Smokeless tobacco: Current  Vaping Use   Vaping Use: Never used  Substance and Sexual Activity   Alcohol use: No   Drug use: No   Sexual activity: Not Currently  Other Topics Concern   Not on file  Social History Narrative   Not on file   Social Determinants of Health   Financial Resource Strain: Not on file  Food Insecurity: Not on file  Transportation Needs: Not on file  Physical Activity: Not on file  Stress: Not on file  Social Connections: Not on file  Intimate Partner Violence: Not on file    Family History:   The patient's family history includes Bipolar disorder in an other family member; CAD (age of onset: 36) in his mother; Coronary artery disease in an other family member; Diabetes type II in an other family member; Lung cancer in his father.    ROS:  Please see the history of present illness.  All other ROS reviewed and negative.     Physical Exam/Data:   Vitals:   09/06/22 0130 09/06/22 0151 09/06/22 0200 09/06/22 0215  BP: 132/81 (!) 121/102 125/83 (!) 128/101  Pulse: (!) 155 (!) 153 (!) 152 (!) 152  Resp: 11 11  (!) 23  Temp:      TempSrc:      SpO2: 97% 97% 95% 95%   No intake or output data in the 24 hours ending 09/06/22 0255    09/05/2022    7:36 AM 08/26/2022    8:39 AM 06/27/2022    9:31 AM  Last 3 Weights  Weight  (lbs) 258 lb 261 lb 255 lb  Weight (kg) 117.028 kg 118.389 kg 115.667 kg     There is no height or weight on file to calculate BMI.  General:  Well nourished, well developed, in no acute distress HEENT: normal Neck: no JVD Vascular: Distal pulses 2+ bilaterally   Cardiac: Tachycardic with regularly irregular rhythm, normal S1/S2, no M/R/G Lungs:  clear to auscultation bilaterally, no wheezing, rhonchi or rales  Abd: soft, nontender, no hepatomegaly, obese Ext: no edema, WWP Musculoskeletal:  No deformities, BUE and BLE strength normal and equal Skin: warm and dry  Neuro:  CNs 2-12 intact, no focal abnormalities noted Psych:  Normal affect    EKG:     Relevant CV Studies:  ECHO 09/20/2020  1. Left ventricular ejection fraction, by estimation, is 55 to 60%. The  left ventricle has normal function. The left ventricle has no regional  wall motion abnormalities. There is severe left ventricular hypertrophy.  Left ventricular diastolic parameters   are consistent with Grade I diastolic dysfunction (impaired relaxation).   2. Right ventricular systolic function is normal. The right ventricular  size is normal. Tricuspid regurgitation signal is inadequate for assessing  PA pressure.   3. The mitral valve is normal in structure. No evidence of mitral valve  regurgitation. No evidence of mitral stenosis.   4. The aortic valve is tricuspid. Aortic valve regurgitation is not  visualized. No aortic stenosis is present.   5. Aortic dilatation noted. There is mild dilatation of the aortic root,  measuring 38 mm.   6. The inferior vena cava is normal in size with greater than 50%  respiratory variability, suggesting right atrial pressure of 3 mmHg.    Cath 09/20/2020 Total occlusion of the mid PDA (a large vessel that extends to the left ventricular apex), filling sluggishly by apical LAD to PDA collaterals.  This vessel is felt to represent the culprit for the patient's  presentation. Total occlusion of the mid PDA reduced  to 0% using an 18 x 2.75 mm Onyx deployed at 12 atm x 2 inflations.  TIMI grade III flow was noted.  Resolution of chest pain occurred.  Proximal to the stented segment there is a 30 to 40% stenosis in the PDA. Left main is widely patent LAD contains mid vessel disease with a Medina 011 bifurcation stenosis.  LAD is 70% and diagonal is 40%.  This can be managed medically. Circumflex contains segmental 40 to 50% stenosis in the large second obtuse marginal. RCA is dominant, tortuous, and no significant disease other than that noted above in the PDA. Mid inferior wall to apical akinesis.  LVEDP 6 mmHg.  EF 45 to 50%.   Cath 03/28/21 1.  Patent left main 2.  Severe stenosis of the proximal LAD, new from cardiac catheterization 6 months ago, treated successfully with PCI using a 3.5 x 20 mm Synergy DES.  The mid LAD is unchanged from the prior study with Medina 0, 1, 1 bifurcation disease with only moderate stenosis in the LAD and mild stenosis in the diagonal 3.  Moderate 50% mid circumflex stenosis 4.  Continued patency of the right PDA stent with mild diffuse nonobstructive PDA and RCA stenosis 5.  Normal LVEDP  Laboratory Data:  High Sensitivity Troponin:   Recent Labs  Lab 09/05/22 0728 09/05/22 0935 09/06/22 0030  TROPONINIHS 665* 2,141* 2,369*      Chemistry Recent Labs  Lab 09/05/22 0728 09/06/22 0030  NA 137 137  K 4.1 4.1  CL 107 105  CO2 23 23  GLUCOSE 307* 226*  BUN 15 21*  CREATININE 0.72 0.82  CALCIUM 8.7* 8.9  MG 1.5*  --   GFRNONAA >60 >60  ANIONGAP 7 9    Recent Labs  Lab 09/05/22 0728  PROT 6.1*  ALBUMIN 3.8  AST 23  ALT 22  ALKPHOS 98  BILITOT 0.6   Lipids No results for input(s): "CHOL", "TRIG", "HDL", "LABVLDL", "LDLCALC", "CHOLHDL" in the last 168 hours. Hematology Recent Labs  Lab 09/05/22 0728 09/06/22 0030  WBC 10.6* 11.3*  RBC 5.05 4.95  HGB 14.3 14.1  HCT 43.6 43.1  MCV 86.3 87.1   MCH 28.3 28.5  MCHC 32.8 32.7  RDW 13.4 13.5  PLT 223 261   Thyroid No results for input(s): "TSH", "FREET4" in the last 168 hours. BNP Recent Labs  Lab 09/05/22 0728  BNP 101.3*    DDimer No results for input(s): "DDIMER" in the last 168 hours.   Radiology/Studies:  DG Chest Port 1 View  Result Date: 09/06/2022 CLINICAL DATA:  Chest pain. EXAM: PORTABLE CHEST 1 VIEW COMPARISON:  September 05, 2022 FINDINGS: The heart size and mediastinal contours are within normal limits. Both lungs are clear. The visualized skeletal structures are unremarkable. IMPRESSION: No active cardiopulmonary disease. Electronically Signed   By: Virgina Norfolk M.D.   On: 09/06/2022 01:07   CT Angio Chest Pulmonary Embolism (PE) W or WO Contrast  Result Date: 09/05/2022 CLINICAL DATA:  Chest pain and shortness of breath, new onset flutter EXAM: CT ANGIOGRAPHY CHEST WITH CONTRAST TECHNIQUE: Multidetector CT imaging of the chest was performed using the standard protocol during bolus administration of intravenous contrast. Multiplanar CT image reconstructions and MIPs were obtained to evaluate the vascular anatomy. RADIATION DOSE REDUCTION: This exam was performed according to the departmental dose-optimization program which includes automated exposure control, adjustment of the mA and/or kV according to patient size and/or use of iterative reconstruction technique. CONTRAST:  42mL OMNIPAQUE IOHEXOL 350  MG/ML SOLN COMPARISON:  09/05/2022, 01/27/2022 FINDINGS: Cardiovascular: Pulmonary arteries are normal in caliber and patent. No significant filling defect or pulmonary embolus by CT. normal heart size. No pericardial effusion. Native coronary atherosclerosis noted. Aorta atherosclerotic. No aneurysm or dissection. No mediastinal hemorrhage or hematoma. Central venous structures are patent.  No veno-occlusive finding. Mediastinum/Nodes: No enlarged mediastinal, hilar, or axillary lymph nodes. Thyroid gland, trachea, and  esophagus demonstrate no significant findings. Lungs/Pleura: Mild bibasilar posterior dependent atelectasis. Trace right pleural effusion. Trachea and central airways are patent. No pneumothorax. Upper Abdomen: No acute upper abdominal finding. Colonic diverticulosis noted. Musculoskeletal: Degenerative changes throughout the spine. No chest wall soft tissue abnormality or asymmetry. Review of the MIP images confirms the above findings. IMPRESSION: Negative for significant acute pulmonary embolus by CTA. Bibasilar atelectasis and trace right pleural effusion. Native coronary atherosclerosis Aortic Atherosclerosis (ICD10-I70.0). Electronically Signed   By: Jerilynn Mages.  Shick M.D.   On: 09/05/2022 10:37   DG Chest Portable 1 View  Result Date: 09/05/2022 CLINICAL DATA:  Atrial fibrillation with rapid ventricular response. EXAM: PORTABLE CHEST 1 VIEW COMPARISON:  PA single view 01/03/2022 and chest CT 01/27/2022 FINDINGS: There is an electrical pad superimposing over the left upper to mid lung region and overlying monitor wires. The heart silhouette is enlarged and projects larger than previously. The central vessels are prominent but no overt edema is seen. There is a stable mediastinum with mild aortic tortuosity and atherosclerosis. Lungs are hypoinflated but generally clear, although with limited view of the lung bases. Thoracic spondylosis. IMPRESSION: 1. Cardiomegaly with central vascular prominence without overt edema. 2. Hypoinflated chest with no acute radiographic findings with limited view of the bases. Electronically Signed   By: Telford Nab M.D.   On: 09/05/2022 07:32     Assessment and Plan:   DONAVIN AUDINO is a 61 y.o. male with a PMH of CAD s/p NSTEMI w/ PCI to PDA (09/20/20) s/p PCI to pLAD (03/28/21), HTN, HLD, uncontrolled DM2, TIA and morbid obesity who is being seen 09/06/2022 for the evaluation of atrial flutter with RVR.  #New Atrial Flutter with RVR s/p DCCV 09/05/22 #Type 2 NSTEMI ::  Patient presenting for the second time in <24 hrs with a flutter with RVR that is newly diagnosed.  Largely asymptomatic currently.  He has several risk factors for the development of atrial arrhythmias including diabetes, hypertension, obesity, possible sleep apnea, prior TIAs, and CAD.  We will still check thyroid function and an echo to rule out thyrotoxicosis and structural heart disease as a precipitating factor.  In terms of treatment, he will need to get back into NSR given that he is very symptomatic from his atrial flutter.  Atrial flutter is notoriously hard to rate control so I suspect a rhythm control strategy will be necessary for this patient.  I recommend discussing with EP the best strategy for rhythm control, but I suspect either amiodarone + DCCV vs. CTI ablation being the next course of action (assuming he does not convert to NSR on his own).  His CHA2DS-VASc score = 5 which necessitates long-term use of Duck.  The patient expressed concerns about the affordability of Eliquis so we will need to check that pricing prior to discharge. -Discussed AAD + DCCV vs CTI ablation with EP -Hold Eliquis pending definitive treatment strategy -Start heparin per pharmacy -Continue diltiazem gtt. -Check TSH, lipid panel, A1c -ECHO complete -Keep Mg >2, K>4 -Consider ischemic evaluation with NM stress once stable -Confirm pricing of DOAC prior to  discharge  #CAD s/p PCIs #HLD -Stopped ASA and lieu of DOAC -Continue Plavix 75 Mg daily -Continue atorvastatin -Lipid panel as above -Consider ischemic evaluation while inpatient  #DM2 -Hold oral DM2 medications while inpatient -Start SSI + POC glucose checks  #Tobacco Use -Nicotine patch    Risk Assessment/Risk Scores:         CHA2DS2-VASc Score = 5  This indicates a 7.2% annual risk of stroke.      Severity of Illness: The appropriate patient status for this patient is INPATIENT. Inpatient status is judged to be reasonable and  necessary in order to provide the required intensity of service to ensure the patient's safety. The patient's presenting symptoms, physical exam findings, and initial radiographic and laboratory data in the context of their chronic comorbidities is felt to place them at high risk for further clinical deterioration. Furthermore, it is not anticipated that the patient will be medically stable for discharge from the hospital within 2 midnights of admission.   * I certify that at the point of admission it is my clinical judgment that the patient will require inpatient hospital care spanning beyond 2 midnights from the point of admission due to high intensity of service, high risk for further deterioration and high frequency of surveillance required.*   For questions or updates, please contact [employer] Please consult [email or web address] for contact info under     Signed, Hershal Coria, MD  09/06/2022 2:55 AM

## 2022-09-06 NOTE — Progress Notes (Addendum)
  Paged Dr. Conley Canal at [phone] and [phone] to see if Pt can get an order for night time coverage. Pts CBG was [phone] at 2051. Has only coverage for meals.

## 2022-09-06 NOTE — Progress Notes (Signed)
Rounding Note    Patient Name: Jerry Shaffer Date of Encounter: 09/06/2022  [place] Cardiologist: Sanda Klein, MD   Subjective   Jerry Shaffer upset about being in the hospital  Inpatient Medications    Scheduled Meds:  atorvastatin  80 mg Oral Daily   clopidogrel  75 mg Oral Daily   insulin aspart  0-10 Units Subcutaneous TID WC   nicotine  14 mg Transdermal Daily   Continuous Infusions:  diltiazem (CARDIZEM) infusion 10 mg/hr (09/06/22 1253)   heparin 1,700 Units/hr (09/06/22 1254)   PRN Meds: acetaminophen   Vital Signs    Vitals:   09/06/22 0500 09/06/22 0508 09/06/22 0744 09/06/22 1048  BP: 108/83 128/70 (!) 124/58 (!) 142/90  Pulse: 77 81 97 83  Resp: (!) $RemoveB'21 20 18   'VzOiMDKt$ Temp:  98.1 F (36.7 C) 98.6 F (37 C)   TempSrc:  Oral Oral   SpO2: 95% 96%  97%  Weight:  117.3 kg    Height:  $Remove'5\' 8"'evMXRIE$  (1.727 m)      Intake/Output Summary (Last 24 hours) at 09/06/2022 1531 Last data filed at 09/06/2022 0600 Gross per 24 hour  Intake 356.38 ml  Output --  Net 356.38 ml      09/06/2022    5:08 AM 09/05/2022    7:36 AM 08/26/2022    8:39 AM  Last 3 Weights  Weight (lbs) 258 lb 9.6 oz 258 lb 261 lb  Weight (kg) 117.3 kg 117.028 kg 118.389 kg      Telemetry    Rate controlled atrial flutter - Personally Reviewed  ECG    2:1 AFL - Personally Reviewed  Physical Exam   Vitals:   09/06/22 0744 09/06/22 1048  BP: (!) 124/58 (!) 142/90  Pulse: 97 83  Resp: 18   Temp: 98.6 F (37 C)   SpO2:  97%    GEN: No acute distress.  Lying flat Neck: No JVD Cardiac: RRR, no murmurs, rubs, or gallops.  Respiratory: Clear to auscultation bilaterally. GI: Soft, nontender, non-distended  MS: No edema; No deformity. Neuro:  Nonfocal  Psych: Normal affect   Labs    High Sensitivity Troponin:   Recent Labs  Lab 09/05/22 0728 09/05/22 0935 09/06/22 0030 09/06/22 0226  TROPONINIHS 665* 2,141* 2,369* 1,566*     Chemistry Recent Labs  Lab  09/05/22 0728 09/06/22 0030 09/06/22 0226  NA 137 137  --   K 4.1 4.1  --   CL 107 105  --   CO2 23 23  --   GLUCOSE 307* 226*  --   BUN 15 21*  --   CREATININE 0.72 0.82  --   CALCIUM 8.7* 8.9  --   MG 1.5*  --  1.8  PROT 6.1*  --   --   ALBUMIN 3.8  --   --   AST 23  --   --   ALT 22  --   --   ALKPHOS 98  --   --   BILITOT 0.6  --   --   GFRNONAA >60 >60  --   ANIONGAP 7 9  --     Lipids  Recent Labs  Lab 09/06/22 0226  CHOL 85  TRIG 169*  HDL 23*  LDLCALC 28  CHOLHDL 3.7    Hematology Recent Labs  Lab 09/05/22 0728 09/06/22 0030  WBC 10.6* 11.3*  RBC 5.05 4.95  HGB 14.3 14.1  HCT 43.6 43.1  MCV 86.3 87.1  MCH 28.3 28.5  MCHC 32.8 32.7  RDW 13.4 13.5  PLT 223 261   Thyroid  Recent Labs  Lab 09/06/22 0226  TSH 0.853  FREET4 0.83    BNP Recent Labs  Lab 09/05/22 0728  BNP 101.3*    DDimer No results for input(s): "DDIMER" in the last 168 hours.   Radiology    ECHOCARDIOGRAM COMPLETE  Result Date: 09/06/2022    ECHOCARDIOGRAM REPORT   Patient Name:   Jerry Shaffer Date of Exam: 09/06/2022 Medical Rec #:  [id number]       Height:       68.0 in Accession #:    [id number]      Weight:       258.6 lb Date of Birth:  12/15/1961      BSA:          2.280 m Patient Age:    61 years        BP:           124/58 mmHg Patient Gender: M               HR:           88 bpm. Exam Location:  Inpatient Procedure: 2D Echo Indications:    atrial flutter  History:        Patient has prior history of Echocardiogram examinations, most                 recent 09/20/2020. CAD; Risk Factors:Diabetes, Hypertension and                 Dyslipidemia.  Sonographer:    Johny Chess RDCS Referring Phys: [id number] Hershal Coria  Sonographer Comments: Technically difficult study due to poor echo windows. IMPRESSIONS  1. Left ventricular ejection fraction, by estimation, is 60 to 65%. The left ventricle has normal function. The left ventricle has no regional wall motion  abnormalities. There is mild left ventricular hypertrophy. Left ventricular diastolic parameters are indeterminate.  2. Right ventricular systolic function is normal. The right ventricular size is normal.  3. Left atrial size was mildly dilated.  4. The mitral valve is normal in structure. No evidence of mitral valve regurgitation. No evidence of mitral stenosis.  5. The aortic valve was not well visualized. Aortic valve regurgitation is not visualized. No aortic stenosis is present.  6. The inferior vena cava is normal in size with greater than 50% respiratory variability, suggesting right atrial pressure of 3 mmHg. FINDINGS  Left Ventricle: Left ventricular ejection fraction, by estimation, is 60 to 65%. The left ventricle has normal function. The left ventricle has no regional wall motion abnormalities. The left ventricular internal cavity size was normal in size. There is  mild left ventricular hypertrophy. Left ventricular diastolic parameters are indeterminate. Right Ventricle: The right ventricular size is normal. No increase in right ventricular wall thickness. Right ventricular systolic function is normal. Left Atrium: Left atrial size was mildly dilated. Right Atrium: Right atrial size was normal in size. Pericardium: There is no evidence of pericardial effusion. Mitral Valve: The mitral valve is normal in structure. No evidence of mitral valve regurgitation. No evidence of mitral valve stenosis. Tricuspid Valve: The tricuspid valve is normal in structure. Tricuspid valve regurgitation is not demonstrated. No evidence of tricuspid stenosis. Aortic Valve: The aortic valve was not well visualized. Aortic valve regurgitation is not visualized. No aortic stenosis is present. Pulmonic Valve: The pulmonic valve was normal in structure. Pulmonic valve regurgitation is not  visualized. No evidence of pulmonic stenosis. Aorta: The aortic root is normal in size and structure. Venous: The inferior vena cava is normal  in size with greater than 50% respiratory variability, suggesting right atrial pressure of 3 mmHg. IAS/Shunts: No atrial level shunt detected by color flow Doppler.  LEFT VENTRICLE PLAX 2D LVIDd:         4.40 cm LVIDs:         2.80 cm LV PW:         1.40 cm LV IVS:        1.40 cm LVOT diam:     1.90 cm LV SV:         44 LV SV Index:   19 LVOT Area:     2.84 cm  RIGHT VENTRICLE         IVC TAPSE (M-mode): 1.9 cm  IVC diam: 1.60 cm LEFT ATRIUM             Index        RIGHT ATRIUM           Index LA diam:        4.00 cm 1.75 cm/m   RA Area:     15.20 cm LA Vol (A2C):   53.9 ml 23.64 ml/m  RA Volume:   37.70 ml  16.54 ml/m LA Vol (A4C):   77.3 ml 33.91 ml/m LA Biplane Vol: 67.5 ml 29.61 ml/m  AORTIC VALVE LVOT Vmax:   79.40 cm/s LVOT Vmean:  52.000 cm/s LVOT VTI:    0.155 m  AORTA Ao Root diam: 3.50 cm MITRAL VALVE MV Area (PHT): 4.60 cm     SHUNTS MV Decel Time: 165 msec     Systemic VTI:  0.16 m MV E velocity: 108.00 cm/s  Systemic Diam: 1.90 cm MV A velocity: 61.90 cm/s MV E/A ratio:  1.74 Jenkins Rouge MD Electronically signed by Jenkins Rouge MD Signature Date/Time: 09/06/2022/11:57:08 AM    Final    DG Chest Port 1 View  Result Date: 09/06/2022 CLINICAL DATA:  Chest pain. EXAM: PORTABLE CHEST 1 VIEW COMPARISON:  September 05, 2022 FINDINGS: The heart size and mediastinal contours are within normal limits. Both lungs are clear. The visualized skeletal structures are unremarkable. IMPRESSION: No active cardiopulmonary disease. Electronically Signed   By: Virgina Norfolk M.D.   On: 09/06/2022 01:07   CT Angio Chest Pulmonary Embolism (PE) W or WO Contrast  Result Date: 09/05/2022 CLINICAL DATA:  Chest pain and shortness of breath, new onset flutter EXAM: CT ANGIOGRAPHY CHEST WITH CONTRAST TECHNIQUE: Multidetector CT imaging of the chest was performed using the standard protocol during bolus administration of intravenous contrast. Multiplanar CT image reconstructions and MIPs were obtained to evaluate  the vascular anatomy. RADIATION DOSE REDUCTION: This exam was performed according to the departmental dose-optimization program which includes automated exposure control, adjustment of the mA and/or kV according to patient size and/or use of iterative reconstruction technique. CONTRAST:  40mL OMNIPAQUE IOHEXOL 350 MG/ML SOLN COMPARISON:  09/05/2022, 01/27/2022 FINDINGS: Cardiovascular: Pulmonary arteries are normal in caliber and patent. No significant filling defect or pulmonary embolus by CT. normal heart size. No pericardial effusion. Native coronary atherosclerosis noted. Aorta atherosclerotic. No aneurysm or dissection. No mediastinal hemorrhage or hematoma. Central venous structures are patent.  No veno-occlusive finding. Mediastinum/Nodes: No enlarged mediastinal, hilar, or axillary lymph nodes. Thyroid gland, trachea, and esophagus demonstrate no significant findings. Lungs/Pleura: Mild bibasilar posterior dependent atelectasis. Trace right pleural effusion. Trachea and central airways are patent.  No pneumothorax. Upper Abdomen: No acute upper abdominal finding. Colonic diverticulosis noted. Musculoskeletal: Degenerative changes throughout the spine. No chest wall soft tissue abnormality or asymmetry. Review of the MIP images confirms the above findings. IMPRESSION: Negative for significant acute pulmonary embolus by CTA. Bibasilar atelectasis and trace right pleural effusion. Native coronary atherosclerosis Aortic Atherosclerosis (ICD10-I70.0). Electronically Signed   By: Jerilynn Mages.  Shick M.D.   On: 09/05/2022 10:37   DG Chest Portable 1 View  Result Date: 09/05/2022 CLINICAL DATA:  Atrial fibrillation with rapid ventricular response. EXAM: PORTABLE CHEST 1 VIEW COMPARISON:  PA single view 01/03/2022 and chest CT 01/27/2022 FINDINGS: There is an electrical pad superimposing over the left upper to mid lung region and overlying monitor wires. The heart silhouette is enlarged and projects larger than previously.  The central vessels are prominent but no overt edema is seen. There is a stable mediastinum with mild aortic tortuosity and atherosclerosis. Lungs are hypoinflated but generally clear, although with limited view of the lung bases. Thoracic spondylosis. IMPRESSION: 1. Cardiomegaly with central vascular prominence without overt edema. 2. Hypoinflated chest with no acute radiographic findings with limited view of the bases. Electronically Signed   By: Telford Nab M.D.   On: 09/05/2022 07:32    Cardiac Studies   ECHO 09/20/2020  1. Left ventricular ejection fraction, by estimation, is 55 to 60%. The  left ventricle has normal function. The left ventricle has no regional  wall motion abnormalities. There is severe left ventricular hypertrophy.  Left ventricular diastolic parameters   are consistent with Grade I diastolic dysfunction (impaired relaxation).   2. Right ventricular systolic function is normal. The right ventricular  size is normal. Tricuspid regurgitation signal is inadequate for assessing  PA pressure.   3. The mitral valve is normal in structure. No evidence of mitral valve  regurgitation. No evidence of mitral stenosis.   4. The aortic valve is tricuspid. Aortic valve regurgitation is not  visualized. No aortic stenosis is present.   5. Aortic dilatation noted. There is mild dilatation of the aortic root,  measuring 38 mm.   6. The inferior vena cava is normal in size with greater than 50%  respiratory variability, suggesting right atrial pressure of 3 mmHg.    Cath 09/20/2020 Total occlusion of the mid PDA (a large vessel that extends to the left ventricular apex), filling sluggishly by apical LAD to PDA collaterals.  This vessel is felt to represent the culprit for the patient's presentation. Total occlusion of the mid PDA reduced to 0% using an 18 x 2.75 mm Onyx deployed at 12 atm x 2 inflations.  TIMI grade III flow was noted.  Resolution of chest pain occurred.  Proximal to  the stented segment there is a 30 to 40% stenosis in the PDA. Left main is widely patent LAD contains mid vessel disease with a [place] 011 bifurcation stenosis.  LAD is 70% and diagonal is 40%.  This can be managed medically. Circumflex contains segmental 40 to 50% stenosis in the large second obtuse marginal. RCA is dominant, tortuous, and no significant disease other than that noted above in the PDA. Mid inferior wall to apical akinesis.  LVEDP 6 mmHg.  EF 45 to 50%.   Cath 03/28/21 1.  Patent left main 2.  Severe stenosis of the proximal LAD, new from cardiac catheterization 6 months ago, treated successfully with PCI using a 3.5 x 20 mm Synergy DES.  The mid LAD is unchanged from the prior study with [facility]  0, 1, 1 bifurcation disease with only moderate stenosis in the LAD and mild stenosis in the diagonal 3.  Moderate 50% mid circumflex stenosis 4.  Continued patency of the right PDA stent with mild diffuse nonobstructive PDA and RCA stenosis 5.  Normal LVEDP    Patient Profile     Jerry Shaffer is a 61 y.o. male with a PMH of CAD s/p NSTEMI w/ PCI to PDA (09/20/20) s/p PCI to pLAD (03/28/21), HTN, HLD, uncontrolled DM2, TIA and morbid obesity who is being seen 09/06/2022 for the evaluation of atrial flutter with RVR  Assessment & Plan    Atrial flutter: DCCV failed 09/05/2022 in the ED. Converted from 2:1 AV flutter to fib. Now back in flutter. Would discuss CTI ablation. Chads2vasc=5 -continue dilt gtt -continue heparin gtt  Type 2 Demand:  trop 2,141 to 1,566. C/f demand ischemia in the setting of DCCV. However with hx of CAD, recommending FU ischemic eval -consider outpatient ischemic eval.  #CAD s/p PCIs #HLD -Stopped ASA and lieu of DOAC -Continue Plavix 75 Mg daily -Continue atorvastatin -Lipid panel as above -Consider ischemic evaluation while inpatient   #DM2 -Hold oral DM2 medications while inpatient -Start SSI + POC glucose checks   #Tobacco Use -Nicotine  patch  For questions or updates, please contact [place] Please consult [email or web address] for contact info under        Signed, Janina Mayo, MD  09/06/2022, 3:31 PM

## 2022-09-06 NOTE — Progress Notes (Signed)
ANTICOAGULATION CONSULT NOTE - Follow Up  Pharmacy Consult for Heparin Indication: atrial fibrillation  Allergies  Allergen Reactions   Bee Venom Anaphylaxis   Penicillins Anaphylaxis    Did it involve swelling of the face/tongue/throat, SOB, or low BP? yes Did it involve sudden or severe rash/hives, skin peeling, or any reaction on the inside of your mouth or nose? unknown Did you need to seek medical attention at a hospital or doctor's office? yes When did it last happen?      1967 If all above answers are "NO", may proceed with cephalosporin use.    Procaine Anaphylaxis   Isosorbide Other (See Comments)    Caused migraines   Tape Itching and Other (See Comments)    Certain medical tapes make the patient's skin [facility]    Patient Measurements: Height: '5\' 8"'$  (172.7 cm) Weight: 117.3 kg (258 lb 9.6 oz) IBW/kg (Calculated) : 68.4 Heparin Dosing Weight: 95 kg  Vital Signs: Temp: 98.7 F (37.1 C) (09/23 1953) Temp Source: Oral (09/23 1953) BP: 132/90 (09/23 1953) Pulse Rate: 82 (09/23 1953)  Labs: Recent Labs    09/05/22 0728 09/05/22 0935 09/06/22 0030 09/06/22 0226 09/06/22 1010 09/06/22 2013  HGB 14.3  --  14.1  --   --   --   HCT 43.6  --  43.1  --   --   --   PLT 223  --  261  --   --   --   APTT  --   --   --   --  50*  --   HEPARINUNFRC  --   --   --   --  <0.10* 0.19*  CREATININE 0.72  --  0.82  --   --   --   TROPONINIHS 665* 2,141* 2,369* 1,566*  --   --      Estimated Creatinine Clearance: 120.7 mL/min (by C-G formula based on SCr of 0.82 mg/dL).   Assessment: 45 YOM presented with SOB and CP on 9/22, found to be in [place].  It is unclear whether patient received Eliquis prior to discharge (9/22's MAR did not show Eliquis was charted as given).  Patient returned 9/23 with similar symptoms.  Pharmacy consulted to dose IV Heparin for new-onset Afib.  Renal function and CBC stable.  Heparin level came back subtherapeutic at 0.19, on 1700 units/hr. No  s/sx of bleeding or infusion issues.  Goal of Therapy:  Heparin level 0.3-0.7 units/ml Monitor platelets by anticoagulation protocol: Yes   Plan:  Increase heparin infusion to 2000 units/hr Check 6 hr heparin level with AM labs  Daily heparin level and CBC  Antonietta Jewel, PharmD, [place] Pharmacist  Phone: [phone] 09/06/2022 9:50 PM  Please check AMION for all [place] phone numbers After 10:00 PM, call [place] [phone]

## 2022-09-06 NOTE — ED Triage Notes (Signed)
Patient reports central chest pain with palpitations/AFIB onset last night , HR=155.min at triage , mild SOB and diaphoresis , no emesis or fever . History of CAD w/ stents .

## 2022-09-06 NOTE — Progress Notes (Addendum)
ANTICOAGULATION CONSULT NOTE - Initial Consult  Pharmacy Consult for Heparin Indication: atrial fibrillation  Allergies  Allergen Reactions   Bee Venom Anaphylaxis   Penicillins Anaphylaxis    Did it involve swelling of the face/tongue/throat, SOB, or low BP? yes Did it involve sudden or severe rash/hives, skin peeling, or any reaction on the inside of your mouth or nose? unknown Did you need to seek medical attention at a hospital or doctor's office? yes When did it last happen?      1967 If all above answers are "NO", may proceed with cephalosporin use.    Procaine Anaphylaxis   Isosorbide Other (See Comments)    Caused migraines   Tape Itching and Other (See Comments)    Certain medical tapes make the patient's skin [facility]    Patient Measurements:   Heparin Dosing Weight: 95 kg  Vital Signs: Temp: 98.5 F (36.9 C) (09/23 0028) Temp Source: Oral (09/23 0028) BP: 128/101 (09/23 0215) Pulse Rate: 152 (09/23 0215)  Labs: Recent Labs    09/05/22 0728 09/05/22 0935 09/06/22 0030  HGB 14.3  --  14.1  HCT 43.6  --  43.1  PLT 223  --  261  CREATININE 0.72  --  0.82  TROPONINIHS 665* 2,141* 2,369*    Estimated Creatinine Clearance: 120.5 mL/min (by C-G formula based on SCr of 0.82 mg/dL).   Assessment: 54 YOM presented with SOB and CP on 9/22, found to be in [place].  It is unclear whether patient received Eliquis prior to discharge (9/22's MAR did not show Eliquis was charted as given).  Patient returned 9/23 with similar symptoms.  Pharmacy consulted to dose IV Heparin for new-onset Afib.  Renal function and CBC stable.  Goal of Therapy:  Heparin level 0.3-0.7 units/ml Monitor platelets by anticoagulation protocol: Yes   Plan:  Reduce heparin bolus 3000 units IV, then IV heparin at 1400 units/hr Check 6 hr heparin level and aPTT Daily heparin level, aPTT and CBC  Margarethe Virgen D. Mina Marble, PharmD, BCPS, [place] 09/06/2022, 2:49 AM

## 2022-09-06 NOTE — ED Notes (Signed)
ED TO INPATIENT HANDOFF REPORT  ED Nurse Name and Phone #: Novalyn Lajara [phone]  S Name/Age/Gender Jerry Shaffer 61 y.o. male Room/Bed: 022C/022C  Code Status   Code Status: Full Code  Home/SNF/Other Home Patient oriented to: self, place, time, and situation Is this baseline? Yes   Triage Complete: Triage complete  Chief Complaint Atrial flutter with rapid ventricular response ([place]) [I48.92]  Triage Note Patient reports central chest pain with palpitations/AFIB onset last night , HR=155.min at triage , mild SOB and diaphoresis , no emesis or fever . History of CAD w/ stents .    Allergies Allergies  Allergen Reactions   Bee Venom Anaphylaxis   Penicillins Anaphylaxis    Did it involve swelling of the face/tongue/throat, SOB, or low BP? yes Did it involve sudden or severe rash/hives, skin peeling, or any reaction on the inside of your mouth or nose? unknown Did you need to seek medical attention at a hospital or doctor's office? yes When did it last happen?      1966 If all above answers are "NO", may proceed with cephalosporin use.    Procaine Anaphylaxis   Isosorbide Other (See Comments)    Caused migraines   Tape Itching and Other (See Comments)    Certain medical tapes make the patient's skin ITCH    Level of Care/Admitting Diagnosis ED Disposition     ED Disposition  Admit   Condition  --   [place]: [place] [[id number]]  Level of Care: Progressive [102]  Admit to Progressive based on following criteria: CARDIOVASCULAR & THORACIC of moderate stability with acute coronary syndrome symptoms/low risk myocardial infarction/hypertensive urgency/arrhythmias/heart failure potentially compromising stability and stable post cardiovascular intervention patients.  May admit patient to [facility] or [facility] if equivalent level of care is available:: No  Covid Evaluation: Asymptomatic - no recent exposure (last 10 days) testing not  required  Diagnosis: Atrial flutter with rapid ventricular response [facility]) [[id number]]  Admitting Physician: Leona Carry  Attending Physician: Hershal Coria [[id number]]  Certification:: I certify this patient will need inpatient services for at least 2 midnights  Estimated Length of Stay: 3          B Medical/Surgery History Past Medical History:  Diagnosis Date   Bipolar 1 disorder ([place])    Coronary artery disease    Diabetes mellitus    Dyslipidemia    Emphysema    Mental disorder    BIPOLAR DISORDER   Migraine    NSTEMI (non-ST elevated myocardial infarction) ([place]) 09/20/2020   PONV (postoperative nausea and vomiting)    Renal disorder    Shortness of breath    Stroke ([place])    Transient ischemic attack (TIA)    Past Surgical History:  Procedure Laterality Date   COLONOSCOPY WITH PROPOFOL N/A 10/17/2021   Procedure: COLONOSCOPY WITH PROPOFOL;  Surgeon: Milus Banister, MD;  Location: [facility];  Service: Endoscopy;  Laterality: N/A;   CORONARY ANGIOGRAPHY  09/20/2020   CORONARY STENT INTERVENTION  09/20/2020   CORONARY STENT INTERVENTION   CORONARY STENT INTERVENTION N/A 09/20/2020   Procedure: CORONARY STENT INTERVENTION;  Surgeon: Belva Crome, MD;  Location: [place] [facility];  Service: Cardiovascular;  Laterality: N/A;   CORONARY STENT INTERVENTION N/A 03/28/2021   Procedure: CORONARY STENT INTERVENTION;  Surgeon: [name], MD;  Location: [place] CV LAB;  Service: Cardiovascular;  Laterality: N/A;   HEMORROIDECTOMY     KNEE ARTHROSCOPY  LEFT HEART CATH AND CORONARY ANGIOGRAPHY N/A 09/20/2020   Procedure: LEFT HEART CATH AND CORONARY ANGIOGRAPHY;  Surgeon: Belva Crome, MD;  Location: [place] [facility];  Service: Cardiovascular;  Laterality: N/A;   LEFT HEART CATH AND CORONARY ANGIOGRAPHY N/A 03/28/2021   Procedure: LEFT HEART CATH AND CORONARY ANGIOGRAPHY;  Surgeon: [name], MD;  Location: [place] [facility];  Service:  Cardiovascular;  Laterality: N/A;   LUMBAR DISC SURGERY     POLYPECTOMY  10/17/2021   Procedure: POLYPECTOMY;  Surgeon: Milus Banister, MD;  Location: [facility];  Service: Endoscopy;;   TONSILLECTOMY AND ADENOIDECTOMY       A IV Location/Drains/Wounds Patient Lines/Drains/Airways Status     Active Line/Drains/Airways     Name Placement date Placement time Site Days   Peripheral IV 09/06/22 18 G Left Antecubital 09/06/22  0050  Antecubital  less than 1   Peripheral IV 09/06/22 20 G Left Hand 09/06/22  0149  Hand  less than 1            Intake/Output Last 24 hours No intake or output data in the 24 hours ending 09/06/22 0311  Labs/Imaging Results for orders placed or performed during the hospital encounter of 09/06/22 (from the past 48 hour(s))  Basic metabolic panel     Status: Abnormal   Collection Time: 09/06/22 12:30 AM  Result Value Ref Range   Sodium 137 135 - 145 mmol/L   Potassium 4.1 3.5 - 5.1 mmol/L   Chloride 105 98 - 111 mmol/L   CO2 23 22 - 32 mmol/L   Glucose, Bld 226 (H) 70 - 99 mg/dL    Comment: Glucose reference range applies only to samples taken after fasting for at least 8 hours.   BUN 21 (H) 6 - 20 mg/dL   Creatinine, Ser 0.82 0.61 - 1.24 mg/dL   Calcium 8.9 8.9 - 10.3 mg/dL   GFR, Estimated >60 >60 mL/min    Comment: (NOTE) Calculated using the CKD-EPI Creatinine Equation (2021)    Anion gap 9 5 - 15    Comment: Performed at [facility] [address] [place], [place] [address]  CBC     Status: Abnormal   Collection Time: 09/06/22 12:30 AM  Result Value Ref Range   WBC 11.3 (H) 4.0 - 10.5 K/uL   RBC 4.95 4.22 - 5.81 MIL/uL   Hemoglobin 14.1 13.0 - 17.0 g/dL   HCT 43.1 39.0 - 52.0 %   MCV 87.1 80.0 - 100.0 fL   MCH 28.5 26.0 - 34.0 pg   MCHC 32.7 30.0 - 36.0 g/dL   RDW 13.5 11.5 - 15.5 %   Platelets 261 150 - 400 K/uL   nRBC 0.0 0.0 - 0.2 %    Comment: Performed at [facility], [place] [address] [place], [place] [address]   Troponin I (High Sensitivity)     Status: Abnormal   Collection Time: 09/06/22 12:30 AM  Result Value Ref Range   Troponin I (High Sensitivity) 2,369 (HH) <18 ng/L    Comment: CRITICAL VALUE NOTED.  VALUE IS CONSISTENT WITH PREVIOUSLY REPORTED AND CALLED VALUE. (NOTE) Elevated high sensitivity troponin I (hsTnI) values and significant  changes across serial measurements may suggest ACS but many other  chronic and acute conditions are known to elevate hsTnI results.  Refer to the "Links" section for chest pain algorithms and additional  guidance. Performed at [facility], [place] [address], [place],  [address]  DG Chest Port 1 View  Result Date: 09/06/2022 CLINICAL DATA:  Chest pain. EXAM: PORTABLE CHEST 1 VIEW COMPARISON:  September 05, 2022 FINDINGS: The heart size and mediastinal contours are within normal limits. Both lungs are clear. The visualized skeletal structures are unremarkable. IMPRESSION: No active cardiopulmonary disease. Electronically Signed   By: Virgina Norfolk M.D.   On: 09/06/2022 01:07   CT Angio Chest Pulmonary Embolism (PE) W or WO Contrast  Result Date: 09/05/2022 CLINICAL DATA:  Chest pain and shortness of breath, new onset flutter EXAM: CT ANGIOGRAPHY CHEST WITH CONTRAST TECHNIQUE: Multidetector CT imaging of the chest was performed using the standard protocol during bolus administration of intravenous contrast. Multiplanar CT image reconstructions and MIPs were obtained to evaluate the vascular anatomy. RADIATION DOSE REDUCTION: This exam was performed according to the departmental dose-optimization program which includes automated exposure control, adjustment of the mA and/or kV according to patient size and/or use of iterative reconstruction technique. CONTRAST:  51m OMNIPAQUE IOHEXOL 350 MG/ML SOLN COMPARISON:  09/05/2022, 01/27/2022 FINDINGS: Cardiovascular: Pulmonary arteries are normal in caliber and patent. No significant filling defect or  pulmonary embolus by CT. normal heart size. No pericardial effusion. Native coronary atherosclerosis noted. Aorta atherosclerotic. No aneurysm or dissection. No mediastinal hemorrhage or hematoma. Central venous structures are patent.  No veno-occlusive finding. Mediastinum/Nodes: No enlarged mediastinal, hilar, or axillary lymph nodes. Thyroid gland, trachea, and esophagus demonstrate no significant findings. Lungs/Pleura: Mild bibasilar posterior dependent atelectasis. Trace right pleural effusion. Trachea and central airways are patent. No pneumothorax. Upper Abdomen: No acute upper abdominal finding. Colonic diverticulosis noted. Musculoskeletal: Degenerative changes throughout the spine. No chest wall soft tissue abnormality or asymmetry. Review of the MIP images confirms the above findings. IMPRESSION: Negative for significant acute pulmonary embolus by CTA. Bibasilar atelectasis and trace right pleural effusion. Native coronary atherosclerosis Aortic Atherosclerosis (ICD10-I70.0). Electronically Signed   By: MJerilynn Mages  Shick M.D.   On: 09/05/2022 10:37   DG Chest Portable 1 View  Result Date: 09/05/2022 CLINICAL DATA:  Atrial fibrillation with rapid ventricular response. EXAM: PORTABLE CHEST 1 VIEW COMPARISON:  PA single view 01/03/2022 and chest CT 01/27/2022 FINDINGS: There is an electrical pad superimposing over the left upper to mid lung region and overlying monitor wires. The heart silhouette is enlarged and projects larger than previously. The central vessels are prominent but no overt edema is seen. There is a stable mediastinum with mild aortic tortuosity and atherosclerosis. Lungs are hypoinflated but generally clear, although with limited view of the lung bases. Thoracic spondylosis. IMPRESSION: 1. Cardiomegaly with central vascular prominence without overt edema. 2. Hypoinflated chest with no acute radiographic findings with limited view of the bases. Electronically Signed   By: KTelford NabM.D.    On: 09/05/2022 07:32    Pending Labs Unresulted Labs (From admission, onward)     Start     Ordered   09/07/22 0500  Heparin level (unfractionated)  Daily at 5am,   R      09/06/22 0252   09/07/22 0500  APTT  Daily at 5am,   R      09/06/22 0252   09/07/22 0500  CBC  Daily at 5am,   R      09/06/22 0252   09/06/22 1000  APTT  Once-Timed,   STAT        09/06/22 0252   09/06/22 1000  Heparin level (unfractionated)  Once-Timed,   URGENT        09/06/22 0252   09/06/22 0310  Magnesium  Add-on,   AD        09/06/22 0309   09/06/22 0305  Hemoglobin A1c  Add-on,   AD        09/06/22 0308   09/06/22 0305  Lipid panel  Add-on,   AD        09/06/22 0308   09/06/22 0259  HIV Antibody (routine testing w rflx)  (HIV Antibody (Routine testing w reflex) panel)  Once,   R        09/06/22 0301   09/06/22 0217  TSH  Add-on,   AD        09/06/22 0216   09/06/22 0217  T4, free  Add-on,   AD        09/06/22 0216            Vitals/Pain Today's Vitals   09/06/22 0200 09/06/22 0215 09/06/22 0245 09/06/22 0258  BP: 125/83 (!) 128/101 (!) 129/94   Pulse: (!) 152 (!) 152  71  Resp:  (!) 23 (!) 23 (!) 24  Temp:      TempSrc:      SpO2: 95% 95% 97% 94%  PainSc:        Isolation Precautions No active isolations  Medications Medications  diltiazem (CARDIZEM) 1 mg/mL load via infusion 10 mg (10 mg Intravenous Bolus from Bag 09/06/22 0126)    And  diltiazem (CARDIZEM) 125 mg in dextrose 5% 125 mL (1 mg/mL) infusion (10 mg/hr Intravenous Rate/Dose Change 09/06/22 0158)  heparin ADULT infusion 100 units/mL (25000 units/241m) (1,400 Units/hr Intravenous New Bag/Given 09/06/22 0307)  acetaminophen (TYLENOL) tablet 650 mg (has no administration in time range)  atorvastatin (LIPITOR) tablet 80 mg (has no administration in time range)  clopidogrel (PLAVIX) tablet 75 mg (has no administration in time range)  nicotine (NICODERM CQ - dosed in mg/24 hours) patch 14 mg (has no administration in time  range)  insulin aspart (novoLOG) injection 0-10 Units (has no administration in time range)  heparin bolus via infusion 3,000 Units (3,000 Units Intravenous Bolus from Bag 09/06/22 0308)    Mobility walks with device Low fall risk   Focused Assessments Cardiac Assessment Handoff:    Lab Results  Component Value Date   CKTOTAL 104 07/21/2011   CKMB 2.3 07/21/2011   TROPONINI <0.30 07/21/2011   Lab Results  Component Value Date   DDIMER <0.27 11/19/2020   Does the Patient currently have chest pain? No    R Recommendations: See Admitting Provider Note  Report given to:   Additional Notes:

## 2022-09-06 NOTE — ED Provider Notes (Signed)
[facility] EMERGENCY DEPARTMENT Provider Note   CSN: [id number] Arrival date & time: 09/06/22  0016     History  Chief Complaint  Patient presents with   Afib/Chest Pain     Jerry Shaffer is a 61 y.o. male.  Patient returns to the emergency department with sudden onset chest pressure, shortness of breath, diaphoresis.  Patient reports similar symptoms yesterday when he was brought to the ED.  At that time he was found to be in new onset atrial fibrillation.  Patient reports that he was discharged this morning and told to return for any recurrent symptoms.       Home Medications Prior to Admission medications   Medication Sig Start Date End Date Taking? Authorizing Provider  apixaban (ELIQUIS) 5 MG TABS tablet Take 1 tablet (5 mg total) by mouth 2 (two) times daily. 09/05/22   Nooruddin, Marlene Lard, MD  atorvastatin (LIPITOR) 80 MG tablet TAKE 1 TABLET BY MOUTH EVERY DAY 07/07/22   Lendon Colonel, NP  carvedilol (COREG) 12.5 MG tablet Take 1 tablet (12.5 mg total) by mouth 2 (two) times daily with a meal. 09/05/22   Nooruddin, Marlene Lard, MD  clopidogrel (PLAVIX) 75 MG tablet TAKE 1 TABLET(75 MG) BY MOUTH DAILY WITH BREAKFAST 08/13/22   Croitoru, Mihai, MD  glipiZIDE (GLUCOTROL) 5 MG tablet Take 1 tablet (5 mg total) by mouth daily before breakfast. 08/26/22   Philemon Kingdom, MD  metFORMIN (GLUCOPHAGE-XR) 500 MG 24 hr tablet Take 4 tablets (2,000 mg total) by mouth daily. 08/26/22   Philemon Kingdom, MD      Allergies    Bee venom, Penicillins, Procaine, Isosorbide, and Tape    Review of Systems   Review of Systems  Physical Exam Updated Vital Signs BP 125/83   Pulse (!) 152   Temp 98.5 F (36.9 C) (Oral)   Resp 11   SpO2 95%  Physical Exam Vitals and nursing note reviewed.  Constitutional:      General: He is not in acute distress.    Appearance: He is well-developed. He is diaphoretic.  HENT:     Head: Normocephalic and atraumatic.     Mouth/Throat:      Mouth: Mucous membranes are moist.  Eyes:     General: Vision grossly intact. Gaze aligned appropriately.     Extraocular Movements: Extraocular movements intact.     Conjunctiva/sclera: Conjunctivae normal.  Cardiovascular:     Rate and Rhythm: Regular rhythm. Tachycardia present.     Pulses: Normal pulses.     Heart sounds: Normal heart sounds, S1 normal and S2 normal. No murmur heard.    No friction rub. No gallop.  Pulmonary:     Effort: Pulmonary effort is normal. No respiratory distress.     Breath sounds: Normal breath sounds.  Abdominal:     Palpations: Abdomen is soft.     Tenderness: There is no abdominal tenderness. There is no guarding or rebound.     Hernia: No hernia is present.  Musculoskeletal:        General: No swelling.     Cervical back: Full passive range of motion without pain, normal range of motion and neck supple. No pain with movement, spinous process tenderness or muscular tenderness. Normal range of motion.     Right lower leg: No edema.     Left lower leg: No edema.  Skin:    General: Skin is warm.     Capillary Refill: Capillary refill takes less than 2 seconds.  Findings: No ecchymosis, erythema, lesion or wound.  Neurological:     Mental Status: He is alert and oriented to person, place, and time.     GCS: GCS eye subscore is 4. GCS verbal subscore is 5. GCS motor subscore is 6.     Cranial Nerves: Cranial nerves 2-12 are intact.     Sensory: Sensation is intact.     Motor: Motor function is intact. No weakness or abnormal muscle tone.     Coordination: Coordination is intact.  Psychiatric:        Mood and Affect: Mood normal.        Speech: Speech normal.        Behavior: Behavior normal.     ED Results / Procedures / Treatments   Labs (all labs ordered are listed, but only abnormal results are displayed) Labs Reviewed  BASIC METABOLIC PANEL - Abnormal; Notable for the following components:      Result Value   Glucose, Bld 226 (*)     BUN 21 (*)    All other components within normal limits  CBC - Abnormal; Notable for the following components:   WBC 11.3 (*)    All other components within normal limits  TROPONIN I (HIGH SENSITIVITY) - Abnormal; Notable for the following components:   Troponin I (High Sensitivity) 2,369 (*)    All other components within normal limits  TSH  T4, FREE  TROPONIN I (HIGH SENSITIVITY)    EKG None  Radiology DG Chest Port 1 View  Result Date: 09/06/2022 CLINICAL DATA:  Chest pain. EXAM: PORTABLE CHEST 1 VIEW COMPARISON:  September 05, 2022 FINDINGS: The heart size and mediastinal contours are within normal limits. Both lungs are clear. The visualized skeletal structures are unremarkable. IMPRESSION: No active cardiopulmonary disease. Electronically Signed   By: Virgina Norfolk M.D.   On: 09/06/2022 01:07   CT Angio Chest Pulmonary Embolism (PE) W or WO Contrast  Result Date: 09/05/2022 CLINICAL DATA:  Chest pain and shortness of breath, new onset flutter EXAM: CT ANGIOGRAPHY CHEST WITH CONTRAST TECHNIQUE: Multidetector CT imaging of the chest was performed using the standard protocol during bolus administration of intravenous contrast. Multiplanar CT image reconstructions and MIPs were obtained to evaluate the vascular anatomy. RADIATION DOSE REDUCTION: This exam was performed according to the departmental dose-optimization program which includes automated exposure control, adjustment of the mA and/or kV according to patient size and/or use of iterative reconstruction technique. CONTRAST:  41m OMNIPAQUE IOHEXOL 350 MG/ML SOLN COMPARISON:  09/05/2022, 01/27/2022 FINDINGS: Cardiovascular: Pulmonary arteries are normal in caliber and patent. No significant filling defect or pulmonary embolus by CT. normal heart size. No pericardial effusion. Native coronary atherosclerosis noted. Aorta atherosclerotic. No aneurysm or dissection. No mediastinal hemorrhage or hematoma. Central venous structures  are patent.  No veno-occlusive finding. Mediastinum/Nodes: No enlarged mediastinal, hilar, or axillary lymph nodes. Thyroid gland, trachea, and esophagus demonstrate no significant findings. Lungs/Pleura: Mild bibasilar posterior dependent atelectasis. Trace right pleural effusion. Trachea and central airways are patent. No pneumothorax. Upper Abdomen: No acute upper abdominal finding. Colonic diverticulosis noted. Musculoskeletal: Degenerative changes throughout the spine. No chest wall soft tissue abnormality or asymmetry. Review of the MIP images confirms the above findings. IMPRESSION: Negative for significant acute pulmonary embolus by CTA. Bibasilar atelectasis and trace right pleural effusion. Native coronary atherosclerosis Aortic Atherosclerosis (ICD10-I70.0). Electronically Signed   By: MJerilynn Mages  Shick M.D.   On: 09/05/2022 10:37   DG Chest Portable 1 View  Result Date: 09/05/2022 CLINICAL DATA:  Atrial fibrillation with rapid ventricular response. EXAM: PORTABLE CHEST 1 VIEW COMPARISON:  PA single view 01/03/2022 and chest CT 01/27/2022 FINDINGS: There is an electrical pad superimposing over the left upper to mid lung region and overlying monitor wires. The heart silhouette is enlarged and projects larger than previously. The central vessels are prominent but no overt edema is seen. There is a stable mediastinum with mild aortic tortuosity and atherosclerosis. Lungs are hypoinflated but generally clear, although with limited view of the lung bases. Thoracic spondylosis. IMPRESSION: 1. Cardiomegaly with central vascular prominence without overt edema. 2. Hypoinflated chest with no acute radiographic findings with limited view of the bases. Electronically Signed   By: Telford Nab M.D.   On: 09/05/2022 07:32    Procedures Procedures    Medications Ordered in ED Medications  diltiazem (CARDIZEM) 1 mg/mL load via infusion 10 mg (10 mg Intravenous Bolus from Bag 09/06/22 0126)    And  diltiazem  (CARDIZEM) 125 mg in dextrose 5% 125 mL (1 mg/mL) infusion (10 mg/hr Intravenous Rate/Dose Change 09/06/22 0158)    ED Course/ Medical Decision Making/ A&P                           Medical Decision Making Amount and/or Complexity of Data Reviewed Labs: ordered. Radiology: ordered.  Risk Prescription drug management.   Patient returns to the emergency department with recurrent symptoms of chest discomfort, shortness of breath, diaphoresis.  Patient was seen in the ED last night with similar symptoms, found to be in atrial flutter with 2:1 block.  DC cardioversion was attempted in the ED.  He converted from flutter to A-fib.  At that point he was placed on Cardizem drip.  While on the Cardizem drip, he converted to sinus rhythm.  Cards indicate that cardiology saw him this morning and felt that his elevated troponins were secondary to his rapid heart rhythm.  He was not admitted to the hospital, was discharged on Eliquis.  Upon arrival to the emergency department tonight he is in a flutter again.  We will not attempt additional electrical cardioversion as it was unsuccessful last night.  Patient placed back on Cardizem drip.  He did not fill prescriptions at time of discharge, last dose of Eliquis was in the ED before discharge.  Pharmacy to dose heparin.  Discussed with cardiology, will see patient for admission.  CRITICAL CARE Performed by: Orpah Greek   Total critical care time: 30 minutes  Critical care time was exclusive of separately billable procedures and treating other patients.  Critical care was necessary to treat or prevent imminent or life-threatening deterioration.  Critical care was time spent personally by me on the following activities: development of treatment plan with patient and/or surrogate as well as nursing, discussions with consultants, evaluation of patient's response to treatment, examination of patient, obtaining history from patient or surrogate,  ordering and performing treatments and interventions, ordering and review of laboratory studies, ordering and review of radiographic studies, pulse oximetry and re-evaluation of patient's condition.         Final Clinical Impression(s) / ED Diagnoses Final diagnoses:  Atrial fibrillation with [facility])    Rx / DC Orders ED Discharge Orders     None         Tashala Cumbo, Gwenyth Allegra, MD 09/06/22 [phone]

## 2022-09-07 ENCOUNTER — Encounter (HOSPITAL_COMMUNITY): Payer: Self-pay | Admitting: Internal Medicine

## 2022-09-07 DIAGNOSIS — I4892 Unspecified atrial flutter: Secondary | ICD-10-CM | POA: Diagnosis not present

## 2022-09-07 LAB — SURGICAL PCR SCREEN
MRSA, PCR: NEGATIVE
Staphylococcus aureus: NEGATIVE

## 2022-09-07 LAB — CBC
HCT: 47 % (ref 39.0–52.0)
Hemoglobin: 14.9 g/dL (ref 13.0–17.0)
MCH: 27.7 pg (ref 26.0–34.0)
MCHC: 31.7 g/dL (ref 30.0–36.0)
MCV: 87.4 fL (ref 80.0–100.0)
Platelets: 276 10*3/uL (ref 150–400)
RBC: 5.38 MIL/uL (ref 4.22–5.81)
RDW: 13.7 % (ref 11.5–15.5)
WBC: 10.7 10*3/uL — ABNORMAL HIGH (ref 4.0–10.5)
nRBC: 0 % (ref 0.0–0.2)

## 2022-09-07 LAB — GLUCOSE, CAPILLARY
Glucose-Capillary: 364 mg/dL — ABNORMAL HIGH (ref 70–99)
Glucose-Capillary: 225 mg/dL — ABNORMAL HIGH (ref 70–99)
Glucose-Capillary: 197 mg/dL — ABNORMAL HIGH (ref 70–99)
Glucose-Capillary: 235 mg/dL — ABNORMAL HIGH (ref 70–99)

## 2022-09-07 LAB — HEPARIN LEVEL (UNFRACTIONATED): Heparin Unfractionated: 0.45 IU/mL (ref 0.30–0.70)

## 2022-09-07 MED ORDER — HEPARIN BOLUS VIA INFUSION: 3000.0000 [IU] | Freq: Once | INTRAVENOUS | Status: DC

## 2022-09-07 MED ORDER — SODIUM CHLORIDE 0.9 % IV SOLN: INTRAVENOUS | Status: DC

## 2022-09-07 MED ORDER — HEPARIN (PORCINE) 25000 UT/250ML-% IV SOLN: 2000.0000 [IU]/h | INTRAVENOUS | Status: DC

## 2022-09-07 MED ORDER — APIXABAN 5 MG PO TABS
5.0000 mg | ORAL_TABLET | Freq: Two times a day (BID) | ORAL | Status: DC
  Administered 2022-09-07: 5 mg via ORAL
  Filled 2022-09-07: qty 1

## 2022-09-07 NOTE — Consult Note (Signed)
ELECTROPHYSIOLOGY CONSULT NOTE  Patient ID: Jerry Shaffer, MRN: [id number], DOB/AGE: 03-28-1961 61 y.o. Admit date: 09/06/2022 Date of Consult: 09/07/2022  Primary Physician: Patient, No Pcp Per Primary Cardiologist: Jerry Shaffer is a 61 y.o. male who is being seen today for the evaluation of atrial flutter at the request of Dr Thermon Leyland.   Chief Complaint: atrial flutter   HPI Jerry Shaffer is a 61 y.o. male admitted with chest pain diaphoresis.  EMS was called and was found to be in atrial flutter with 2: 1 conduction; because of ongoing chest pain, he was cardioverted in the emergency room with resolution of his chest discomfort.  These episodes also associated with profound diaphoresis.  Thinking back he has had multiple times before not knowing what they are.  Without chest pain or shortness of breath, converted spontaneously last night.  He was started on anticoagulation with apixaban; (apparently never initiated) has been on IV heparin, aspirin discontinued and clopidogrel continued.   History of coronary artery disease with PCI 10/21-RCA in 4/22 LAD  Does not carry a diagnosis of hypertension but blood pressures in the record range up to 172/106  DATE TEST EF   4/22 LHC    % LADp 80>>Stent D2 35 RPDA t --L>>R collaterals RPDA stent patent   9/23 Echo   60-65 % LAE mild             Date Cr K Hgb  9/23 0.82 4.1 14.1         Thromboembolic risk factors ( HTN-1 , TIA/CVA-2, DM-1, Vasc disease -1) for a CHADSVASc Score of >=5  Past Medical History:  Diagnosis Date   Bipolar 1 disorder ([place])    Coronary artery disease    Diabetes mellitus    Dyslipidemia    Emphysema    Mental disorder    BIPOLAR DISORDER   Migraine    NSTEMI (non-ST elevated myocardial infarction) ([place]) 09/20/2020   PONV (postoperative nausea and vomiting)    Renal disorder    Shortness of breath    Stroke ([place])    Transient ischemic attack (TIA)        Surgical History:  Past  Surgical History:  Procedure Laterality Date   COLONOSCOPY WITH PROPOFOL N/A 10/17/2021   Procedure: COLONOSCOPY WITH PROPOFOL;  Surgeon: Milus Banister, MD;  Location: [facility];  Service: Endoscopy;  Laterality: N/A;   CORONARY ANGIOGRAPHY  09/20/2020   CORONARY STENT INTERVENTION  09/20/2020   CORONARY STENT INTERVENTION   CORONARY STENT INTERVENTION N/A 09/20/2020   Procedure: CORONARY STENT INTERVENTION;  Surgeon: Belva Crome, MD;  Location: [facility];  Service: Cardiovascular;  Laterality: N/A;   CORONARY STENT INTERVENTION N/A 03/28/2021   Procedure: CORONARY STENT INTERVENTION;  Surgeon: [name], MD;  Location: [facility];  Service: Cardiovascular;  Laterality: N/A;   HEMORROIDECTOMY     KNEE ARTHROSCOPY     LEFT HEART CATH AND CORONARY ANGIOGRAPHY N/A 09/20/2020   Procedure: LEFT HEART CATH AND CORONARY ANGIOGRAPHY;  Surgeon: Belva Crome, MD;  Location: [facility];  Service: Cardiovascular;  Laterality: N/A;   LEFT HEART CATH AND CORONARY ANGIOGRAPHY N/A 03/28/2021   Procedure: LEFT HEART CATH AND CORONARY ANGIOGRAPHY;  Surgeon: [name], MD;  Location: [facility];  Service: Cardiovascular;  Laterality: N/A;   LUMBAR DISC SURGERY     POLYPECTOMY  10/17/2021   Procedure: POLYPECTOMY;  Surgeon: Milus Banister, MD;  Location: [facility];  Service: Endoscopy;;   TONSILLECTOMY AND ADENOIDECTOMY       Home Meds: Prior to Admission medications   Medication Sig Start Date End Date Taking? Authorizing Provider  apixaban (ELIQUIS) 5 MG TABS tablet Take 1 tablet (5 mg total) by mouth 2 (two) times daily. 09/05/22  Yes Nooruddin, Marlene Lard, MD  atorvastatin (LIPITOR) 80 MG tablet TAKE 1 TABLET BY MOUTH EVERY DAY Patient taking differently: Take 80 mg by mouth daily. 07/07/22  Yes Lendon Colonel, NP  carvedilol (COREG) 6.25 MG tablet Take 6.25 mg by mouth 2 (two) times daily with a meal.   Yes [provider]  clopidogrel  (PLAVIX) 75 MG tablet TAKE 1 TABLET(75 MG) BY MOUTH DAILY WITH BREAKFAST Patient taking differently: Take 75 mg by mouth daily. 08/13/22  Yes Croitoru, Mihai, MD  glipiZIDE (GLUCOTROL) 5 MG tablet Take 1 tablet (5 mg total) by mouth daily before breakfast. 08/26/22  Yes Philemon Kingdom, MD  metFORMIN (GLUCOPHAGE-XR) 500 MG 24 hr tablet Take 4 tablets (2,000 mg total) by mouth daily. 08/26/22  Yes Philemon Kingdom, MD  Semaglutide,0.25 or 0.'5MG'$ /DOS, (OZEMPIC, 0.25 OR 0.5 MG/DOSE,) 2 MG/3ML SOPN Inject 0.5 mg into the skin once a week.   Yes [provider]  carvedilol (COREG) 12.5 MG tablet Take 1 tablet (12.5 mg total) by mouth 2 (two) times daily with a meal. Patient not taking: Reported on 09/06/2022 09/05/22   Nooruddin, Marlene Lard, MD    Inpatient Medications:   atorvastatin  80 mg Oral Daily   clopidogrel  75 mg Oral Daily   insulin aspart  0-10 Units Subcutaneous TID AC & HS   nicotine  14 mg Transdermal Daily      Allergies:  Allergies  Allergen Reactions   Bee Venom Anaphylaxis   Penicillins Anaphylaxis    Did it involve swelling of the face/tongue/throat, SOB, or low BP? yes Did it involve sudden or severe rash/hives, skin peeling, or any reaction on the inside of your mouth or nose? unknown Did you need to seek medical attention at a hospital or doctor's office? yes When did it last happen?      1967 If all above answers are "NO", may proceed with cephalosporin use.    Procaine Anaphylaxis   Isosorbide Other (See Comments)    Caused migraines   Tape Itching and Other (See Comments)    Certain medical tapes make the patient's skin ITCH    Social History   Socioeconomic History   Marital status: Single    Spouse name: Not on file   Number of children: Not on file   Years of education: Not on file   Highest education level: Not on file  Occupational History   Occupation: Security guard    Employer: [employer]  Tobacco Use   Smoking status: Former    Types:  E-cigarettes   Smokeless tobacco: Current  Vaping Use   Vaping Use: Never used  Substance and Sexual Activity   Alcohol use: No   Drug use: No   Sexual activity: Not Currently  Other Topics Concern   Not on file  Social History Narrative   Not on file   Social Determinants of Health   Financial Resource Strain: Not on file  Food Insecurity: Food Insecurity Present (09/06/2022)   Hunger Vital Sign    Worried About Running Out of Food in the Last Year: Often true    Ran Out of Food in the Last Year: Often true  Transportation Needs:  No Transportation Needs (09/06/2022)   PRAPARE - Hydrologist (Medical): No    Lack of Transportation (Non-Medical): No  Physical Activity: Not on file  Stress: Not on file  Social Connections: Not on file  Intimate Partner Violence: Not At Risk (09/06/2022)   Humiliation, Afraid, Rape, and Kick questionnaire    Fear of Current or Ex-Partner: No    Emotionally Abused: No    Physically Abused: No    Sexually Abused: No     Family History  Problem Relation Age of Onset   Diabetes type II Other    Coronary artery disease Other    Bipolar disorder Other    Lung cancer Father    CAD Mother 30     ROS:  Please see the history of present illness.     All other systems reviewed and negative.    Physical Exam:  Blood pressure 125/74, pulse 75, temperature 98.1 F (36.7 C), temperature source Oral, resp. rate 15, height '5\' 8"'$  (1.727 m), weight 117.3 kg, SpO2 95 %. General: Well developed, well nourished male in no acute distress. Head: Normocephalic, atraumatic, sclera non-icteric, no xanthomas, nares are without discharge. EENT: normal Lymph Nodes:  none Back: without scoliosis/kyphosis, no CVA tendersness Neck: Negative for carotid bruits. JVD not elevated. Lungs: Clear bilaterally to auscultation without wheezes, rales, or rhonchi. Breathing is unlabored. Heart: RRR with S1 S2. No murmur , rubs, or gallops  appreciated. Abdomen: Soft, non-tender, non-distended with normoactive bowel sounds. No hepatomegaly. No rebound/guarding. No obvious abdominal masses. Msk:  Strength and tone appear normal for age. Extremities: No clubbing or cyanosis. No edema.  Distal pedal pulses are 2+ and equal bilaterally. Skin: Warm and Dry Neuro: Alert and oriented X 3. CN III-XII intact Grossly normal sensory and motor function . Psych:  Responds to questions appropriately with a normal affect.      Labs: Cardiac Enzymes No results for input(s): "CKTOTAL", "CKMB", "TROPONINI" in the last 72 hours. CBC Lab Results  Component Value Date   WBC 11.3 (H) 09/06/2022   HGB 14.1 09/06/2022   HCT 43.1 09/06/2022   MCV 87.1 09/06/2022   PLT 261 09/06/2022   PROTIME: No results for input(s): "LABPROT", "INR" in the last 72 hours. Chemistry  Recent Labs  Lab 09/05/22 0728 09/06/22 0030  NA 137 137  K 4.1 4.1  CL 107 105  CO2 23 23  BUN 15 21*  CREATININE 0.72 0.82  CALCIUM 8.7* 8.9  PROT 6.1*  --   BILITOT 0.6  --   ALKPHOS 98  --   ALT 22  --   AST 23  --   GLUCOSE 307* 226*   Lipids Lab Results  Component Value Date   CHOL 85 09/06/2022   HDL 23 (L) 09/06/2022   LDLCALC 28 09/06/2022   TRIG 169 (H) 09/06/2022   BNP No results found for: "PROBNP" Thyroid Function Tests: Recent Labs    09/06/22 0226  TSH 0.853           EKG: sinus  Tracings flutter and tele--all with flutter    Assessment and Plan:  Atrial flutter-typical-recurrent  Coronary artery disease prior PCI  Morbid obesity  Hypertension  Stroke-prior  Sleep apnea-untreated  Diabetes  Financial stress, in debt to [facility]  The patient has recurrent atrial flutter.  It is appropriate his Dr. Thermon Leyland suggested to undertake catheter ablation as the next step.  We will see if we can do it while he  is in-house.  We will know this within the next few hours.  We will transition him to a DOAC, specifically apixaban.  No  proteinuria, hence, no specific indication the context of his diabetes for ACE/ARB therapy.  Untreated sleep apnea.  This has been a financial issue If he is still  tomorrow, he should have social services consult as well as financial people   Virl Axe

## 2022-09-07 NOTE — Progress Notes (Addendum)
ANTICOAGULATION CONSULT NOTE - Initial Consult  Pharmacy Consult for Eliquis Indication: atrial fibrillation  Allergies  Allergen Reactions   Bee Venom Anaphylaxis   Penicillins Anaphylaxis    Did it involve swelling of the face/tongue/throat, SOB, or low BP? yes Did it involve sudden or severe rash/hives, skin peeling, or any reaction on the inside of your mouth or nose? unknown Did you need to seek medical attention at a hospital or doctor's office? yes When did it last happen?      1967 If all above answers are "NO", may proceed with cephalosporin use.    Procaine Anaphylaxis   Isosorbide Other (See Comments)    Caused migraines   Tape Itching and Other (See Comments)    Certain medical tapes make the patient's skin [facility]    Patient Measurements: Height: '5\' 8"'$  (172.7 cm) Weight: 117.3 kg (258 lb 9.6 oz) IBW/kg (Calculated) : 68.4  Vital Signs: Temp: 98.1 F (36.7 C) (09/24 0555) Temp Source: Oral (09/24 0555) BP: 125/74 (09/24 0555) Pulse Rate: 75 (09/24 0126)  Labs: Recent Labs    09/05/22 0728 09/05/22 0935 09/06/22 0030 09/06/22 0226 09/06/22 1010 09/06/22 2013  HGB 14.3  --  14.1  --   --   --   HCT 43.6  --  43.1  --   --   --   PLT 223  --  261  --   --   --   APTT  --   --   --   --  50*  --   HEPARINUNFRC  --   --   --   --  <0.10* 0.19*  CREATININE 0.72  --  0.82  --   --   --   TROPONINIHS 665* 2,141* 2,369* 1,566*  --   --     Estimated Creatinine Clearance: 120.7 mL/min (by C-G formula based on SCr of 0.82 mg/dL).   Medical History: Past Medical History:  Diagnosis Date   Atrial flutter ([place])    Bipolar 1 disorder ([place])    Coronary artery disease    Diabetes mellitus    Dyslipidemia    Emphysema    Hypertension    Mental disorder    BIPOLAR DISORDER   Migraine    NSTEMI (non-ST elevated myocardial infarction) ([place]) 09/20/2020   PONV (postoperative nausea and vomiting)    Renal disorder    Shortness of breath    Stroke ([place])     Transient ischemic attack (TIA)     Medications:  Medications Prior to Admission  Medication Sig Dispense Refill Last Dose   apixaban (ELIQUIS) 5 MG TABS tablet Take 1 tablet (5 mg total) by mouth 2 (two) times daily. 60 tablet 3 09/04/2022 at 1000   atorvastatin (LIPITOR) 80 MG tablet TAKE 1 TABLET BY MOUTH EVERY DAY (Patient taking differently: Take 80 mg by mouth daily.) 30 tablet 11 09/04/2022   carvedilol (COREG) 6.25 MG tablet Take 6.25 mg by mouth 2 (two) times daily with a meal.   09/04/2022 at 1000   clopidogrel (PLAVIX) 75 MG tablet TAKE 1 TABLET(75 MG) BY MOUTH DAILY WITH BREAKFAST (Patient taking differently: Take 75 mg by mouth daily.) 30 tablet 11 09/04/2022   glipiZIDE (GLUCOTROL) 5 MG tablet Take 1 tablet (5 mg total) by mouth daily before breakfast. 90 tablet 3 09/04/2022   metFORMIN (GLUCOPHAGE-XR) 500 MG 24 hr tablet Take 4 tablets (2,000 mg total) by mouth daily. 360 tablet 3 09/04/2022   Semaglutide,0.25 or 0.'5MG'$ /DOS, (OZEMPIC, 0.25 OR  0.5 MG/DOSE,) 2 MG/3ML SOPN Inject 0.5 mg into the skin once a week.   08/31/2022   carvedilol (COREG) 12.5 MG tablet Take 1 tablet (12.5 mg total) by mouth 2 (two) times daily with a meal. (Patient not taking: Reported on 09/06/2022) 60 tablet 3 Not Taking   Scheduled:   atorvastatin  80 mg Oral Daily   clopidogrel  75 mg Oral Daily   insulin aspart  0-10 Units Subcutaneous TID AC & HS   nicotine  14 mg Transdermal Daily   Assessment: 69 YOM presented with SOB and CP on 9/22, found to be in [place]. Pharmacy consulted t transition IV heparin to eliquis for patient's new onset afib. Patient was on initally started on Eliquis prior to admission. Will re-educate and discuss with patient.  Goal of Therapy:  Monitor for signs/symptoms of bleeding Monitor platelets by anticoagulation protocol: Yes  Plan:  Start eliquis '5mg'$  PO BID Monitor for signs/symptoms of bleeding  ADDENDUM:  Patient to undergo ablation Monday 9/25. Per Dr. Caryl Comes would  like to switch back to heparin for procedure after one dose of eliquis given. Patient was therapeutic at 2000 units/hr with heparin level at 0.45.  Plan: Initiate heparin infusion at 2000 Units/hr to start at 2100 Monitor aPTT and heparin levels until correlate with each other since received one dose of eliquis Monitor CBC daily and when to restart eliquis.  Sandford Craze, PharmD. [facility] Acute Care PGY-1  09/07/2022 9:30 AM

## 2022-09-08 ENCOUNTER — Inpatient Hospital Stay (HOSPITAL_COMMUNITY): Payer: 59 | Admitting: Certified Registered Nurse Anesthetist

## 2022-09-08 ENCOUNTER — Other Ambulatory Visit (HOSPITAL_COMMUNITY): Payer: Self-pay

## 2022-09-08 ENCOUNTER — Telehealth (HOSPITAL_COMMUNITY): Payer: Self-pay | Admitting: Pharmacy Technician

## 2022-09-08 ENCOUNTER — Encounter (HOSPITAL_COMMUNITY)
Admission: EM | Disposition: A | Discharge status: Home / Self Care | Payer: Self-pay | Source: Home / Self Care | Attending: Student in an Organized Health Care Education/Training Program

## 2022-09-08 ENCOUNTER — Encounter (HOSPITAL_COMMUNITY): Payer: Self-pay | Admitting: Internal Medicine

## 2022-09-08 DIAGNOSIS — I10 Essential (primary) hypertension: Secondary | ICD-10-CM

## 2022-09-08 DIAGNOSIS — Z87891 Personal history of nicotine dependence: Secondary | ICD-10-CM

## 2022-09-08 DIAGNOSIS — I2510 Atherosclerotic heart disease of native coronary artery without angina pectoris: Secondary | ICD-10-CM

## 2022-09-08 DIAGNOSIS — I4892 Unspecified atrial flutter: Secondary | ICD-10-CM | POA: Diagnosis not present

## 2022-09-08 DIAGNOSIS — I252 Old myocardial infarction: Secondary | ICD-10-CM

## 2022-09-08 HISTORY — PX: A-FLUTTER ABLATION: EP1230

## 2022-09-08 LAB — CBC
HCT: 41.2 % (ref 39.0–52.0)
Hemoglobin: 13.3 g/dL (ref 13.0–17.0)
MCH: 28 pg (ref 26.0–34.0)
MCHC: 32.3 g/dL (ref 30.0–36.0)
MCV: 86.7 fL (ref 80.0–100.0)
Platelets: 227 10*3/uL (ref 150–400)
RBC: 4.75 MIL/uL (ref 4.22–5.81)
RDW: 13.6 % (ref 11.5–15.5)
WBC: 7.9 10*3/uL (ref 4.0–10.5)
nRBC: 0 % (ref 0.0–0.2)

## 2022-09-08 LAB — APTT: aPTT: 78 seconds — ABNORMAL HIGH (ref 24–36)

## 2022-09-08 LAB — GLUCOSE, CAPILLARY
Glucose-Capillary: 249 mg/dL — ABNORMAL HIGH (ref 70–99)
Glucose-Capillary: 212 mg/dL — ABNORMAL HIGH (ref 70–99)
Glucose-Capillary: 210 mg/dL — ABNORMAL HIGH (ref 70–99)
Glucose-Capillary: 331 mg/dL — ABNORMAL HIGH (ref 70–99)
Glucose-Capillary: 319 mg/dL — ABNORMAL HIGH (ref 70–99)

## 2022-09-08 LAB — HEPARIN LEVEL (UNFRACTIONATED): Heparin Unfractionated: 0.7 IU/mL (ref 0.30–0.70)

## 2022-09-08 SURGERY — A-FLUTTER ABLATION
Anesthesia: General

## 2022-09-08 MED ORDER — APIXABAN 5 MG PO TABS
5.0000 mg | ORAL_TABLET | Freq: Two times a day (BID) | ORAL | Status: DC
  Administered 2022-09-08 – 2022-09-09 (×2): 5 mg via ORAL
  Filled 2022-09-08 (×2): qty 1

## 2022-09-08 MED ORDER — PROPOFOL 10 MG/ML IV BOLUS
INTRAVENOUS | Status: DC | PRN
  Administered 2022-09-08: 160 mg via INTRAVENOUS
  Administered 2022-09-08: 50 mg via INTRAVENOUS

## 2022-09-08 MED ORDER — ACETAMINOPHEN 325 MG PO TABS: 650.0000 mg | ORAL_TABLET | ORAL | Status: DC | PRN

## 2022-09-08 MED ORDER — SODIUM CHLORIDE 0.9 % IV SOLN: 250.0000 mL | INTRAVENOUS | Status: DC | PRN

## 2022-09-08 MED ORDER — ONDANSETRON HCL 4 MG/2ML IJ SOLN: 4.0000 mg | Freq: Four times a day (QID) | INTRAMUSCULAR | Status: DC | PRN

## 2022-09-08 MED ORDER — LIDOCAINE 2% (20 MG/ML) 5 ML SYRINGE
INTRAMUSCULAR | Status: DC | PRN
  Administered 2022-09-08: 100 mg via INTRAVENOUS

## 2022-09-08 MED ORDER — SODIUM CHLORIDE 0.9% FLUSH: 3.0000 mL | INTRAVENOUS | Status: DC | PRN

## 2022-09-08 MED ORDER — SUGAMMADEX SODIUM 200 MG/2ML IV SOLN
INTRAVENOUS | Status: DC | PRN
  Administered 2022-09-08: 200 mg via INTRAVENOUS

## 2022-09-08 MED ORDER — DEXMEDETOMIDINE HCL IN NACL 200 MCG/50ML IV SOLN
INTRAVENOUS | Status: DC | PRN
  Administered 2022-09-08: 12 ug via INTRAVENOUS
  Administered 2022-09-08: 20 ug via INTRAVENOUS

## 2022-09-08 MED ORDER — HEPARIN SODIUM (PORCINE) 1000 UNIT/ML IJ SOLN
INTRAMUSCULAR | Status: DC | PRN
  Administered 2022-09-08: 1000 [IU] via INTRAVENOUS

## 2022-09-08 MED ORDER — FENTANYL CITRATE (PF) 100 MCG/2ML IJ SOLN
INTRAMUSCULAR | Status: AC
  Filled 2022-09-08: qty 2

## 2022-09-08 MED ORDER — MIDAZOLAM HCL 2 MG/2ML IJ SOLN
INTRAMUSCULAR | Status: AC
  Filled 2022-09-08: qty 2

## 2022-09-08 MED ORDER — DEXAMETHASONE SODIUM PHOSPHATE 10 MG/ML IJ SOLN
INTRAMUSCULAR | Status: DC | PRN
  Administered 2022-09-08: 4 mg via INTRAVENOUS

## 2022-09-08 MED ORDER — SODIUM CHLORIDE 0.9% FLUSH
3.0000 mL | Freq: Two times a day (BID) | INTRAVENOUS | Status: DC
  Administered 2022-09-08 – 2022-09-09 (×3): 3 mL via INTRAVENOUS

## 2022-09-08 MED ORDER — ONDANSETRON HCL 4 MG/2ML IJ SOLN
INTRAMUSCULAR | Status: DC | PRN
  Administered 2022-09-08: 4 mg via INTRAVENOUS

## 2022-09-08 MED ORDER — PHENYLEPHRINE HCL-NACL 20-0.9 MG/250ML-% IV SOLN
INTRAVENOUS | Status: DC | PRN
  Administered 2022-09-08: 10 ug/min via INTRAVENOUS

## 2022-09-08 MED ORDER — HEPARIN (PORCINE) IN NACL 1000-0.9 UT/500ML-% IV SOLN
INTRAVENOUS | Status: DC | PRN
  Administered 2022-09-08: 500 mL

## 2022-09-08 MED ORDER — FENTANYL CITRATE (PF) 100 MCG/2ML IJ SOLN
INTRAMUSCULAR | Status: DC | PRN
  Administered 2022-09-08: 100 ug via INTRAVENOUS

## 2022-09-08 MED ORDER — ROCURONIUM BROMIDE 10 MG/ML (PF) SYRINGE
PREFILLED_SYRINGE | INTRAVENOUS | Status: DC | PRN
  Administered 2022-09-08: 10 mg via INTRAVENOUS
  Administered 2022-09-08: 60 mg via INTRAVENOUS

## 2022-09-08 MED ORDER — MIDAZOLAM HCL 2 MG/2ML IJ SOLN
INTRAMUSCULAR | Status: DC | PRN
  Administered 2022-09-08: 2 mg via INTRAVENOUS

## 2022-09-08 SURGICAL SUPPLY — 10 items
CATH SMTCH THERMOCOOL SF FJ (CATHETERS) IMPLANT
CATH WEB BI DIR CSDF CRV REPRO (CATHETERS) IMPLANT
DEVICE CLOSURE MYNXGRIP 6/7F (Vascular Products) IMPLANT
PACK EP LATEX FREE (CUSTOM PROCEDURE TRAY) ×1
PACK EP LF (CUSTOM PROCEDURE TRAY) ×1 IMPLANT
PAD DEFIB RADIO PHYSIO CONN (PAD) ×1 IMPLANT
PATCH CARTO3 (PAD) IMPLANT
SHEATH PINNACLE 8F 10CM (SHEATH) IMPLANT
SHEATH PROBE COVER 6X72 (BAG) IMPLANT
TUBING SMART ABLATE COOLFLOW (TUBING) IMPLANT

## 2022-09-08 NOTE — TOC Benefit Eligibility Note (Signed)
Patient Teacher, English as a foreign language completed.    The patient is currently admitted and upon discharge could be taking Eliquis 5 mg.  The current 30 day co-pay is $578.66   The patient is currently admitted and upon discharge could be taking Xarelto 20 mg.  The current 30 day co-pay is $559.84.   The patient is insured through [place], [facility] [place] Patient Advocate Team Direct Number: [phone]  Fax: [phone]

## 2022-09-08 NOTE — Discharge Instructions (Signed)
Post procedure care instructions No driving for 5 days. No lifting over 5 lbs for 1 week. No vigorous or sexual activity for 1 week. You may return to work/your usual activities on 09/16/22. Keep procedure site clean & dry. If you notice increased pain, swelling, bleeding or pus, call/return!  You may shower after 24 hours, but no soaking in baths/hot tubs/pools for 1 week.      Information on my medicine - ELIQUIS (apixaban)  Why was Eliquis prescribed for you? Eliquis was prescribed for you to reduce the risk of a blood clot forming that can cause a stroke if you have a medical condition called atrial fibrillation (a type of irregular heartbeat).  What do You need to know about Eliquis ? Take your Eliquis TWICE DAILY - one tablet in the morning and one tablet in the evening with or without food. If you have difficulty swallowing the tablet whole please discuss with your pharmacist how to take the medication safely.  Take Eliquis exactly as prescribed by your doctor and DO NOT stop taking Eliquis without talking to the doctor who prescribed the medication.  Stopping may increase your risk of developing a stroke.  Refill your prescription before you run out.  After discharge, you should have regular check-up appointments with your healthcare provider that is prescribing your Eliquis.  In the future your dose may need to be changed if your kidney function or weight changes by a significant amount or as you get older.  What do you do if you miss a dose? If you miss a dose, take it as soon as you remember on the same day and resume taking twice daily.  Do not take more than one dose of ELIQUIS at the same time to make up a missed dose.  Important Safety Information A possible side effect of Eliquis is bleeding. You should call your healthcare provider right away if you experience any of the following: Bleeding from an injury or your nose that does not stop. Unusual colored urine (red or  dark brown) or unusual colored stools (red or black). Unusual bruising for unknown reasons. A serious fall or if you hit your head (even if there is no bleeding).  Some medicines may interact with Eliquis and might increase your risk of bleeding or clotting while on Eliquis. To help avoid this, consult your healthcare provider or pharmacist prior to using any new prescription or non-prescription medications, including herbals, vitamins, non-steroidal anti-inflammatory drugs (NSAIDs) and supplements.  This website has more information on Eliquis (apixaban): http://www.eliquis.com/eliquis/home

## 2022-09-08 NOTE — Telephone Encounter (Signed)
Pharmacy Patient Advocate Encounter  Insurance verification completed.    The patient is insured through [employer]   The patient is currently admitted and ran test claims for the following: Eliquis, Xarelto.  Copays and coinsurance results were relayed to Inpatient clinical team.

## 2022-09-08 NOTE — Anesthesia Postprocedure Evaluation (Signed)
Anesthesia Post Note  Patient: Jerry Shaffer  Procedure(s) Performed: A-FLUTTER ABLATION     Patient location during evaluation: PACU Anesthesia Type: General Level of consciousness: awake and alert Pain management: pain level controlled Vital Signs Assessment: post-procedure vital signs reviewed and stable Respiratory status: spontaneous breathing, nonlabored ventilation, respiratory function stable and patient connected to nasal cannula oxygen Cardiovascular status: blood pressure returned to baseline and stable Postop Assessment: no apparent nausea or vomiting Anesthetic complications: no   No notable events documented.  Last Vitals:  Vitals:   09/08/22 1500 09/08/22 1549  BP:  136/89  Pulse: 92 97  Resp: 19 19  Temp: 37 C 37.2 C  SpO2: 98% 96%    Last Pain:  Vitals:   09/08/22 1549  TempSrc: Oral  PainSc:                  March Rummage Amarien Carne

## 2022-09-08 NOTE — Transfer of Care (Signed)
Immediate Anesthesia Transfer of Care Note  Patient: Jerry Shaffer  Procedure(s) Performed: A-FLUTTER ABLATION  Patient Location: PACU and Cath Lab  Anesthesia Type:General  Level of Consciousness: drowsy, patient cooperative and responds to stimulation  Airway & Oxygen Therapy: Patient Spontanous Breathing  Post-op Assessment: Report given to RN and Post -op Vital signs reviewed and stable  Post vital signs: Reviewed and stable  Last Vitals:  Vitals Value Taken Time  BP 112/58 09/08/22 1132  Temp    Pulse 71 09/08/22 1134  Resp 17 09/08/22 1134  SpO2 95 % 09/08/22 1134  Vitals shown include unvalidated device data.  Last Pain:  Vitals:   09/08/22 0553  TempSrc: Oral  PainSc:       Patients Stated Pain Goal: 0 (90/37/95 5831)  Complications: No notable events documented.

## 2022-09-08 NOTE — Inpatient Diabetes Management (Signed)
Inpatient Diabetes Program Recommendations  AACE/ADA: New Consensus Statement on Inpatient Glycemic Control (2015)  Target Ranges:  Prepandial:   less than 140 mg/dL      Peak postprandial:   less than 180 mg/dL (1-2 hours)      Critically ill patients:  140 - 180 mg/dL    Latest Reference Range & Units 09/07/22 08:31 09/07/22 11:44 09/07/22 17:26 09/07/22 22:13  Glucose-Capillary 70 - 99 mg/dL 364 (H)  10 units Novolog  225 (H)  4 units Novolog  197 (H)  2 units Novolog  235 (H)  4 units Novolog   (H): Data is abnormally high  Latest Reference Range & Units 09/08/22 08:39  Glucose-Capillary 70 - 99 mg/dL 249 (H)  (H): Data is abnormally high    Admit with: Atrial flutter with RVR/ NSTEMI  History: DM  Home DM Meds: Glipizide 5 mg daily        Metformin 2000 mg daily        Ozempic 0.5 mg Qweek  Current Orders: Novolog 0-10 units TID AC + HS (coverage starts at CBG of 151 mg/dl)    MD- Note CBGs elevated.  Home diabetes meds are on hold.  Please consider starting basal insulin for pt in hospital to help with CBG control:  Recommend Semglee 12 units Daily (0.1 units/kg based on weight of 115.6 kg)    --Will follow patient during hospitalization--  Wyn Quaker RN, MSN, [place] Diabetes Coordinator Inpatient Glycemic Control Team Team Pager: [phone] (8a-5p)

## 2022-09-08 NOTE — Anesthesia Procedure Notes (Signed)
Procedure Name: Intubation Date/Time: 09/08/2022 10:23 AM  Performed by: Janace Litten, CRNAPre-anesthesia Checklist: Patient identified, Emergency Drugs available, Suction available and Patient being monitored Patient Re-evaluated:Patient Re-evaluated prior to induction Oxygen Delivery Method: Circle System Utilized Preoxygenation: Pre-oxygenation with 100% oxygen Induction Type: IV induction Ventilation: Mask ventilation without difficulty and Oral airway inserted - appropriate to patient size Laryngoscope Size: Mac and 4 Grade View: Grade I Tube type: Oral Tube size: 7.0 mm Number of attempts: 1 Airway Equipment and Method: Stylet Placement Confirmation: ETT inserted through vocal cords under direct vision, positive ETCO2 and breath sounds checked- equal and bilateral Secured at: 22 cm Tube secured with: Tape Dental Injury: Teeth and Oropharynx as per pre-operative assessment

## 2022-09-08 NOTE — Progress Notes (Signed)
Rounding Note    Patient Name: Jerry Shaffer Date of Encounter: 09/08/2022  [facility] Cardiologist: Sanda Klein, MD  Subjective   No new c/o  Inpatient Medications    Scheduled Meds:  atorvastatin  80 mg Oral Daily   clopidogrel  75 mg Oral Daily   insulin aspart  0-10 Units Subcutaneous TID AC & HS   nicotine  14 mg Transdermal Daily   Continuous Infusions:  sodium chloride     heparin 2,000 Units/hr (09/07/22 2119)   PRN Meds: acetaminophen   Vital Signs    Vitals:   09/07/22 0844 09/07/22 1653 09/07/22 2215 09/08/22 0553  BP: (!) 149/96 (!) 158/86 137/83 (!) 137/92  Pulse: 86 74 80 79  Resp: _0 Temp: 98.6 F (37 C) 98.3 F (36.8 C) 99 F (37.2 C) 98.1 F (36.7 C)  TempSrc: Oral Oral Oral Oral  SpO2:  95%    Weight:    115.6 kg  Height:        Intake/Output Summary (Last 24 hours) at 09/08/2022 0904 Last data filed at 09/07/2022 2121 Gross per 24 hour  Intake 923.81 ml  Output --  Net 923.81 ml      09/08/2022    5:53 AM 09/06/2022    5:08 AM 09/05/2022    7:36 AM  Last 3 Weights  Weight (lbs) 254 lb 12.8 oz 258 lb 9.6 oz 258 lb  Weight (kg) 115.577 kg 117.3 kg 117.028 kg      Telemetry    SR  80's - Personally Reviewed  ECG    SR 78bpm - Personally Reviewed  Physical Exam  Examined by Dr. Myles Gip GEN: No acute distress.   Neck: No JVD Cardiac: RRR, no murmurs, rubs, or gallops.  Respiratory: CTA b/l. GI: Soft, nontender, non-distended  MS: No edema; No deformity. Neuro:  Nonfocal  Psych: Normal affect   Labs    High Sensitivity Troponin:   Recent Labs  Lab 09/05/22 0728 09/05/22 0935 09/06/22 0030 09/06/22 0226  TROPONINIHS 665* 2,141* 2,369* 1,566*     Chemistry Recent Labs  Lab 09/05/22 0728 09/06/22 0030 09/06/22 0226  NA 137 137  --   K 4.1 4.1  --   CL 107 105  --   CO2 23 23  --   GLUCOSE 307* 226*  --   BUN 15 21*  --   CREATININE 0.72 0.82  --   CALCIUM 8.7* 8.9  --   MG 1.5*   --  1.8  PROT 6.1*  --   --   ALBUMIN 3.8  --   --   AST 23  --   --   ALT 22  --   --   ALKPHOS 98  --   --   BILITOT 0.6  --   --   GFRNONAA >60 >60  --   ANIONGAP 7 9  --     Lipids  Recent Labs  Lab 09/06/22 0226  CHOL 85  TRIG 169*  HDL 23*  LDLCALC 28  CHOLHDL 3.7    Hematology Recent Labs  Lab 09/06/22 0030 09/07/22 0730 09/08/22 0257  WBC 11.3* 10.7* 7.9  RBC 4.95 5.38 4.75  HGB 14.1 14.9 13.3  HCT 43.1 47.0 41.2  MCV 87.1 87.4 86.7  MCH 28.5 27.7 28.0  MCHC 32.7 31.7 32.3  RDW 13.5 13.7 13.6  PLT 261 276 227   Thyroid  Recent Labs  Lab 09/06/22 0226  TSH  0.853  FREET4 0.83    BNP Recent Labs  Lab 09/05/22 0728  BNP 101.3*    DDimer No results for input(s): "DDIMER" in the last 168 hours.   Radiology     Cardiac Studies    09/06/22: TTE  1. Left ventricular ejection fraction, by estimation, is 60 to 65%. The  left ventricle has normal function. The left ventricle has no regional  wall motion abnormalities. There is mild left ventricular hypertrophy.  Left ventricular diastolic parameters  are indeterminate.   2. Right ventricular systolic function is normal. The right ventricular  size is normal.   3. Left atrial size was mildly dilated.   4. The mitral valve is normal in structure. No evidence of mitral valve  regurgitation. No evidence of mitral stenosis.   5. The aortic valve was not well visualized. Aortic valve regurgitation  is not visualized. No aortic stenosis is present.   6. The inferior vena cava is normal in size with greater than 50%  respiratory variability, suggesting right atrial pressure of 3 mmHg.   ECHO 09/20/2020  1. Left ventricular ejection fraction, by estimation, is 55 to 60%. The  left ventricle has normal function. The left ventricle has no regional  wall motion abnormalities. There is severe left ventricular hypertrophy.  Left ventricular diastolic parameters   are consistent with Grade I diastolic  dysfunction (impaired relaxation).   2. Right ventricular systolic function is normal. The right ventricular  size is normal. Tricuspid regurgitation signal is inadequate for assessing  PA pressure.   3. The mitral valve is normal in structure. No evidence of mitral valve  regurgitation. No evidence of mitral stenosis.   4. The aortic valve is tricuspid. Aortic valve regurgitation is not  visualized. No aortic stenosis is present.   5. Aortic dilatation noted. There is mild dilatation of the aortic root,  measuring 38 mm.   6. The inferior vena cava is normal in size with greater than 50%  respiratory variability, suggesting right atrial pressure of 3 mmHg.    Cath 09/20/2020 Total occlusion of the mid PDA (a large vessel that extends to the left ventricular apex), filling sluggishly by apical LAD to PDA collaterals.  This vessel is felt to represent the culprit for the patient's presentation. Total occlusion of the mid PDA reduced to 0% using an 18 x 2.75 mm Onyx deployed at 12 atm x 2 inflations.  TIMI grade III flow was noted.  Resolution of chest pain occurred.  Proximal to the stented segment there is a 30 to 40% stenosis in the PDA. Left main is widely patent LAD contains mid vessel disease with a Medina 011 bifurcation stenosis.  LAD is 70% and diagonal is 40%.  This can be managed medically. Circumflex contains segmental 40 to 50% stenosis in the large second obtuse marginal. RCA is dominant, tortuous, and no significant disease other than that noted above in the PDA. Mid inferior wall to apical akinesis.  LVEDP 6 mmHg.  EF 45 to 50%.   Cath 03/28/21 1.  Patent left main 2.  Severe stenosis of the proximal LAD, new from cardiac catheterization 6 months ago, treated successfully with PCI using a 3.5 x 20 mm Synergy DES.  The mid LAD is unchanged from the prior study with Medina 0, 1, 1 bifurcation disease with only moderate stenosis in the LAD and mild stenosis in the diagonal 3.   Moderate 50% mid circumflex stenosis 4.  Continued patency of the right PDA stent  with mild diffuse nonobstructive PDA and RCA stenosis 5.  Normal LVEDP  Patient Profile     61 y.o. male CAD S/P PCI in 10/21 (mid PDA 100% stenosed at the time) and in 4/22 (prox LAD), DM, HLD, stroke/TIA, bipolar d/o, OSA (untreated) admitted with c/o CP EMS found him in AFlutter  did get cardioverted with resolution in his CP and started on [facility]  Assessment & Plan    New AFlutter (typical) (though in hind-sight pt has had similar symptoms on the past) CHA2DS2Vasc is 5, started on Eliquis here >> heparin gtt for procedure (per dr. Caryl Comes) Remains in SR this AM Planned for EPS/ablation today with Dr. Myles Gip  CAD Demand ischemia Out patient ischemic eval per gen cards On plavix (ASA stopped w/eliquis start)  DM SSI here  For questions or updates, please contact [place] Please consult [email or web address] for contact info under        Signed, Baldwin [place], PA-C  09/08/2022, 9:04 AM

## 2022-09-08 NOTE — Care Management (Signed)
  Transition of Care [facility]) Screening Note   Patient Details  Name: Jerry Shaffer Date of Birth: 09-20-1961   [facility] CM/SW Contact:    Bethena Roys, RN Phone Number: 09/08/2022, 12:28 PM    Transition of Care Department [facility]) has reviewed the patient and no TOC needs have been identified at this time. Case Manager attempted to speak with the patient post procedure, he feels that he will be able to ambulate without any PT/OT. Case Manager will continue to follow patient advancement through interdisciplinary progression rounds. If new patient transition needs arise, please place a TOC consult.

## 2022-09-08 NOTE — Progress Notes (Signed)
[place] for Eliquis >> Heparin Indication: atrial fibrillation  Allergies  Allergen Reactions   Bee Venom Anaphylaxis   Penicillins Anaphylaxis    Did it involve swelling of the face/tongue/throat, SOB, or low BP? yes Did it involve sudden or severe rash/hives, skin peeling, or any reaction on the inside of your mouth or nose? unknown Did you need to seek medical attention at a hospital or doctor's office? yes When did it last happen?      1967 If all above answers are "NO", may proceed with cephalosporin use.    Procaine Anaphylaxis   Isosorbide Other (See Comments)    Caused migraines   Tape Itching and Other (See Comments)    Certain medical tapes make the patient's skin [facility]    Patient Measurements: Height: '5\' 8"'$  (172.7 cm) Weight: 117.3 kg (258 lb 9.6 oz) IBW/kg (Calculated) : 68.4  Vital Signs: Temp: 99 F (37.2 C) (09/24 2215) Temp Source: Oral (09/24 2215) BP: 137/83 (09/24 2215) Pulse Rate: 80 (09/24 2215)  Labs: Recent Labs    09/05/22 0728 09/05/22 0935 09/06/22 0030 09/06/22 0226 09/06/22 1010 09/06/22 1010 09/06/22 2013 09/07/22 0730 09/08/22 0257  HGB 14.3  --  14.1  --   --   --   --  14.9 13.3  HCT 43.6  --  43.1  --   --   --   --  47.0 41.2  PLT 223  --  261  --   --   --   --  276 227  APTT  --   --   --   --  50*  --   --   --  78*  HEPARINUNFRC  --   --   --   --  <0.10*   < > 0.19* 0.45 0.70  CREATININE 0.72  --  0.82  --   --   --   --   --   --   TROPONINIHS 665* 2,141* 2,369* 1,566*  --   --   --   --   --    < > = values in this interval not displayed.     Estimated Creatinine Clearance: 120.7 mL/min (by C-G formula based on SCr of 0.82 mg/dL).  Assessment: 69 YOM presented with SOB and CP on 9/22, found to be in [place].  Patient has been on and off IV heparin and Eliquis.  Currently, Pharmacy has been consulted to dose IV heparin.   aPTT therapeutic at 78 sec.  Heparin level is  therapeutic and not yet correlating with aPTT.  No bleeding reported.  Goal of Therapy:  aPTT 66-102 sec Heparin level 0.3-0.7 units/mL Monitor platelets by anticoagulation protocol: Yes  Plan:  Continue heparin infusion at 2000 units/hr Daily heparin level, aPTT and CBC  Indiah Heyden D. Mina Marble, PharmD, BCPS, [place] 09/08/2022, 4:09 AM

## 2022-09-08 NOTE — Anesthesia Preprocedure Evaluation (Addendum)
Anesthesia Evaluation  Patient identified by MRN, date of birth, ID band Patient awake    Reviewed: Allergy & Precautions, NPO status , Patient's Chart, lab work & pertinent test results  History of Anesthesia Complications (+) PONV and history of anesthetic complications  Airway Mallampati: III  TM Distance: >3 FB Neck ROM: Full    Dental  (+) Missing,    Pulmonary COPD,  COPD inhaler, former smoker,    Pulmonary exam normal        Cardiovascular hypertension, Pt. on home beta blockers + CAD, + Past MI and + Cardiac Stents  Normal cardiovascular exam+ dysrhythmias Atrial Fibrillation   ECHO: Left ventricular ejection fraction, by estimation, is 60 to 65%. The left ventricle has normal function. The left ventricle has no regional wall motion abnormalities. There is mild left ventricular hypertrophy. Left ventricular diastolic parameters are indeterminate. Right ventricular systolic function is normal. The right ventricular size is normal. Left atrial size was mildly dilated. The mitral valve is normal in structure. No evidence of mitral valve regurgitation. No evidence of mitral stenosis. The aortic valve was not well visualized. Aortic valve regurgitation is not visualized. No aortic stenosis is present. The inferior vena cava is normal in size with greater than 50% respiratory variability, suggesting right atrial pressure of 3 mmHg.   Neuro/Psych  Headaches, PSYCHIATRIC DISORDERS Bipolar Disorder CVA, No Residual Symptoms    GI/Hepatic negative GI ROS, Neg liver ROS,   Endo/Other  diabetes, Type 2, Oral Hypoglycemic Agents  Renal/GU negative Renal ROS     Musculoskeletal negative musculoskeletal ROS (+)   Abdominal   Peds  Hematology  (+) Blood dyscrasia, ,   Anesthesia Other Findings A-flutter  Reproductive/Obstetrics                            Anesthesia Physical Anesthesia  Plan  ASA: 3  Anesthesia Plan: General   Post-op Pain Management: Minimal or no pain anticipated   Induction: Intravenous  PONV Risk Score and Plan: 2 and Ondansetron, Dexamethasone, Midazolam and Treatment may vary due to age or medical condition  Airway Management Planned: Oral ETT  Additional Equipment: None  Intra-op Plan:   Post-operative Plan: Extubation in OR  Informed Consent: I have reviewed the patients History and Physical, chart, labs and discussed the procedure including the risks, benefits and alternatives for the proposed anesthesia with the patient or authorized representative who has indicated his/her understanding and acceptance.     Dental advisory given  Plan Discussed with: CRNA  Anesthesia Plan Comments:        Anesthesia Quick Evaluation

## 2022-09-09 ENCOUNTER — Other Ambulatory Visit: Payer: Self-pay | Admitting: *Deleted

## 2022-09-09 ENCOUNTER — Other Ambulatory Visit (HOSPITAL_COMMUNITY): Payer: Self-pay

## 2022-09-09 ENCOUNTER — Encounter: Payer: Self-pay | Admitting: Physician Assistant

## 2022-09-09 DIAGNOSIS — I209 Angina pectoris, unspecified: Secondary | ICD-10-CM

## 2022-09-09 LAB — BASIC METABOLIC PANEL
Anion gap: 5 (ref 5–15)
BUN: 15 mg/dL (ref 6–20)
CO2: 24 mmol/L (ref 22–32)
Calcium: 8.7 mg/dL — ABNORMAL LOW (ref 8.9–10.3)
Chloride: 104 mmol/L (ref 98–111)
Creatinine, Ser: 0.76 mg/dL (ref 0.61–1.24)
GFR, Estimated: >60 mL/min (ref >60)
Glucose, Bld: 242 mg/dL — ABNORMAL HIGH (ref 70–99)
Potassium: 3.9 mmol/L (ref 3.5–5.1)
Sodium: 133 mmol/L — ABNORMAL LOW (ref 135–145)

## 2022-09-09 LAB — MAGNESIUM: Magnesium: 1.8 mg/dL (ref 1.7–2.4)

## 2022-09-09 LAB — GLUCOSE, CAPILLARY: Glucose-Capillary: 329 mg/dL — ABNORMAL HIGH (ref 70–99)

## 2022-09-09 MED ORDER — APIXABAN 5 MG PO TABS
5.0000 mg | ORAL_TABLET | Freq: Two times a day (BID) | ORAL | 5 refills | Status: DC
  Filled 2022-09-09: qty 60, 30d supply, fill #0

## 2022-09-09 MED ORDER — CARVEDILOL 6.25 MG PO TABS: 6.2500 mg | ORAL_TABLET | Freq: Two times a day (BID) | ORAL | Status: DC

## 2022-09-09 MED ORDER — CARVEDILOL 12.5 MG PO TABS: 12.5000 mg | ORAL_TABLET | Freq: Two times a day (BID) | ORAL | Status: DC

## 2022-09-09 NOTE — Social Work (Signed)
CSW received consult for food resources. CSW spoke with patient at bedside. CSW offered patient food resources. Patient accepted. All questions answered. No further questions reported at this time.

## 2022-09-09 NOTE — Inpatient Diabetes Management (Signed)
Inpatient Diabetes Program Recommendations  AACE/ADA: New Consensus Statement on Inpatient Glycemic Control (2015)  Target Ranges:  Prepandial:   less than 140 mg/dL      Peak postprandial:   less than 180 mg/dL (1-2 hours)      Critically ill patients:  140 - 180 mg/dL    Latest Reference Range & Units 09/08/22 08:39 09/08/22 11:48 09/08/22 13:04 09/08/22 15:46 09/08/22 20:52  Glucose-Capillary 70 - 99 mg/dL 249 (H)  NovologNOT given 212 (H)  4 mg Decadron '@1046'$  210 (H)  4 units Novolog  331 (H)  8 units Novolog  319 (H)  8 units Novolog   (H): Data is abnormally high  Latest Reference Range & Units 09/09/22 07:46  Glucose-Capillary 70 - 99 mg/dL 329 (H)  8 units Novolog   (H): Data is abnormally high    History: DM   Home DM Meds: Glipizide 5 mg daily                              Metformin 2000 mg daily                              Ozempic 0.5 mg Qweek   Current Orders: Novolog 0-10 units TID AC + HS (coverage starts at CBG of 151 mg/dl)       MD- Note CBGs elevated.  Home diabetes meds are on hold.  Also of note, pt received Decadron X 1 dose yest AM.   Please consider starting basal insulin for pt in hospital to help with CBG control:   Recommend Semglee 12 units Daily (0.1 units/kg based on weight of 115.6 kg)   --Will follow patient during hospitalization--  Wyn Quaker RN, MSN, [place] Diabetes Coordinator Inpatient Glycemic Control Team Team Pager: [phone] (8a-5p)

## 2022-09-09 NOTE — Discharge Summary (Addendum)
DISCHARGE SUMMARY    Patient ID: Jerry Shaffer,  MRN: [id number], DOB/AGE: 1961/10/08 61 y.o.  Admit date: 09/06/2022 Discharge date: 09/09/2022  Primary Care Physician: Patient, No Pcp Per Primary Cardiologist: Dr. Sallyanne Kuster Electrophysiologist: new, Dr. Myles Gip  Primary Discharge Diagnosis:  Typical Atrial flutter status post ablation this admission CHA2DS2Vasc is 3, started on Eliquis, appropriately dosed  Secondary Discharge Diagnosis:  CAD  s/p NSTEMI w/ PCI to PDA (09/20/20) s/p PCI to pLAD (03/28/21) HTN HLD DM   Allergies  Allergen Reactions   Bee Venom Anaphylaxis   Penicillins Anaphylaxis    Did it involve swelling of the face/tongue/throat, SOB, or low BP? yes Did it involve sudden or severe rash/hives, skin peeling, or any reaction on the inside of your mouth or nose? unknown Did you need to seek medical attention at a hospital or doctor's office? yes When did it last happen?      1967 If all above answers are "NO", may proceed with cephalosporin use.    Procaine Anaphylaxis   Isosorbide Other (See Comments)    Caused migraines   Tape Itching and Other (See Comments)    Certain medical tapes make the patient's skin [facility]     Procedures This Admission: 1.  Electrophysiology study and radiofrequency catheter ablation on 09/08/22 by Dr Myles Gip.   CONCLUSIONS:   1. Successful radiofrequency ablation of atrial flutter along the cavotricuspid isthmus with complete bidirectional isthmus block achieved.   3. No inducible arrhythmias following ablation.   4. No early apparent complications.    Brief HPI: Jerry Shaffer is a 61 y.o. male with a past medical history as outlined above, day of his admission at approximately 3 AM when he developed sudden onset palpitations, chest pain, diaphoresis, and SOB.  The symptoms prompted him to call EMS who noted he was tachycardic to the 150s.  He was given adenosine and Cardizem IV pushes, but remained tachycardic upon  arrival to the ED.  EKG demonstrated new onset atrial flutter.  Cardiology was consulted and a DCCV was performed in the ED which converted the patient to a rate controlled atrial fibrillation.  He was started on a diltiazem gtt and later converted to [facility] Course:  The patient was admitted magnesium was low and replaced, abnormal HS Trop felt to be demand ischemia in setting of RVR. CT chest negative for PE TTE with preserved EF EP was consulted to consider in patient ablation given severity of his symptoms, cardiology recommendation was to follow up with them outpt for ischemic evaluation. He underwent EPS/ablation 09/08/22 with Dr. Myles Gip. Labs have been stable, VSS, and groin site as well, this morning stable. Site care and activity restrictions were discussed with the patient.  No work x1 week, a letter was provided. EP and cardiology follow up are in place  Eliquis for a/c   Physical Exam: Vitals:   09/08/22 1549 09/08/22 2009 09/09/22 0644 09/09/22 0755  BP: 136/89 (!) 132/90 (!) 156/97 (!) 140/80  Pulse: 97 (!) 107 91 97  Resp: '19 20 20 20  '$ Temp: 98.9 F (37.2 C) 98.4 F (36.9 C) 98.7 F (37.1 C) 98.6 F (37 C)  TempSrc: Oral Oral Oral Oral  SpO2: 96%  96% 96%  Weight:   114.4 kg   Height:        GEN- The patient is well appearing, alert and oriented x 3 today.   HEENT: normocephalic, atraumatic; sclera clear, conjunctiva pink; hearing intact; oropharynx clear;  neck supple, no JVP Lymph- no cervical lymphadenopathy Lungs- CTA b/l, normal work of breathing.  No wheezes, rales, rhonchi Heart- RRR, no murmurs, rubs or gallops, PMI not laterally displaced GI- soft, non-tender, non-distended Extremities- no clubbing, cyanosis, or edema; R groin is soft, nontender and  without hematoma/bruit MS- no significant deformity or atrophy Skin- warm and dry, no rash or lesion Psych- euthymic mood, full affect Neuro- strength and sensation are intact   Labs:   Lab  Results  Component Value Date   WBC 7.9 09/08/2022   HGB 13.3 09/08/2022   HCT 41.2 09/08/2022   MCV 86.7 09/08/2022   PLT 227 09/08/2022    Recent Labs  Lab 09/05/22 0728 09/06/22 0030 09/09/22 0630  NA 137   < > 133*  K 4.1   < > 3.9  CL 107   < > 104  CO2 23   < > 24  BUN 15   < > 15  CREATININE 0.72   < > 0.76  CALCIUM 8.7*   < > 8.7*  PROT 6.1*  --   --   BILITOT 0.6  --   --   ALKPHOS 98  --   --   ALT 22  --   --   AST 23  --   --   GLUCOSE 307*   < > 242*   < > = values in this interval not displayed.    Discharge Medications:  Allergies as of 09/09/2022       Reactions   Bee Venom Anaphylaxis   Penicillins Anaphylaxis   Did it involve swelling of the face/tongue/throat, SOB, or low BP? yes Did it involve sudden or severe rash/hives, skin peeling, or any reaction on the inside of your mouth or nose? unknown Did you need to seek medical attention at a hospital or doctor's office? yes When did it last happen?      1967 If all above answers are "NO", may proceed with cephalosporin use.   Procaine Anaphylaxis   Isosorbide Other (See Comments)   Caused migraines   Tape Itching, Other (See Comments)   Certain medical tapes make the patient's skin ITCH        Medication List     TAKE these medications    apixaban 5 MG Tabs tablet Commonly known as: ELIQUIS Take 1 tablet (5 mg total) by mouth 2 (two) times daily.   atorvastatin 80 MG tablet Commonly known as: LIPITOR TAKE 1 TABLET BY MOUTH EVERY DAY   carvedilol 6.25 MG tablet Commonly known as: COREG Take 6.25 mg by mouth 2 (two) times daily with a meal. What changed: Another medication with the same name was removed. Continue taking this medication, and follow the directions you see here.   clopidogrel 75 MG tablet Commonly known as: PLAVIX TAKE 1 TABLET(75 MG) BY MOUTH DAILY WITH BREAKFAST What changed: See the new instructions.   glipiZIDE 5 MG tablet Commonly known as: GLUCOTROL Take 1  tablet (5 mg total) by mouth daily before breakfast.   metFORMIN 500 MG 24 hr tablet Commonly known as: GLUCOPHAGE-XR Take 4 tablets (2,000 mg total) by mouth daily.   Ozempic (0.25 or 0.5 MG/DOSE) 2 MG/3ML Sopn Generic drug: Semaglutide(0.25 or 0.'5MG'$ /DOS) Inject 0.5 mg into the skin once a week.        Disposition: Home Discharge Instructions     Diet - low sodium heart healthy   Complete by: As directed  Duration of Discharge Encounter: Greater than 30 minutes including physician time.  SignedTommye Standard, PA-C 09/09/2022 10:44 AM   Coding enquiry: with TNI > 2000, the patient presented with myocardial infarction due to supply/demand mismatch -- type II MI, without ACS.

## 2022-09-09 NOTE — Care Management (Signed)
1113 09-09-22 Medications were filled via [place] 30 day free card utilized. Staff RN provided the patient with the [employer] pay card. No further needs identified at this time.

## 2022-09-09 NOTE — Progress Notes (Signed)
Attestation for the [employer]

## 2022-09-12 ENCOUNTER — Telehealth: Payer: Self-pay | Admitting: Cardiovascular Disease

## 2022-09-12 NOTE — Telephone Encounter (Signed)
Pt calling back stating he called and the afib clinic is closed and wont be open till next week. He is asking for advise what he should do, if his hr issue is normal following ablation or should he follow up in ER.

## 2022-09-12 NOTE — Telephone Encounter (Signed)
Per recommendation of Myrtie Hawk, RN, advised patient to contact A-Fib clinic to better triage his symptoms and see if he needs to be seen in their clinic today or Monday.  Provided A-Fib clinic number [phone].  Patient verbalized understanding and expressed appreciation for call.

## 2022-09-12 NOTE — Telephone Encounter (Signed)
Returned call to patient. He called a-fib clinc, but they were closed. He reiterates that he only wants to know if what he is feeling now is normal, or if he should be heading back to hospital before it gets 'real bad." Questioned patient further on his symptoms and he states that he's not having any chest pain (he was before ablation), no shortness of breath, and states that the lightheadedness that he has experienced has been overall minimal. He describes it as not constant, but easily provoked with minimal activity. Also says he knows he overexerted himself twice today, which he realizes didn't help. Educated him that ablation is a treatment, not a cure, and that what he's feeling isn't completely abnormal. He is in the blanking period and can have episodes of breakthrough A-fib, but to base things on how he feels. His symptoms at this time are minimal and states his HR hasn't gotten any higher than 127bpm and that was only one occurrence. At time of call he is 101bpm and asymptomatic. Advised that he relax as much as possible, but should he feel anything like how he felt prior to procedure, to return to hospital. He plans to call A-fib clinic Monday morning when they will be back open. He understands he may need an order for Diltiazem to use as needed for breakthrough episodes. Will call us back if he needs [place] before Monday am. Advised that if he sees his HR continuously rising and maintaining-not coming back down-to proceed to hospital.

## 2022-09-12 NOTE — Telephone Encounter (Signed)
  STAT if HR is under 50 or over 120 (normal HR is 60-100 beats per minute)  What is your heart rate? Mid 90s to 127 bpm  Do you have a log of your heart rate readings (document readings)?   Do you have any other symptoms?  A little lightheaded   Pt said, his HR goes up between mid 90s to 127 bpm. He said, when his HR is around 124-127 he gets a little lightheaded, and his HR will go up even just walking a little bit. He wanted to know if this is normal after ablation

## 2022-09-14 ENCOUNTER — Emergency Department (HOSPITAL_COMMUNITY): Payer: PRIVATE HEALTH INSURANCE

## 2022-09-14 ENCOUNTER — Encounter (HOSPITAL_COMMUNITY): Payer: Self-pay | Admitting: Emergency Medicine

## 2022-09-14 ENCOUNTER — Emergency Department (HOSPITAL_COMMUNITY)
Admission: EM | Admit: 2022-09-14 | Discharge: 2022-09-15 | Disposition: A | Discharge status: Home / Self Care | Payer: PRIVATE HEALTH INSURANCE | Attending: Emergency Medicine | Admitting: Emergency Medicine

## 2022-09-14 ENCOUNTER — Other Ambulatory Visit: Payer: Self-pay

## 2022-09-14 DIAGNOSIS — Z79899 Other long term (current) drug therapy: Secondary | ICD-10-CM | POA: Diagnosis not present

## 2022-09-14 DIAGNOSIS — I10 Essential (primary) hypertension: Secondary | ICD-10-CM | POA: Diagnosis not present

## 2022-09-14 DIAGNOSIS — R079 Chest pain, unspecified: Secondary | ICD-10-CM

## 2022-09-14 DIAGNOSIS — H60592 Other noninfective acute otitis externa, left ear: Secondary | ICD-10-CM | POA: Insufficient documentation

## 2022-09-14 DIAGNOSIS — E119 Type 2 diabetes mellitus without complications: Secondary | ICD-10-CM | POA: Insufficient documentation

## 2022-09-14 DIAGNOSIS — Z8673 Personal history of transient ischemic attack (TIA), and cerebral infarction without residual deficits: Secondary | ICD-10-CM | POA: Diagnosis not present

## 2022-09-14 DIAGNOSIS — Z7901 Long term (current) use of anticoagulants: Secondary | ICD-10-CM | POA: Diagnosis not present

## 2022-09-14 DIAGNOSIS — H60502 Unspecified acute noninfective otitis externa, left ear: Secondary | ICD-10-CM

## 2022-09-14 DIAGNOSIS — Z7984 Long term (current) use of oral hypoglycemic drugs: Secondary | ICD-10-CM | POA: Diagnosis not present

## 2022-09-14 DIAGNOSIS — U071 COVID-19: Secondary | ICD-10-CM | POA: Insufficient documentation

## 2022-09-14 LAB — CBC
HCT: 44.1 % (ref 39.0–52.0)
Hemoglobin: 14.6 g/dL (ref 13.0–17.0)
MCH: 28.3 pg (ref 26.0–34.0)
MCHC: 33.1 g/dL (ref 30.0–36.0)
MCV: 85.6 fL (ref 80.0–100.0)
Platelets: 237 10*3/uL (ref 150–400)
RBC: 5.15 MIL/uL (ref 4.22–5.81)
RDW: 13.8 % (ref 11.5–15.5)
WBC: 9.9 10*3/uL (ref 4.0–10.5)
nRBC: 0 % (ref 0.0–0.2)

## 2022-09-14 LAB — BASIC METABOLIC PANEL
Anion gap: 12 (ref 5–15)
BUN: 26 mg/dL — ABNORMAL HIGH (ref 6–20)
CO2: 22 mmol/L (ref 22–32)
Calcium: 9.3 mg/dL (ref 8.9–10.3)
Chloride: 100 mmol/L (ref 98–111)
Creatinine, Ser: 0.88 mg/dL (ref 0.61–1.24)
GFR, Estimated: >60 mL/min (ref >60)
Glucose, Bld: 280 mg/dL — ABNORMAL HIGH (ref 70–99)
Potassium: 4.4 mmol/L (ref 3.5–5.1)
Sodium: 134 mmol/L — ABNORMAL LOW (ref 135–145)

## 2022-09-14 LAB — SARS CORONAVIRUS 2 BY RT PCR: SARS Coronavirus 2 by RT PCR: POSITIVE — AB

## 2022-09-14 LAB — TROPONIN I (HIGH SENSITIVITY)
Troponin I (High Sensitivity): 28 ng/L — ABNORMAL HIGH (ref <18)
Troponin I (High Sensitivity): 29 ng/L — ABNORMAL HIGH (ref <18)

## 2022-09-14 MED ORDER — CARVEDILOL 25 MG PO TABS: 25.0000 mg | ORAL_TABLET | Freq: Two times a day (BID) | ORAL | 0 refills | Status: DC

## 2022-09-14 MED ORDER — FAMOTIDINE 20 MG PO TABS
20.0000 mg | ORAL_TABLET | Freq: Once | ORAL | Status: AC
  Administered 2022-09-14: 20 mg via ORAL
  Filled 2022-09-14: qty 1

## 2022-09-14 MED ORDER — OFLOXACIN 0.3 % OT SOLN: 5.0000 [drp] | Freq: Two times a day (BID) | OTIC | 0 refills | Status: AC

## 2022-09-14 MED ORDER — DOCUSATE SODIUM 50 MG/5ML PO LIQD
100.0000 mg | Freq: Once | ORAL | Status: AC
  Administered 2022-09-14: 100 mg via ORAL
  Filled 2022-09-14: qty 10

## 2022-09-14 MED ORDER — ASPIRIN 325 MG PO TABS
325.0000 mg | ORAL_TABLET | Freq: Once | ORAL | Status: AC
  Administered 2022-09-14: 325 mg via ORAL
  Filled 2022-09-14: qty 1

## 2022-09-14 MED ORDER — DOCUSATE SODIUM 100 MG PO CAPS
100.0000 mg | ORAL_CAPSULE | Freq: Once | ORAL | Status: DC
  Filled 2022-09-14: qty 1

## 2022-09-14 NOTE — ED Triage Notes (Signed)
Patient here with complaint of chest pain that started a few days ago, had an ablation on Monday last week for atrial fibrillation. Patient is alert, oriented, and in no apparent distress at this time.

## 2022-09-14 NOTE — ED Notes (Signed)
Walked patient down the hall at least 100 feet. He was between 87-100 while walking. O2 remained at 99. Patient reports feeling lightheaded while walking. Patient back in bed resting with cardiac monitor on and in NSR.

## 2022-09-14 NOTE — ED Provider Triage Note (Signed)
Emergency Medicine Provider Triage Evaluation Note  Jerry Shaffer , a 61 y.o. male  was evaluated in triage.  Pt complains of chest pain, palpitations that occurred just prior to arrival.  Patient's phone shows heart rates of about 120s to 130s.  Currently in triage patient's rate is in mid 90s.  EKG shows sinus rhythm.  Currently without chest pain.  Review of Systems  Positive: As above Negative: As above  Physical Exam  BP (!) 134/112 (BP Location: Right Arm)   Pulse 95   Temp 99.3 F (37.4 C) (Oral)   Resp 20   SpO2 98%  Gen:   Awake, no distress   Resp:  Normal effort  MSK:   Moves extremities without difficulty  Other:    Medical Decision Making  Medically screening exam initiated at 3:30 PM.  Appropriate orders placed.  [name] was informed that the remainder of the evaluation will be completed by another provider, this initial triage assessment does not replace that evaluation, and the importance of remaining in the ED until their evaluation is complete.     Evlyn Courier, PA-C 09/14/22 7972

## 2022-09-14 NOTE — ED Provider Notes (Signed)
[facility] EMERGENCY DEPARTMENT Provider Note   CSN: [id number] Arrival date & time: 09/14/22  1515     History  Chief Complaint  Patient presents with   Chest Pain    Jerry Shaffer is a 61 y.o. male with T2DM, migraine, GERD, hypertension, history of TIA, diverticulitis, headache, history NSTEMI, CAD status post PCI, a flutter presents with chest pain.   Patient had ablation last Monday for atrial fibrillation. He has been taking carvedilol 6.25 mg BID which was recently increased to 12.5 mg BID. He has noticed that when he so much as sits up or moves around, his HR increases into the 120-130s. Patient's phone shows heart rates of about 120s to 130s. This is accompanied by mild chest discomfort and a heart racing sensation with dizziness. Patient denies current symptoms. Denies f/c, cough, SOB, abd pain, N/V/D/C, leg swelling.    Chest Pain      Home Medications Prior to Admission medications   Medication Sig Start Date End Date Taking? Authorizing Provider  apixaban (ELIQUIS) 5 MG TABS tablet Take 1 tablet (5 mg total) by mouth 2 (two) times daily. 09/09/22   Baldwin Jamaica, PA-C  atorvastatin (LIPITOR) 80 MG tablet TAKE 1 TABLET BY MOUTH EVERY DAY Patient taking differently: Take 80 mg by mouth daily. 07/07/22   Lendon Colonel, NP  carvedilol (COREG) 6.25 MG tablet Take 6.25 mg by mouth 2 (two) times daily with a meal.    [provider]  clopidogrel (PLAVIX) 75 MG tablet TAKE 1 TABLET(75 MG) BY MOUTH DAILY WITH BREAKFAST Patient taking differently: Take 75 mg by mouth daily. 08/13/22   Croitoru, Mihai, MD  glipiZIDE (GLUCOTROL) 5 MG tablet Take 1 tablet (5 mg total) by mouth daily before breakfast. 08/26/22   Philemon Kingdom, MD  metFORMIN (GLUCOPHAGE-XR) 500 MG 24 hr tablet Take 4 tablets (2,000 mg total) by mouth daily. 08/26/22   Philemon Kingdom, MD  Semaglutide,0.25 or 0.'5MG'$ /DOS, (OZEMPIC, 0.25 OR 0.5 MG/DOSE,) 2 MG/3ML SOPN Inject 0.5  mg into the skin once a week.    [provider]      Allergies    Bee venom, Penicillins, Procaine, Isosorbide, and Tape    Review of Systems   Review of Systems  Cardiovascular:  Positive for chest pain.   Review of systems negative for f/c.  A 10 point review of systems was performed and is negative unless otherwise reported in HPI.  Physical Exam Updated Vital Signs BP (!) 134/112 (BP Location: Right Arm)   Pulse 95   Temp 99.3 F (37.4 C) (Oral)   Resp 20   SpO2 98%  Physical Exam General: Normal appearing obese male, lying in bed.  HEENT: PERRLA, Sclera anicteric, MMM, trachea midline, no JVD. Cardiology: RRR, no murmurs/rubs/gallops. BL radial and DP pulses equal bilaterally.  Resp: Normal respiratory rate and effort. CTAB, no wheezes, rhonchi, crackles.  Abd: Soft, non-tender, non-distended. No rebound tenderness or guarding.  GU: Deferred. MSK: No peripheral edema or signs of trauma. Extremities without deformity or TTP. No cyanosis or clubbing. Skin: warm, dry. No rashes or lesions. Back: No CVA tenderness Neuro: A&Ox4, CNs II-XII grossly intact. MAEs. Sensation grossly intact.  Psych: Normal mood and affect.   ED Results / Procedures / Treatments   Labs (all labs ordered are listed, but only abnormal results are displayed) Labs Reviewed  BASIC METABOLIC PANEL - Abnormal; Notable for the following components:      Result Value   Sodium 134 (*)  Glucose, Bld 280 (*)    BUN 26 (*)    All other components within normal limits  TROPONIN I (HIGH SENSITIVITY) - Abnormal; Notable for the following components:   Troponin I (High Sensitivity) 28 (*)    All other components within normal limits  CBC  TROPONIN I (HIGH SENSITIVITY)    EKG EKG Interpretation  Date/Time:  Sunday September 14 2022 15:25:37 EDT Ventricular Rate:  96 PR Interval:  148 QRS Duration: 80 QT Interval:  350 QTC Calculation: 442 R Axis:   44 Text Interpretation: Normal sinus  rhythm Normal ECG When compared with ECG of 09-Sep-2022 05:22, PREVIOUS ECG IS PRESENT Confirmed by Cindee Lame [id number]) on 09/14/2022 5:40:22 PM  Radiology DG Chest 2 View  Result Date: 09/14/2022 CLINICAL DATA:  Chest pain. EXAM: CHEST - 2 VIEW COMPARISON:  Chest radiograph 09/06/2022; CT angio chest 09/05/2022 FINDINGS: The heart size and mediastinal contours are within normal limits. Both lungs are clear. No pleural effusion or pneumothorax. Multilevel degenerative changes of the thoracic spine. IMPRESSION: No acute cardiopulmonary abnormality. Electronically Signed   By: Ileana Roup M.D.   On: 09/14/2022 16:49    Procedures Procedures    Medications Ordered in ED Medications  aspirin tablet 325 mg (325 mg Oral Given 09/14/22 1601)  famotidine (PEPCID) tablet 20 mg (20 mg Oral Given 09/14/22 2057)  docusate (COLACE) 50 MG/5ML liquid 100 mg (100 mg Oral Given 09/14/22 2228)    ED Course/ Medical Decision Making/ A&P                          Medical Decision Making Amount and/or Complexity of Data Reviewed Labs: ordered. Decision-making details documented in ED Course. Radiology: ordered.  Risk OTC drugs. Prescription drug management.    For patient's symptoms, consider afib w/ RVR, sinus tachycardia.    I have personally reviewed and interpreted all labs and imaging.   Clinical Course as of 09/14/22 2346  Sun Sep 14, 2022  1739 Sodium(!): 134 [HN]  1740 Glucose(!): 280 [HN]  1740 BUN(!): 26 [HN]  1740 Hemoglobin: 14.6 [HN]  1740 WBC: 9.9 [HN]  1740 Creatinine: 0.88 [HN]  1740 Troponin I (High Sensitivity)(!): 28 [HN]  1740 EKG w/ NSR 96 bpm, normal axis, normal intervals, no STEs/Ds.  [HN]  2043 Patient's walking test demonstrated max HR 100 bpm, sinus rhythm, he became slightly dizzy per report. Patient's BP stable, troponins flat, unremarkable lab values in the context of presentation. Patient's coreg dose recently increased from 6.25 mg BID to 12.5 mg BID. Will  increase again to 25 mg BID. New prescription sent. While discussing with patient his new dose of medication, he reported that he cannot hear very well out of his left hear and that it is painful. On examination, he has wax impacted on his ear drum and it is possibly ruptured. Will have RN remove wax and reexamine. [HN]    Clinical Course User Index [HN] Audley Hose, MD   11:49 PM RN attempted to remove wax with saline washes as well as colace. Unsuccessful. With my attempts to remove wax with curette, patient with extreme pain upon palpation of his ear canal. Patient likely with otitis externa. Will give him ofloxacin drops BID x 7 days and he is instructed to f/u with PCP. Patient states he doesn't have one so is instructed to call [facility] in the AM to set up with PCP. Also instructed to f/u with cardiologist within one  week about carvedilol increase.  Patient is given DC instructions/return precautions.   Dispo: DC in stable condition         Final Clinical Impression(s) / ED Diagnoses Final diagnoses:  Chest pain, unspecified type  Acute otitis externa of left ear, unspecified type    Rx / DC Orders ED Discharge Orders          Ordered    carvedilol (COREG) 25 MG tablet  2 times daily with meals        09/14/22 2038    ofloxacin (FLOXIN) 0.3 % OTIC solution  2 times daily        09/14/22 2330             This note was created using dictation software, which may contain spelling or grammatical errors.    Audley Hose, MD 09/14/22 [phone]

## 2022-09-14 NOTE — ED Notes (Signed)
Two attempts to irrigate ear without success.  MD notified.

## 2022-09-14 NOTE — Discharge Instructions (Addendum)
Thank you for coming to [facility] Emergency Department. You were seen for having tachycardia with moving around at home that is causing some dizziness and chest discomfort. We did an exam, labs, and imaging, and these showed no acute findings. We will increase your dose of carvedilol at home to 25 mg twice per day. Please call in the morning and  follow up with your cardiologist this week.   We also saw a potential perforated ear drum on the left ear impacted with wax. Please place 5 drops of ofloxacin otic solution into ear twice per day for 7 days. Please follow up about this with your primary care doctor.  Do not hesitate to return to the ED or call 911 if you experience: -Worsening symptoms -Chest pain, shortness of breath -Lightheadedness, passing out -Fevers/chills -Anything else that concerns you

## 2022-09-23 NOTE — Progress Notes (Unsigned)
Cardiology Office Note:    Date:  09/23/2022   ID:  Jerry Shaffer, DOB [birth year], MRN [id number]  PCP:  Patient, No Pcp Per   [facility] Cardiologist:  Sanda Klein, MD { Click to update primary MD,subspecialty MD or APP then REFRESH:1}    Referring MD: Elgie Congo, MD   No chief complaint on file. ***  History of Present Illness:    Jerry Shaffer is a 61 y.o. male with a hx of CAD with inferior NSTEMI on 09/20/2020 with DES to mid PDA.  Subsequent unstable angina brought him back to the Cath Lab where he underwent DES to proximal LAD on 03/28/2021.  Residual moderate stenosis to the mid LAD and mild stenosis in the diagonal artery and 50% mid circumflex stenosis.  History also significant for T2DM, dyslipidemia, hypertension, and stroke.  Most recently, patient underwent successful radiofrequency ablation for atrial flutter by Dr. Myles Gip.  Patient was seen in the ED on 09/14/2022 for chest pain and palpitations.  He reported heart rate 120s to 130s with accompanying dizziness prior to arriving to the emergency room.  When he he was triaged in the ED his heart rate was in the mid 90s and his EKG showed sinus rhythm.  He was free of chest pain at that time.  Serial troponins were 28 and 29. He tested positive for COVID-19 and was diagnosed with acute otitis externa of the left ear and discharged.   Patient presents today ***  Past Medical History:  Diagnosis Date   Atrial flutter ([place])    Bipolar 1 disorder ([place])    Coronary artery disease    Diabetes mellitus    Dyslipidemia    Emphysema    Hypertension    Mental disorder    BIPOLAR DISORDER   Migraine    NSTEMI (non-ST elevated myocardial infarction) ([place]) 09/20/2020   PONV (postoperative nausea and vomiting)    Renal disorder    Shortness of breath    Stroke ([place])    Transient ischemic attack (TIA)     Past Surgical History:  Procedure Laterality Date   A-FLUTTER ABLATION N/A 09/08/2022    Procedure: A-FLUTTER ABLATION;  Surgeon: Mealor, Yetta Barre, MD;  Location: [facility];  Service: Cardiovascular;  Laterality: N/A;   COLONOSCOPY WITH PROPOFOL N/A 10/17/2021   Procedure: COLONOSCOPY WITH PROPOFOL;  Surgeon: Milus Banister, MD;  Location: [facility];  Service: Endoscopy;  Laterality: N/A;   CORONARY ANGIOGRAPHY  09/20/2020   CORONARY STENT INTERVENTION  09/20/2020   CORONARY STENT INTERVENTION   CORONARY STENT INTERVENTION N/A 09/20/2020   Procedure: CORONARY STENT INTERVENTION;  Surgeon: Belva Crome, MD;  Location: [facility];  Service: Cardiovascular;  Laterality: N/A;   CORONARY STENT INTERVENTION N/A 03/28/2021   Procedure: CORONARY STENT INTERVENTION;  Surgeon: [name], MD;  Location: [facility];  Service: Cardiovascular;  Laterality: N/A;   HEMORROIDECTOMY     KNEE ARTHROSCOPY     LEFT HEART CATH AND CORONARY ANGIOGRAPHY N/A 09/20/2020   Procedure: LEFT HEART CATH AND CORONARY ANGIOGRAPHY;  Surgeon: Belva Crome, MD;  Location: [facility];  Service: Cardiovascular;  Laterality: N/A;   LEFT HEART CATH AND CORONARY ANGIOGRAPHY N/A 03/28/2021   Procedure: LEFT HEART CATH AND CORONARY ANGIOGRAPHY;  Surgeon: [name], MD;  Location: [facility];  Service: Cardiovascular;  Laterality: N/A;   LUMBAR DISC SURGERY     POLYPECTOMY  10/17/2021   Procedure:  POLYPECTOMY;  Surgeon: Milus Banister, MD;  Location: [facility];  Service: Endoscopy;;   TONSILLECTOMY AND ADENOIDECTOMY      Current Medications: No outpatient medications have been marked as taking for the 09/24/22 encounter (Appointment) with Deberah Pelton, NP.     Allergies:   Bee venom, Penicillins, Procaine, Isosorbide, and Tape   Social History   Socioeconomic History   Marital status: Single    Spouse name: Not on file   Number of children: Not on file   Years of education: Not on file   Highest education level: Not on file  Occupational  History   Occupation: Security guard    Employer: [employer]  Tobacco Use   Smoking status: Former    Types: E-cigarettes   Smokeless tobacco: Current  Vaping Use   Vaping Use: Never used  Substance and Sexual Activity   Alcohol use: No   Drug use: No   Sexual activity: Not Currently  Other Topics Concern   Not on file  Social History Narrative   Not on file   Social Determinants of Health   Financial Resource Strain: Not on file  Food Insecurity: Food Insecurity Present (09/09/2022)   Hunger Vital Sign    Worried About Running Out of Food in the Last Year: Sometimes true    Ran Out of Food in the Last Year: Never true  Transportation Needs: No Transportation Needs (09/06/2022)   PRAPARE - Hydrologist (Medical): No    Lack of Transportation (Non-Medical): No  Physical Activity: Not on file  Stress: Not on file  Social Connections: Not on file     Family History: The patient's ***family history includes Bipolar disorder in an other family member; CAD (age of onset: 65) in his mother; Coronary artery disease in an other family member; Diabetes type II in an other family member; Lung cancer in his father.  ROS:   Please see the history of present illness.    *** All other systems reviewed and are negative.  EKGs/Labs/Other Studies Reviewed:    The following studies were reviewed today: Echo  09/20/20 IMPRESSIONS    1. Left ventricular ejection fraction, by estimation, is 55 to 60%. The  left ventricle has normal function. The left ventricle has no regional  wall motion abnormalities. There is severe left ventricular hypertrophy.  Left ventricular diastolic parameters   are consistent with Grade I diastolic dysfunction (impaired relaxation).   2. Right ventricular systolic function is normal. The right ventricular  size is normal. Tricuspid regurgitation signal is inadequate for assessing  PA pressure.   3. The mitral valve is normal  in structure. No evidence of mitral valve  regurgitation. No evidence of mitral stenosis.   4. The aortic valve is tricuspid. Aortic valve regurgitation is not  visualized. No aortic stenosis is present.   5. Aortic dilatation noted. There is mild dilatation of the aortic root,  measuring 38 mm.   6. The inferior vena cava is normal in size with greater than 50%  respiratory variability, suggesting right atrial pressure of 3 mmHg.   09/06/22 IMPRESSIONS    1. Left ventricular ejection fraction, by estimation, is 60 to 65%. The  left ventricle has normal function. The left ventricle has no regional  wall motion abnormalities. There is mild left ventricular hypertrophy.  Left ventricular diastolic parameters  are indeterminate.   2. Right ventricular systolic function is normal. The right ventricular  size is normal.  3. Left atrial size was mildly dilated.   4. The mitral valve is normal in structure. No evidence of mitral valve  regurgitation. No evidence of mitral stenosis.   5. The aortic valve was not well visualized. Aortic valve regurgitation  is not visualized. No aortic stenosis is present.   6. The inferior vena cava is normal in size with greater than 50%  respiratory variability, suggesting right atrial pressure of 3 mmHg.  Left Heart Cath:  09/20/21 Conclusion Total occlusion of the mid PDA (a large vessel that extends to the left ventricular apex), filling sluggishly by apical LAD to PDA collaterals.  This vessel is felt to represent the culprit for the patient's presentation. Total occlusion of the mid PDA reduced to 0% using an 18 x 2.75 mm Onyx deployed at 12 atm x 2 inflations.  TIMI grade III flow was noted.  Resolution of chest pain occurred.  Proximal to the stented segment there is a 30 to 40% stenosis in the PDA. Left main is widely patent LAD contains mid vessel disease with a Medina 011 bifurcation stenosis.  LAD is 70% and diagonal is 40%.  This can be managed  medically. Circumflex contains segmental 40 to 50% stenosis in the large second obtuse marginal. RCA is dominant, tortuous, and no significant disease other than that noted above in the PDA. Mid inferior wall to apical akinesis.  LVEDP 6 mmHg.  EF 45 to 50%.  03/28/21 Conclusion 1.  Patent left main 2.  Severe stenosis of the proximal LAD, new from cardiac catheterization 6 months ago, treated successfully with PCI using a 3.5 x 20 mm Synergy DES.  The mid LAD is unchanged from the prior study with Medina 0, 1, 1 bifurcation disease with only moderate stenosis in the LAD and mild stenosis in the diagonal 3.  Moderate 50% mid circumflex stenosis 4.  Continued patency of the right PDA stent with mild diffuse nonobstructive PDA and RCA stenosis 5.  Normal LVEDP  EKG:  EKG is *** ordered today.  The ekg ordered today demonstrates ***  Recent Labs: 09/05/2022: ALT 22; B Natriuretic Peptide 101.3 09/06/2022: TSH 0.853 09/09/2022: Magnesium 1.8 09/14/2022: BUN 26; Creatinine, Ser 0.88; Hemoglobin 14.6; Platelets 237; Potassium 4.4; Sodium 134  Recent Lipid Panel    Component Value Date/Time   CHOL 85 09/06/2022 0226   CHOL 87 (L) 08/15/2021 0839   TRIG 169 (H) 09/06/2022 0226   HDL 23 (L) 09/06/2022 0226   HDL 26 (L) 08/15/2021 0839   CHOLHDL 3.7 09/06/2022 0226   VLDL 34 09/06/2022 0226   LDLCALC 28 09/06/2022 0226   LDLCALC 45 08/15/2021 0839     Risk Assessment/Calculations:   {Does this patient have ATRIAL FIBRILLATION?[phone]}  No BP recorded.  {Refresh Note OR Click here to enter BP  :1}***         Physical Exam:    VS:  There were no vitals taken for this visit.    Wt Readings from Last 3 Encounters:  09/14/22 251 lb 5.2 oz (114 kg)  09/09/22 252 lb 4.8 oz (114.4 kg)  09/05/22 258 lb (117 kg)     GEN: *** Well nourished, well developed in no acute distress HEENT: Normal NECK: No JVD; No carotid bruits LYMPHATICS: No lymphadenopathy CARDIAC: ***RRR, no murmurs,  rubs, gallops RESPIRATORY:  Clear to auscultation without rales, wheezing or rhonchi  ABDOMEN: Soft, non-tender, non-distended MUSCULOSKELETAL:  No edema; No deformity  SKIN: Warm and dry NEUROLOGIC:  Alert and oriented x  3 PSYCHIATRIC:  Normal affect   ASSESSMENT:    No diagnosis found. PLAN:    In order of problems listed above:  ***      {Are you ordering a CV Procedure (e.g. stress test, cath, DCCV, TEE, etc)?   Press F2        :[id number]}    Medication Adjustments/Labs and Tests Ordered: Current medicines are reviewed at length with the patient today.  Concerns regarding medicines are outlined above.  No orders of the defined types were placed in this encounter.  No orders of the defined types were placed in this encounter.   There are no Patient Instructions on file for this visit.   Signed, Mayra Reel, NP  09/23/2022 12:48 PM    [place]

## 2022-09-24 ENCOUNTER — Encounter: Payer: Self-pay | Admitting: General Practice

## 2022-09-24 ENCOUNTER — Ambulatory Visit: Payer: PRIVATE HEALTH INSURANCE | Attending: General Practice | Admitting: Student

## 2022-09-24 VITALS — BP 130/72 | HR 84 | Ht 68.0 in | Wt 255.6 lb

## 2022-09-24 DIAGNOSIS — I480 Paroxysmal atrial fibrillation: Secondary | ICD-10-CM | POA: Diagnosis not present

## 2022-09-24 DIAGNOSIS — E785 Hyperlipidemia, unspecified: Secondary | ICD-10-CM

## 2022-09-24 DIAGNOSIS — I10 Essential (primary) hypertension: Secondary | ICD-10-CM | POA: Diagnosis not present

## 2022-09-24 DIAGNOSIS — I2510 Atherosclerotic heart disease of native coronary artery without angina pectoris: Secondary | ICD-10-CM

## 2022-09-24 DIAGNOSIS — Z9861 Coronary angioplasty status: Secondary | ICD-10-CM

## 2022-09-24 DIAGNOSIS — K219 Gastro-esophageal reflux disease without esophagitis: Secondary | ICD-10-CM | POA: Diagnosis not present

## 2022-09-24 NOTE — Patient Instructions (Signed)
Medication Instructions:  The current medical regimen is effective;  continue present plan and medications as directed. Please refer to the Current Medication list given to you today.  *If you need a refill on your cardiac medications before your next appointment, please call your pharmacy*   Lab Work: CBC, BMET BEFORE LEXISCAN If you have labs (blood work) drawn today and your tests are completely normal, you will receive your results only by: [place] (if you have MyChart) OR  A paper copy in the mail  If you have any lab test that is abnormal or we need to change your treatment, we will call you to review the results.   Testing/Procedures: LEXISCAN=Your physician has requested that you have a [employer], is a chemical stress test. Please follow instruction sheet, as given.  The Myoview Stress Test is a diagnostic test to evaluate/detect the presence of early heart disease, to assess your functional capacity, or to update the status of your coronary circulation following a cardiac event.   Follow-Up: At [facility], you and your health needs are our priority.  As part of our continuing mission to provide you with exceptional heart care, we have created designated Provider Care Teams.  These Care Teams include your primary Cardiologist (physician) and Advanced Practice Providers (APPs -  Physician Assistants and Nurse Practitioners) who all work together to provide you with the care you need, when you need it.  We recommend signing up for the patient portal called "MyChart".  Sign up information is provided on this After Visit Summary.  MyChart is used to connect with patients for Virtual Visits (Telemedicine).  Patients are able to view lab/test results, encounter notes, upcoming appointments, etc.  Non-urgent messages can be sent to your provider as well.   To learn more about what you can do with MyChart, go to [email or web address].    Your next appointment:   3-4  month(s)  The format for your next appointment:   In Person  Provider:   Sanda Klein, MD  or Coletta Memos, FNP        Important Information About Sugar

## 2022-09-30 ENCOUNTER — Ambulatory Visit (HOSPITAL_COMMUNITY): Payer: PRIVATE HEALTH INSURANCE

## 2022-09-30 LAB — BASIC METABOLIC PANEL
BUN/Creatinine Ratio: 25 — ABNORMAL HIGH (ref 9–20)
BUN: 19 mg/dL (ref 6–24)
CO2: 24 mmol/L (ref 20–29)
Calcium: 9.1 mg/dL (ref 8.7–10.2)
Chloride: 103 mmol/L (ref 96–106)
Creatinine, Ser: 0.77 mg/dL (ref 0.76–1.27)
Glucose: 162 mg/dL — ABNORMAL HIGH (ref 70–99)
Potassium: 4.2 mmol/L (ref 3.5–5.2)
Sodium: 138 mmol/L (ref 134–144)
eGFR: 103 mL/min/{1.73_m2} (ref >59)

## 2022-09-30 LAB — CBC
Hematocrit: 37.5 % (ref 37.5–51.0)
Hemoglobin: 12.3 g/dL — ABNORMAL LOW (ref 13.0–17.7)
MCH: 27.8 pg (ref 26.6–33.0)
MCHC: 32.8 g/dL (ref 31.5–35.7)
MCV: 85 fL (ref 79–97)
Platelets: 213 10*3/uL (ref 150–450)
RBC: 4.42 x10E6/uL (ref 4.14–5.80)
RDW: 13.2 % (ref 11.6–15.4)
WBC: 7 10*3/uL (ref 3.4–10.8)

## 2022-10-02 ENCOUNTER — Telehealth (HOSPITAL_COMMUNITY): Payer: Self-pay | Admitting: *Deleted

## 2022-10-02 NOTE — Telephone Encounter (Signed)
Close encounter 

## 2022-10-03 ENCOUNTER — Encounter (HOSPITAL_COMMUNITY): Payer: PRIVATE HEALTH INSURANCE

## 2022-10-03 ENCOUNTER — Ambulatory Visit (HOSPITAL_COMMUNITY)
Admission: RE | Admit: 2022-10-03 | Discharge: 2022-10-03 | Disposition: A | Discharge status: Home / Self Care | Payer: 59 | Source: Ambulatory Visit | Attending: Cardiovascular Disease | Admitting: Cardiovascular Disease

## 2022-10-03 DIAGNOSIS — I25118 Atherosclerotic heart disease of native coronary artery with other forms of angina pectoris: Secondary | ICD-10-CM | POA: Diagnosis not present

## 2022-10-03 LAB — MYOCARDIAL PERFUSION IMAGING
LV dias vol: 154 mL (ref 62–150)
LV sys vol: 72 mL
Nuc Stress EF: 53 %
Peak HR: 99 {beats}/min
Rest HR: 80 {beats}/min
Rest Nuclear Isotope Dose: 10.7 mCi
SDS: 2
SRS: 3
SSS: 5
ST Depression (mm): 0 mm
Stress Nuclear Isotope Dose: 32.6 mCi
TID: 1.22

## 2022-10-03 MED ORDER — REGADENOSON 0.4 MG/5ML IV SOLN
0.4000 mg | Freq: Once | INTRAVENOUS | Status: AC
  Administered 2022-10-03: 0.4 mg via INTRAVENOUS

## 2022-10-03 MED ORDER — TECHNETIUM TC 99M TETROFOSMIN IV KIT
10.7000 | PACK | Freq: Once | INTRAVENOUS | Status: AC | PRN
  Administered 2022-10-03: 10.7 via INTRAVENOUS

## 2022-10-03 MED ORDER — TECHNETIUM TC 99M TETROFOSMIN IV KIT
32.6000 | PACK | Freq: Once | INTRAVENOUS | Status: AC | PRN
  Administered 2022-10-03: 32.6 via INTRAVENOUS

## 2022-10-06 ENCOUNTER — Telehealth: Payer: Self-pay | Admitting: Internal Medicine

## 2022-10-06 DIAGNOSIS — E1165 Type 2 diabetes mellitus with hyperglycemia: Secondary | ICD-10-CM

## 2022-10-06 MED ORDER — GLIPIZIDE 5 MG PO TABS: 5.0000 mg | ORAL_TABLET | Freq: Every day | ORAL | 3 refills | Status: DC

## 2022-10-06 NOTE — Telephone Encounter (Signed)
MEDICATION: glipiZIDE (GLUCOTROL) 5 MG tablet  PHARMACY:  [id number] - [place], [place] (Ph: [phone])  HAS THE PATIENT CONTACTED THEIR PHARMACY?  Yes  IS THIS A 90 DAY SUPPLY : Yes  IS PATIENT OUT OF MEDICATION: No  IF NOT; HOW MUCH IS LEFT: 1 Week worth  LAST APPOINTMENT DATE: '@9'$ /11/2022  NEXT APPOINTMENT DATE:'@11'$ /21/2023  DO WE HAVE YOUR PERMISSION TO LEAVE A DETAILED MESSAGE?: Yes  OTHER COMMENTS: Patient is leaving to go out of state Friday, 10/11/2022 and will be out of town when current medications run out.  Also patient brought in paper work to be completed for [employer] Patient Assistance Program   **Let patient know to contact pharmacy at the end of the day to make sure medication is ready. **  ** Please notify patient to allow 48-72 hours to process**  **Encourage patient to contact the pharmacy for refills or they can request refills through [facility]**

## 2022-10-06 NOTE — Telephone Encounter (Signed)
Forms received.  

## 2022-10-06 NOTE — Telephone Encounter (Signed)
Patient brought by patient assistance forms to be completed and they are in Dr. Arman Filter box in front office

## 2022-10-06 NOTE — Telephone Encounter (Signed)
RX sent to preferred pharmacy 

## 2022-10-07 ENCOUNTER — Other Ambulatory Visit (HOSPITAL_COMMUNITY): Payer: Self-pay

## 2022-10-07 ENCOUNTER — Telehealth (HOSPITAL_COMMUNITY): Payer: Self-pay

## 2022-10-07 NOTE — Telephone Encounter (Signed)
Transitions of Care Pharmacy   Call attempted for a pharmacy transitions of care follow-up. HIPAA appropriate voicemail was left with call back information provided.   Call attempt #1. Will follow-up in 2-3 days.   Vanya Carberry E. Marsh, PharmD PGY-1 Community Pharmacy Resident    

## 2022-10-09 ENCOUNTER — Telehealth: Payer: Self-pay | Admitting: *Deleted

## 2022-10-09 NOTE — Telephone Encounter (Signed)
Patient wanted the results of myoview. Patient states he had deleted the message on his machine.   RN gave the patient results . Patient voiced understanding.   Patient states he will have some FMLA papers for Dr C to fill out. Rn informed patient of protocol for forms to be completed. Patient voiced understanding.

## 2022-10-13 ENCOUNTER — Ambulatory Visit: Payer: PRIVATE HEALTH INSURANCE | Admitting: Cardiovascular Disease

## 2022-10-24 ENCOUNTER — Telehealth: Payer: Self-pay | Admitting: *Deleted

## 2022-10-24 NOTE — Telephone Encounter (Signed)
FMLA forms completed  Left message for patient to call back

## 2022-10-28 ENCOUNTER — Ambulatory Visit: Payer: PRIVATE HEALTH INSURANCE | Attending: Cardiovascular Disease | Admitting: Cardiovascular Disease

## 2022-10-28 ENCOUNTER — Encounter: Payer: Self-pay | Admitting: Cardiovascular Disease

## 2022-10-28 VITALS — BP 136/80 | HR 85 | Ht 68.0 in | Wt 256.0 lb

## 2022-10-28 DIAGNOSIS — I4892 Unspecified atrial flutter: Secondary | ICD-10-CM | POA: Diagnosis not present

## 2022-10-28 NOTE — Progress Notes (Signed)
Electrophysiology Office Note:    Date:  10/28/2022   ID:  Jerry [facility], DOB June 12, 1961, MRN [id number]  PCP:  Patient, No Pcp Per   [facility] Cardiologist:  Sanda Klein, MD Electrophysiologist:  Melida Quitter, MD     Referring MD: No ref. provider found   Chief complaint: follow-up after ablation.  History of Present Illness:    Jerry [facility] is a 61 y.o. male with a hx of CAD s/p PCI in 2021 and 2022 HTN, DM, bipolar disorder, TIA who presents for EP follow-up. He has had recurrent admissions for typical atrial flutter with 2:1 conduction resulting in chest pain and diaphoresis. He underwent ablation of atrial flutter on sept 25, 2023 while admitted for this issue.  Since the ablation, he has not had recurrence of these symptoms. He does have a sharp chest pain. He recently had a nuclear stress test that was low risk.  Past Medical History:  Diagnosis Date   Atrial flutter ([facility])    Bipolar 1 disorder ([facility])    Coronary artery disease    Diabetes mellitus    Dyslipidemia    Emphysema    Hypertension    Mental disorder    BIPOLAR DISORDER   Migraine    NSTEMI (non-ST elevated myocardial infarction) (Jerry [facility]) 09/20/2020   PONV (postoperative nausea and vomiting)    Renal disorder    Shortness of breath    Stroke (Jerry [facility])    Transient ischemic attack (TIA)     Past Surgical History:  Procedure Laterality Date   A-FLUTTER ABLATION N/A 09/08/2022   Procedure: A-FLUTTER ABLATION;  Surgeon: Melida Quitter, MD;  Location: [facility];  Service: Cardiovascular;  Laterality: N/A;   COLONOSCOPY WITH PROPOFOL N/A 10/17/2021   Procedure: COLONOSCOPY WITH PROPOFOL;  Surgeon: Milus Banister, MD;  [facility];  Service: Endoscopy;  Laterality: N/A;   CORONARY ANGIOGRAPHY  09/20/2020   CORONARY STENT INTERVENTION  09/20/2020   CORONARY STENT INTERVENTION   CORONARY STENT INTERVENTION N/A 09/20/2020   Procedure: CORONARY STENT  INTERVENTION;  Surgeon: Belva Crome, MD;  Location: [facility];  Service: Cardiovascular;  Laterality: N/A;   CORONARY STENT INTERVENTION N/A 03/28/2021   Procedure: CORONARY STENT INTERVENTION;  Surgeon: [name], MD;  Location: [facility];  Service: Cardiovascular;  Laterality: N/A;   HEMORROIDECTOMY     KNEE ARTHROSCOPY     LEFT HEART CATH AND CORONARY ANGIOGRAPHY N/A 09/20/2020   Procedure: LEFT HEART CATH AND CORONARY ANGIOGRAPHY;  Surgeon: Belva Crome, MD;  Location: [facility];  Service: Cardiovascular;  Laterality: N/A;   LEFT HEART CATH AND CORONARY ANGIOGRAPHY N/A 03/28/2021   Procedure: LEFT HEART CATH AND CORONARY ANGIOGRAPHY;  Surgeon: [name], MD;  Location: [facility];  Service: Cardiovascular;  Laterality: N/A;   LUMBAR DISC SURGERY     POLYPECTOMY  10/17/2021   Procedure: POLYPECTOMY;  Surgeon: Milus Banister, MD;  [facility];  Service: Endoscopy;;   TONSILLECTOMY AND ADENOIDECTOMY      Current Medications: Current Meds  Medication Sig   apixaban (ELIQUIS) 5 MG TABS tablet Take 1 tablet (5 mg total) by mouth 2 (two) times daily.   atorvastatin (LIPITOR) 80 MG tablet TAKE 1 TABLET BY MOUTH EVERY DAY   carvedilol (COREG) 25 MG tablet Take 1 tablet (25 mg total) by mouth 2 (two) times daily with a meal.   clopidogrel (PLAVIX) 75 MG tablet TAKE 1  TABLET(75 MG) BY MOUTH DAILY WITH BREAKFAST   glipiZIDE (GLUCOTROL) 5 MG tablet Take 1 tablet (5 mg total) by mouth daily before breakfast.   metFORMIN (GLUCOPHAGE-XR) 500 MG 24 hr tablet Take 4 tablets (2,000 mg total) by mouth daily.   Semaglutide,0.25 or 0.'5MG'$ /DOS, (OZEMPIC, 0.25 OR 0.5 MG/DOSE,) 2 MG/3ML SOPN Inject 0.5 mg into the skin once a week.     Allergies:   Bee venom, Penicillins, Procaine, Isosorbide, and Tape   Social History   Socioeconomic History   Marital status: Single    Spouse name: Not on file   Number of children: Not on file   Years of  education: Not on file   Highest education level: Not on file  Occupational History   Occupation: Security guard    Employer: [employer]  Tobacco Use   Smoking status: Former    Types: E-cigarettes   Smokeless tobacco: Current  Vaping Use   Vaping Use: Never used  Substance and Sexual Activity   Alcohol use: No   Drug use: No   Sexual activity: Not Currently  Other Topics Concern   Not on file  Social History Narrative   Not on file   Social Determinants of Health   Financial Resource Strain: Not on file  Food Insecurity: Food Insecurity Present (09/09/2022)   Hunger Vital Sign    Worried About Running Out of Food in the Last Year: Sometimes true    Ran Out of Food in the Last Year: Never true  Transportation Needs: No Transportation Needs (09/06/2022)   PRAPARE - Hydrologist (Medical): No    Lack of Transportation (Non-Medical): No  Physical Activity: Not on file  Stress: Not on file  Social Connections: Not on file     Family History: The patient's family history includes Bipolar disorder in an other family member; CAD (age of onset: 22) in his mother; Coronary artery disease in an other family member; Diabetes type II in an other family member; Lung cancer in his father.  ROS:   Please see the history of present illness.    All other systems reviewed and are negative.  EKGs/Labs/Other Studies Reviewed Today:     EKG:  Last EKG results: today - normal sinus rhythm   Recent Labs: 09/05/2022: ALT 22; B Natriuretic Peptide 101.3 09/06/2022: TSH 0.853 09/09/2022: Magnesium 1.8 09/29/2022: BUN 19; Creatinine, Ser 0.77; Hemoglobin 12.3; Platelets 213; Potassium 4.2; Sodium 138     Physical Exam:    VS:  BP 136/80   Pulse 85   Ht '5\' 8"'$  (1.727 m)   Wt 256 lb (116.1 kg)   SpO2 96%   BMI 38.92 kg/m     Wt Readings from Last 3 Encounters:  10/28/22 256 lb (116.1 kg)  10/03/22 255 lb (115.7 kg)  09/24/22 255 lb 9.6 oz (115.9  kg)     GEN:  Well nourished, well developed in no acute distress obese CARDIAC: RRR, no murmurs, rubs, gallops RESPIRATORY:  Normal work of breathing MUSCULOSKELETAL: no edema    ASSESSMENT & PLAN:    Typical atrial flutter: s/p ablation without evidence of recurrence. Continue anticoagulation. He may continue to follow-up in general cardiology clinic. I will be happy to see him again if he has recurrence of flutter or develops new rhythm issues. Secondary hypercoagulable state: continue apixaban        Medication Adjustments/Labs and Tests Ordered: Current medicines are reviewed at length with the patient today.  Concerns regarding medicines are outlined above.  Orders Placed This Encounter  Procedures   EKG 12-Lead   No orders of the defined types were placed in this encounter.    Signed, Melida Quitter, MD  10/28/2022 10:39 AM    [place]

## 2022-10-28 NOTE — Telephone Encounter (Signed)
Paperwork placed at front desk for patient to pick up (patient made aware at appt with Dr. Myles Gip)

## 2022-11-04 ENCOUNTER — Ambulatory Visit (INDEPENDENT_AMBULATORY_CARE_PROVIDER_SITE_OTHER): Payer: PRIVATE HEALTH INSURANCE | Admitting: Internal Medicine

## 2022-11-04 ENCOUNTER — Encounter: Payer: Self-pay | Admitting: Internal Medicine

## 2022-11-04 VITALS — BP 130/88 | HR 92 | Ht 68.0 in | Wt 256.0 lb

## 2022-11-04 DIAGNOSIS — E1165 Type 2 diabetes mellitus with hyperglycemia: Secondary | ICD-10-CM

## 2022-11-04 DIAGNOSIS — E785 Hyperlipidemia, unspecified: Secondary | ICD-10-CM

## 2022-11-04 DIAGNOSIS — E1159 Type 2 diabetes mellitus with other circulatory complications: Secondary | ICD-10-CM | POA: Diagnosis not present

## 2022-11-04 LAB — POCT GLYCOSYLATED HEMOGLOBIN (HGB A1C): Hemoglobin A1C: 8.3 % — AB (ref 4.0–5.6)

## 2022-11-04 NOTE — Patient Instructions (Addendum)
Please continue: - Metformin ER 1000 mg 2x daily - Glipizide 5 mg before b'fast - Ozempic 0.25 mg weekly in a.m.  Please return in 3-4 months with your sugar log.

## 2022-11-04 NOTE — Progress Notes (Signed)
Patient ID: Jerry [facility], male   DOB: [birth year], 61 y.o.   MRN: [id number]  HPI: Jerry [facility] is a 61 y.o.-year-old male, returning for follow-up for DM2, dx in 2013, non insulin-dependent (on insulin briefly in 2016, and then again in 2021, but off now), uncontrolled, with complications (CAD, TIA, PN). Pt. previously saw Dr. Loanne Drilling, but last visit with me 2.5 months ago.  Interim history: No increased urination, blurry vision, nausea, chest pain. Since last visit, he was in the ED with A-fib with RVR 09/06/2022. He had to have an ablation. He had Covid after this. He started Ozempic 1 month ago - he has acid reflux. Takes Gaviscon. He needs clearance for shoulder surgery at today's visit.  Reviewed HbA1c: Lab Results  Component Value Date   HGBA1C 8.6 (H) 09/06/2022   HGBA1C 9.0 (A) 08/26/2022   HGBA1C 10.0 (A) 04/18/2022   HGBA1C 7.8 (A) 01/17/2022   HGBA1C 7.3 (A) 10/11/2021   HGBA1C 7.1 (A) 07/12/2021   HGBA1C 6.1 (A) 04/19/2021   HGBA1C 9.4 (A) 01/11/2021   HGBA1C 11.9 (H) 09/20/2020   HGBA1C 7.0 (H) 05/03/2013   Pt is on a regimen of: - Metformin ER 1000 mg 2x daily >> occas. Loose stools - Glipizide 5 mg before bedtime >> before breakfast - Ozempic 0.25 mg - started ~1 mo ago He has needle phobia with syringes.  He has no problems injecting insulin with pens. He did not want to try Farxiga due to fear of side effects. He was on Trulicity >> could not obtain it.  He was on [place] >> not affordable.  Pt checks his sugars 2-3x a day and they are: - am: going to bed: 120-200s >> 120-130 - 2h after b'fast: n/c - before lunch: n/c - 2h after lunch: n/c - before dinner: waking up: n/c - 30-45 min after dinner(PB or ham sandwich): 130-192, 200s >> 130, ave 145, 213 - bedtime: n/c - nighttime: n/c Lowest sugar was 22 (many years ago - on 10 mg Glipizide then) >> 90; he has hypoglycemia awareness at 70.  Highest sugar was 999 (long time ago)  >>  213.  Glucometer:?  - no CKD, last BUN/creatinine:  Lab Results  Component Value Date   BUN 19 09/29/2022   BUN 26 (H) 09/14/2022   CREATININE 0.77 09/29/2022   CREATININE 0.88 09/14/2022  He is not on ACE inhibitor/ARB.  - He has HL; last set of lipids: Lab Results  Component Value Date   CHOL 85 09/06/2022   HDL 23 (L) 09/06/2022   LDLCALC 28 09/06/2022   TRIG 169 (H) 09/06/2022   CHOLHDL 3.7 09/06/2022  On Lipitor 80 mg daily.  - last eye exam was "several years ago". No DR reportedly.   - + numbness and tingling in his feet.  Last foot exam 10/11/2021.  He also has a history of bipolar disease, emphysema, migraines.  ROS: + see HPI  Past Medical History:  Diagnosis Date   Atrial flutter ([place])    Bipolar 1 disorder ([place])    Coronary artery disease    Diabetes mellitus    Dyslipidemia    Emphysema    Hypertension    Mental disorder    BIPOLAR DISORDER   Migraine    NSTEMI (non-ST elevated myocardial infarction) ([place]) 09/20/2020   PONV (postoperative nausea and vomiting)    Renal disorder    Shortness of breath    Stroke ([place])    Transient ischemic attack (TIA)  Past Surgical History:  Procedure Laterality Date   A-FLUTTER ABLATION N/A 09/08/2022   Procedure: A-FLUTTER ABLATION;  Surgeon: Mealor, Yetta Barre, MD;  Location: [facility];  Service: Cardiovascular;  Laterality: N/A;   COLONOSCOPY WITH PROPOFOL N/A 10/17/2021   Procedure: COLONOSCOPY WITH PROPOFOL;  Surgeon: Milus Banister, MD;  [facility];  Service: Endoscopy;  Laterality: N/A;   CORONARY ANGIOGRAPHY  09/20/2020   CORONARY STENT INTERVENTION  09/20/2020   CORONARY STENT INTERVENTION   CORONARY STENT INTERVENTION N/A 09/20/2020   Procedure: CORONARY STENT INTERVENTION;  Surgeon: Belva Crome, MD;  Location: [facility];  Service: Cardiovascular;  Laterality: N/A;   CORONARY STENT INTERVENTION N/A 03/28/2021   Procedure: CORONARY STENT INTERVENTION;  Surgeon:  [name], MD;  Location: [facility];  Service: Cardiovascular;  Laterality: N/A;   HEMORROIDECTOMY     KNEE ARTHROSCOPY     LEFT HEART CATH AND CORONARY ANGIOGRAPHY N/A 09/20/2020   Procedure: LEFT HEART CATH AND CORONARY ANGIOGRAPHY;  Surgeon: Belva Crome, MD;  Location: [facility];  Service: Cardiovascular;  Laterality: N/A;   LEFT HEART CATH AND CORONARY ANGIOGRAPHY N/A 03/28/2021   Procedure: LEFT HEART CATH AND CORONARY ANGIOGRAPHY;  Surgeon: [name], MD;  Location: [facility];  Service: Cardiovascular;  Laterality: N/A;   LUMBAR DISC SURGERY     POLYPECTOMY  10/17/2021   Procedure: POLYPECTOMY;  Surgeon: Milus Banister, MD;  [facility];  Service: Endoscopy;;   TONSILLECTOMY AND ADENOIDECTOMY     Social History   Socioeconomic History   Marital status: Single    Spouse name: Not on file   Number of children: Not on file   Years of education: Not on file   Highest education level: Not on file  Occupational History   Occupation: Security guard    Employer: [employer]  Tobacco Use   Smoking status: Former    Types: E-cigarettes   Smokeless tobacco: Current  Vaping Use   Vaping Use: Never used  Substance and Sexual Activity   Alcohol use: No   Drug use: No   Sexual activity: Not Currently  Other Topics Concern   Not on file  Social History Narrative   Not on file   Social Determinants of Health   Financial Resource Strain: Not on file  Food Insecurity: Food Insecurity Present (09/09/2022)   Hunger Vital Sign    Worried About Running Out of Food in the Last Year: Sometimes true    Ran Out of Food in the Last Year: Never true  Transportation Needs: No Transportation Needs (09/06/2022)   PRAPARE - Hydrologist (Medical): No    Lack of Transportation (Non-Medical): No  Physical Activity: Not on file  Stress: Not on file  Social Connections: Not on file  Intimate Partner Violence: Not  At Risk (09/06/2022)   Humiliation, Afraid, Rape, and Kick questionnaire    Fear of Current or Ex-Partner: No    Emotionally Abused: No    Physically Abused: No    Sexually Abused: No   Current Outpatient Medications on File Prior to Visit  Medication Sig Dispense Refill   apixaban (ELIQUIS) 5 MG TABS tablet Take 1 tablet (5 mg total) by mouth 2 (two) times daily. 60 tablet 5   atorvastatin (LIPITOR) 80 MG tablet TAKE 1 TABLET BY MOUTH EVERY DAY 30 tablet 11   carvedilol (COREG) 25 MG tablet Take 1 tablet (25  mg total) by mouth 2 (two) times daily with a meal. 60 tablet 0   clopidogrel (PLAVIX) 75 MG tablet TAKE 1 TABLET(75 MG) BY MOUTH DAILY WITH BREAKFAST 30 tablet 11   glipiZIDE (GLUCOTROL) 5 MG tablet Take 1 tablet (5 mg total) by mouth daily before breakfast. 90 tablet 3   metFORMIN (GLUCOPHAGE-XR) 500 MG 24 hr tablet Take 4 tablets (2,000 mg total) by mouth daily. 360 tablet 3   Semaglutide,0.25 or 0.'5MG'$ /DOS, (OZEMPIC, 0.25 OR 0.5 MG/DOSE,) 2 MG/3ML SOPN Inject 0.5 mg into the skin once a week.     No current facility-administered medications on file prior to visit.   Allergies  Allergen Reactions   Bee Venom Anaphylaxis   Penicillins Anaphylaxis    Did it involve swelling of the face/tongue/throat, SOB, or low BP? yes Did it involve sudden or severe rash/hives, skin peeling, or any reaction on the inside of your mouth or nose? unknown Did you need to seek medical attention at a hospital or doctor's office? yes When did it last happen?      1967 If all above answers are "NO", may proceed with cephalosporin use.    Procaine Anaphylaxis   Isosorbide Other (See Comments)    Caused migraines   Tape Itching and Other (See Comments)    Certain medical tapes make the patient's skin ITCH   Family History  Problem Relation Age of Onset   Diabetes type II Other    Coronary artery disease Other    Bipolar disorder Other    Lung cancer Father    CAD Mother 49   PE: BP 130/88 (BP  Location: Right Arm, Patient Position: Sitting, Cuff Size: Normal)   Pulse 92   Ht '5\' 8"'$  (1.727 m)   Wt 256 lb (116.1 kg)   SpO2 96%   BMI 38.92 kg/m  Wt Readings from Last 3 Encounters:  11/04/22 256 lb (116.1 kg)  10/28/22 256 lb (116.1 kg)  10/03/22 255 lb (115.7 kg)   Constitutional: overweight, in NAD Eyes: no exophthalmos ENT: no thyromegaly, no cervical lymphadenopathy Cardiovascular: RRR, No MRG Respiratory: CTA B Musculoskeletal: no deformities Skin: moist, warm, + rash on L foot Neurological: no tremor with outstretched hands Diabetic Foot Exam - Simple   Simple Foot Form Diabetic Foot exam was performed with the following findings: Yes 11/04/2022  9:03 AM  Visual Inspection No deformities, no ulcerations, no other skin breakdown bilaterally: Yes See comments: Yes Sensation Testing Intact to touch and monofilament testing bilaterally: Yes Pulse Check Posterior Tibialis and Dorsalis pulse intact bilaterally: Yes Comments + onychodystrophy B halluceal toenails    ASSESSMENT: 1. DM2, noninsulin-dependent, but off insulin now, uncontrolled, with complications - CAD, s/p NSTEMI - cerebro-vascular disease, s/p TIA - PN  2. HL  PLAN:  1. Patient with longstanding, uncontrolled, type 2 diabetes, on oral antidiabetic regimen with metformin and sulfonylurea, to which we added weekly GLP-1 receptor agonist at last visit.  At that time, HbA1c was lower, at 9.0% with sugars at or slightly above target, but not checked consistently during the day.  At last visit we moved glipizide from bedtime to before breakfast.  He had a repeat HbA1c which was slightly lower, at 8.6% 2 weeks after the previous (?). - he lost 5 lbs since last OV on Ozempic.  He does have acid reflux related, but Gaviscon works well. - at today's visit, sugars appear to be improved from last visit, after starting Ozempic, despite being on the lowest dose of  Ozempic.  As of now, he cannot decrease the dose  due to GERD.  For now, I advised him to continue the current regimen.  I will clear him for surgery for now.  We also gave him an Ozempic sample since there is a problem with his patient assistance program application -we had to submit this today. - I suggested to:  Patient Instructions  Please continue: - Metformin ER 1000 mg 2x daily - Glipizide 5 mg before b'fast - Ozempic 0.25 mg weekly in a.m.  Please return in 3-4 months with your sugar log.   - we checked his HbA1c: 8.3% (better) - advised to check sugars at different times of the day - 1x a day, rotating check times - advised for yearly eye exams >> he is UTD - return to clinic in 3-4 months  2. HL -Reviewed latest lipid panel from 2 months ago: LDL at goal, triglycerides slightly high, HDL low: Lab Results  Component Value Date   CHOL 85 09/06/2022   HDL 23 (L) 09/06/2022   LDLCALC 28 09/06/2022   TRIG 169 (H) 09/06/2022   CHOLHDL 3.7 09/06/2022  -He continues on Lipitor 80 mg daily without side effects  Philemon Kingdom, MD PhD [facility] Endocrinology

## 2022-11-05 ENCOUNTER — Telehealth: Payer: Self-pay | Admitting: Cardiovascular Disease

## 2022-11-05 MED ORDER — CARVEDILOL 25 MG PO TABS: 25.0000 mg | ORAL_TABLET | Freq: Two times a day (BID) | ORAL | 1 refills | Status: DC

## 2022-11-05 NOTE — Telephone Encounter (Signed)
Rx(s) sent to pharmacy electronically.  

## 2022-11-05 NOTE — Telephone Encounter (Signed)
Pt c/o medication issue:  1. Name of Medication: carvedilol (COREG) 25 MG tablet   2. How are you currently taking this medication (dosage and times per day)?   Take 1 tablet (25 mg total) by mouth 2 (two) times daily with a meal.    3. Are you having a reaction (difficulty breathing--STAT)? No   4. What is your medication issue? Patient needs a new prescription sent in into  [facility] [place]

## 2022-11-11 NOTE — Telephone Encounter (Signed)
Patient brought by financial records for the patient assistance form-they are in Dr. Arman Filter bin in front office.

## 2022-11-12 NOTE — Telephone Encounter (Signed)
Financials received and application faxed to [employer].

## 2022-11-18 ENCOUNTER — Telehealth: Payer: Self-pay

## 2022-11-18 NOTE — Telephone Encounter (Signed)
Pt walked in office to check on his surgical clearance for his upcoming left shoulder surgery with Dr. Mardelle Matte ([place]). Pt wants to have this surgery 12/2022. Per pt, he dropped off his surgical clearance information 1 month ago. I called Dr. Luanna Cole office and left a detailed message for Magda Paganini [phone]) to get surgical clearance information faxed to our office. Per Dr. Loletha Grayer, he will complete the surgical clearance information when it is received via fax. Pt is aware and will be contacted when surgical clearance is sent to Dr. Mardelle Matte.

## 2022-11-19 NOTE — Telephone Encounter (Signed)
Pt returned call. Pt is aware that she doesn't need to complete labs.

## 2022-11-20 ENCOUNTER — Encounter: Payer: Self-pay | Admitting: Cardiovascular Disease

## 2022-11-20 NOTE — Telephone Encounter (Signed)
   Pre-operative Risk Assessment    Patient Name: Jerry Sh[facility]er  DOB: [birth year] MRN: [id number]      Request for Surgical Clearance    Procedure:   Lt Shoulder Scope rotator cuff repain   Date of Surgery:  Clearance TBD                                 Surgeon:  Dr. Marchia Bond Surgeon's Group or Practice Name:  [facility] Phone number:  [phone] Fax number:  [phone]   Type of Clearance Requested:   - Medical  - Pharmacy:  Hold Clopidogrel (Plavix) and Apixaban (Eliquis)     Type of Anesthesia:  General  with Interscalene block    Additional requests/questions:  Please advise surgeon/provider what medications should be held.  Signed, Belinda Block Korben Carcione   11/20/2022, 8:41 AM

## 2022-11-20 NOTE — Telephone Encounter (Signed)
Also sent letter via Epic

## 2022-11-20 NOTE — Telephone Encounter (Signed)
Attempted to call patient, left message for patient to call back to office.   

## 2022-11-20 NOTE — Telephone Encounter (Signed)
Attempted to call to follow up on Surgical Clearance- Dr. Luanna Cole office is closed until 8:30am- will attempt to call back once open.

## 2022-11-20 NOTE — Telephone Encounter (Signed)
Attempted to call patient, left message for patient to call back to office.    Per Dr. Sallyanne Kuster-   Dear Jerry Shaffer,   Jerry Shaffer is at low risk, from a cardiac standpoint, for the upcoming orthopedic procedure.  Clopidogrel can be held 5-7 days prior to the procedure; Eliquis can be held 2 days  prior to the procedure.   Sincerely,   Sanda Klein, MD, [facility]

## 2022-11-20 NOTE — Telephone Encounter (Signed)
Patient was returning call. Please advise ?

## 2022-11-21 NOTE — Telephone Encounter (Signed)
Spoke with pt personally, verbalized understanding. Sent again to Surgeon:  Dr. Marchia Bond; Surgeon's Group or Practice Name:  [facility]; Phone number:  [phone]; Fax number:  [phone]. Via [employer] function.

## 2022-11-21 NOTE — Telephone Encounter (Signed)
Pt walked in for cardiac clearance and which medication/how long to hold. Informed front desk representative:  Jerry Shaffer is at low risk, from a cardiac standpoint, for the upcoming orthopedic procedure.  Clopidogrel (Plavix) can be held 5-7 days prior to the procedure; Eliquis can be held 2 days  prior to the procedure. She will inform pt, she will inform pt.

## 2022-12-03 ENCOUNTER — Other Ambulatory Visit: Payer: Self-pay | Admitting: Student

## 2022-12-16 ENCOUNTER — Telehealth: Payer: Self-pay | Admitting: Internal Medicine

## 2022-12-16 DIAGNOSIS — E1165 Type 2 diabetes mellitus with hyperglycemia: Secondary | ICD-10-CM

## 2022-12-16 NOTE — Telephone Encounter (Signed)
Patient came by office saying that he still has not heard from Patient assistance and is close to running out of medication.  Ozempic (0.25 or 0.5 MG/DOSE) Semaglutide,0.25 or 0.'5MG'$ /DOS, (OZEMPIC, 0.25 OR 0.5 MG/DOSE,) 2 MG/3ML SOPN

## 2022-12-17 NOTE — Telephone Encounter (Signed)
Fax received from [employer] advising they were missing pt insurance information. Card printed and faxed.

## 2022-12-29 MED ORDER — OZEMPIC (0.25 OR 0.5 MG/DOSE) 2 MG/3ML ~~LOC~~ SOPN: 0.5000 mg | PEN_INJECTOR | SUBCUTANEOUS | 5 refills | Status: DC

## 2022-12-29 NOTE — Telephone Encounter (Signed)
Patient is calling to say that he is out of Ozempic and he has heard nothing to date from Patient Assistance.  Patient is using [facility] on [address] (may be [place] instead of [place]).

## 2022-12-29 NOTE — Addendum Note (Signed)
Addended by: Lauralyn Primes on: 12/29/2022 11:49 AM   Modules accepted: Orders

## 2022-12-29 NOTE — Telephone Encounter (Signed)
Rx sent to pharmacy.

## 2022-12-31 ENCOUNTER — Telehealth: Payer: Self-pay

## 2022-12-31 NOTE — Telephone Encounter (Signed)
Pt unable to afford rx of Ozempic. Sample labeled for pt to pick up until we hear from patient assistance.

## 2023-01-01 NOTE — Telephone Encounter (Signed)
Patient picked up sample of Ozempic in office today.

## 2023-01-01 NOTE — Progress Notes (Signed)
Surgery orders requested via Epic inbox. °

## 2023-01-06 ENCOUNTER — Other Ambulatory Visit: Payer: Self-pay | Admitting: Student

## 2023-01-07 NOTE — Patient Instructions (Signed)
SURGICAL WAITING ROOM VISITATION  Patients having surgery or a procedure may have no more than 2 support people in the waiting area - these visitors may rotate.    Children under the age of 35 must have an adult with them who is not the patient.  Due to an increase in RSV and influenza rates and associated hospitalizations, children ages 9 and under may not visit patients in [place].  If the patient needs to stay at the hospital during part of their recovery, the visitor guidelines for inpatient rooms apply. Pre-op nurse will coordinate an appropriate time for 1 support person to accompany patient in pre-op.  This support person may not rotate.    Please refer to the [facility] website for the visitor guidelines for Inpatients (after your surgery is over and you are in a regular room).     Your procedure is scheduled on: 01/20/23   Report to [facility] Main Entrance    Report to admitting at 8:15 AM   Call this number if you have problems the morning of surgery [phone]   Do not eat food :After Midnight.   After Midnight you may have the following liquids until 7:30 AM DAY OF SURGERY  Water Non-Citrus Juices (without pulp, NO RED-Apple, White grape, White cranberry) Black Coffee (NO MILK/CREAM OR CREAMERS, sugar ok)  Clear Tea (NO MILK/CREAM OR CREAMERS, sugar ok) regular and decaf                             Plain Jell-O (NO RED)                                           Fruit ices (not with fruit pulp, NO RED)                                     Popsicles (NO RED)                                                               Sports drinks like Gatorade (NO RED)                The day of surgery:  Drink ONE (1) Pre-Surgery Clear Ensure or G2 at 7:30 AM the morning of surgery. Drink in one sitting. Do not sip.  This drink was given to you during your hospital  pre-op appointment visit. Nothing else to drink after completing the  Pre-Surgery Clear  Ensure or G2.          If you have questions, please contact your surgeon's office.   FOLLOW BOWEL PREP AND ANY ADDITIONAL PRE OP INSTRUCTIONS YOU RECEIVED FROM YOUR SURGEON'S OFFICE!!!     Oral Hygiene is also important to reduce your risk of infection.                                    Remember - BRUSH YOUR TEETH THE MORNING OF SURGERY WITH YOUR REGULAR  TOOTHPASTE  DENTURES WILL BE REMOVED PRIOR TO SURGERY PLEASE DO NOT APPLY "Poly grip" OR ADHESIVES!!!   Do NOT smoke after Midnight   Take these medicines the morning of surgery with A SIP OF WATER: Inhalers, Atorvastatin, Carvedilol   These are anesthesia recommendations for holding your anticoagulants.  Please contact your prescribing physician to confirm IF it is safe to hold your anticoagulants for this length of time.   Eliquis Apixaban   72 hours   Xarelto Rivaroxaban   72 hours  Plavix Clopidogrel   120 hours  Pletal Cilostazol   120 hours    DO NOT TAKE ANY ORAL DIABETIC MEDICATIONS DAY OF YOUR SURGERY  How to Manage Your Diabetes Before and After Surgery  Why is it important to control my blood sugar before and after surgery? Improving blood sugar levels before and after surgery helps healing and can limit problems. A way of improving blood sugar control is eating a healthy diet by:  Eating less sugar and carbohydrates  Increasing activity/exercise  Talking with your doctor about reaching your blood sugar goals High blood sugars (greater than 180 mg/dL) can raise your risk of infections and slow your recovery, so you will need to focus on controlling your diabetes during the weeks before surgery. Make sure that the doctor who takes care of your diabetes knows about your planned surgery including the date and location.  How do I manage my blood sugar before surgery? Check your blood sugar at least 4 times a day, starting 2 days before surgery, to make sure that the level is not too high or low. Check your blood  sugar the morning of your surgery when you wake up and every 2 hours until you get to the Short Stay unit. If your blood sugar is less than 70 mg/dL, you will need to treat for low blood sugar: Do not take insulin. Treat a low blood sugar (less than 70 mg/dL) with  cup of clear juice (cranberry or apple), 4 glucose tablets, OR glucose gel. Recheck blood sugar in 15 minutes after treatment (to make sure it is greater than 70 mg/dL). If your blood sugar is not greater than 70 mg/dL on recheck, call [phone] for further instructions. Report your blood sugar to the short stay nurse when you get to Short Stay.  If you are admitted to the hospital after surgery: Your blood sugar will be checked by the staff and you will probably be given insulin after surgery (instead of oral diabetes medicines) to make sure you have good blood sugar levels. The goal for blood sugar control after surgery is 80-180 mg/dL.   WHAT DO I DO ABOUT MY DIABETES MEDICATION?  Do not take oral diabetes medicines (pills) the morning of surgery.  Hold Ozempic for 7 days before surgery. Last dose 01/08/23. Do not take 01/15/23.  THE DAY BEFORE SURGERY, take Metformin as prescribed.      THE MORNING OF SURGERY, do not take Metformin   DO NOT TAKE THE FOLLOWING 7 DAYS PRIOR TO SURGERY: Ozempic, Wegovy, Rybelsus (Semaglutide), Byetta (exenatide), Bydureon (exenatide ER), Victoza, Saxenda (liraglutide), or Trulicity (dulaglutide) Mounjaro (Tirzepatide) Adlyxin (Lixisenatide), Polyethylene Glycol Loxenatide.  Reviewed and Endorsed by [facility] Patient Education Committee, August 2015  Bring CPAP mask and tubing day of surgery.                              You may not have any metal  on your body including hair pins, jewelry, and body piercing             Do not wear make-up, lotions, powders, perfumes, or deodorant  Do not wear nail polish including gel and S&S, artificial/acrylic nails, or any other type of covering on  natural nails including finger and toenails. If you have artificial nails, gel coating, etc. that needs to be removed by a nail salon please have this removed prior to surgery or surgery may need to be canceled/ delayed if the surgeon/ anesthesia feels like they are unable to be safely monitored.   Do not shave  48 hours prior to surgery.    Do not bring valuables to the hospital. [place].   Contacts, glasses, dentures or bridgework may not be worn into surgery.   Bring small overnight bag day of surgery.   DO NOT [place]. PHARMACY WILL DISPENSE MEDICATIONS LISTED ON YOUR MEDICATION LIST TO YOU DURING YOUR ADMISSION [place]!              Please read over the following fact sheets you were given: IF [place] [phone] Schneiders    If you received a COVID test during your pre-op visit  it is requested that you wear a mask when out in public, stay away from anyone that may not be feeling well and notify your surgeon if you develop symptoms. If you test positive for Covid or have been in contact with anyone that has tested positive in the last 10 days please notify you surgeon.    [place] - Preparing for Surgery Before surgery, you can play an important role.  Because skin is not sterile, your skin needs to be as free of germs as possible.  You can reduce the number of germs on your skin by washing with CHG (chlorahexidine gluconate) soap before surgery.  CHG is an antiseptic cleaner which kills germs and bonds with the skin to continue killing germs even after washing. Please DO NOT use if you have an allergy to CHG or antibacterial soaps.  If your skin becomes reddened/irritated stop using the CHG and inform your nurse when you arrive at Short Stay. Do not shave (including legs and underarms) for at least 48 hours prior to the first CHG shower.   You may shave your face/neck.  Please follow these instructions carefully:  1.  Shower with CHG Soap the night before surgery and the  morning of surgery.  2.  If you choose to wash your hair, wash your hair first as usual with your normal  shampoo.  3.  After you shampoo, rinse your hair and body thoroughly to remove the shampoo.                             4.  Use CHG as you would any other liquid soap.  You can apply chg directly to the skin and wash.  Gently with a scrungie or clean washcloth.  5.  Apply the CHG Soap to your body ONLY FROM THE NECK DOWN.   Do   not use on face/ open  Wound or open sores. Avoid contact with eyes, ears mouth and   genitals (private parts).                       Wash face,  Genitals (private parts) with your normal soap.             6.  Wash thoroughly, paying special attention to the area where your    surgery  will be performed.  7.  Thoroughly rinse your body with warm water from the neck down.  8.  DO NOT shower/wash with your normal soap after using and rinsing off the CHG Soap.                9.  Pat yourself dry with a clean towel.            10.  Wear clean pajamas.            11.  Place clean sheets on your bed the night of your first shower and do not  sleep with pets. Day of Surgery : Do not apply any lotions/deodorants the morning of surgery.  Please wear clean clothes to the hospital/surgery center.  FAILURE TO FOLLOW THESE INSTRUCTIONS MAY RESULT IN THE CANCELLATION OF YOUR SURGERY  PATIENT SIGNATURE_________________________________  NURSE SIGNATURE__________________________________  ________________________________________________________________________  Adam Phenix  An incentive spirometer is a tool that can help keep your lungs clear and active. This tool measures how well you are filling your lungs with each breath. Taking long deep breaths may help reverse or decrease the chance of developing  breathing (pulmonary) problems (especially infection) following: A long period of time when you are unable to move or be active. BEFORE THE PROCEDURE  If the spirometer includes an indicator to show your best effort, your nurse or respiratory therapist will set it to a desired goal. If possible, sit up straight or lean slightly forward. Try not to slouch. Hold the incentive spirometer in an upright position. INSTRUCTIONS FOR USE  Sit on the edge of your bed if possible, or sit up as far as you can in bed or on a chair. Hold the incentive spirometer in an upright position. Breathe out normally. Place the mouthpiece in your mouth and seal your lips tightly around it. Breathe in slowly and as deeply as possible, raising the piston or the ball toward the top of the column. Hold your breath for 3-5 seconds or for as long as possible. Allow the piston or ball to fall to the bottom of the column. Remove the mouthpiece from your mouth and breathe out normally. Rest for a few seconds and repeat Steps 1 through 7 at least 10 times every 1-2 hours when you are awake. Take your time and take a few normal breaths between deep breaths. The spirometer may include an indicator to show your best effort. Use the indicator as a goal to work toward during each repetition. After each set of 10 deep breaths, practice coughing to be sure your lungs are clear. If you have an incision (the cut made at the time of surgery), support your incision when coughing by placing a pillow or rolled up towels firmly against it. Once you are able to get out of bed, walk around indoors and cough well. You may stop using the incentive spirometer when instructed by your caregiver.  RISKS AND COMPLICATIONS Take your time so you do not get dizzy or light-headed. If you are in pain, you may need  to take or ask for pain medication before doing incentive spirometry. It is harder to take a deep breath if you are having pain. AFTER USE Rest  and breathe slowly and easily. It can be helpful to keep track of a log of your progress. Your caregiver can provide you with a simple table to help with this. If you are using the spirometer at home, follow these instructions: [place] IF:  You are having difficultly using the spirometer. You have trouble using the spirometer as often as instructed. Your pain medication is not giving enough relief while using the spirometer. You develop fever of 100.5 F (38.1 C) or higher. SEEK IMMEDIATE MEDICAL CARE IF:  You cough up bloody sputum that had not been present before. You develop fever of 102 F (38.9 C) or greater. You develop worsening pain at or near the incision site. MAKE SURE YOU:  Understand these instructions. Will watch your condition. Will get help right away if you are not doing well or get worse. Document Released: 04/13/2007 Document Revised: 02/23/2012 Document Reviewed: 06/14/2007 ExitCare Patient Information 2014 [place].   ________________________________________________________________________ [facility]- Preparing for Total Shoulder Arthroplasty    Before surgery, you can play an important role. Because skin is not sterile, your skin needs to be as free of germs as possible. You can reduce the number of germs on your skin by using the following products. Benzoyl Peroxide Gel Reduces the number of germs present on the skin Applied twice a day to shoulder area starting two days before surgery    ==================================================================  Please follow these instructions carefully:  BENZOYL PEROXIDE 5% GEL  Please do not use if you have an allergy to benzoyl peroxide.   If your skin becomes reddened/irritated stop using the benzoyl peroxide.  Starting two days before surgery, apply as follows: Apply benzoyl peroxide in the morning and at night. Apply after taking a shower. If you are not taking a shower clean entire  shoulder front, back, and side along with the armpit with a clean wet washcloth.  Place a quarter-sized dollop on your shoulder and rub in thoroughly, making sure to cover the front, back, and side of your shoulder, along with the armpit.   2 days before ____ AM   ____ PM              1 day before ____ AM   ____ PM                         Do this twice a day for two days.  (Last application is the night before surgery, AFTER using the CHG soap as described below).  Do NOT apply benzoyl peroxide gel on the day of surgery.

## 2023-01-07 NOTE — Progress Notes (Signed)
COVID Vaccine Completed:  Date of COVID positive in last 90 days:  PCP -  Cardiologist - Sanda Klein, MD Electrophysiologist- Doralee Albino, MD  Chest x-ray - 09/14/22 Epic EKG - 10/28/22 Epic Stress Test - 10/03/22 Epic ECHO -09/06/22 Epic  Cardiac Cath - 03/28/21 Epic Pacemaker/ICD device last checked: Spinal Cord Stimulator:  Bowel Prep -   Sleep Study -  CPAP -   Fasting Blood Sugar -  Checks Blood Sugar _____ times a day  Last dose of GLP1 agonist-  Ozempic GLP1 instructions:    Last dose of SGLT-2 inhibitors-  N/A SGLT-2 instructions: N/A   Blood Thinner Instructions: Eliquis hold 2 days, Plavix hold 5-7 days Aspirin Instructions: Last Dose:  Activity level:  Can go up a flight of stairs and perform activities of daily living without stopping and without symptoms of chest pain or shortness of breath.  Able to exercise without symptoms  Unable to go up a flight of stairs without symptoms of     Anesthesia review: HTN, NSTEMI, CAD, a flutter, stroke, CP 09/14/22  Patient denies shortness of breath, fever, cough and chest pain at PAT appointment  Patient verbalized understanding of instructions that were given to them at the PAT appointment. Patient was also instructed that they will need to review over the PAT instructions again at home before surgery.

## 2023-01-08 ENCOUNTER — Encounter (HOSPITAL_COMMUNITY)
Admission: RE | Admit: 2023-01-08 | Discharge: 2023-01-08 | Disposition: A | Discharge status: Home / Self Care | Payer: 59 | Source: Ambulatory Visit | Attending: Orthopedic Surgery | Admitting: Orthopedic Surgery

## 2023-01-08 ENCOUNTER — Encounter (HOSPITAL_COMMUNITY): Payer: Self-pay

## 2023-01-08 VITALS — BP 137/87 | Temp 98.7°F | Resp 14 | Ht 68.0 in | Wt 248.8 lb

## 2023-01-08 DIAGNOSIS — E1159 Type 2 diabetes mellitus with other circulatory complications: Secondary | ICD-10-CM | POA: Insufficient documentation

## 2023-01-08 DIAGNOSIS — Z01812 Encounter for preprocedural laboratory examination: Secondary | ICD-10-CM | POA: Insufficient documentation

## 2023-01-08 DIAGNOSIS — Z794 Long term (current) use of insulin: Secondary | ICD-10-CM

## 2023-01-08 HISTORY — DX: Personal history of urinary calculi: Z87.442

## 2023-01-08 HISTORY — DX: Pneumonia, unspecified organism: J18.9

## 2023-01-08 HISTORY — DX: Gastro-esophageal reflux disease without esophagitis: K21.9

## 2023-01-08 LAB — CBC
HCT: 39.6 % (ref 39.0–52.0)
Hemoglobin: 12.5 g/dL — ABNORMAL LOW (ref 13.0–17.0)
MCH: 27.7 pg (ref 26.0–34.0)
MCHC: 31.6 g/dL (ref 30.0–36.0)
MCV: 87.8 fL (ref 80.0–100.0)
Platelets: 226 10*3/uL (ref 150–400)
RBC: 4.51 MIL/uL (ref 4.22–5.81)
RDW: 13.8 % (ref 11.5–15.5)
WBC: 9.4 10*3/uL (ref 4.0–10.5)
nRBC: 0 % (ref 0.0–0.2)

## 2023-01-08 LAB — BASIC METABOLIC PANEL
Anion gap: 10 (ref 5–15)
BUN: 22 mg/dL — ABNORMAL HIGH (ref 6–20)
CO2: 22 mmol/L (ref 22–32)
Calcium: 8.4 mg/dL — ABNORMAL LOW (ref 8.9–10.3)
Chloride: 103 mmol/L (ref 98–111)
Creatinine, Ser: 0.9 mg/dL (ref 0.61–1.24)
GFR, Estimated: >60 mL/min (ref >60)
Glucose, Bld: 242 mg/dL — ABNORMAL HIGH (ref 70–99)
Potassium: 4.1 mmol/L (ref 3.5–5.1)
Sodium: 135 mmol/L (ref 135–145)

## 2023-01-08 LAB — HEMOGLOBIN A1C
Hgb A1c MFr Bld: 7.5 % — ABNORMAL HIGH (ref 4.8–5.6)
Mean Plasma Glucose: 168.55 mg/dL

## 2023-01-08 LAB — GLUCOSE, CAPILLARY: Glucose-Capillary: 241 mg/dL — ABNORMAL HIGH (ref 70–99)

## 2023-01-13 NOTE — Progress Notes (Signed)
Anesthesia Chart Review   Case: [id number] Date/Time: 01/20/23 1015   Procedures:      SHOULDER ARTHROSCOPY WITH ROTATOR CUFF REPAIR AND SUBACROMIAL DECOMPRESSION (Left)     SHOULDER ARTHROSCOPY WITH DISTAL CLAVICLE EXCISION (Left: Shoulder)   Anesthesia type: General   Pre-op diagnosis: Left shoulder cartilage disorder, OA, impingement, rotator cuff tear,   Location: [facility] ROOM 07 / [facility]   Surgeons: Marchia Bond, MD       DISCUSSION:62 y.o. former smoker with h/o PONV, DM II, bipolar disorder, HTN, CAD s/p PCI in 2021 and 2022, atrial flutter, stroke, left shoulder OA scheduled for above procedure 01/20/23 with Dr. Marchia Bond.   S/p ablation for a-flutter 09/08/2022.   Per cardiology note 11/20/2022, "Jerry Shaffer is at low risk, from a cardiac standpoint, for the upcoming orthopedic procedure.  Clopidogrel can be held 5-7 days prior to the procedure; Eliquis can be held 2 days  prior to the procedure. "  Anticipate pt can proceed with planned procedure barring acute status change.   VS: BP 137/87   Temp 37.1 C (Oral)   Resp 14   Ht '5\' 8"'$  (1.727 m)   Wt 112.9 kg   SpO2 97%   BMI 37.83 kg/m   PROVIDERS: Patient, No Pcp Per  Cardiologist - Sanda Klein, MD   Electrophysiologist- Doralee Albino, MD  LABS: Labs reviewed: Acceptable for surgery. (all labs ordered are listed, but only abnormal results are displayed)  Labs Reviewed  BASIC METABOLIC PANEL - Abnormal; Notable for the following components:      Result Value   Glucose, Bld 242 (*)    BUN 22 (*)    Calcium 8.4 (*)    All other components within normal limits  HEMOGLOBIN A1C - Abnormal; Notable for the following components:   Hgb A1c MFr Bld 7.5 (*)    All other components within normal limits  CBC - Abnormal; Notable for the following components:   Hemoglobin 12.5 (*)    All other components within normal limits  GLUCOSE, CAPILLARY - Abnormal; Notable for the following components:   Glucose-Capillary  241 (*)    All other components within normal limits     IMAGES:   EKG:   CV: Myocardial Perfusion 10/03/2022   Findings are consistent with prior myocardial infarction. The study is overall low risk given no significant change in LV function or perfusion defects since last exam.   No ST deviation was noted.   LV perfusion is abnormal. There is no evidence of ischemia. There is evidence of infarction. Defect 1: There is a medium defect with moderate reduction in uptake present in the apical to basal inferior and inferolateral location(s) that is fixed. There is abnormal wall motion in the defect area. Consistent with infarction.   Left ventricular function is grossly normal. Nuclear stress EF: 53 %. Visually appears 54-27% End diastolic cavity size is moderately enlarged. End systolic cavity size is mildly enlarged. Evidence of transient ischemic dilation (TID) noted, 1.22. TID was appreciated quantitatively but not visually.   Prior study available for comparison from 03/08/2021. No changes compared to prior study.  Echo 09/06/22 1. Left ventricular ejection fraction, by estimation, is 60 to 65%. The  left ventricle has normal function. The left ventricle has no regional  wall motion abnormalities. There is mild left ventricular hypertrophy.  Left ventricular diastolic parameters  are indeterminate.   2. Right ventricular systolic function is normal. The right ventricular  size is normal.   3.  Left atrial size was mildly dilated.   4. The mitral valve is normal in structure. No evidence of mitral valve  regurgitation. No evidence of mitral stenosis.   5. The aortic valve was not well visualized. Aortic valve regurgitation  is not visualized. No aortic stenosis is present.   6. The inferior vena cava is normal in size with greater than 50%  respiratory variability, suggesting right atrial pressure of 3 mmHg.  Past Medical History:  Diagnosis Date   Atrial flutter ([place])    Bipolar 1  disorder ([place])    Coronary artery disease    Diabetes mellitus    Dyslipidemia    Emphysema    GERD (gastroesophageal reflux disease)    History of kidney stones    Hypertension    Mental disorder    BIPOLAR DISORDER   Migraine    NSTEMI (non-ST elevated myocardial infarction) ([place]) 09/20/2020   Pneumonia    PONV (postoperative nausea and vomiting)    Renal disorder    Shortness of breath    Stroke ([place])    Transient ischemic attack (TIA)     Past Surgical History:  Procedure Laterality Date   A-FLUTTER ABLATION N/A 09/08/2022   Procedure: A-FLUTTER ABLATION;  Surgeon: Mealor, Yetta Barre, MD;  Location: [place] CV LAB;  Service: Cardiovascular;  Laterality: N/A;   COLONOSCOPY WITH PROPOFOL N/A 10/17/2021   Procedure: COLONOSCOPY WITH PROPOFOL;  Surgeon: Milus Banister, MD;  [facility];  Service: Endoscopy;  Laterality: N/A;   CORONARY ANGIOGRAPHY  09/20/2020   CORONARY STENT INTERVENTION  09/20/2020   CORONARY STENT INTERVENTION   CORONARY STENT INTERVENTION N/A 09/20/2020   Procedure: CORONARY STENT INTERVENTION;  Surgeon: Belva Crome, MD;  Location: [place] CV LAB;  Service: Cardiovascular;  Laterality: N/A;   CORONARY STENT INTERVENTION N/A 03/28/2021   Procedure: CORONARY STENT INTERVENTION;  Surgeon: [name], MD;  Location: [place] CV LAB;  Service: Cardiovascular;  Laterality: N/A;   HEMORROIDECTOMY     KNEE ARTHROSCOPY     LEFT HEART CATH AND CORONARY ANGIOGRAPHY N/A 09/20/2020   Procedure: LEFT HEART CATH AND CORONARY ANGIOGRAPHY;  Surgeon: Belva Crome, MD;  Location: [place] CV LAB;  Service: Cardiovascular;  Laterality: N/A;   LEFT HEART CATH AND CORONARY ANGIOGRAPHY N/A 03/28/2021   Procedure: LEFT HEART CATH AND CORONARY ANGIOGRAPHY;  Surgeon: [name], MD;  Location: [place] CV LAB;  Service: Cardiovascular;  Laterality: N/A;   LUMBAR DISC SURGERY     POLYPECTOMY  10/17/2021   Procedure: POLYPECTOMY;  Surgeon:  Milus Banister, MD;  [facility];  Service: Endoscopy;;   TONSILLECTOMY AND ADENOIDECTOMY      MEDICATIONS:  albuterol (VENTOLIN HFA) 108 (90 Base) MCG/ACT inhaler   apixaban (ELIQUIS) 5 MG TABS tablet   atorvastatin (LIPITOR) 80 MG tablet   carvedilol (COREG) 25 MG tablet   clopidogrel (PLAVIX) 75 MG tablet   glipiZIDE (GLUCOTROL) 5 MG tablet   metFORMIN (GLUCOPHAGE-XR) 500 MG 24 hr tablet   Semaglutide,0.25 or 0.'5MG'$ /DOS, (OZEMPIC, 0.25 OR 0.5 MG/DOSE,) 2 MG/3ML SOPN   No current facility-administered medications for this encounter.     Konrad Felix Ward, PA-C [facility]-Surgical Testing [phone]

## 2023-01-19 NOTE — H&P (Signed)
PREOPERATIVE H&P  Chief Complaint: left shoulder pain  HPI: Jerry Shaffer is a 62 y.o. male who presents for preoperative history and physical with a diagnosis of left shoulder rotator cuff tear. He hit a concrete pole on January 27, 2022 and has had persistent left shoulder pain ever since.  Of note, he has been told he has right shoulder rotator cuff arthropathy and had a discussion with another surgeon about reverse shoulder replacement versus pectoralis grafting, although ultimately is managing that non-surgically.  The left shoulder, however, has had persistent dysfunction.  He drives for a security company.  The Meloxicam has been bothering his stomach.  We have done cortisone shots, as well as Lidocaine shots and none of these have provided long-term relief.  Pain is diffuse around the left shoulder.  This is significantly impairing activities of daily living.  He has elected for surgical management.   Past Medical History:  Diagnosis Date   Atrial flutter ([place])    Bipolar 1 disorder ([place])    Coronary artery disease    Diabetes mellitus    Dyslipidemia    Emphysema    GERD (gastroesophageal reflux disease)    History of kidney stones    Hypertension    Mental disorder    BIPOLAR DISORDER   Migraine    NSTEMI (non-ST elevated myocardial infarction) ([place]) 09/20/2020   Pneumonia    PONV (postoperative nausea and vomiting)    Renal disorder    Shortness of breath    Stroke ([place])    Transient ischemic attack (TIA)    Past Surgical History:  Procedure Laterality Date   A-FLUTTER ABLATION N/A 09/08/2022   Procedure: A-FLUTTER ABLATION;  Surgeon: Mealor, Yetta Barre, MD;  Location: [facility];  Service: Cardiovascular;  Laterality: N/A;   COLONOSCOPY WITH PROPOFOL N/A 10/17/2021   Procedure: COLONOSCOPY WITH PROPOFOL;  Surgeon: Milus Banister, MD;  Location: [facility];  Service: Endoscopy;  Laterality: N/A;   CORONARY ANGIOGRAPHY  09/20/2020   CORONARY STENT  INTERVENTION  09/20/2020   CORONARY STENT INTERVENTION   CORONARY STENT INTERVENTION N/A 09/20/2020   Procedure: CORONARY STENT INTERVENTION;  Surgeon: Belva Crome, MD;  Location: [facility];  Service: Cardiovascular;  Laterality: N/A;   CORONARY STENT INTERVENTION N/A 03/28/2021   Procedure: CORONARY STENT INTERVENTION;  Surgeon: [name], MD;  Location: [facility];  Service: Cardiovascular;  Laterality: N/A;   HEMORROIDECTOMY     KNEE ARTHROSCOPY     LEFT HEART CATH AND CORONARY ANGIOGRAPHY N/A 09/20/2020   Procedure: LEFT HEART CATH AND CORONARY ANGIOGRAPHY;  Surgeon: Belva Crome, MD;  Location: [facility];  Service: Cardiovascular;  Laterality: N/A;   LEFT HEART CATH AND CORONARY ANGIOGRAPHY N/A 03/28/2021   Procedure: LEFT HEART CATH AND CORONARY ANGIOGRAPHY;  Surgeon: [name], MD;  Location: [facility];  Service: Cardiovascular;  Laterality: N/A;   LUMBAR DISC SURGERY     POLYPECTOMY  10/17/2021   Procedure: POLYPECTOMY;  Surgeon: Milus Banister, MD;  Location: [facility];  Service: Endoscopy;;   TONSILLECTOMY AND ADENOIDECTOMY     Social History   Socioeconomic History   Marital status: Single    Spouse name: Not on file   Number of children: Not on file   Years of education: Not on file   Highest education level: Not on file  Occupational History   Occupation: Security guard    Employer: [employer]  Tobacco Use  Smoking status: Former    Types: E-cigarettes   Smokeless tobacco: Current  Vaping Use   Vaping Use: Former  Substance and Sexual Activity   Alcohol use: No   Drug use: Not Currently   Sexual activity: Not Currently  Other Topics Concern   Not on file  Social History Narrative   Not on file   Social Determinants of Health   Financial Resource Strain: Not on file  Food Insecurity: Food Insecurity Present (09/09/2022)   Hunger Vital Sign    Worried About Running Out of Food in the Last Year:  Sometimes true    Ran Out of Food in the Last Year: Never true  Transportation Needs: No Transportation Needs (09/06/2022)   PRAPARE - Hydrologist (Medical): No    Lack of Transportation (Non-Medical): No  Physical Activity: Not on file  Stress: Not on file  Social Connections: Not on file   Family History  Problem Relation Age of Onset   Diabetes type II Other    Coronary artery disease Other    Bipolar disorder Other    Lung cancer Father    CAD Mother 26   Allergies  Allergen Reactions   Bee Venom Anaphylaxis   Penicillins Anaphylaxis    Did it involve swelling of the face/tongue/throat, SOB, or low BP? yes Did it involve sudden or severe rash/hives, skin peeling, or any reaction on the inside of your mouth or nose? unknown Did you need to seek medical attention at a hospital or doctor's office? yes When did it last happen?      1967 If all above answers are "NO", may proceed with cephalosporin use.    Procaine Anaphylaxis   Isosorbide Other (See Comments)    Caused migraines   Tape Itching and Other (See Comments)    Certain medical tapes make the patient's skin ITCH   Prior to Admission medications   Medication Sig Start Date End Date Taking? Authorizing Provider  albuterol (VENTOLIN HFA) 108 (90 Base) MCG/ACT inhaler Inhale 1-2 puffs into the lungs every 6 (six) hours as needed for wheezing or shortness of breath.   Yes [provider]  apixaban (ELIQUIS) 5 MG TABS tablet Take 1 tablet (5 mg total) by mouth 2 (two) times daily. 09/09/22  Yes Baldwin Jamaica, PA-C  atorvastatin (LIPITOR) 80 MG tablet TAKE 1 TABLET BY MOUTH EVERY DAY 07/07/22  Yes Lendon Colonel, NP  carvedilol (COREG) 25 MG tablet Take 1 tablet (25 mg total) by mouth 2 (two) times daily with a meal. 11/05/22 05/04/23 Yes Croitoru, Mihai, MD  clopidogrel (PLAVIX) 75 MG tablet TAKE 1 TABLET(75 MG) BY MOUTH DAILY WITH BREAKFAST 08/13/22  Yes Croitoru, Mihai, MD   glipiZIDE (GLUCOTROL) 5 MG tablet Take 1 tablet (5 mg total) by mouth daily before breakfast. 10/06/22  Yes Philemon Kingdom, MD  metFORMIN (GLUCOPHAGE-XR) 500 MG 24 hr tablet Take 4 tablets (2,000 mg total) by mouth daily. Patient taking differently: Take 1,000 mg by mouth 2 (two) times daily with a meal. 08/26/22  Yes Philemon Kingdom, MD  Semaglutide,0.25 or 0.'5MG'$ /DOS, (OZEMPIC, 0.25 OR 0.5 MG/DOSE,) 2 MG/3ML SOPN Inject 0.5 mg into the skin once a week. 12/29/22  Yes Philemon Kingdom, MD     Positive ROS: All other systems have been reviewed and were otherwise negative with the exception of those mentioned in the HPI and as above.  Physical Exam: General: Alert, no acute distress Cardiovascular: No pedal edema Respiratory: No cyanosis,  no use of accessory musculature GI: No organomegaly, abdomen is soft and non-tender Skin: No lesions in the area of chief complaint Neurologic: Sensation intact distally Psychiatric: Patient is competent for consent with normal mood and affect Lymphatic: No axillary or cervical lymphadenopathy  MUSCULOSKELETAL: On exam the left shoulder active motion is 0-90 degrees.  Cuff strength is weak with both supraspinatus and subscapularis testing.  Equivocal pain over the [facility] joint.  His exam is a little bit variable, but it does seem like he has some pain over the [facility] joint.    Imaging: MRI demonstrates evidence for a large rotator cuff tear, full thickness, involving 2.2 centimeter AP dimension of the supraspinatus.  Subscapularis is also torn.  There is not too much in the way of atrophy.  He has some moderate AC joint changes.      Assessment: Left shoulder supraspinatus and subscapularis tear with some impingement and AC joint arthrosis.     Plan: Plan for Procedure(s): SHOULDER ARTHROSCOPY WITH ROTATOR CUFF REPAIR AND SUBACROMIAL DECOMPRESSION SHOULDER ARTHROSCOPY WITH DISTAL CLAVICLE EXCISION  The risks benefits and alternatives were discussed  with the patient including but not limited to the risks of nonoperative treatment, versus surgical intervention including infection, bleeding, nerve injury,  blood clots, cardiopulmonary complications, morbidity, mortality, among others, and they were willing to proceed.     Ventura Bruns, PA-C    01/19/2023 2:16 PM

## 2023-01-20 ENCOUNTER — Encounter (HOSPITAL_COMMUNITY)
Admission: RE | Disposition: A | Discharge status: Home / Self Care | Payer: Self-pay | Source: Ambulatory Visit | Attending: Orthopedic Surgery

## 2023-01-20 ENCOUNTER — Other Ambulatory Visit: Payer: Self-pay

## 2023-01-20 ENCOUNTER — Encounter (HOSPITAL_COMMUNITY): Payer: Self-pay | Admitting: Orthopedic Surgery

## 2023-01-20 ENCOUNTER — Ambulatory Visit (HOSPITAL_BASED_OUTPATIENT_CLINIC_OR_DEPARTMENT_OTHER): Payer: Worker's Compensation | Admitting: Anesthesiology

## 2023-01-20 ENCOUNTER — Ambulatory Visit (HOSPITAL_COMMUNITY): Payer: Worker's Compensation | Admitting: Physician Assistant

## 2023-01-20 ENCOUNTER — Observation Stay (HOSPITAL_COMMUNITY)
Admission: RE | Admit: 2023-01-20 | Discharge: 2023-01-21 | Disposition: A | Discharge status: Home / Self Care | Payer: Worker's Compensation | Source: Ambulatory Visit | Attending: Orthopedic Surgery | Admitting: Orthopedic Surgery

## 2023-01-20 DIAGNOSIS — I2510 Atherosclerotic heart disease of native coronary artery without angina pectoris: Secondary | ICD-10-CM | POA: Insufficient documentation

## 2023-01-20 DIAGNOSIS — M19012 Primary osteoarthritis, left shoulder: Secondary | ICD-10-CM | POA: Insufficient documentation

## 2023-01-20 DIAGNOSIS — M7522 Bicipital tendinitis, left shoulder: Secondary | ICD-10-CM

## 2023-01-20 DIAGNOSIS — Z955 Presence of coronary angioplasty implant and graft: Secondary | ICD-10-CM | POA: Diagnosis not present

## 2023-01-20 DIAGNOSIS — I10 Essential (primary) hypertension: Secondary | ICD-10-CM | POA: Insufficient documentation

## 2023-01-20 DIAGNOSIS — Z7902 Long term (current) use of antithrombotics/antiplatelets: Secondary | ICD-10-CM | POA: Insufficient documentation

## 2023-01-20 DIAGNOSIS — Z7901 Long term (current) use of anticoagulants: Secondary | ICD-10-CM | POA: Insufficient documentation

## 2023-01-20 DIAGNOSIS — S43432A Superior glenoid labrum lesion of left shoulder, initial encounter: Secondary | ICD-10-CM | POA: Diagnosis not present

## 2023-01-20 DIAGNOSIS — X58XXXA Exposure to other specified factors, initial encounter: Secondary | ICD-10-CM | POA: Diagnosis not present

## 2023-01-20 DIAGNOSIS — Z794 Long term (current) use of insulin: Secondary | ICD-10-CM

## 2023-01-20 DIAGNOSIS — I252 Old myocardial infarction: Secondary | ICD-10-CM

## 2023-01-20 DIAGNOSIS — M7542 Impingement syndrome of left shoulder: Secondary | ICD-10-CM

## 2023-01-20 DIAGNOSIS — Z812 Family history of tobacco abuse and dependence: Secondary | ICD-10-CM | POA: Insufficient documentation

## 2023-01-20 DIAGNOSIS — S46212A Strain of muscle, fascia and tendon of other parts of biceps, left arm, initial encounter: Secondary | ICD-10-CM | POA: Insufficient documentation

## 2023-01-20 DIAGNOSIS — E119 Type 2 diabetes mellitus without complications: Secondary | ICD-10-CM | POA: Insufficient documentation

## 2023-01-20 DIAGNOSIS — M75102 Unspecified rotator cuff tear or rupture of left shoulder, not specified as traumatic: Secondary | ICD-10-CM | POA: Diagnosis not present

## 2023-01-20 DIAGNOSIS — S46012A Strain of muscle(s) and tendon(s) of the rotator cuff of left shoulder, initial encounter: Secondary | ICD-10-CM | POA: Diagnosis present

## 2023-01-20 DIAGNOSIS — Z7984 Long term (current) use of oral hypoglycemic drugs: Secondary | ICD-10-CM | POA: Insufficient documentation

## 2023-01-20 DIAGNOSIS — Z9889 Other specified postprocedural states: Secondary | ICD-10-CM | POA: Diagnosis present

## 2023-01-20 DIAGNOSIS — Z8673 Personal history of transient ischemic attack (TIA), and cerebral infarction without residual deficits: Secondary | ICD-10-CM | POA: Insufficient documentation

## 2023-01-20 DIAGNOSIS — Z87891 Personal history of nicotine dependence: Secondary | ICD-10-CM | POA: Insufficient documentation

## 2023-01-20 HISTORY — PX: SHOULDER ARTHROSCOPY WITH ROTATOR CUFF REPAIR AND SUBACROMIAL DECOMPRESSION: SHX5686

## 2023-01-20 HISTORY — PX: SHOULDER ARTHROSCOPY WITH DISTAL CLAVICLE RESECTION: SHX5675

## 2023-01-20 LAB — GLUCOSE, CAPILLARY
Glucose-Capillary: 156 mg/dL — ABNORMAL HIGH (ref 70–99)
Glucose-Capillary: 193 mg/dL — ABNORMAL HIGH (ref 70–99)
Glucose-Capillary: 197 mg/dL — ABNORMAL HIGH (ref 70–99)
Glucose-Capillary: 200 mg/dL — ABNORMAL HIGH (ref 70–99)

## 2023-01-20 SURGERY — SHOULDER ARTHROSCOPY WITH ROTATOR CUFF REPAIR AND SUBACROMIAL DECOMPRESSION
Anesthesia: General | Site: Shoulder | Laterality: Left

## 2023-01-20 MED ORDER — DEXMEDETOMIDINE HCL IN NACL 80 MCG/20ML IV SOLN
INTRAVENOUS | Status: DC | PRN
  Administered 2023-01-20: 8 ug via BUCCAL
  Administered 2023-01-20: 12 ug via BUCCAL

## 2023-01-20 MED ORDER — ALBUTEROL SULFATE (2.5 MG/3ML) 0.083% IN NEBU
2.5000 mg | INHALATION_SOLUTION | RESPIRATORY_TRACT | Status: DC | PRN
  Administered 2023-01-21: 2.5 mg via RESPIRATORY_TRACT
  Filled 2023-01-20: qty 3

## 2023-01-20 MED ORDER — MENTHOL 3 MG MT LOZG: 1.0000 | LOZENGE | OROMUCOSAL | Status: DC | PRN

## 2023-01-20 MED ORDER — ACETAMINOPHEN 325 MG PO TABS: 325.0000 mg | ORAL_TABLET | Freq: Four times a day (QID) | ORAL | Status: DC | PRN

## 2023-01-20 MED ORDER — OXYCODONE HCL 5 MG PO TABS: 5.0000 mg | ORAL_TABLET | Freq: Once | ORAL | Status: DC | PRN

## 2023-01-20 MED ORDER — BUPIVACAINE-EPINEPHRINE (PF) 0.5% -1:200000 IJ SOLN
INTRAMUSCULAR | Status: DC | PRN
  Administered 2023-01-20: 10 mL via PERINEURAL

## 2023-01-20 MED ORDER — SUGAMMADEX SODIUM 200 MG/2ML IV SOLN
INTRAVENOUS | Status: DC | PRN
  Administered 2023-01-20: 200 mg via INTRAVENOUS

## 2023-01-20 MED ORDER — CHLORHEXIDINE GLUCONATE 0.12 % MT SOLN
15.0000 mL | Freq: Once | OROMUCOSAL | Status: AC
  Administered 2023-01-20: 15 mL via OROMUCOSAL

## 2023-01-20 MED ORDER — BISACODYL 10 MG RE SUPP: 10.0000 mg | Freq: Every day | RECTAL | Status: DC | PRN

## 2023-01-20 MED ORDER — CLINDAMYCIN PHOSPHATE 900 MG/50ML IV SOLN
INTRAVENOUS | Status: DC | PRN
  Administered 2023-01-20: 900 mg via INTRAVENOUS

## 2023-01-20 MED ORDER — DEXAMETHASONE SODIUM PHOSPHATE 10 MG/ML IJ SOLN
INTRAMUSCULAR | Status: AC
  Filled 2023-01-20: qty 1

## 2023-01-20 MED ORDER — POVIDONE-IODINE 10 % EX SWAB: 2.0000 | Freq: Once | CUTANEOUS | Status: DC

## 2023-01-20 MED ORDER — METOCLOPRAMIDE HCL 5 MG/ML IJ SOLN: 5.0000 mg | Freq: Three times a day (TID) | INTRAMUSCULAR | Status: DC | PRN

## 2023-01-20 MED ORDER — APIXABAN 5 MG PO TABS
5.0000 mg | ORAL_TABLET | Freq: Two times a day (BID) | ORAL | Status: DC
  Administered 2023-01-21: 5 mg via ORAL
  Filled 2023-01-20: qty 1

## 2023-01-20 MED ORDER — DIPHENHYDRAMINE HCL 12.5 MG/5ML PO ELIX: 12.5000 mg | ORAL_SOLUTION | ORAL | Status: DC | PRN

## 2023-01-20 MED ORDER — ACETAMINOPHEN 500 MG PO TABS: 1000.0000 mg | ORAL_TABLET | Freq: Once | ORAL | Status: DC

## 2023-01-20 MED ORDER — HYDROMORPHONE HCL 1 MG/ML IJ SOLN: 0.2500 mg | INTRAMUSCULAR | Status: DC | PRN

## 2023-01-20 MED ORDER — ACETAMINOPHEN 500 MG PO TABS
1000.0000 mg | ORAL_TABLET | Freq: Once | ORAL | Status: AC
  Administered 2023-01-20: 1000 mg via ORAL
  Filled 2023-01-20: qty 2

## 2023-01-20 MED ORDER — DEXAMETHASONE SODIUM PHOSPHATE 10 MG/ML IJ SOLN
INTRAMUSCULAR | Status: DC | PRN
  Administered 2023-01-20: 10 mg via INTRAVENOUS

## 2023-01-20 MED ORDER — PROPOFOL 10 MG/ML IV BOLUS
INTRAVENOUS | Status: DC | PRN
  Administered 2023-01-20: 150 mg via INTRAVENOUS

## 2023-01-20 MED ORDER — ALUM & MAG HYDROXIDE-SIMETH 200-200-20 MG/5ML PO SUSP
30.0000 mL | ORAL | Status: DC | PRN
  Administered 2023-01-21: 30 mL via ORAL
  Filled 2023-01-20: qty 30

## 2023-01-20 MED ORDER — CARVEDILOL 25 MG PO TABS
25.0000 mg | ORAL_TABLET | Freq: Two times a day (BID) | ORAL | Status: DC
  Administered 2023-01-20 – 2023-01-21 (×2): 25 mg via ORAL
  Filled 2023-01-20 (×2): qty 1

## 2023-01-20 MED ORDER — METHOCARBAMOL 500 MG PO TABS: 500.0000 mg | ORAL_TABLET | Freq: Four times a day (QID) | ORAL | Status: DC | PRN

## 2023-01-20 MED ORDER — MIDAZOLAM HCL 2 MG/2ML IJ SOLN: 1.0000 mg | INTRAMUSCULAR | Status: AC

## 2023-01-20 MED ORDER — FENTANYL CITRATE PF 50 MCG/ML IJ SOSY: 50.0000 ug | PREFILLED_SYRINGE | INTRAMUSCULAR | Status: AC

## 2023-01-20 MED ORDER — PROMETHAZINE HCL 25 MG/ML IJ SOLN: 6.2500 mg | INTRAMUSCULAR | Status: DC | PRN

## 2023-01-20 MED ORDER — METHOCARBAMOL 1000 MG/10ML IJ SOLN: 500.0000 mg | Freq: Four times a day (QID) | INTRAVENOUS | Status: DC | PRN

## 2023-01-20 MED ORDER — ACETAMINOPHEN 500 MG PO TABS
1000.0000 mg | ORAL_TABLET | Freq: Four times a day (QID) | ORAL | Status: DC
  Administered 2023-01-20 – 2023-01-21 (×3): 1000 mg via ORAL
  Filled 2023-01-20 (×3): qty 2

## 2023-01-20 MED ORDER — GLIPIZIDE 5 MG PO TABS
5.0000 mg | ORAL_TABLET | Freq: Every day | ORAL | Status: DC
  Administered 2023-01-21: 5 mg via ORAL
  Filled 2023-01-20: qty 1

## 2023-01-20 MED ORDER — BUPIVACAINE LIPOSOME 1.3 % IJ SUSP
INTRAMUSCULAR | Status: DC | PRN
  Administered 2023-01-20: 10 mL via PERINEURAL

## 2023-01-20 MED ORDER — OXYCODONE HCL 5 MG PO TABS
5.0000 mg | ORAL_TABLET | ORAL | Status: DC | PRN
  Filled 2023-01-20: qty 2

## 2023-01-20 MED ORDER — ROCURONIUM BROMIDE 10 MG/ML (PF) SYRINGE
PREFILLED_SYRINGE | INTRAVENOUS | Status: DC | PRN
  Administered 2023-01-20: 70 mg via INTRAVENOUS

## 2023-01-20 MED ORDER — ALBUTEROL SULFATE HFA 108 (90 BASE) MCG/ACT IN AERS: 1.0000 | INHALATION_SPRAY | Freq: Four times a day (QID) | RESPIRATORY_TRACT | Status: DC | PRN

## 2023-01-20 MED ORDER — POLYETHYLENE GLYCOL 3350 17 G PO PACK: 17.0000 g | PACK | Freq: Every day | ORAL | Status: DC | PRN

## 2023-01-20 MED ORDER — OXYCODONE HCL 5 MG PO TABS
10.0000 mg | ORAL_TABLET | ORAL | Status: DC | PRN
  Administered 2023-01-21: 10 mg via ORAL

## 2023-01-20 MED ORDER — MIDAZOLAM HCL 2 MG/2ML IJ SOLN
INTRAMUSCULAR | Status: AC
  Administered 2023-01-20: 2 mg via INTRAVENOUS
  Filled 2023-01-20: qty 2

## 2023-01-20 MED ORDER — PHENYLEPHRINE 80 MCG/ML (10ML) SYRINGE FOR IV PUSH (FOR BLOOD PRESSURE SUPPORT)
PREFILLED_SYRINGE | INTRAVENOUS | Status: DC | PRN
  Administered 2023-01-20 (×2): 160 ug via INTRAVENOUS

## 2023-01-20 MED ORDER — MEPERIDINE HCL 50 MG/ML IJ SOLN: 6.2500 mg | INTRAMUSCULAR | Status: DC | PRN

## 2023-01-20 MED ORDER — CLOPIDOGREL BISULFATE 75 MG PO TABS
75.0000 mg | ORAL_TABLET | Freq: Every day | ORAL | Status: DC
  Administered 2023-01-21: 75 mg via ORAL
  Filled 2023-01-20: qty 1

## 2023-01-20 MED ORDER — ORAL CARE MOUTH RINSE: 15.0000 mL | Freq: Once | OROMUCOSAL | Status: AC

## 2023-01-20 MED ORDER — POTASSIUM CHLORIDE IN NACL 20-0.9 MEQ/L-% IV SOLN
INTRAVENOUS | Status: DC
  Filled 2023-01-20 (×2): qty 1000

## 2023-01-20 MED ORDER — ONDANSETRON HCL 4 MG/2ML IJ SOLN
4.0000 mg | Freq: Four times a day (QID) | INTRAMUSCULAR | Status: DC | PRN
  Administered 2023-01-21: 4 mg via INTRAVENOUS
  Filled 2023-01-20: qty 2

## 2023-01-20 MED ORDER — SODIUM CHLORIDE 0.9 % IR SOLN
Status: DC | PRN
  Administered 2023-01-20: 3000 mL
  Administered 2023-01-20: 6000 mL
  Administered 2023-01-20 (×2): 3000 mL

## 2023-01-20 MED ORDER — MIDAZOLAM HCL 2 MG/2ML IJ SOLN: 0.5000 mg | Freq: Once | INTRAMUSCULAR | Status: DC | PRN

## 2023-01-20 MED ORDER — PHENYLEPHRINE 80 MCG/ML (10ML) SYRINGE FOR IV PUSH (FOR BLOOD PRESSURE SUPPORT)
PREFILLED_SYRINGE | INTRAVENOUS | Status: AC
  Filled 2023-01-20: qty 10

## 2023-01-20 MED ORDER — ONDANSETRON HCL 4 MG/2ML IJ SOLN
INTRAMUSCULAR | Status: DC | PRN
  Administered 2023-01-20: 4 mg via INTRAVENOUS

## 2023-01-20 MED ORDER — PHENOL 1.4 % MT LIQD: 1.0000 | OROMUCOSAL | Status: DC | PRN

## 2023-01-20 MED ORDER — PHENYLEPHRINE HCL-NACL 20-0.9 MG/250ML-% IV SOLN
INTRAVENOUS | Status: DC | PRN
  Administered 2023-01-20: 40 ug/min via INTRAVENOUS

## 2023-01-20 MED ORDER — INSULIN ASPART 100 UNIT/ML IJ SOLN
0.0000 [IU] | Freq: Three times a day (TID) | INTRAMUSCULAR | Status: DC
  Administered 2023-01-20: 3 [IU] via SUBCUTANEOUS
  Administered 2023-01-21: 2 [IU] via SUBCUTANEOUS

## 2023-01-20 MED ORDER — ATORVASTATIN CALCIUM 40 MG PO TABS
80.0000 mg | ORAL_TABLET | Freq: Every day | ORAL | Status: DC
  Administered 2023-01-21: 80 mg via ORAL
  Filled 2023-01-20: qty 2

## 2023-01-20 MED ORDER — VANCOMYCIN HCL 1500 MG/300ML IV SOLN: 1500.0000 mg | INTRAVENOUS | Status: DC

## 2023-01-20 MED ORDER — MAGNESIUM CITRATE PO SOLN: 1.0000 | Freq: Once | ORAL | Status: DC | PRN

## 2023-01-20 MED ORDER — LACTATED RINGERS IV SOLN: INTRAVENOUS | Status: DC

## 2023-01-20 MED ORDER — DOCUSATE SODIUM 100 MG PO CAPS
100.0000 mg | ORAL_CAPSULE | Freq: Two times a day (BID) | ORAL | Status: DC
  Administered 2023-01-20 – 2023-01-21 (×2): 100 mg via ORAL
  Filled 2023-01-20 (×2): qty 1

## 2023-01-20 MED ORDER — ONDANSETRON HCL 4 MG PO TABS: 4.0000 mg | ORAL_TABLET | Freq: Four times a day (QID) | ORAL | Status: DC | PRN

## 2023-01-20 MED ORDER — LIDOCAINE 2% (20 MG/ML) 5 ML SYRINGE
INTRAMUSCULAR | Status: DC | PRN
  Administered 2023-01-20: 20 mg via INTRAVENOUS

## 2023-01-20 MED ORDER — FENTANYL CITRATE PF 50 MCG/ML IJ SOSY
PREFILLED_SYRINGE | INTRAMUSCULAR | Status: AC
  Administered 2023-01-20: 100 ug via INTRAVENOUS
  Filled 2023-01-20: qty 2

## 2023-01-20 MED ORDER — HYDROMORPHONE HCL 1 MG/ML IJ SOLN: 0.5000 mg | INTRAMUSCULAR | Status: DC | PRN

## 2023-01-20 MED ORDER — METOCLOPRAMIDE HCL 5 MG PO TABS: 5.0000 mg | ORAL_TABLET | Freq: Three times a day (TID) | ORAL | Status: DC | PRN

## 2023-01-20 MED ORDER — CLINDAMYCIN PHOSPHATE 900 MG/50ML IV SOLN
INTRAVENOUS | Status: AC
  Filled 2023-01-20: qty 50

## 2023-01-20 MED ORDER — CEFAZOLIN SODIUM-DEXTROSE 2-4 GM/100ML-% IV SOLN
INTRAVENOUS | Status: AC
  Filled 2023-01-20: qty 100

## 2023-01-20 MED ORDER — DEXMEDETOMIDINE HCL IN NACL 80 MCG/20ML IV SOLN
INTRAVENOUS | Status: AC
  Filled 2023-01-20: qty 20

## 2023-01-20 MED ORDER — METFORMIN HCL ER 500 MG PO TB24
1000.0000 mg | ORAL_TABLET | Freq: Two times a day (BID) | ORAL | Status: DC
  Administered 2023-01-21: 1000 mg via ORAL
  Filled 2023-01-20: qty 2

## 2023-01-20 MED ORDER — OXYCODONE HCL 5 MG/5ML PO SOLN: 5.0000 mg | Freq: Once | ORAL | Status: DC | PRN

## 2023-01-20 SURGICAL SUPPLY — 53 items
ADH SKN CLS APL DERMABOND .7 (GAUZE/BANDAGES/DRESSINGS) ×2
AID PSTN UNV HD RSTRNT DISP (MISCELLANEOUS) ×2
ANCH SUT SWLK 19.1X4.75 (Anchor) ×6 IMPLANT
ANCHOR SUT BIO SW 4.75X19.1 (Anchor) IMPLANT
BAG COUNTER SPONGE SURGICOUNT (BAG) ×2 IMPLANT
BAG SPNG CNTER NS LX DISP (BAG) ×2
BURR CLEARCUT OVAL 5.5X13 (MISCELLANEOUS) IMPLANT
BURR OVAL 8 FLU 5.0X13 (MISCELLANEOUS) IMPLANT
CANNULA 5.75X71 LONG (CANNULA) ×2 IMPLANT
CANNULA TWIST IN 8.25X7CM (CANNULA) IMPLANT
CANNULA TWIST IN 8.25X9CM (CANNULA) IMPLANT
CLSR STERI-STRIP ANTIMIC 1/2X4 (GAUZE/BANDAGES/DRESSINGS) ×2 IMPLANT
COOLER ICEMAN CLASSIC (MISCELLANEOUS) IMPLANT
DERMABOND ADVANCED .7 DNX12 (GAUZE/BANDAGES/DRESSINGS) IMPLANT
DISSECTOR  3.8MM X 13CM (MISCELLANEOUS) ×2
DISSECTOR 3.8MM X 13CM (MISCELLANEOUS) ×2 IMPLANT
DRAPE IMP U-DRAPE 54X76 (DRAPES) ×2 IMPLANT
DRAPE INCISE IOBAN 66X45 STRL (DRAPES) ×2 IMPLANT
DRAPE SHEET LG 3/4 BI-LAMINATE (DRAPES) ×2 IMPLANT
DRAPE SHOULDER BEACH CHAIR (DRAPES) ×2 IMPLANT
DRAPE U-SHAPE 47X51 STRL (DRAPES) ×2 IMPLANT
DRESSING MEPILEX FLEX 4X4 (GAUZE/BANDAGES/DRESSINGS) IMPLANT
DRSG MEPILEX FLEX 4X4 (GAUZE/BANDAGES/DRESSINGS) ×10
DURAPREP 26ML APPLICATOR (WOUND CARE) ×4 IMPLANT
ELECT REM PT RETURN 15FT ADLT (MISCELLANEOUS) ×2 IMPLANT
GAUZE PAD ABD 8X10 STRL (GAUZE/BANDAGES/DRESSINGS) ×4 IMPLANT
GAUZE SPONGE 4X4 12PLY STRL (GAUZE/BANDAGES/DRESSINGS) ×2 IMPLANT
GLOVE BIO SURGEON STRL SZ7.5 (GLOVE) ×2 IMPLANT
GLOVE BIO SURGEON STRL SZ8 (GLOVE) ×2 IMPLANT
GLOVE BIOGEL PI IND STRL 8 (GLOVE) ×4 IMPLANT
GOWN SRG 2XL LVL 4 BRTHBL STRL (GOWNS) ×4 IMPLANT
GOWN STRL NON-REIN 2XL LVL4 (GOWNS) ×4
KIT BASIN OR (CUSTOM PROCEDURE TRAY) ×2 IMPLANT
KIT TURNOVER KIT A (KITS) IMPLANT
NDL SCORPION MULTI FIRE (NEEDLE) IMPLANT
NEEDLE SCORPION MULTI FIRE (NEEDLE) ×2 IMPLANT
PACK ARTHROSCOPY WL (CUSTOM PROCEDURE TRAY) ×2 IMPLANT
PAD COLD SHLDR WRAP-ON (PAD) IMPLANT
PENCIL SMOKE EVACUATOR (MISCELLANEOUS) IMPLANT
PORT APPOLLO RF 90DEGREE MULTI (SURGICAL WAND) ×2 IMPLANT
PROBE BIPOLAR ATHRO 135MM 90D (MISCELLANEOUS) IMPLANT
RESTRAINT HEAD UNIVERSAL NS (MISCELLANEOUS) ×2 IMPLANT
SLING ARM FOAM STRAP XLG (SOFTGOODS) IMPLANT
SLING ARM IMMOBILIZER LRG (SOFTGOODS) ×2 IMPLANT
SLING ARM IMMOBILIZER XL (CAST SUPPLIES) IMPLANT
SPIKE FLUID TRANSFER (MISCELLANEOUS) IMPLANT
SUPPORT WRAP ARM LG (MISCELLANEOUS) ×2 IMPLANT
SUT MNCRL AB 4-0 PS2 18 (SUTURE) IMPLANT
SUTURE TAPE 1.3 40 TPR END (SUTURE) IMPLANT
SUTURETAPE 1.3 40 TPR END (SUTURE)
TAPE FIBER 2MM 7IN #2 BLUE (SUTURE) IMPLANT
TOWEL OR NON WOVEN STRL DISP B (DISPOSABLE) ×2 IMPLANT
TUBING ARTHROSCOPY IRRIG 16FT (MISCELLANEOUS) ×2 IMPLANT

## 2023-01-20 NOTE — Interval H&P Note (Signed)
History and Physical Interval Note:  01/20/2023 9:20 AM  Jerry Shaffer  has presented today for surgery, with the diagnosis of Left shoulder cartilage disorder, OA, impingement, rotator cuff tear,.  The various methods of treatment have been discussed with the patient and family. After consideration of risks, benefits and other options for treatment, the patient has consented to  Procedure(s): SHOULDER ARTHROSCOPY WITH ROTATOR CUFF REPAIR AND SUBACROMIAL DECOMPRESSION (Left) SHOULDER ARTHROSCOPY WITH DISTAL CLAVICLE EXCISION (Left) as a surgical intervention.  The patient's history has been reviewed, patient examined, no change in status, stable for surgery.  I have reviewed the patient's chart and labs.  Questions were answered to the patient's satisfaction.     [name]

## 2023-01-20 NOTE — Op Note (Signed)
01/20/2023  12:39 PM  PATIENT:  Jerry Shaffer    PRE-OPERATIVE DIAGNOSIS: Left shoulder subscapularis tear, supraspinatus tear, impingement, AC joint arthrosis, biceps dislocation  POST-OPERATIVE DIAGNOSIS:  Same  PROCEDURE:    1.  Left shoulder arthroscopy with extensive debridement, biceps tendon, subscapularis, supraspinatus, superior labrum 2.  Left shoulder arthroscopic biceps tenolysis 3.  Left shoulder arthroscopy with rotator cuff repair, subscapularis 4.  Left shoulder arthroscopy with rotator cuff repair, supraspinatus 5.  Left shoulder arthroscopy with acromioplasty 6.  Left shoulder arthroscopy with distal clavicle resection  DIAGNOSES: Left shoulder, acute on chronic rotator cuff tear, SLAP tear, biceps tendinitis, AC arthritis, and subacromial impingement.  POST-OPERATIVE DIAGNOSIS: same  PROCEDURE: Arthroscopic extensive debridement - [place], Supraspinatus Tendon, and Superior Labrum Arthroscopic distal clavicle excision - 57017 Arthroscopic subacromial decompression - 79390 Arthroscopic rotator cuff repair - 30092   OPERATIVE FINDING: Exam under anesthesia: Normal Articular space: Normal Chondral surfaces: Normal Biceps:  Extensive fraying and dislocation Subscapularis: Complete tear significant retraction, difficult to achieve mobility Supraspinatus: Complete tear anterior 1.5 cm Infraspinatus: Intact   SURGEON:  [name], MD  PHYSICIAN ASSISTANT: Merlene Pulling, PA-C, present and scrubbed throughout the case, critical for completion in a timely fashion, and for retraction, instrumentation, and closure.  ANESTHESIA:   General  PREOPERATIVE INDICATIONS:  [name] is a  62 y.o. male with a diagnosis of Left shoulder cartilage disorder, OA, impingement, rotator cuff tear, who failed conservative measures and elected for surgical management.    The risks benefits and alternatives were discussed with the patient preoperatively  including but not limited to the risks of infection, bleeding, nerve injury, cardiopulmonary complications, the need for revision surgery, among others, and the patient was willing to proceed.  ESTIMATED BLOOD LOSS: Minimal  OPERATIVE IMPLANTS: Arthrex bio composite 4.75 mm swivel lock x 1 for the subscapularis, 1 for the medial row and 1 for the lateral row.  I used a fiber tape in a simple configuration for the subscapularis, and a fiber tape and FiberWire for the supraspinatus.  OPERATIVE FINDINGS: Full motion during examination under anesthesia.  His articular cartilage was overall in reasonably good condition but he had a very large subscapularis tear with retraction.  It was difficult to mobilize and required releases, but ultimately I was able to achieve mobility.  The supraspinatus tendon did not have too much retraction, and it was mainly an anterior tear.  The bone quality was mediocre at best.  OPERATIVE PROCEDURE: The patient was brought to the operating room and placed in the supine position.  General anesthesia was administered.  The left upper extremity was examined with the above named findings.  We gave him clindamycin because of a penicillin allergy.  He was placed in the beachchair position and all bony prominences padded.  Left upper extremity was prepped and draped in the usual sterile fashion.  Timeout performed.  Diagnostic arthroscopy was carried out with the above named findings.  I used the arthroscopic shaver to prepare the biceps, performed a tenolysis, and then mobilized the subscapularis with the cautery.  I prepared the bony bed with the bur.  Initially I was fairly externally rotated, and ultimately there was a portion of the cartilage which I had abraded, which ended up not being the appropriate place for the insertion, and I inserted it in the appropriate origin.  A punch was used, as well as an anchor, and I also used a scorpion suture passer to grab the subscapularis  as  inferior and medial as possible.  This advanced reasonably nicely up to the bone.  I then went to the subacromial space, performed a bursectomy, subacromial decompression with acromioplasty and CA ligament release.  There was an anterior supraspinatus tear.  I made a posterior lateral viewing portal, placed a medial anchor preloaded with fiber tape and FiberWire, passed the sutures, and then brought it into a lateral anchor.  Excellent reapposition of the tendon to the bone was achieved.  I then performed an arthroscopic distal clavicle resection removing 1 cm of bone.  The instruments were removed, the portals closed and the patient was awakened and returned to the PACU in stable and satisfactory condition.  There were no complications and he tolerated the procedure well.  Marchia Bond, MD

## 2023-01-20 NOTE — Plan of Care (Signed)
  Problem: Education: Goal: Ability to describe self-care measures that may prevent or decrease complications (Diabetes Survival Skills Education) will improve Outcome: Progressing   Problem: Activity: Goal: Ability to tolerate increased activity will improve Outcome: Progressing   Problem: Pain Management: Goal: Pain level will decrease with appropriate interventions Outcome: Progressing

## 2023-01-20 NOTE — Transfer of Care (Signed)
Immediate Anesthesia Transfer of Care Note  Patient: Jerry Shaffer  Procedure(s) Performed: SHOULDER ARTHROSCOPY WITH ROTATOR CUFF REPAIR AND SUBACROMIAL DECOMPRESSION (Left) SHOULDER ARTHROSCOPY WITH DISTAL CLAVICLE EXCISION (Left: Shoulder)  Patient Location: PACU  Anesthesia Type:GA combined with regional for post-op pain  Level of Consciousness: awake and drowsy  Airway & Oxygen Therapy: Patient Spontanous Breathing and Patient connected to face mask oxygen  Post-op Assessment: Report given to RN and Post -op Vital signs reviewed and stable  Post vital signs: Reviewed and stable  Last Vitals:  Vitals Value Taken Time  BP 106/75 01/20/23 1301  Temp    Pulse 65 01/20/23 1304  Resp 18 01/20/23 1304  SpO2 100 % 01/20/23 1304  Vitals shown include unvalidated device data.  Last Pain:  Vitals:   01/20/23 1301  TempSrc:   PainSc: Asleep      Patients Stated Pain Goal: 4 (92/11/94 1740)  Complications: No notable events documented.

## 2023-01-20 NOTE — Anesthesia Procedure Notes (Signed)
Anesthesia Regional Block: Interscalene brachial plexus block   Pre-Anesthetic Checklist: , timeout performed,  Correct Patient, Correct Site, Correct Laterality,  Correct Procedure, Correct Position, site marked,  Risks and benefits discussed,  Surgical consent,  Pre-op evaluation,  At surgeon's request and post-op pain management  Laterality: Left and Upper  Prep: chloraprep       Needles:  Injection technique: Single-shot  Needle Type: Echogenic Needle     Needle Length: 9cm  Needle Gauge: 21     Additional Needles:   Procedures:,,,, ultrasound used (permanent image in chart),,    Narrative:  Start time: 01/20/2023 9:12 AM End time: 01/20/2023 9:18 AM Injection made incrementally with aspirations every 5 mL.  Performed by: Personally  Anesthesiologist: Annye Asa, MD  Additional Notes: Pt identified in Holding room.  Monitors applied. Working IV access confirmed. Sterile prep L clavicle and neck.  #21ga ECHOgenic Arrow block needle to interscalene brachial plexus with US guidance.  10cc 0.5% Bupivacaine 1:200k epi, Exparel injected incrementally after negative test dose.  Patient asymptomatic, VSS, no heme aspirated, tolerated well.   Jenita Seashore, MD

## 2023-01-20 NOTE — Anesthesia Postprocedure Evaluation (Signed)
Anesthesia Post Note  Patient: Jerry Shaffer  Procedure(s) Performed: SHOULDER ARTHROSCOPY WITH ROTATOR CUFF REPAIR AND SUBACROMIAL DECOMPRESSION (Left) SHOULDER ARTHROSCOPY WITH DISTAL CLAVICLE EXCISION (Left: Shoulder)     Patient location during evaluation: PACU Anesthesia Type: General Level of consciousness: awake and alert, patient cooperative and oriented Pain management: pain level controlled Vital Signs Assessment: post-procedure vital signs reviewed and stable Respiratory status: spontaneous breathing, nonlabored ventilation and respiratory function stable Cardiovascular status: blood pressure returned to baseline and stable Postop Assessment: no apparent nausea or vomiting and adequate PO intake Anesthetic complications: no   No notable events documented.  Last Vitals:  Vitals:   01/20/23 1400 01/20/23 1415  BP: 129/84 (!) 148/104  Pulse: 82 88  Resp: 18 17  Temp:    SpO2: 93% 93%    Last Pain:  Vitals:   01/20/23 1415  TempSrc:   PainSc: 0-No pain                 Durfey,E. Urie Loughner

## 2023-01-20 NOTE — Anesthesia Preprocedure Evaluation (Addendum)
Anesthesia Evaluation  Patient identified by MRN, date of birth, ID band Patient awake    Reviewed: Allergy & Precautions, NPO status , Patient's Chart, lab work & pertinent test results, reviewed documented beta blocker date and time   History of Anesthesia Complications (+) PONV  Airway Mallampati: II  TM Distance: >3 FB Neck ROM: Full    Dental  (+) Dental Advisory Given   Pulmonary COPD,  COPD inhaler, former smoker   breath sounds clear to auscultation       Cardiovascular hypertension, Pt. on medications and Pt. on home beta blockers (-) angina + CAD, + Past MI and + Cardiac Stents   Rhythm:Regular Rate:Normal  09/2022 stress: Left ventricular function is grossly normal. Nuclear stress EF: 53 %. Visually appears 04-54% End diastolic cavity size is moderately enlarged. End systolic cavity size is mildly enlarged. Evidence of transient ischemic dilation (TID) noted. TID was appreciated quantitatively but not visually. Stress Combined Conclusion Findings are consistent with prior myocardial infarction. The study is low risk.  08/2022 ECHO: EF 60-65%. The LV has normal function, no regional wall motion abnormalities. There is mild LVH. RVF is normal. The right ventricular  size is normal. No significant valvular abnormalities   Neuro/Psych     Bipolar Disorder   TIA   GI/Hepatic Neg liver ROS,GERD  Controlled,,  Endo/Other  diabetes (glu 156), Oral Hypoglycemic Agents  Ozempic: last dose Thurs 01/08/2023  Renal/GU negative Renal ROS     Musculoskeletal   Abdominal  (+) + obese  Peds  Hematology Eliquis last dose Fri 01/16/2023 plavix   Anesthesia Other Findings   Reproductive/Obstetrics                             Anesthesia Physical Anesthesia Plan  ASA: 3  Anesthesia Plan: General   Post-op Pain Management: Regional block* and Tylenol PO (pre-op)*   Induction: Intravenous  PONV Risk  Score and Plan: 3 and Ondansetron, Dexamethasone and Treatment may vary due to age or medical condition  Airway Management Planned: Oral ETT  Additional Equipment: None  Intra-op Plan:   Post-operative Plan: Extubation in OR  Informed Consent: I have reviewed the patients History and Physical, chart, labs and discussed the procedure including the risks, benefits and alternatives for the proposed anesthesia with the patient or authorized representative who has indicated his/her understanding and acceptance.     Dental advisory given  Plan Discussed with: CRNA and Surgeon  Anesthesia Plan Comments: (Plan routine monitors, GETA with interscalene block for post op analgesia)        Anesthesia Quick Evaluation

## 2023-01-20 NOTE — Anesthesia Procedure Notes (Signed)
Procedure Name: Intubation Date/Time: 01/20/2023 10:31 AM  Performed by: [place] Manus Rudd, CRNAPre-anesthesia Checklist: Patient identified, Emergency Drugs available, Suction available and Patient being monitored Patient Re-evaluated:Patient Re-evaluated prior to induction Oxygen Delivery Method: Circle system utilized Preoxygenation: Pre-oxygenation with 100% oxygen Induction Type: IV induction Ventilation: Mask ventilation without difficulty Laryngoscope Size: Mac and 3 Grade View: Grade II Tube type: Oral Tube size: 7.5 mm Number of attempts: 1 Airway Equipment and Method: Stylet and Oral airway Placement Confirmation: ETT inserted through vocal cords under direct vision, positive ETCO2 and breath sounds checked- equal and bilateral Secured at: 21 cm Tube secured with: Tape Dental Injury: Teeth and Oropharynx as per pre-operative assessment

## 2023-01-21 DIAGNOSIS — S46012A Strain of muscle(s) and tendon(s) of the rotator cuff of left shoulder, initial encounter: Secondary | ICD-10-CM | POA: Diagnosis not present

## 2023-01-21 LAB — BASIC METABOLIC PANEL
Anion gap: 10 (ref 5–15)
BUN: 15 mg/dL (ref 6–20)
CO2: 22 mmol/L (ref 22–32)
Calcium: 8.4 mg/dL — ABNORMAL LOW (ref 8.9–10.3)
Chloride: 106 mmol/L (ref 98–111)
Creatinine, Ser: 0.61 mg/dL (ref 0.61–1.24)
GFR, Estimated: >60 mL/min (ref >60)
Glucose, Bld: 179 mg/dL — ABNORMAL HIGH (ref 70–99)
Potassium: 3.8 mmol/L (ref 3.5–5.1)
Sodium: 138 mmol/L (ref 135–145)

## 2023-01-21 LAB — CBC
HCT: 39 % (ref 39.0–52.0)
Hemoglobin: 11.9 g/dL — ABNORMAL LOW (ref 13.0–17.0)
MCH: 27.2 pg (ref 26.0–34.0)
MCHC: 30.5 g/dL (ref 30.0–36.0)
MCV: 89 fL (ref 80.0–100.0)
Platelets: 227 10*3/uL (ref 150–400)
RBC: 4.38 MIL/uL (ref 4.22–5.81)
RDW: 13.9 % (ref 11.5–15.5)
WBC: 11.3 10*3/uL — ABNORMAL HIGH (ref 4.0–10.5)
nRBC: 0 % (ref 0.0–0.2)

## 2023-01-21 LAB — GLUCOSE, CAPILLARY: Glucose-Capillary: 149 mg/dL — ABNORMAL HIGH (ref 70–99)

## 2023-01-21 MED ORDER — SENNA-DOCUSATE SODIUM 8.6-50 MG PO TABS: 2.0000 | ORAL_TABLET | Freq: Every day | ORAL | 1 refills | Status: DC

## 2023-01-21 MED ORDER — OXYCODONE HCL 5 MG PO TABS: 5.0000 mg | ORAL_TABLET | ORAL | 0 refills | Status: DC | PRN

## 2023-01-21 MED ORDER — METHOCARBAMOL 500 MG PO TABS: 500.0000 mg | ORAL_TABLET | Freq: Three times a day (TID) | ORAL | 0 refills | Status: DC | PRN

## 2023-01-21 MED ORDER — ONDANSETRON HCL 4 MG PO TABS: 4.0000 mg | ORAL_TABLET | Freq: Three times a day (TID) | ORAL | 0 refills | Status: DC | PRN

## 2023-01-21 NOTE — Discharge Summary (Signed)
Discharge Summary  Patient ID: Jerry Shaffer MRN: [id number] DOB/AGE: December 13, 1961 62 y.o.  Admit date: 01/20/2023 Discharge date: 01/21/2023  Admission Diagnoses:  S/P arthroscopy of left shoulder  Discharge Diagnoses:  Principal Problem:   S/P arthroscopy of left shoulder   Past Medical History:  Diagnosis Date   Atrial flutter ([place])    Bipolar 1 disorder ([place])    Coronary artery disease    Diabetes mellitus    Dyslipidemia    Emphysema    GERD (gastroesophageal reflux disease)    History of kidney stones    Hypertension    Mental disorder    BIPOLAR DISORDER   Migraine    NSTEMI (non-ST elevated myocardial infarction) ([place]) 09/20/2020   Pneumonia    PONV (postoperative nausea and vomiting)    Renal disorder    Shortness of breath    Stroke ([place])    Transient ischemic attack (TIA)     Surgeries: Procedure(s): SHOULDER ARTHROSCOPY WITH ROTATOR CUFF REPAIR AND SUBACROMIAL DECOMPRESSION SHOULDER ARTHROSCOPY WITH DISTAL CLAVICLE EXCISION on 01/20/2023   Consultants (if any):   Discharged Condition: Improved  Hospital Course: Jerry Shaffer is an 62 y.o. male who was admitted 01/20/2023 with a diagnosis of S/P arthroscopy of left shoulder and went to the operating room on 01/20/2023 and underwent the above named procedures.    He was given perioperative antibiotics:  Anti-infectives (From admission, onward)    Start     Dose/Rate Route Frequency Ordered Stop   01/20/23 1040  clindamycin (CLEOCIN) 900 MG/50ML IVPB       Note to Pharmacy: Randa Evens D: cabinet override      01/20/23 1040 01/20/23 1045   01/20/23 0840  ceFAZolin (ANCEF) 2-4 GM/100ML-% IVPB       Note to Pharmacy: Randa Evens D: cabinet override      01/20/23 0840 01/20/23 2044   01/20/23 0830  vancomycin (VANCOREADY) IVPB 1500 mg/300 mL  Status:  Discontinued        1,500 mg 150 mL/hr over 120 Minutes Intravenous On call to O.R. 01/20/23 1601 01/20/23 1601     .  He was given  sequential compression devices, early ambulation, and home Eliquis/Plavix for DVT prophylaxis.  He benefited maximally from the hospital stay and there were no complications.    Recent vital signs:  Vitals:   01/21/23 0554 01/21/23 0921  BP: 125/75 (!) 149/92  Pulse: 84 82  Resp: 17 18  Temp: 97.7 F (36.5 C) 97.7 F (36.5 C)  SpO2: 95% 99%    Recent laboratory studies:  Lab Results  Component Value Date   HGB 11.9 (L) 01/21/2023   HGB 12.5 (L) 01/08/2023   HGB 12.3 (L) 09/29/2022   Lab Results  Component Value Date   WBC 11.3 (H) 01/21/2023   PLT 227 01/21/2023   Lab Results  Component Value Date   INR 1.00 08/24/2015   Lab Results  Component Value Date   NA 138 01/21/2023   K 3.8 01/21/2023   CL 106 01/21/2023   CO2 22 01/21/2023   BUN 15 01/21/2023   CREATININE 0.61 01/21/2023   GLUCOSE 179 (H) 01/21/2023    Discharge Medications:   Allergies as of 01/21/2023       Reactions   Bee Venom Anaphylaxis   Penicillins Anaphylaxis   Did it involve swelling of the face/tongue/throat, SOB, or low BP? yes Did it involve sudden or severe rash/hives, skin peeling, or any reaction on the inside of your mouth or  nose? unknown Did you need to seek medical attention at a hospital or doctor's office? yes When did it last happen?      1967 If all above answers are "NO", may proceed with cephalosporin use.   Procaine Anaphylaxis   Isosorbide Other (See Comments)   Caused migraines   Tape Itching, Other (See Comments)   Certain medical tapes make the patient's skin ITCH        Medication List     TAKE these medications    albuterol 108 (90 Base) MCG/ACT inhaler Commonly known as: VENTOLIN HFA Inhale 1-2 puffs into the lungs every 6 (six) hours as needed for wheezing or shortness of breath.   apixaban 5 MG Tabs tablet Commonly known as: ELIQUIS Take 1 tablet (5 mg total) by mouth 2 (two) times daily.   atorvastatin 80 MG tablet Commonly known as: LIPITOR TAKE  1 TABLET BY MOUTH EVERY DAY   carvedilol 25 MG tablet Commonly known as: Coreg Take 1 tablet (25 mg total) by mouth 2 (two) times daily with a meal.   clopidogrel 75 MG tablet Commonly known as: PLAVIX TAKE 1 TABLET(75 MG) BY MOUTH DAILY WITH BREAKFAST   glipiZIDE 5 MG tablet Commonly known as: GLUCOTROL Take 1 tablet (5 mg total) by mouth daily before breakfast.   metFORMIN 500 MG 24 hr tablet Commonly known as: GLUCOPHAGE-XR Take 4 tablets (2,000 mg total) by mouth daily. What changed:  how much to take when to take this   methocarbamol 500 MG tablet Commonly known as: ROBAXIN Take 1 tablet (500 mg total) by mouth every 8 (eight) hours as needed for muscle spasms.   ondansetron 4 MG tablet Commonly known as: Zofran Take 1 tablet (4 mg total) by mouth every 8 (eight) hours as needed for nausea or vomiting.   oxyCODONE 5 MG immediate release tablet Commonly known as: Roxicodone Take 1 tablet (5 mg total) by mouth every 4 (four) hours as needed for severe pain.   Ozempic (0.25 or 0.5 MG/DOSE) 2 MG/3ML Sopn Generic drug: Semaglutide(0.25 or 0.'5MG'$ /DOS) Inject 0.5 mg into the skin once a week.   sennosides-docusate sodium 8.6-50 MG tablet Commonly known as: SENOKOT-S Take 2 tablets by mouth daily.        Diagnostic Studies: No results found.  Disposition: Discharge disposition: 01-Home or Self Care          Follow-up Information     Marchia Bond, MD. Schedule an appointment as soon as possible for a visit in 2 week(s).   Specialty: Orthopedic Surgery Contact information: [address] [place] [address] [phone]                  Signed: Jola Baptist 01/21/2023, 11:52 AM

## 2023-01-21 NOTE — Plan of Care (Signed)
  Problem: Education: Goal: Ability to describe self-care measures that may prevent or decrease complications (Diabetes Survival Skills Education) will improve Outcome: Progressing Goal: Individualized Educational Video(s) Outcome: Progressing   Problem: Coping: Goal: Ability to adjust to condition or change in health will improve Outcome: Progressing   Problem: Fluid Volume: Goal: Ability to maintain a balanced intake and output will improve Outcome: Progressing   Problem: Health Behavior/Discharge Planning: Goal: Ability to identify and utilize available resources and services will improve Outcome: Progressing Goal: Ability to manage health-related needs will improve Outcome: Progressing   Problem: Metabolic: Goal: Ability to maintain appropriate glucose levels will improve Outcome: Progressing   Problem: Nutritional: Goal: Maintenance of adequate nutrition will improve Outcome: Progressing Goal: Progress toward achieving an optimal weight will improve Outcome: Progressing   Problem: Skin Integrity: Goal: Risk for impaired skin integrity will decrease Outcome: Progressing   Problem: Tissue Perfusion: Goal: Adequacy of tissue perfusion will improve Outcome: Progressing   Problem: Education: Goal: Knowledge of the prescribed therapeutic regimen will improve Outcome: Progressing Goal: Understanding of activity limitations/precautions following surgery will improve Outcome: Progressing Goal: Individualized Educational Video(s) Outcome: Progressing   Problem: Activity: Goal: Ability to tolerate increased activity will improve Outcome: Progressing   Problem: Pain Management: Goal: Pain level will decrease with appropriate interventions Outcome: Progressing   Problem: Education: Goal: Knowledge of General Education information will improve Description: Including pain rating scale, medication(s)/side effects and non-pharmacologic comfort measures Outcome:  Progressing   Problem: Health Behavior/Discharge Planning: Goal: Ability to manage health-related needs will improve Outcome: Progressing   Problem: Clinical Measurements: Goal: Ability to maintain clinical measurements within normal limits will improve Outcome: Progressing Goal: Will remain free from infection Outcome: Progressing Goal: Diagnostic test results will improve Outcome: Progressing Goal: Respiratory complications will improve Outcome: Progressing Goal: Cardiovascular complication will be avoided Outcome: Progressing   Problem: Activity: Goal: Risk for activity intolerance will decrease Outcome: Progressing   Problem: Nutrition: Goal: Adequate nutrition will be maintained Outcome: Progressing   Problem: Coping: Goal: Level of anxiety will decrease Outcome: Progressing   Problem: Elimination: Goal: Will not experience complications related to bowel motility Outcome: Progressing Goal: Will not experience complications related to urinary retention Outcome: Progressing   Problem: Pain Managment: Goal: General experience of comfort will improve Outcome: Progressing   Problem: Safety: Goal: Ability to remain free from injury will improve Outcome: Progressing   Problem: Skin Integrity: Goal: Risk for impaired skin integrity will decrease Outcome: Progressing

## 2023-01-21 NOTE — Discharge Instructions (Signed)
Diet: As you were doing prior to hospitalization   Shower/Dressing:  May shower 3 days after surgery but keep the wounds dry, use an occlusive plastic wrap, NO SOAKING IN TUB.  If the bandage gets wet, change with a clean dry gauze.  You may change your dressing 3-5 days after surgery with sterile gauze. Please use good hand washing technique before changing the dressing.   Activity:  Increase activity slowly as tolerated, but follow the weight bearing instructions below.  The rules on driving is that you can not be taking narcotics while you drive, and you must feel in control of the vehicle.    Weight Bearing:  No bearing weight with left arm.   To prevent constipation: you may use a stool softener such as -  Colace (over the counter) 100 mg by mouth twice a day  Drink plenty of fluids (prune juice may be helpful) and high fiber foods Miralax (over the counter) for constipation as needed.    Itching:  If you experience itching with your medications, try taking only a single pain pill, or even half a pain pill at a time.  You may take up to 10 pain pills per day, and you can also use benadryl over the counter for itching or also to help with sleep.   Precautions:  If you experience chest pain or shortness of breath - call 911 immediately for transfer to the hospital emergency department!!  If you develop a fever greater that 101 F, purulent drainage from wound, increased redness or drainage from wound, or calf pain -- Call the office at [phone]                                                Follow- Up Appointment:  Please call for an appointment to be seen in 2 weeks [place] - [phone]

## 2023-01-21 NOTE — Progress Notes (Signed)
Subjective: 1 Day Post-Op s/p Procedure(s): SHOULDER ARTHROSCOPY WITH ROTATOR CUFF REPAIR AND SUBACROMIAL DECOMPRESSION SHOULDER ARTHROSCOPY WITH DISTAL CLAVICLE EXCISION   Patient is alert, oriented. Patient reports pain as overall well controlled but starting to get some twinges of pain at the shoulder. Patient had some SOB yesterday but had a nebulizer treatment and this has resolved. No nausea, vomiting,  abdominal pain.     Objective:  PE: VITALS:   Vitals:   01/21/23 0027 01/21/23 0146 01/21/23 0554 01/21/23 0921  BP:  (!) 140/86 125/75 (!) 149/92  Pulse:  80 84 82  Resp:  '17 17 18  '$ Temp:  98.2 F (36.8 C) 97.7 F (36.5 C) 97.7 F (36.5 C)  TempSrc:  Oral Oral Oral  SpO2: 95% 95% 95% 99%  Weight:      Height:       General: sitting up on side of bed, in no acute distress Resp: normal respiratory effort MSK: Able to flex, extend, and abduct all fingers of left hand. Distal sensation intact to all fingers. Dressing with moderate drainage. Redressed with mepilex dressings.   LABS  Results for orders placed or performed during the hospital encounter of 01/20/23 (from the past 24 hour(s))  Glucose, capillary     Status: Abnormal   Collection Time: 01/20/23  2:56 PM  Result Value Ref Range   Glucose-Capillary 193 (H) 70 - 99 mg/dL  Glucose, capillary     Status: Abnormal   Collection Time: 01/20/23  5:03 PM  Result Value Ref Range   Glucose-Capillary 197 (H) 70 - 99 mg/dL  Glucose, capillary     Status: Abnormal   Collection Time: 01/20/23  9:28 PM  Result Value Ref Range   Glucose-Capillary 200 (H) 70 - 99 mg/dL  CBC     Status: Abnormal   Collection Time: 01/21/23  3:37 AM  Result Value Ref Range   WBC 11.3 (H) 4.0 - 10.5 K/uL   RBC 4.38 4.22 - 5.81 MIL/uL   Hemoglobin 11.9 (L) 13.0 - 17.0 g/dL   HCT 39.0 39.0 - 52.0 %   MCV 89.0 80.0 - 100.0 fL   MCH 27.2 26.0 - 34.0 pg   MCHC 30.5 30.0 - 36.0 g/dL   RDW 13.9 11.5 - 15.5 %   Platelets 227 150 - 400  K/uL   nRBC 0.0 0.0 - 0.2 %  Basic metabolic panel     Status: Abnormal   Collection Time: 01/21/23  3:37 AM  Result Value Ref Range   Sodium 138 135 - 145 mmol/L   Potassium 3.8 3.5 - 5.1 mmol/L   Chloride 106 98 - 111 mmol/L   CO2 22 22 - 32 mmol/L   Glucose, Bld 179 (H) 70 - 99 mg/dL   BUN 15 6 - 20 mg/dL   Creatinine, Ser 0.61 0.61 - 1.24 mg/dL   Calcium 8.4 (L) 8.9 - 10.3 mg/dL   GFR, Estimated >60 >60 mL/min   Anion gap 10 5 - 15  Glucose, capillary     Status: Abnormal   Collection Time: 01/21/23  7:32 AM  Result Value Ref Range   Glucose-Capillary 149 (H) 70 - 99 mg/dL    No results found.  Assessment/Plan: Principal Problem:   S/P arthroscopy of left shoulder  1 Day Post-Op s/p Procedure(s): SHOULDER ARTHROSCOPY WITH ROTATOR CUFF REPAIR AND SUBACROMIAL DECOMPRESSION SHOULDER ARTHROSCOPY WITH DISTAL CLAVICLE EXCISION  Weightbearing: NWB LUE, sling at all times Insicional and dressing care: Reinforce as needed VTE  prophylaxis: restarted home Eliquis and Plavix today Pain control: continue current regimen Follow - up plan: 2 weeks with Dr. Mardelle Matte Dispo: Plan to discharge home later today after working with pT  Contact information:   Merlene Pulling, PA-C Weekdays 8-5  After hours and holidays please check [email or web address] for group call information for [facility] 01/21/2023, 9:52 AM

## 2023-01-21 NOTE — Evaluation (Signed)
Occupational Therapy Evaluation Patient Details Name: Jerry Shaffer MRN: [id number] DOB: 07-20-1961 Today's Date: 01/21/2023   History of Present Illness Patient s/p left Left shoulder arthroscopy with rotator cuff repair, subscapularis repair, supraspinatus repair   Clinical Impression   [name] is a 62 year old man s/p shoulder surgery without functional use of left non-dominant upper extremity secondary to effects of surgery and shoulder precautions. Therapist provided education and instruction to patient in regards to exercises, precautions, positioning, donning upper extremity clothing and bathing while maintaining shoulder precautions, ice and edema management and donning/doffing sling. Patient very experienced with sling and compensatory strategies due to recent injury. Patient verbalized understanding of instruction in regards to ROM restrictions and weight bearing status.Marland Kitchen He is unable to use the ice cuff effectively due to having consistent assist at home - therapist educated on compensatory positioning. He has Prn assistance of a neighbor. Patient to follow up with MD for further therapy needs.        Recommendations for follow up therapy are one component of a multi-disciplinary discharge planning process, led by the attending physician.  Recommendations may be updated based on patient status, additional functional criteria and insurance authorization.   Follow Up Recommendations  Follow physician's recommendations for discharge plan and follow up therapies     Assistance Recommended at Discharge PRN  Patient can return home with the following Assistance with cooking/housework    Functional Status Assessment  Patient has had a recent decline in their functional status and demonstrates the ability to make significant improvements in function in a reasonable and predictable amount of time.  Equipment Recommendations  None recommended by OT    Recommendations for  Other Services       Precautions / Restrictions Precautions Precaution Comments: No AROm, NO PROM per orders Restrictions Weight Bearing Restrictions: Yes LUE Weight Bearing: Non weight bearing Other Position/Activity Restrictions: Okay for Elbow, wrist and hand exercises      Mobility Bed Mobility               General bed mobility comments: at EOB    Transfers Overall transfer level: Modified independent                        Balance Overall balance assessment: No apparent balance deficits (not formally assessed)                                         ADL either performed or assessed with clinical judgement   ADL Overall ADL's : Needs assistance/impaired Eating/Feeding: Modified independent   Grooming: Modified independent   Upper Body Bathing: Modified independent Upper Body Bathing Details (indicate cue type and reason): educated on dangle method to clean underneath arm Lower Body Bathing: Modified independent   Upper Body Dressing : Minimal assistance Upper Body Dressing Details (indicate cue type and reason): Min assist to reposition shirt Lower Body Dressing: Modified independent Lower Body Dressing Details (indicate cue type and reason): discussed use of device to assit with pulling up pants on left side Toilet Transfer: Modified Independent   Toileting- Clothing Manipulation and Hygiene: Modified independent               Vision Patient Visual Report: No change from baseline       Perception     Praxis      Pertinent Vitals/Pain Pain  Assessment Pain Assessment: Faces Faces Pain Scale: Hurts little more Pain Location: left shoulder Pain Descriptors / Indicators: Grimacing, Sore Pain Intervention(s): Monitored during session     Hand Dominance Right   Extremity/Trunk Assessment Upper Extremity Assessment Upper Extremity Assessment: RUE deficits/detail;LUE deficits/detail RUE Deficits / Details: WFL ROM  and strength, decreased normal ROM from prior injuries but functional RUE Sensation: WNL RUE Coordination: WNL LUE Deficits / Details: [place] ROM restricted - has functional elbow, wrist and hand ROM   Lower Extremity Assessment Lower Extremity Assessment: Overall WFL for tasks assessed   Cervical / Trunk Assessment Cervical / Trunk Assessment: Normal   Communication Communication Communication: No difficulties   Cognition Arousal/Alertness: Awake/alert Behavior During Therapy: WFL for tasks assessed/performed Overall Cognitive Status: Within Functional Limits for tasks assessed                                       General Comments       Exercises     Shoulder Instructions Shoulder Instructions Donning/doffing shirt without moving shoulder: Modified independent Method for sponge bathing under operated UE: Independent Donning/doffing sling/immobilizer: Modified independent Correct positioning of sling/immobilizer: Independent ROM for elbow, wrist and digits of operated UE: Independent Sling wearing schedule (on at all times/off for ADL's): Independent Proper positioning of operated UE when showering: Independent Dressing change: Independent Positioning of UE while sleeping: [place] expects to be discharged to:: Private residence Living Arrangements: Alone Available Help at Discharge: Available PRN/intermittently;Neighbor                                    Prior Functioning/Environment                          OT Problem List: Decreased strength;Decreased range of motion;Obesity;Impaired UE functional use;Pain      OT Treatment/Interventions:      OT Goals(Current goals can be found in the care plan section) Acute Rehab OT Goals OT Goal Formulation: All assessment and education complete, DC therapy  OT Frequency:      Co-evaluation              AM-PAC OT "6 Clicks" Daily Activity      Outcome Measure Help from another person eating meals?: None Help from another person taking care of personal grooming?: None Help from another person toileting, which includes using toliet, bedpan, or urinal?: None Help from another person bathing (including washing, rinsing, drying)?: None Help from another person to put on and taking off regular upper body clothing?: A Little Help from another person to put on and taking off regular lower body clothing?: None 6 Click Score: 23   End of Session Nurse Communication:  (OT education complete)  Activity Tolerance: Patient tolerated treatment well Patient left: in chair;with call bell/phone within reach  OT Visit Diagnosis: Pain Pain - Right/Left: Left Pain - part of body: Shoulder                Time: 4034-7425 OT Time Calculation (min): 20 min Charges:  OT General Charges $OT Visit: 1 Visit OT Evaluation $OT Eval Low Complexity: 1 Low  Gustavo Lah, OTR/L [place]  Office [phone]     Lenward Chancellor 01/21/2023, 9:53 AM

## 2023-01-26 ENCOUNTER — Encounter (HOSPITAL_COMMUNITY): Payer: Self-pay | Admitting: Orthopedic Surgery

## 2023-02-03 ENCOUNTER — Telehealth: Payer: Self-pay | Admitting: Internal Medicine

## 2023-02-03 NOTE — Telephone Encounter (Signed)
Patient is here in office saying that he is out of Ozempic (0.25 or 0.5 MG/DOSE) Semaglutide,0.25 or 0.5MG/DOS, (OZEMPIC, 0.25 OR 0.5 MG/DOSE,) 2 MG/3ML SOPN And he has not heard anything from Patient Assistance and wants to know what he can do to get more Ozempic.

## 2023-02-03 NOTE — Telephone Encounter (Signed)
Spoke with pt and advised we have not received an update. Application faxed to [employer] again. A sample of Ozempic was provided for pt.

## 2023-02-09 ENCOUNTER — Other Ambulatory Visit: Payer: Self-pay | Admitting: Student

## 2023-02-09 ENCOUNTER — Telehealth: Payer: Self-pay | Admitting: Cardiovascular Disease

## 2023-02-09 MED ORDER — APIXABAN 5 MG PO TABS: 5.0000 mg | ORAL_TABLET | Freq: Two times a day (BID) | ORAL | 5 refills | Status: DC

## 2023-02-09 NOTE — Telephone Encounter (Signed)
*  STAT* If patient is at the pharmacy, call can be transferred to refill team.   1. Which medications need to be refilled? (please list name of each medication and dose if known)   apixaban (ELIQUIS) 5 MG TABS tablet   2. Which pharmacy/location (including street and city if local pharmacy) is medication to be sent to?  [id number] - [place], [place]   3. Do they need a 30 day or 90 day supply?   60 day  Patient stated he is completely out of this medication.

## 2023-02-09 NOTE — Telephone Encounter (Signed)
Prescription refill request for Eliquis received.  Indication: afib  Last office visit: [place], 10/28/2022 Scr: 0.61, 01/21/2023 Age: 62 yo  Weight: 112.9 kg   Refill sent.

## 2023-02-13 ENCOUNTER — Encounter: Payer: Self-pay | Admitting: Cardiovascular Disease

## 2023-02-13 ENCOUNTER — Ambulatory Visit: Payer: 59 | Attending: Cardiovascular Disease | Admitting: Cardiovascular Disease

## 2023-02-13 VITALS — BP 112/72 | HR 97 | Ht 68.0 in | Wt 244.6 lb

## 2023-02-13 DIAGNOSIS — Z794 Long term (current) use of insulin: Secondary | ICD-10-CM

## 2023-02-13 DIAGNOSIS — E1159 Type 2 diabetes mellitus with other circulatory complications: Secondary | ICD-10-CM

## 2023-02-13 DIAGNOSIS — I700 Atherosclerosis of aorta: Secondary | ICD-10-CM

## 2023-02-13 DIAGNOSIS — E785 Hyperlipidemia, unspecified: Secondary | ICD-10-CM

## 2023-02-13 DIAGNOSIS — I25118 Atherosclerotic heart disease of native coronary artery with other forms of angina pectoris: Secondary | ICD-10-CM

## 2023-02-13 DIAGNOSIS — I4892 Unspecified atrial flutter: Secondary | ICD-10-CM

## 2023-02-13 NOTE — Progress Notes (Signed)
Cardiology Office Note:    Date:  02/13/2023   ID:  Jerry Shaffer, DOB 1961-06-28, MRN [id number]  PCP:  Patient, No Pcp Per  [place] Cardiologist:  Sanda Klein, MD  [facility] Electrophysiologist:  Melida [place], MD   Referring MD: No ref. provider found   Chief Complaint  Patient presents with   Irregular Heart Beat   Coronary Artery Disease    History of Present Illness:    Jerry Shaffer is a 62 y.o. male with a hx of rcoronary artery disease presenting with inferior NSTEMI on September 20, 2020 due to occlusion of the mid PDA emergency PCI-drug-eluting stent (18 x 2.75 mm Onyx), subsequently presented with unstable angina and underwent PCI-drug-eluting stent (3.5 x 20 mm Synergy) to the proximal LAD on March 28, 2021, with residual moderate stenosis in the mid LAD and mild stenosis in the diagonal artery and 50% mid circumflex stenosis;  also known to have aortic atherosclerosis,  migraine headaches, dyslipidemia (low HDL), type 2 diabetes mellitus, adult ADHD, chronic smoker.  He underwent successful ablation for typical atrial flutter by Dr. Myles Gip in September 2023 and has not had any sustained arrhythmia recurrence since then.  He remains on anticoagulant therapy per his recommendations.  He has trouble with dyspnea when walking uphill, but not on level ground.  He has not had angina at rest or with activity.  He has not had dizziness, syncope, falls or overt serious bleeding problems.  He denies orthopnea or PND.  He has not had focal neurological events.  About a year after his accident he has finally undergone left shoulder surgery.  He is still wearing his left arm in a sling.  He has an even older severe injury to his right shoulder where he might need a total joint replacement.  Since his atrial flutter ablation he has not had any sustained palpitations or episodes of chest pain.  He does have occasional very brief palpitations which he compares to the  feeling you get in your chest at the bottom of a roller coaster dip, lasting for just a split-second.  He presented with angina pectoris when he had sustained tachycardia from atrial flutter.  This has not happened since ablation.  He stopped using vape.  He continues to have a cough that is sometimes productive of him up to a sputum.  Because of this he underwent a CT angiogram several months ago that did not show any evidence of lung masses or infiltrates.  He was in particular worried about tuberculosis, which one of his uncles had.  There was no evidence of infection.  He is currently still taking both clopidogrel and Eliquis.  Occasionally takes [facility] powders for headaches.  He had a low risk nuclear stress test in October 2023 (old inferior and inferolateral scar, no ischemia, EF 53%).  EF was normal at 60-65% by echo in September 2023, mild LVH, no serious valvular abnormalities.  Metabolic control is fair.  His most recent hemoglobin A1c was 7.5% (a slight improvement).  His LDL is excellent at 28 but has a chronically low HDL of 23.  He has normal renal function parameters.    Past Medical History:  Diagnosis Date   Atrial flutter ([place])    Bipolar 1 disorder ([place])    Coronary artery disease    Diabetes mellitus    Dyslipidemia    Emphysema    GERD (gastroesophageal reflux disease)    History of kidney stones  Hypertension    Mental disorder    BIPOLAR DISORDER   Migraine    NSTEMI (non-ST elevated myocardial infarction) ([place]) 09/20/2020   Pneumonia    PONV (postoperative nausea and vomiting)    Renal disorder    Shortness of breath    Stroke ([place])    Transient ischemic attack (TIA)     Past Surgical History:  Procedure Laterality Date   A-FLUTTER ABLATION N/A 09/08/2022   Procedure: A-FLUTTER ABLATION;  Surgeon: Mealor, Yetta Barre, MD;  Location: [place] [facility];  Service: Cardiovascular;  Laterality: N/A;   COLONOSCOPY WITH PROPOFOL N/A 10/17/2021   Procedure:  COLONOSCOPY WITH PROPOFOL;  Surgeon: Milus Banister, MD;  Location: [facility];  Service: Endoscopy;  Laterality: N/A;   CORONARY ANGIOGRAPHY  09/20/2020   CORONARY STENT INTERVENTION  09/20/2020   CORONARY STENT INTERVENTION   CORONARY STENT INTERVENTION N/A 09/20/2020   Procedure: CORONARY STENT INTERVENTION;  Surgeon: Belva Crome, MD;  Location: [place] [facility];  Service: Cardiovascular;  Laterality: N/A;   CORONARY STENT INTERVENTION N/A 03/28/2021   Procedure: CORONARY STENT INTERVENTION;  Surgeon: [name], MD;  Location: [place] [facility];  Service: Cardiovascular;  Laterality: N/A;   HEMORROIDECTOMY     KNEE ARTHROSCOPY     LEFT HEART CATH AND CORONARY ANGIOGRAPHY N/A 09/20/2020   Procedure: LEFT HEART CATH AND CORONARY ANGIOGRAPHY;  Surgeon: Belva Crome, MD;  Location: [place] [facility];  Service: Cardiovascular;  Laterality: N/A;   LEFT HEART CATH AND CORONARY ANGIOGRAPHY N/A 03/28/2021   Procedure: LEFT HEART CATH AND CORONARY ANGIOGRAPHY;  Surgeon: [name], MD;  Location: [place] [facility];  Service: Cardiovascular;  Laterality: N/A;   LUMBAR DISC SURGERY     POLYPECTOMY  10/17/2021   Procedure: POLYPECTOMY;  Surgeon: Milus Banister, MD;  Location: [facility];  Service: Endoscopy;;   SHOULDER ARTHROSCOPY WITH DISTAL CLAVICLE RESECTION Left 01/20/2023   Procedure: SHOULDER ARTHROSCOPY WITH DISTAL CLAVICLE EXCISION;  Surgeon: Marchia Bond, MD;  Location: [facility];  Service: Orthopedics;  Laterality: Left;   SHOULDER ARTHROSCOPY WITH ROTATOR CUFF REPAIR AND SUBACROMIAL DECOMPRESSION Left 01/20/2023   Procedure: SHOULDER ARTHROSCOPY WITH ROTATOR CUFF REPAIR AND SUBACROMIAL DECOMPRESSION;  Surgeon: Marchia Bond, MD;  Location: [facility];  Service: Orthopedics;  Laterality: Left;   TONSILLECTOMY AND ADENOIDECTOMY      Current Medications: Current Meds  Medication Sig   apixaban (ELIQUIS) 5 MG TABS tablet Take 1 tablet (5 mg total) by mouth 2 (two) times  daily.   atorvastatin (LIPITOR) 80 MG tablet TAKE 1 TABLET BY MOUTH EVERY DAY   carvedilol (COREG) 25 MG tablet Take 1 tablet (25 mg total) by mouth 2 (two) times daily with a meal.   glipiZIDE (GLUCOTROL) 5 MG tablet Take 1 tablet (5 mg total) by mouth daily before breakfast.   metFORMIN (GLUCOPHAGE-XR) 500 MG 24 hr tablet Take 4 tablets (2,000 mg total) by mouth daily. (Patient taking differently: Take 1,000 mg by mouth 2 (two) times daily with a meal.)   methocarbamol (ROBAXIN) 500 MG tablet Take 1 tablet (500 mg total) by mouth every 8 (eight) hours as needed for muscle spasms.   Semaglutide,0.25 or 0.'5MG'$ /DOS, (OZEMPIC, 0.25 OR 0.5 MG/DOSE,) 2 MG/3ML SOPN Inject 0.5 mg into the skin once a week.   sennosides-docusate sodium (SENOKOT-S) 8.6-50 MG tablet Take 2 tablets by mouth daily.   [DISCONTINUED] clopidogrel (PLAVIX) 75 MG tablet TAKE 1 TABLET(75 MG) BY MOUTH DAILY WITH BREAKFAST  Allergies:   Bee venom, Penicillins, Procaine, Isosorbide, and Tape   Social History   Socioeconomic History   Marital status: Single    Spouse name: Not on file   Number of children: Not on file   Years of education: Not on file   Highest education level: Not on file  Occupational History   Occupation: Security guard    Employer: [employer]  Tobacco Use   Smoking status: Former    Types: E-cigarettes   Smokeless tobacco: Former    Quit date: 2022  Vaping Use   Vaping Use: Former  Substance and Sexual Activity   Alcohol use: No   Drug use: Not Currently   Sexual activity: Not Currently  Other Topics Concern   Not on file  Social History Narrative   Not on file   Social Determinants of Health   Financial Resource Strain: Not on file  Food Insecurity: Food Insecurity Present (09/09/2022)   Hunger Vital Sign    Worried About [place] in the Last Year: Sometimes true    Ran Out of Food in the Last Year: Never true  Transportation Needs: No Transportation Needs (09/06/2022)    PRAPARE - Hydrologist (Medical): No    Lack of Transportation (Non-Medical): No  Physical Activity: Not on file  Stress: Not on file  Social Connections: Not on file     Family History: The patient's family history includes Bipolar disorder in an other family member; CAD (age of onset: 80) in his mother; Coronary artery disease in an other family member; Diabetes type II in an other family member; Lung cancer in his father.  ROS:   Please see the history of present illness.     All other systems reviewed and are negative.  EKGs/Labs/Other Studies Reviewed:    The following studies were reviewed today:  Lexiscan Myoview 10/03/2022    Findings are consistent with prior myocardial infarction. The study is overall low risk given no significant change in LV function or perfusion defects since last exam.   No ST deviation was noted.   LV perfusion is abnormal. There is no evidence of ischemia. There is evidence of infarction. Defect 1: There is a medium defect with moderate reduction in uptake present in the apical to basal inferior and inferolateral location(s) that is fixed. There is abnormal wall motion in the defect area. Consistent with infarction.   Left ventricular function is grossly normal. Nuclear stress EF: 53 %. Visually appears 0000000 End diastolic cavity size is moderately enlarged. End systolic cavity size is mildly enlarged. Evidence of transient ischemic dilation (TID) noted, 1.22. TID was appreciated quantitatively but not visually.   Prior study available for comparison from 03/08/2021. No changes compared to prior study.    ECHO 09/06/2022   1. Left ventricular ejection fraction, by estimation, is 60 to 65%. The  left ventricle has normal function. The left ventricle has no regional  wall motion abnormalities. There is mild left ventricular hypertrophy.  Left ventricular diastolic parameters  are indeterminate.   2. Right ventricular  systolic function is normal. The right ventricular  size is normal.   3. Left atrial size was mildly dilated.   4. The mitral valve is normal in structure. No evidence of mitral valve  regurgitation. No evidence of mitral stenosis.   5. The aortic valve was not well visualized. Aortic valve regurgitation  is not visualized. No aortic stenosis is present.   6.  The inferior vena cava is normal in size with greater than 50%  respiratory variability, suggesting right atrial pressure of 3 mmHg.   Cath 09/20/2020 Total occlusion of the mid PDA (a large vessel that extends to the left ventricular apex), filling sluggishly by apical LAD to PDA collaterals.  This vessel is felt to represent the culprit for the patient's presentation. Total occlusion of the mid PDA reduced to 0% using an 18 x 2.75 mm Onyx deployed at 12 atm x 2 inflations.  TIMI grade III flow was noted.  Resolution of chest pain occurred.  Proximal to the stented segment there is a 30 to 40% stenosis in the PDA. Left main is widely patent LAD contains mid vessel disease with a Medina 011 bifurcation stenosis.  LAD is 70% and diagonal is 40%.  This can be managed medically. Circumflex contains segmental 40 to 50% stenosis in the large second obtuse marginal. RCA is dominant, tortuous, and no significant disease other than that noted above in the PDA. Mid inferior wall to apical akinesis.  LVEDP 6 mmHg.  EF 45 to 50%.     RECOMMENDATIONS:   Aspirin and Plavix for 1 year. IV nitroglycerin discontinued. Check hemoglobin A1c. Aggressive statin therapy for risk reduction. Smoking cessation Potential discharge in 24 to 36 hours depending upon clinical course.  Cardiac catheterization 03/28/2021.  1.  Patent left main 2.  Severe stenosis of the proximal LAD, new from cardiac catheterization 6 months ago, treated successfully with PCI using a 3.5 x 20 mm Synergy DES.  The mid LAD is unchanged from the prior study with Medina 0, 1, 1  bifurcation disease with only moderate stenosis in the LAD and mild stenosis in the diagonal 3.  Moderate 50% mid circumflex stenosis 4.  Continued patency of the right PDA stent with mild diffuse nonobstructive PDA and RCA stenosis 5.  Normal LVEDP   Recommendations: Continue dual antiplatelet therapy with aspirin and clopidogrel at least 6 months without interruption.  Should be eligible for hospital discharge tomorrow.    Dominance: Right   Coronary Diagrams   Diagnostic Dominance: Right    Intervention     Implants     Permanent Stent   Synergy Xd 3.50x20 [id number] - Implanted     EKG: EKG is not ordered today.  Tracing from 01/28/2022 shows normal sinus rhythm, deep but very sharp inferior Q waves, otherwise completely normal tracing. Recent Labs: 09/05/2022: ALT 22; B Natriuretic Peptide 101.3 09/06/2022: TSH 0.853 09/09/2022: Magnesium 1.8 01/21/2023: BUN 15; Creatinine, Ser 0.61; Hemoglobin 11.9; Platelets 227; Potassium 3.8; Sodium 138  Recent Lipid Panel    Component Value Date/Time   CHOL 85 09/06/2022 0226   CHOL 87 (L) 08/15/2021 0839   TRIG 169 (H) 09/06/2022 0226   HDL 23 (L) 09/06/2022 0226   HDL 26 (L) 08/15/2021 0839   CHOLHDL 3.7 09/06/2022 0226   VLDL 34 09/06/2022 0226   LDLCALC 28 09/06/2022 0226   LDLCALC 45 08/15/2021 0839     Risk Assessment/Calculations:       Physical Exam:    VS:  BP 112/72 (BP Location: Right Arm, Patient Position: Sitting, Cuff Size: Large)   Pulse 97   Ht '5\' 8"'$  (1.727 m)   Wt 244 lb 9.6 oz (110.9 kg)   SpO2 97%   BMI 37.19 kg/m     Wt Readings from Last 3 Encounters:  02/13/23 244 lb 9.6 oz (110.9 kg)  01/20/23 248 lb 12.8 oz (112.9 kg)  01/08/23  248 lb 12.8 oz (112.9 kg)    General: Alert, oriented x3, no distress, severely obese Head: no evidence of trauma, PERRL, EOMI, no exophtalmos or lid lag, no myxedema, no xanthelasma; normal ears, nose and oropharynx Neck: normal jugular venous pulsations  and no hepatojugular reflux; brisk carotid pulses without delay and no carotid bruits Chest: clear to auscultation, no signs of consolidation by percussion or palpation, normal fremitus, symmetrical and full respiratory excursions Cardiovascular: normal position and quality of the apical impulse, regular rhythm, normal first and second heart sounds, no murmurs, rubs or gallops Abdomen: no tenderness or distention, no masses by palpation, no abnormal pulsatility or arterial bruits, normal bowel sounds, no hepatosplenomegaly Extremities: no clubbing, cyanosis or edema; 2+ radial, ulnar and brachial pulses bilaterally; 2+ right femoral, posterior tibial and dorsalis pedis pulses; 2+ left femoral, posterior tibial and dorsalis pedis pulses; no subclavian or femoral bruits Neurological: grossly nonfocal Psych: Normal mood and affect      ASSESSMENT:    1. Coronary artery disease of native artery of native heart with stable angina pectoris ([place])   2. Dyslipidemia, goal LDL below 70   3. Type 2 diabetes mellitus with other circulatory complication, with long-term current use of insulin ([place])   4. Aortic atherosclerosis ([place])   5. Severe obesity (BMI 35.0-39.9) with comorbidity ([place])   6. Atrial flutter with rapid ventricular response (H[facility])       PLAN:    In order of problems listed above:  CAD: Currently asymptomatic and his most recent nuclear stress test showed low risk findings (scar without ischemia).  Had non-STEMI and underwent PCI-DES to PDA in October 2021 with subsequent unstable angina for which she underwent placement of a DES to the proximal LAD, with moderate residual disease in the left circumflex, mid LAD and first diagonal.  Currently does not have angina, but is also less physically active than usual.  He is having some problems with hemoptysis due to bronchitis and concomitant treatment with Eliquis.  Will discontinue the clopidogrel.  On appropriate lipid-lowering therapy, but  has chronically low HDL. HLP: Excellent LDL on statin, but very low HDL chronically.  Weight loss would be beneficial. Smoking: He has stopped smoking and has stopped vaping.  I suspect that he has chronic bronchitis as a cause for his hemopoietic sputum.  CT a few months ago did not show any masses or infiltrates. DM: Is been a slight improvement in his hemoglobin A1c down to 7.5%, but ideally would like it to be less than 7%.  Have discussed healthy diet. Aortic atherosclerosis: Noted incidentally on imaging studies.  No evidence of aneurysmal change. Obesity: He lost a little bit of weight last year, weight is now stagnating in the severely elevated range with a BMI of 37. Atrial flutter status post ablation: No clinical recurrence.  He does have some brief palpitations.  I encouraged him to get a Kardia device or smart watch to help monitor for arrhythmia problems.  He will do so when this becomes financially possible.  Continuing Eliquis for now based on EP recommendations, but if he has need to restart dual antiplatelet therapy for vascular problems we may choose to discontinue this if there has been no arrhythmia recurrence. Shoulder injury: Status post recent left shoulder surgery.  Shared Decision Making/Informed Consent      This procedure has been fully reviewed with the patient and written informed consent has been obtained.    Medication Adjustments/Labs and Tests Ordered: Current medicines are reviewed  at length with the patient today.  Concerns regarding medicines are outlined above.  No orders of the defined types were placed in this encounter.   No orders of the defined types were placed in this encounter.    Patient Instructions  Medication Instructions:  Stop Plavix 75 mg as directed. Continue Eliquis 5 mg twice daily as directed.  *If you need a refill on your cardiac medications before your next appointment, please call your pharmacy*   Lab Work: NONE ordered at  this time of appointment   If you have labs (blood work) drawn today and your tests are completely normal, you will receive your results only by: [place] (if you have MyChart) OR A paper copy in the mail If you have any lab test that is abnormal or we need to change your treatment, we will call you to review the results.   Testing/Procedures: NONE ordered at this time of appointment     Follow-Up: At [facility], you and your health needs are our priority.  As part of our continuing mission to provide you with exceptional heart care, we have created designated Provider Care Teams.  These Care Teams include your primary Cardiologist (physician) and Advanced Practice Providers (APPs -  Physician Assistants and Nurse Practitioners) who all work together to provide you with the care you need, when you need it.  We recommend signing up for the patient portal called "MyChart".  Sign up information is provided on this After Visit Summary.  MyChart is used to connect with patients for Virtual Visits (Telemedicine).  Patients are able to view lab/test results, encounter notes, upcoming appointments, etc.  Non-urgent messages can be sent to your provider as well.   To learn more about what you can do with MyChart, go to [email or web address].    Your next appointment:   6 month(s)  Provider:   Sanda Klein, MD     Other Instructions     Signed, Sanda Klein, MD  02/13/2023 3:27 PM    [place]

## 2023-02-13 NOTE — Patient Instructions (Addendum)
Medication Instructions:  Stop Plavix 75 mg as directed. Continue Eliquis 5 mg twice daily as directed.  *If you need a refill on your cardiac medications before your next appointment, please call your pharmacy*   Lab Work: NONE ordered at this time of appointment   If you have labs (blood work) drawn today and your tests are completely normal, you will receive your results only by: [place] (if you have MyChart) OR A paper copy in the mail If you have any lab test that is abnormal or we need to change your treatment, we will call you to review the results.   Testing/Procedures: NONE ordered at this time of appointment     Follow-Up: At [facility], you and your health needs are our priority.  As part of our continuing mission to provide you with exceptional heart care, we have created designated Provider Care Teams.  These Care Teams include your primary Cardiologist (physician) and Advanced Practice Providers (APPs -  Physician Assistants and Nurse Practitioners) who all work together to provide you with the care you need, when you need it.  We recommend signing up for the patient portal called "MyChart".  Sign up information is provided on this After Visit Summary.  MyChart is used to connect with patients for Virtual Visits (Telemedicine).  Patients are able to view lab/test results, encounter notes, upcoming appointments, etc.  Non-urgent messages can be sent to your provider as well.   To learn more about what you can do with MyChart, go to [email or web address].    Your next appointment:   6 month(s)  Provider:   Sanda Klein, MD     Other Instructions

## 2023-03-05 ENCOUNTER — Ambulatory Visit (INDEPENDENT_AMBULATORY_CARE_PROVIDER_SITE_OTHER): Payer: 59 | Admitting: Internal Medicine

## 2023-03-05 ENCOUNTER — Encounter: Payer: Self-pay | Admitting: Internal Medicine

## 2023-03-05 VITALS — BP 130/88 | HR 101 | Ht 68.0 in | Wt 243.8 lb

## 2023-03-05 DIAGNOSIS — E1165 Type 2 diabetes mellitus with hyperglycemia: Secondary | ICD-10-CM

## 2023-03-05 DIAGNOSIS — E1159 Type 2 diabetes mellitus with other circulatory complications: Secondary | ICD-10-CM

## 2023-03-05 DIAGNOSIS — E785 Hyperlipidemia, unspecified: Secondary | ICD-10-CM | POA: Diagnosis not present

## 2023-03-05 NOTE — Patient Instructions (Addendum)
Please continue: - Metformin ER 1000 mg 2x daily - Ozempic 0.5 mg weekly in a.m.  If you are not able to get Ozempic, you can increase: - Glipizide 10 mg before b'fast   Please return in 3-4 months with your sugar log.

## 2023-03-05 NOTE — Progress Notes (Signed)
Patient ID: Jerry Shaffer, male   DOB: 11-12-1961, 62 y.o.   MRN: [id number]  HPI: Jerry Shaffer is a 62 y.o.-year-old male, returning for follow-up for DM2, dx in 2013, non insulin-dependent (on insulin briefly in 2016, and then again in 2021, but off now), uncontrolled, with complications (CAD, TIA, PN). Pt. previously saw Dr. Loanne Drilling, but last visit with me 4 months ago.  Interim history: + increased urination, but no blurry vision, nausea, chest pain. He started Ozempic 1 month prior to our last visit-he has occasional acid reflux. Takes Gaviscon with good results. He had L shoulder surgery 01/20/2023 >> still hurts. He is avoiding fried food and tries to avoid spicy foods. He likes [place] food. Unfortunately, he cannot afford Ozempic and he was not able to obtain it from the patient assistance program-did not hear from the program although we submitted the application twice.  He has been relying on samples.  Reviewed HbA1c: Lab Results  Component Value Date   HGBA1C 7.5 (H) 01/08/2023   HGBA1C 8.3 (A) 11/04/2022   HGBA1C 8.6 (H) 09/06/2022   HGBA1C 9.0 (A) 08/26/2022   HGBA1C 10.0 (A) 04/18/2022   HGBA1C 7.8 (A) 01/17/2022   HGBA1C 7.3 (A) 10/11/2021   HGBA1C 7.1 (A) 07/12/2021   HGBA1C 6.1 (A) 04/19/2021   HGBA1C 9.4 (A) 01/11/2021   Pt is on a regimen of: - Metformin ER 1000 mg 2x daily >> occas. Loose stools - Glipizide 5 mg before bedtime >> before breakfast - Ozempic 0.25 >> 0.5 mg weekly He has needle phobia with syringes.  He has no problems injecting insulin with pens. He did not want to try Farxiga due to fear of side effects. He was on Trulicity >> could not obtain it.  He was on [place] >> not affordable.  Pt checks his sugars 2-3x a day and they are: - am: going to bed: 120-200s >> 120-130 >> 89-139 - 2h after b'fast: n/c - before lunch: n/c - 2h after lunch: n/c - before dinner: waking up: n/c - 30-45 min after dinner (PB or ham sandwich): 130-192, 200s  >> 130, ave 145, 213 >> 128-150 - bedtime: n/c - nighttime: n/c Lowest sugar was 22 (many years ago - on 10 mg Glipizide then) >> 90 >> 80s; he has hypoglycemia awareness at 70.  Highest sugar was 999 (long time ago)  >> 213 >> 240 (ate late).  Glucometer:?  - no CKD, last BUN/creatinine:  Lab Results  Component Value Date   BUN 15 01/21/2023   BUN 22 (H) 01/08/2023   CREATININE 0.61 01/21/2023   CREATININE 0.90 01/08/2023  He is not on ACE inhibitor/ARB.  - He has HL; last set of lipids: Lab Results  Component Value Date   CHOL 85 09/06/2022   HDL 23 (L) 09/06/2022   LDLCALC 28 09/06/2022   TRIG 169 (H) 09/06/2022   CHOLHDL 3.7 09/06/2022  On Lipitor 80 mg daily.  - last eye exam was "several years ago". No DR reportedly.   - + numbness and tingling in his feet.  Last foot exam 11/04/2022.  He also has a history of bipolar disease, emphysema, migraines. He was in the ED with A-fib with RVR 09/06/2022. He had to have an ablation.  ROS: + see HPI  Past Medical History:  Diagnosis Date   Atrial flutter ([place])    Bipolar 1 disorder ([place])    Coronary artery disease    Diabetes mellitus    Dyslipidemia  Emphysema    GERD (gastroesophageal reflux disease)    History of kidney stones    Hypertension    Mental disorder    BIPOLAR DISORDER   Migraine    NSTEMI (non-ST elevated myocardial infarction) ([place]) 09/20/2020   Pneumonia    PONV (postoperative nausea and vomiting)    Renal disorder    Shortness of breath    Stroke ([place])    Transient ischemic attack (TIA)    Past Surgical History:  Procedure Laterality Date   A-FLUTTER ABLATION N/A 09/08/2022   Procedure: A-FLUTTER ABLATION;  Surgeon: Melida Quitter, MD;  Location: [facility];  Service: Cardiovascular;  Laterality: N/A;   COLONOSCOPY WITH PROPOFOL N/A 10/17/2021   Procedure: COLONOSCOPY WITH PROPOFOL;  Surgeon: Milus Banister, MD;  Location: [facility];  Service: Endoscopy;  Laterality: N/A;    CORONARY ANGIOGRAPHY  09/20/2020   CORONARY STENT INTERVENTION  09/20/2020   CORONARY STENT INTERVENTION   CORONARY STENT INTERVENTION N/A 09/20/2020   Procedure: CORONARY STENT INTERVENTION;  Surgeon: Belva Crome, MD;  Location: [facility];  Service: Cardiovascular;  Laterality: N/A;   CORONARY STENT INTERVENTION N/A 03/28/2021   Procedure: CORONARY STENT INTERVENTION;  Surgeon: [name], MD;  Location: [facility];  Service: Cardiovascular;  Laterality: N/A;   HEMORROIDECTOMY     KNEE ARTHROSCOPY     LEFT HEART CATH AND CORONARY ANGIOGRAPHY N/A 09/20/2020   Procedure: LEFT HEART CATH AND CORONARY ANGIOGRAPHY;  Surgeon: Belva Crome, MD;  Location: [facility];  Service: Cardiovascular;  Laterality: N/A;   LEFT HEART CATH AND CORONARY ANGIOGRAPHY N/A 03/28/2021   Procedure: LEFT HEART CATH AND CORONARY ANGIOGRAPHY;  Surgeon: [name], MD;  Location: [facility];  Service: Cardiovascular;  Laterality: N/A;   LUMBAR DISC SURGERY     POLYPECTOMY  10/17/2021   Procedure: POLYPECTOMY;  Surgeon: Milus Banister, MD;  Location: [facility];  Service: Endoscopy;;   SHOULDER ARTHROSCOPY WITH DISTAL CLAVICLE RESECTION Left 01/20/2023   Procedure: SHOULDER ARTHROSCOPY WITH DISTAL CLAVICLE EXCISION;  Surgeon: Marchia Bond, MD;  Location: [facility];  Service: Orthopedics;  Laterality: Left;   SHOULDER ARTHROSCOPY WITH ROTATOR CUFF REPAIR AND SUBACROMIAL DECOMPRESSION Left 01/20/2023   Procedure: SHOULDER ARTHROSCOPY WITH ROTATOR CUFF REPAIR AND SUBACROMIAL DECOMPRESSION;  Surgeon: Marchia Bond, MD;  Location: [facility];  Service: Orthopedics;  Laterality: Left;   TONSILLECTOMY AND ADENOIDECTOMY     Social History   Socioeconomic History   Marital status: Single    Spouse name: Not on file   Number of children: Not on file   Years of education: Not on file   Highest education level: Not on file  Occupational History   Occupation: Security guard     Employer: [employer]  Tobacco Use   Smoking status: Former    Types: E-cigarettes   Smokeless tobacco: Former    Quit date: 2022  Vaping Use   Vaping Use: Former  Substance and Sexual Activity   Alcohol use: No   Drug use: Not Currently   Sexual activity: Not Currently  Other Topics Concern   Not on file  Social History Narrative   Not on file   Social Determinants of Health   Financial Resource Strain: Not on file  Food Insecurity: Food Insecurity Present (09/09/2022)   Hunger Vital Sign    Worried About Running Out of Food in the Last Year: Sometimes true    Ran Out of Food in  the Last Year: Never true  Transportation Needs: No Transportation Needs (09/06/2022)   PRAPARE - Hydrologist (Medical): No    Lack of Transportation (Non-Medical): No  Physical Activity: Not on file  Stress: Not on file  Social Connections: Not on file  Intimate Partner Violence: Not At Risk (09/06/2022)   Humiliation, Afraid, Rape, and Kick questionnaire    Fear of Current or Ex-Partner: No    Emotionally Abused: No    Physically Abused: No    Sexually Abused: No   Current Outpatient Medications on File Prior to Visit  Medication Sig Dispense Refill   albuterol (VENTOLIN HFA) 108 (90 Base) MCG/ACT inhaler Inhale 1-2 puffs into the lungs every 6 (six) hours as needed for wheezing or shortness of breath. (Patient not taking: Reported on 02/13/2023)     apixaban (ELIQUIS) 5 MG TABS tablet Take 1 tablet (5 mg total) by mouth 2 (two) times daily. 60 tablet 5   atorvastatin (LIPITOR) 80 MG tablet TAKE 1 TABLET BY MOUTH EVERY DAY 30 tablet 11   carvedilol (COREG) 25 MG tablet Take 1 tablet (25 mg total) by mouth 2 (two) times daily with a meal. 180 tablet 1   glipiZIDE (GLUCOTROL) 5 MG tablet Take 1 tablet (5 mg total) by mouth daily before breakfast. 90 tablet 3   metFORMIN (GLUCOPHAGE-XR) 500 MG 24 hr tablet Take 4 tablets (2,000 mg total) by mouth daily. (Patient taking  differently: Take 1,000 mg by mouth 2 (two) times daily with a meal.) 360 tablet 3   methocarbamol (ROBAXIN) 500 MG tablet Take 1 tablet (500 mg total) by mouth every 8 (eight) hours as needed for muscle spasms. 30 tablet 0   ondansetron (ZOFRAN) 4 MG tablet Take 1 tablet (4 mg total) by mouth every 8 (eight) hours as needed for nausea or vomiting. (Patient not taking: Reported on 02/13/2023) 10 tablet 0   oxyCODONE (ROXICODONE) 5 MG immediate release tablet Take 1 tablet (5 mg total) by mouth every 4 (four) hours as needed for severe pain. (Patient not taking: Reported on 02/13/2023) 30 tablet 0   Semaglutide,0.25 or 0.5MG /DOS, (OZEMPIC, 0.25 OR 0.5 MG/DOSE,) 2 MG/3ML SOPN Inject 0.5 mg into the skin once a week. 3 mL 5   sennosides-docusate sodium (SENOKOT-S) 8.6-50 MG tablet Take 2 tablets by mouth daily. 30 tablet 1   No current facility-administered medications on file prior to visit.   Allergies  Allergen Reactions   Bee Venom Anaphylaxis   Penicillins Anaphylaxis    Did it involve swelling of the face/tongue/throat, SOB, or low BP? yes Did it involve sudden or severe rash/hives, skin peeling, or any reaction on the inside of your mouth or nose? unknown Did you need to seek medical attention at a hospital or doctor's office? yes When did it last happen?      1967 If all above answers are "NO", may proceed with cephalosporin use.    Procaine Anaphylaxis   Isosorbide Other (See Comments)    Caused migraines   Tape Itching and Other (See Comments)    Certain medical tapes make the patient's skin ITCH   Family History  Problem Relation Age of Onset   Diabetes type II Other    Coronary artery disease Other    Bipolar disorder Other    Lung cancer Father    CAD Mother 17   PE: BP 130/88 (BP Location: Right Arm, Patient Position: Sitting, Cuff Size: Normal)   Pulse (!) 101  Ht 5\' 8"  (1.727 m)   Wt 243 lb 12.8 oz (110.6 kg)   SpO2 95%   BMI 37.07 kg/m  Wt Readings from Last 3  Encounters:  03/05/23 243 lb 12.8 oz (110.6 kg)  02/13/23 244 lb 9.6 oz (110.9 kg)  01/20/23 248 lb 12.8 oz (112.9 kg)   Constitutional: overweight, in NAD Eyes: no exophthalmos ENT: no thyromegaly, no cervical lymphadenopathy Cardiovascular: RRR, No MRG Respiratory: CTA B Musculoskeletal: no deformities Skin:  + rash on L foot Neurological: no tremor with outstretched hands  ASSESSMENT: 1. DM2, noninsulin-dependent, but off insulin now, uncontrolled, with complications - CAD, s/p NSTEMI - cerebro-vascular disease, s/p TIA - PN  2. HL  3.  Obesity class III  PLAN:  1. Patient with longstanding, uncontrolled, type 2 diabetes, on oral antidiabetic regimen with metformin and sulfonylurea, to which we added a GLP-1 receptor agonist.  At last visit, HbA1c was better, at 8.3%, however, since then, he had another HbA1c obtained 2 months ago and this was even lower, at 7.5%.  He had problems obtaining Ozempic and was given samples.  Of note, he does have acid reflux with Ozempic and is on Gaviscon.  This works well for him. -At today's visit, sugars appear to be mostly at goal, which sugars higher usually in the day prior to his injection.  Also, sugars are higher in the morning if he has a snack at night.  He is trying to cut these out.  For now, we discussed about continuing the same regimen, however, he may need to increase his glipizide whenever he is not able to obtain Ozempic.  He was given a sample pen today. - I suggested to:  Patient Instructions  Please continue: - Metformin ER 1000 mg 2x daily - Ozempic 0.5 mg weekly in a.m.  If you are not able to get Ozempic, you can increase: - Glipizide 10 mg before b'fast   Please return in 3-4 months with your sugar log.   - advised to check sugars at different times of the day - 1x a day, rotating check times - advised for yearly eye exams >> he is UTD - return to clinic in 3-4 months  2. HL -Reviewed latest lipid panel from  08/2022: LDL at goal, triglycerides slightly high, HDL low: Lab Results  Component Value Date   CHOL 85 09/06/2022   HDL 23 (L) 09/06/2022   LDLCALC 28 09/06/2022   TRIG 169 (H) 09/06/2022   CHOLHDL 3.7 09/06/2022  -He continues on Lipitor 80 mg daily without side effects  3.  Obesity class III -He continues on Ozempic which should also help with weight loss, but we submitted his application to PAP again today -Also given a sample pen of Ozempic -He lost 13 pounds since last visit!  Philemon Kingdom, MD PhD [facility]

## 2023-03-30 ENCOUNTER — Telehealth: Payer: Self-pay | Admitting: Internal Medicine

## 2023-03-30 NOTE — Telephone Encounter (Signed)
Pt provided sample of Ozempic.

## 2023-03-30 NOTE — Telephone Encounter (Signed)
Patient is here saying that he has not heard anything from patient assistance about his Ozempic (0.25 or 0.5 MG/DOSE) Semaglutide,0.25 or 0.5MG /DOS, (OZEMPIC, 0.25 OR 0.5 MG/DOSE,) 2 MG/3ML SOPN.  Patient ran out of Ozempic last Thursday, 03/26/2023.

## 2023-04-05 ENCOUNTER — Other Ambulatory Visit: Payer: Self-pay | Admitting: Cardiovascular Disease

## 2023-04-06 NOTE — Telephone Encounter (Signed)
Rx request sent to pharmacy.  

## 2023-04-28 ENCOUNTER — Telehealth: Payer: Self-pay | Admitting: Internal Medicine

## 2023-04-28 NOTE — Telephone Encounter (Signed)
Patient is in the lobby. He requesting a sample of Ozempic (0.25 or 0.5 MG/DOSE) Semaglutide,0.25 or 0.5MG /DOS, (OZEMPIC, 0.25 OR 0.5 MG/DOSE,) 2 MG/3ML SOPN.I  Patient states he has not heard anything from patient assistance about his medication.

## 2023-04-28 NOTE — Telephone Encounter (Signed)
Pt given sample Ozempic and number to contact patient assistance for follow up.

## 2023-05-19 ENCOUNTER — Telehealth: Payer: Self-pay | Admitting: Internal Medicine

## 2023-05-19 NOTE — Telephone Encounter (Signed)
Patient provided with another sample and given the paperwork to call [employer] to check on application.

## 2023-05-19 NOTE — Telephone Encounter (Signed)
    Patient is in the lobby. He requesting a sample of Ozempic (0.25 or 0.5 MG/DOSE) Semaglutide,0.25 or 0.5MG /DOS, (OZEMPIC, 0.25 OR 0.5 MG/DOSE,) 2 MG/3ML SOPN.I   Patient states he still has not heard anything from patient assistance about his medication.

## 2023-05-24 ENCOUNTER — Emergency Department (HOSPITAL_COMMUNITY)
Admission: EM | Admit: 2023-05-24 | Discharge: 2023-05-24 | Disposition: A | Discharge status: Home / Self Care | Payer: 59 | Attending: Emergency Medicine | Admitting: Emergency Medicine

## 2023-05-24 ENCOUNTER — Other Ambulatory Visit: Payer: Self-pay

## 2023-05-24 ENCOUNTER — Encounter (HOSPITAL_COMMUNITY): Payer: Self-pay

## 2023-05-24 ENCOUNTER — Emergency Department (HOSPITAL_COMMUNITY): Payer: 59

## 2023-05-24 DIAGNOSIS — S99922A Unspecified injury of left foot, initial encounter: Secondary | ICD-10-CM

## 2023-05-24 DIAGNOSIS — Z7901 Long term (current) use of anticoagulants: Secondary | ICD-10-CM | POA: Insufficient documentation

## 2023-05-24 DIAGNOSIS — Z7984 Long term (current) use of oral hypoglycemic drugs: Secondary | ICD-10-CM | POA: Insufficient documentation

## 2023-05-24 DIAGNOSIS — Z23 Encounter for immunization: Secondary | ICD-10-CM | POA: Insufficient documentation

## 2023-05-24 DIAGNOSIS — E119 Type 2 diabetes mellitus without complications: Secondary | ICD-10-CM | POA: Insufficient documentation

## 2023-05-24 DIAGNOSIS — W450XXA Nail entering through skin, initial encounter: Secondary | ICD-10-CM | POA: Insufficient documentation

## 2023-05-24 DIAGNOSIS — S91332A Puncture wound without foreign body, left foot, initial encounter: Secondary | ICD-10-CM | POA: Insufficient documentation

## 2023-05-24 DIAGNOSIS — Z794 Long term (current) use of insulin: Secondary | ICD-10-CM | POA: Insufficient documentation

## 2023-05-24 MED ORDER — CEPHALEXIN 500 MG PO CAPS
1000.0000 mg | ORAL_CAPSULE | Freq: Once | ORAL | Status: AC
  Administered 2023-05-24: 1000 mg via ORAL
  Filled 2023-05-24: qty 2

## 2023-05-24 MED ORDER — CEPHALEXIN 500 MG PO CAPS: 1000.0000 mg | ORAL_CAPSULE | Freq: Two times a day (BID) | ORAL | 0 refills | Status: DC

## 2023-05-24 MED ORDER — TETANUS-DIPHTH-ACELL PERTUSSIS 5-2.5-18.5 LF-MCG/0.5 IM SUSY
0.5000 mL | PREFILLED_SYRINGE | Freq: Once | INTRAMUSCULAR | Status: AC
  Administered 2023-05-24: 0.5 mL via INTRAMUSCULAR
  Filled 2023-05-24: qty 0.5

## 2023-05-24 NOTE — ED Notes (Signed)
Patient's left foot elevated, ice applied.

## 2023-05-24 NOTE — ED Provider Notes (Signed)
[place] EMERGENCY DEPARTMENT AT [facility] Provider Note   CSN: [id number] Arrival date & time: 05/24/23  4098     History  Chief Complaint  Patient presents with   Foot Injury   HPI Jerry Shaffer is a 62 y.o. male with type 2 diabetes, history of TIA, NSTEMI and bipolar 1 disorder presenting for foot injury.  States he was walking around his house and stepped on an old rusty nail around 6 PM yesterday evening.  Nail pierced into the plantar aspect of his left foot near the base of his fourth and fifth toes.  States he has some pain around the puncture site.  Denies that the nail went through his foot but states it felt like it "touched the bone".  Patient concerned with associated infection.  Also thinks his last tetanus shot was at least 10 years ago.  States he now has pain with walking on that foot but still able to bear weight.   Foot Injury      Home Medications Prior to Admission medications   Medication Sig Start Date End Date Taking? Authorizing Provider  albuterol (VENTOLIN HFA) 108 (90 Base) MCG/ACT inhaler Inhale 1-2 puffs into the lungs every 6 (six) hours as needed for wheezing or shortness of breath. Patient not taking: Reported on 02/13/2023    [provider]  apixaban (ELIQUIS) 5 MG TABS tablet Take 1 tablet (5 mg total) by mouth 2 (two) times daily. 02/09/23   Croitoru, Mihai, MD  atorvastatin (LIPITOR) 80 MG tablet TAKE 1 TABLET BY MOUTH EVERY DAY 07/07/22   Jodelle Gross, NP  carvedilol (COREG) 25 MG tablet TAKE 1 TABLET(25 MG) BY MOUTH TWICE DAILY WITH A MEAL 04/06/23   Croitoru, Mihai, MD  cephALEXin (KEFLEX) 500 MG capsule Take 2 capsules (1,000 mg total) by mouth 2 (two) times daily. 05/24/23  Yes Gareth Eagle, PA-C  glipiZIDE (GLUCOTROL) 5 MG tablet Take 1 tablet (5 mg total) by mouth daily before breakfast. 10/06/22   Carlus Pavlov, MD  metFORMIN (GLUCOPHAGE-XR) 500 MG 24 hr tablet Take 4 tablets (2,000 mg total) by mouth  daily. Patient taking differently: Take 1,000 mg by mouth 2 (two) times daily with a meal. 08/26/22   Carlus Pavlov, MD  methocarbamol (ROBAXIN) 500 MG tablet Take 1 tablet (500 mg total) by mouth every 8 (eight) hours as needed for muscle spasms. 01/21/23   Janine Ores K, PA-C  ondansetron (ZOFRAN) 4 MG tablet Take 1 tablet (4 mg total) by mouth every 8 (eight) hours as needed for nausea or vomiting. Patient not taking: Reported on 02/13/2023 01/21/23   Armida Sans, PA-C  oxyCODONE (ROXICODONE) 5 MG immediate release tablet Take 1 tablet (5 mg total) by mouth every 4 (four) hours as needed for severe pain. Patient not taking: Reported on 02/13/2023 01/21/23   Janine Ores K, PA-C  Semaglutide,0.25 or 0.5MG /DOS, (OZEMPIC, 0.25 OR 0.5 MG/DOSE,) 2 MG/3ML SOPN Inject 0.5 mg into the skin once a week. 12/29/22   Carlus Pavlov, MD  sennosides-docusate sodium (SENOKOT-S) 8.6-50 MG tablet Take 2 tablets by mouth daily. 01/21/23   Armida Sans, PA-C      Allergies    Bee venom, Penicillins, Procaine, Isosorbide, and Tape    Review of Systems   See HPI for pertinent positives  Physical Exam Updated Vital Signs BP (!) 152/93 (BP Location: Right Arm)   Pulse 97   Temp 98.3 F (36.8 C) (Oral)   Resp 18  SpO2 97%  Physical Exam Constitutional:      Appearance: Normal appearance.  HENT:     Head: Normocephalic.     Nose: Nose normal.  Eyes:     Conjunctiva/sclera: Conjunctivae normal.  Pulmonary:     Effort: Pulmonary effort is normal.  Musculoskeletal:       Feet:  Neurological:     Mental Status: He is alert.  Psychiatric:        Mood and Affect: Mood normal.     ED Results / Procedures / Treatments   Labs (all labs ordered are listed, but only abnormal results are displayed) Labs Reviewed - No data to display  EKG None  Radiology DG Foot Complete Left  Result Date: 05/24/2023 CLINICAL DATA:  Trauma, stepped on nail with puncture wound, pain and swelling to plantar  surface at base of second and third toes. EXAM: LEFT FOOT - COMPLETE 3+ VIEW COMPARISON:  None Available. FINDINGS: There is no evidence of fracture or dislocation. Mild degenerative changes in the first metatarsophalangeal joint. Moderate calcaneal spurring. No radiopaque foreign body is seen. Soft tissues are unremarkable. IMPRESSION: No radiopaque foreign body. Electronically Signed   By: Thornell Sartorius M.D.   On: 05/24/2023 04:03    Procedures Procedures    Medications Ordered in ED Medications  Tdap (BOOSTRIX) injection 0.5 mL (has no administration in time range)  cephALEXin (KEFLEX) capsule 1,000 mg (has no administration in time range)    ED Course/ Medical Decision Making/ A&P                             Medical Decision Making Amount and/or Complexity of Data Reviewed Radiology: ordered.  Risk Prescription drug management.   62 year old male who is well-appearing presenting for puncture wound from a dirty nail yesterday evening.  Exam notable for small puncture wound about the plantar aspect of the left foot.  X-ray negative for retained foreign body, fracture or dislocation.  At this time the wound does not look infected.  Nevertheless, given history of diabetes, treated empirically for infection with Keflex.  Also administered Tdap booster.  Cleaned wound and applied Ace bandage.  Patient refused crutches stating that he has some at home.  Advised to follow-up with his PCP.  Discussed pertinent return precautions.  Vital stable.  Discharged home.        Final Clinical Impression(s) / ED Diagnoses Final diagnoses:  Injury of left foot, initial encounter    Rx / DC Orders ED Discharge Orders          Ordered    cephALEXin (KEFLEX) 500 MG capsule  2 times daily        05/24/23 0436              Gareth Eagle, PA-C 05/24/23 0442    Tilden Fossa, MD 05/24/23 [phone]

## 2023-05-24 NOTE — ED Triage Notes (Signed)
Pt. Arrives POV for a L foot injury. Pt. Stepped on a nail about 12 hours ago. He states that he cleaned it good but he felt it hit the bone. He states that he hasn't had a tdap shot in over 10 years and he is a diabetic so he wanted to get checked out. There is swelling noted to the L foot. He is able to bear minimal weight on the left foot.

## 2023-05-24 NOTE — Discharge Instructions (Addendum)
Evaluation of your foot revealed that you likely have sustained a puncture wound from a dirty nail.  There is a moderate risk of surgery infection.  For this reason I am starting you on Keflex to treat for infection empirically.  If you have new redness, swelling, the wound becomes hot to touch, worsening pain or any other concerning symptom please return emerged part for further evaluation.  Otherwise recommend you follow-up with your PCP.  He also received her Tdap booster during this encounter.

## 2023-05-24 NOTE — ED Notes (Signed)
Left foot puncture wound cleansed with betadine solution, band aid applied and ace wrap applied.

## 2023-06-09 ENCOUNTER — Telehealth: Payer: Self-pay | Admitting: Cardiovascular Disease

## 2023-06-09 NOTE — Telephone Encounter (Signed)
Returned call to pt, left a detailed message that Carvedilol was sent in 03/2023, 90 days supply with 1 refill.

## 2023-06-09 NOTE — Telephone Encounter (Signed)
*  STAT* If patient is at the pharmacy, call can be transferred to refill team.   1. Which medications need to be refilled? (please list name of each medication and dose if known) carvedilol (COREG) 25 MG tablet  2. Which pharmacy/location (including street and city if local pharmacy) is medication to be sent to?[id number] - [place], [place] - [address] AT [facility] [address]   3. Do they need a 30 day or 90 day supply? 30 days    Pt is out of medication.

## 2023-06-10 MED ORDER — CARVEDILOL 25 MG PO TABS: ORAL_TABLET | ORAL | 1 refills | Status: DC

## 2023-06-10 NOTE — Addendum Note (Signed)
Addended by: Burnetta Sabin on: 06/10/2023 09:43 AM   Modules accepted: Orders

## 2023-06-10 NOTE — Telephone Encounter (Signed)
Pt returned my call. Per pt, pharmacy accidentally cancelled his Carvedilol rx instead of his Clopidogrel rx.  Sent in refill for Carvedilol asking pharmacy to expedite as pt is out.

## 2023-06-16 ENCOUNTER — Other Ambulatory Visit: Payer: Self-pay | Admitting: Adult Health

## 2023-06-23 ENCOUNTER — Encounter: Payer: Self-pay | Admitting: Internal Medicine

## 2023-06-23 ENCOUNTER — Ambulatory Visit (INDEPENDENT_AMBULATORY_CARE_PROVIDER_SITE_OTHER): Payer: 59 | Admitting: Internal Medicine

## 2023-06-23 VITALS — BP 120/70 | HR 93 | Ht 68.0 in | Wt 238.0 lb

## 2023-06-23 DIAGNOSIS — E1165 Type 2 diabetes mellitus with hyperglycemia: Secondary | ICD-10-CM

## 2023-06-23 DIAGNOSIS — Z7984 Long term (current) use of oral hypoglycemic drugs: Secondary | ICD-10-CM

## 2023-06-23 DIAGNOSIS — E785 Hyperlipidemia, unspecified: Secondary | ICD-10-CM

## 2023-06-23 DIAGNOSIS — E1159 Type 2 diabetes mellitus with other circulatory complications: Secondary | ICD-10-CM

## 2023-06-23 DIAGNOSIS — E119 Type 2 diabetes mellitus without complications: Secondary | ICD-10-CM

## 2023-06-23 DIAGNOSIS — Z7985 Long-term (current) use of injectable non-insulin antidiabetic drugs: Secondary | ICD-10-CM

## 2023-06-23 LAB — HEMOGLOBIN A1C: Hemoglobin A1C: 6.9

## 2023-06-23 NOTE — Progress Notes (Signed)
Patient ID: SCIPIO PRESSNALL, male   DOB: [birth year], 62 y.o.   MRN: [id number]  HPI: Jerry Shaffer is a 62 y.o.-year-old male, returning for follow-up for DM2, dx in 2013, non insulin-dependent (on insulin briefly in 2016, and then again in 2021, but off now), uncontrolled, with complications (CAD, TIA, PN). Pt. previously saw Dr. Everardo All, but last visit with me 4 months ago. Insurance: [employer]  Interim history: + increased urination, but no blurry vision, nausea. Last month, he stepped on a nail.  He went to the emergency room, got a tetanus shot.  The wound healed.  Reviewed HbA1c: Lab Results  Component Value Date   HGBA1C 7.5 (H) 01/08/2023   HGBA1C 8.3 (A) 11/04/2022   HGBA1C 8.6 (H) 09/06/2022   HGBA1C 9.0 (A) 08/26/2022   HGBA1C 10.0 (A) 04/18/2022   HGBA1C 7.8 (A) 01/17/2022   HGBA1C 7.3 (A) 10/11/2021   HGBA1C 7.1 (A) 07/12/2021   HGBA1C 6.1 (A) 04/19/2021   HGBA1C 9.4 (A) 01/11/2021   Pt is on a regimen of: - Metformin ER 1000 mg 2x daily >> occas. Loose stools - Glipizide 5 mg before bedtime >> before breakfast >> after b'fast - Ozempic 0.25 >> 0.5 mg weekly He has needle phobia with syringes.  He has no problems injecting insulin with pens. He did not want to try Farxiga due to fear of side effects. He was on Trulicity >> could not obtain it.  He was on [place] >> not affordable.  Pt checks his sugars 2-3x a day and they are: - am: going to bed: 120-200s >> 120-130 >> 89-139 >> (bedtime) <160 - 2h after b'fast: n/c - before lunch: n/c - 2h after lunch: n/c - before dinner: waking up: n/c >> 97, 112--115 - 30-45 min after dinner (PB or ham sandwich): 130-192, 200s >> 130, ave 145, 213 >> 128-150 >> <160, 185 - bedtime: n/c - nighttime: n/c Lowest sugar was 22 (many years ago - on 10 mg Glipizide then) >> 90 >> 80s >> 97; he has hypoglycemia awareness at 70.  Highest sugar was 999 (long time ago)  >> 213 >> 240 (ate late) >> 185.  Glucometer:?  - no CKD, last  BUN/creatinine:  Lab Results  Component Value Date   BUN 15 01/21/2023   BUN 22 (H) 01/08/2023   CREATININE 0.61 01/21/2023   CREATININE 0.90 01/08/2023  He is not on ACE inhibitor/ARB.  - He has HL; last set of lipids: Lab Results  Component Value Date   CHOL 85 09/06/2022   HDL 23 (L) 09/06/2022   LDLCALC 28 09/06/2022   TRIG 169 (H) 09/06/2022   CHOLHDL 3.7 09/06/2022  On Lipitor 80 mg daily.  - last eye exam was "several years ago". No DR reportedly.   - + numbness and tingling in his feet.  Last foot exam 11/04/2022.  He also has a history of bipolar disease, emphysema, migraines. He was in the ED with A-fib with RVR 09/06/2022. He had to have an ablation.  ROS: + see HPI  Past Medical History:  Diagnosis Date   Atrial flutter (HCC)    Bipolar 1 disorder (HCC)    Coronary artery disease    Diabetes mellitus    Dyslipidemia    Emphysema    GERD (gastroesophageal reflux disease)    History of kidney stones    Hypertension    Mental disorder    BIPOLAR DISORDER   Migraine    NSTEMI (non-ST elevated myocardial infarction) (HCC)  09/20/2020   Pneumonia    PONV (postoperative nausea and vomiting)    Renal disorder    Shortness of breath    Stroke (HCC)    Transient ischemic attack (TIA)    Past Surgical History:  Procedure Laterality Date   A-FLUTTER ABLATION N/A 09/08/2022   Procedure: A-FLUTTER ABLATION;  Surgeon: Mealor, Roberts Gaudy, MD;  Location: [facility];  Service: Cardiovascular;  Laterality: N/A;   COLONOSCOPY WITH PROPOFOL N/A 10/17/2021   Procedure: COLONOSCOPY WITH PROPOFOL;  Surgeon: Rachael Fee, MD;  Location: [facility];  Service: Endoscopy;  Laterality: N/A;   CORONARY ANGIOGRAPHY  09/20/2020   CORONARY STENT INTERVENTION  09/20/2020   CORONARY STENT INTERVENTION   CORONARY STENT INTERVENTION N/A 09/20/2020   Procedure: CORONARY STENT INTERVENTION;  Surgeon: Lyn Records, MD;  Location: [facility];  Service:  Cardiovascular;  Laterality: N/A;   CORONARY STENT INTERVENTION N/A 03/28/2021   Procedure: CORONARY STENT INTERVENTION;  Surgeon: Tonny Bollman, MD;  Location: [facility];  Service: Cardiovascular;  Laterality: N/A;   HEMORROIDECTOMY     KNEE ARTHROSCOPY     LEFT HEART CATH AND CORONARY ANGIOGRAPHY N/A 09/20/2020   Procedure: LEFT HEART CATH AND CORONARY ANGIOGRAPHY;  Surgeon: Lyn Records, MD;  Location: [facility];  Service: Cardiovascular;  Laterality: N/A;   LEFT HEART CATH AND CORONARY ANGIOGRAPHY N/A 03/28/2021   Procedure: LEFT HEART CATH AND CORONARY ANGIOGRAPHY;  Surgeon: Tonny Bollman, MD;  Location: [facility] INVASIVE CV [facility];  Service: Cardiovascular;  Laterality: N/A;   LUMBAR DISC SURGERY     POLYPECTOMY  10/17/2021   Procedure: POLYPECTOMY;  Surgeon: Rachael Fee, MD;  Location: [facility];  Service: Endoscopy;;   SHOULDER ARTHROSCOPY WITH DISTAL CLAVICLE RESECTION Left 01/20/2023   Procedure: SHOULDER ARTHROSCOPY WITH DISTAL CLAVICLE EXCISION;  Surgeon: Teryl Lucy, MD;  Location: [facility];  Service: Orthopedics;  Laterality: Left;   SHOULDER ARTHROSCOPY WITH ROTATOR CUFF REPAIR AND SUBACROMIAL DECOMPRESSION Left 01/20/2023   Procedure: SHOULDER ARTHROSCOPY WITH ROTATOR CUFF REPAIR AND SUBACROMIAL DECOMPRESSION;  Surgeon: Teryl Lucy, MD;  Location: [facility];  Service: Orthopedics;  Laterality: Left;   TONSILLECTOMY AND ADENOIDECTOMY     Social History   Socioeconomic History   Marital status: Single    Spouse name: Not on file   Number of children: Not on file   Years of education: Not on file   Highest education level: Not on file  Occupational History   Occupation: Security guard    Employer: [employer]  Tobacco Use   Smoking status: Former    Types: E-cigarettes   Smokeless tobacco: Former    Quit date: 2022  Vaping Use   Vaping Use: Former  Substance and Sexual Activity   Alcohol use: No   Drug use: Not Currently   Sexual activity: Not  Currently  Other Topics Concern   Not on file  Social History Narrative   Not on file   Social Determinants of Health   Financial Resource Strain: Not on file  Food Insecurity: Food Insecurity Present (09/09/2022)   Hunger Vital Sign    Worried About Running Out of Food in the Last Year: Sometimes true    Ran Out of Food in the Last Year: Never true  Transportation Needs: No Transportation Needs (09/06/2022)   PRAPARE - Administrator, Civil Service (Medical): No    Lack of Transportation (Non-Medical): No  Physical Activity: Not on file  Stress:  Not on file  Social Connections: Not on file  Intimate Partner Violence: Not At Risk (09/06/2022)   Humiliation, Afraid, Rape, and Kick questionnaire    Fear of Current or Ex-Partner: No    Emotionally Abused: No    Physically Abused: No    Sexually Abused: No   Current Outpatient Medications on File Prior to Visit  Medication Sig Dispense Refill   albuterol (VENTOLIN HFA) 108 (90 Base) MCG/ACT inhaler Inhale 1-2 puffs into the lungs every 6 (six) hours as needed for wheezing or shortness of breath. (Patient not taking: Reported on 02/13/2023)     apixaban (ELIQUIS) 5 MG TABS tablet Take 1 tablet (5 mg total) by mouth 2 (two) times daily. 60 tablet 5   atorvastatin (LIPITOR) 80 MG tablet TAKE 1 TABLET BY MOUTH EVERY DAY 30 tablet 11   carvedilol (COREG) 25 MG tablet TAKE 1 TABLET(25 MG) BY MOUTH TWICE DAILY WITH A MEAL 180 tablet 1   cephALEXin (KEFLEX) 500 MG capsule Take 2 capsules (1,000 mg total) by mouth 2 (two) times daily. 20 capsule 0   glipiZIDE (GLUCOTROL) 5 MG tablet Take 1 tablet (5 mg total) by mouth daily before breakfast. 90 tablet 3   metFORMIN (GLUCOPHAGE-XR) 500 MG 24 hr tablet Take 4 tablets (2,000 mg total) by mouth daily. (Patient taking differently: Take 1,000 mg by mouth 2 (two) times daily with a meal.) 360 tablet 3   methocarbamol (ROBAXIN) 500 MG tablet Take 1 tablet (500 mg total) by mouth every 8 (eight)  hours as needed for muscle spasms. 30 tablet 0   ondansetron (ZOFRAN) 4 MG tablet Take 1 tablet (4 mg total) by mouth every 8 (eight) hours as needed for nausea or vomiting. (Patient not taking: Reported on 02/13/2023) 10 tablet 0   oxyCODONE (ROXICODONE) 5 MG immediate release tablet Take 1 tablet (5 mg total) by mouth every 4 (four) hours as needed for severe pain. (Patient not taking: Reported on 02/13/2023) 30 tablet 0   Semaglutide,0.25 or 0.5MG /DOS, (OZEMPIC, 0.25 OR 0.5 MG/DOSE,) 2 MG/3ML SOPN Inject 0.5 mg into the skin once a week. 3 mL 5   sennosides-docusate sodium (SENOKOT-S) 8.6-50 MG tablet Take 2 tablets by mouth daily. 30 tablet 1   No current facility-administered medications on file prior to visit.   Allergies  Allergen Reactions   Bee Venom Anaphylaxis   Penicillins Anaphylaxis    Did it involve swelling of the face/tongue/throat, SOB, or low BP? yes Did it involve sudden or severe rash/hives, skin peeling, or any reaction on the inside of your mouth or nose? unknown Did you need to seek medical attention at a hospital or doctor's office? yes When did it last happen?      1967 If all above answers are "NO", may proceed with cephalosporin use.    Procaine Anaphylaxis   Isosorbide Other (See Comments)    Caused migraines   Tape Itching and Other (See Comments)    Certain medical tapes make the patient's skin ITCH   Family History  Problem Relation Age of Onset   Diabetes type II Other    Coronary artery disease Other    Bipolar disorder Other    Lung cancer Father    CAD Mother 37   PE: BP 120/70   Pulse 93   Ht 5\' 8"  (1.727 m)   Wt 238 lb (108 kg)   SpO2 99%   BMI 36.19 kg/m  Wt Readings from Last 3 Encounters:  06/23/23 238 lb (108  kg)  03/05/23 243 lb 12.8 oz (110.6 kg)  02/13/23 244 lb 9.6 oz (110.9 kg)   Constitutional: overweight, in NAD Eyes: no exophthalmos ENT: no thyromegaly, no cervical lymphadenopathy Cardiovascular: RRR, No MRG Respiratory:  CTA B Musculoskeletal: no deformities Skin:  no rashes Neurological: + mild tremor with outstretched hands  ASSESSMENT: 1. DM2, noninsulin-dependent, but off insulin now, uncontrolled, with complications - CAD, s/p NSTEMI - cerebro-vascular disease, s/p TIA - PN  2. HL  3.  Obesity class III  PLAN:  1. Patient with longstanding, uncontrolled, type 2 diabetes, on oral antidiabetic regimen with metformin and sulfonylurea and also weekly GLP-1 receptor agonist.  HbA1c at last visit was better, at 7.5%.  However, he was not able to afford Ozempic so we submitted materials for him to be including in the patient assistance program.  We supplied him with samples as he did not hear from the program.  I did advise him at last visit that if he was not able to obtain Ozempic, to increase the glipizide dose in the morning.  At last visit, sugars were mostly at goal but they were higher in the morning if he had a snack at night.  He was trying to cut these out. -Of note, he has acid reflux with Ozempic, but this is helped by Gaviscon. -At today's visit, he mentions that the paperwork that was sent was incomplete.  He brought the PAP paperwork filled out again and we will try to send it again today.  Sugars appear to be well-controlled otherwise, so we do not need to change his regimen.  However, he is taking glipizide after his last meal of the day and I advised him to move it before. - I suggested to:  Patient Instructions  Please continue: - Metformin ER 1000 mg 2x daily - Glipizide 5 mg before b'fast - Ozempic 0.5 mg weekly in a.m.  Please return in 4 months with your sugar log.   - we checked his HbA1c: 6.9% (lower) - advised to check sugars at different times of the day - 1x a day, rotating check times - advised for yearly eye exams >> he is UTD - return to clinic in 3-4 months  2. HL -Reviewed latest lipid panel from 08/2022: LDL at goal, triglycerides slightly high, HDL low: Lab Results   Component Value Date   CHOL 85 09/06/2022   HDL 23 (L) 09/06/2022   LDLCALC 28 09/06/2022   TRIG 169 (H) 09/06/2022   CHOLHDL 3.7 09/06/2022  -He continues Lipitor 80 mg daily without side effects  3.  Obesity class III -He continues on Ozempic, which should also help with weight loss. -He lost 13 pounds before last visit -He lost 5 more pounds since then  Carlus Pavlov, MD PhD [facility]

## 2023-06-23 NOTE — Patient Instructions (Addendum)
Please continue: - Metformin ER 1000 mg 2x daily - Glipizide 5 mg before b'fast - Ozempic 0.5 mg weekly in a.m.  Please return in 4 months with your sugar log.

## 2023-07-16 ENCOUNTER — Other Ambulatory Visit: Payer: Self-pay | Admitting: Cardiovascular Disease

## 2023-07-16 NOTE — Telephone Encounter (Signed)
Prescription refill request for Eliquis received. Indication: A Flutter Last office visit: 02/13/23  Judie Petit Croitoru MD Scr: 0.61 on 01/21/23 Age: 62 Weight: 110.9kg  Based on above findings Eliquis 5mg  twice daily is the appropriate dose.  Refill approved.

## 2023-08-21 ENCOUNTER — Encounter: Payer: Self-pay | Admitting: Cardiovascular Disease

## 2023-08-21 ENCOUNTER — Other Ambulatory Visit: Payer: Self-pay | Admitting: Cardiovascular Disease

## 2023-08-21 ENCOUNTER — Ambulatory Visit: Payer: 59 | Attending: Cardiovascular Disease | Admitting: Cardiovascular Disease

## 2023-08-21 VITALS — BP 122/74 | HR 75 | Ht 68.0 in | Wt 242.2 lb

## 2023-08-21 DIAGNOSIS — I25119 Atherosclerotic heart disease of native coronary artery with unspecified angina pectoris: Secondary | ICD-10-CM | POA: Diagnosis not present

## 2023-08-21 DIAGNOSIS — I209 Angina pectoris, unspecified: Secondary | ICD-10-CM

## 2023-08-21 DIAGNOSIS — I700 Atherosclerosis of aorta: Secondary | ICD-10-CM

## 2023-08-21 DIAGNOSIS — I10 Essential (primary) hypertension: Secondary | ICD-10-CM

## 2023-08-21 DIAGNOSIS — Z794 Long term (current) use of insulin: Secondary | ICD-10-CM

## 2023-08-21 DIAGNOSIS — Z8679 Personal history of other diseases of the circulatory system: Secondary | ICD-10-CM

## 2023-08-21 DIAGNOSIS — E1159 Type 2 diabetes mellitus with other circulatory complications: Secondary | ICD-10-CM

## 2023-08-21 DIAGNOSIS — E785 Hyperlipidemia, unspecified: Secondary | ICD-10-CM

## 2023-08-21 MED ORDER — ALBUTEROL SULFATE HFA 108 (90 BASE) MCG/ACT IN AERS: 1.0000 | INHALATION_SPRAY | Freq: Four times a day (QID) | RESPIRATORY_TRACT | 6 refills | Status: DC | PRN

## 2023-08-21 MED ORDER — NITROGLYCERIN 0.4 MG SL SUBL: 0.4000 mg | SUBLINGUAL_TABLET | SUBLINGUAL | 3 refills | Status: DC | PRN

## 2023-08-21 MED ORDER — METOPROLOL TARTRATE 100 MG PO TABS: 100.0000 mg | ORAL_TABLET | Freq: Two times a day (BID) | ORAL | 3 refills | Status: DC

## 2023-08-21 MED ORDER — ASPIRIN 81 MG PO TBEC: 81.0000 mg | DELAYED_RELEASE_TABLET | Freq: Every day | ORAL | Status: DC

## 2023-08-21 NOTE — Telephone Encounter (Signed)
Pt is requesting a refill on his albuterol inhaler. Would Dr. Royann Shivers like to refill this non cardiac medication? Please address

## 2023-08-21 NOTE — Progress Notes (Signed)
Cardiology Office Note:    Date:  08/21/2023   ID:  Jerry Shaffer, DOB 1961/08/19, MRN [id number]  PCP:  Patient, No Pcp Per  [facility] Cardiologist:  Thurmon Fair, MD  [facility] Electrophysiologist:  Maurice Small, MD   Referring MD: No ref. provider found   No chief complaint on file.   History of Present Illness:    Jerry Shaffer is a 62 y.o. male with a hx of rcoronary artery disease presenting with inferior NSTEMI on September 20, 2020 due to occlusion of the mid PDA emergency PCI-drug-eluting stent (18 x 2.75 mm Onyx), subsequently presented with unstable angina and underwent PCI-drug-eluting stent (3.5 x 20 mm Synergy) to the proximal LAD on March 28, 2021, with residual moderate stenosis in the mid LAD and mild stenosis in the diagonal artery and 50% mid circumflex stenosis;  also known to have aortic atherosclerosis,  migraine headaches, dyslipidemia (low HDL), type 2 diabetes mellitus, adult ADHD, chronic smoker.  He underwent successful ablation for typical atrial flutter by Dr. Nelly Laurence in September 2023 and has not had any sustained arrhythmia recurrence since then.   He is having increasing frequent episodes of retrosternal and left parasternal discomfort that are improved with sublingual nitroglycerin.  This happens on the average twice a week and he does not require more than 1 nitroglycerin at a time.  Symptoms are not necessarily brought on by exertion.  He was intolerant of long-acting nitrates due to migraines.  It has been just under 18 months since his second stent procedure.  He has not had any problems with palpitations or recurrent tachycardia since his atrial flutter ablation a year ago.  He does have episodes of wheezing and finds that the albuterol does not work as promptly as it did in the past, ever since he started carvedilol.  His left shoulder is much better since surgery.  He is still undergoing physical therapy.  He denies leg edema,  claudication, orthopnea, PND, palpitations, dizziness, syncope.  He had a low risk nuclear stress test in October 2023 (old inferior and inferolateral scar, no ischemia, EF 53%).  EF was normal at 60-65% by echo in September 2023, mild LVH, no serious valvular abnormalities.  Metabolic control is fair.  His most recent hemoglobin A1c was 7.5% (a slight improvement).  His LDL is excellent at 28 but has a chronically low HDL of 23.  He has normal renal function parameters.    Past Medical History:  Diagnosis Date   Atrial flutter (HCC)    Bipolar 1 disorder (HCC)    Coronary artery disease    Diabetes mellitus    Dyslipidemia    Emphysema    GERD (gastroesophageal reflux disease)    History of kidney stones    Hypertension    Mental disorder    BIPOLAR DISORDER   Migraine    NSTEMI (non-ST elevated myocardial infarction) (HCC) 09/20/2020   Pneumonia    PONV (postoperative nausea and vomiting)    Renal disorder    Shortness of breath    Stroke (HCC)    Transient ischemic attack (TIA)     Past Surgical History:  Procedure Laterality Date   A-FLUTTER ABLATION N/A 09/08/2022   Procedure: A-FLUTTER ABLATION;  Surgeon: Mealor, Roberts Gaudy, MD;  [facility]: [facility];  Service: Cardiovascular;  Laterality: N/A;   COLONOSCOPY WITH PROPOFOL N/A 10/17/2021   Procedure: COLONOSCOPY WITH PROPOFOL;  Surgeon: Rachael Fee, MD;  Location: [facility] ENDOS[facility];  Service: Endoscopy;  Laterality: N/A;   CORONARY ANGIOGRAPHY  09/20/2020   CORONARY STENT INTERVENTION  09/20/2020   CORONARY STENT INTERVENTION   CORONARY STENT INTERVENTION N/A 09/20/2020   Procedure: CORONARY STENT INTERVENTION;  Surgeon: Lyn Records, MD;  Location: [facility];  Service: Cardiovascular;  Laterality: N/A;   CORONARY STENT INTERVENTION N/A 03/28/2021   Procedure: CORONARY STENT INTERVENTION;  Surgeon: Tonny Bollman, MD;  Location: [facility] INVASIVE CV LAB;  Service: Cardiovascular;  Laterality: N/A;    HEMORROIDECTOMY     KNEE ARTHROSCOPY     LEFT HEART CATH AND CORONARY ANGIOGRAPHY N/A 09/20/2020   Procedure: LEFT HEART CATH AND CORONARY ANGIOGRAPHY;  Surgeon: Lyn Records, MD;  Location: [facility];  Service: Cardiovascular;  Laterality: N/A;   LEFT HEART CATH AND CORONARY ANGIOGRAPHY N/A 03/28/2021   Procedure: LEFT HEART CATH AND CORONARY ANGIOGRAPHY;  Surgeon: Tonny Bollman, MD;  Location: [facility] INVASIVE CV LAB;  Service: Cardiovascular;  Laterality: N/A;   LUMBAR DISC SURGERY     POLYPECTOMY  10/17/2021   Procedure: POLYPECTOMY;  Surgeon: Rachael Fee, MD;  Location: [facility];  Service: Endoscopy;;   SHOULDER ARTHROSCOPY WITH DISTAL CLAVICLE RESECTION Left 01/20/2023   Procedure: SHOULDER ARTHROSCOPY WITH DISTAL CLAVICLE EXCISION;  Surgeon: Teryl Lucy, MD;  Location: [facility];  Service: Orthopedics;  Laterality: Left;   SHOULDER ARTHROSCOPY WITH ROTATOR CUFF REPAIR AND SUBACROMIAL DECOMPRESSION Left 01/20/2023   Procedure: SHOULDER ARTHROSCOPY WITH ROTATOR CUFF REPAIR AND SUBACROMIAL DECOMPRESSION;  Surgeon: Teryl Lucy, MD;  Location: [facility];  Service: Orthopedics;  Laterality: Left;   TONSILLECTOMY AND ADENOIDECTOMY      Current Medications: Current Meds  Medication Sig   albuterol (VENTOLIN HFA) 108 (90 Base) MCG/ACT inhaler Inhale 1-2 puffs into the lungs every 6 (six) hours as needed for wheezing or shortness of breath.   aspirin EC 81 MG tablet Take 1 tablet (81 mg total) by mouth daily. Swallow whole.   atorvastatin (LIPITOR) 80 MG tablet TAKE 1 TABLET BY MOUTH EVERY DAY   glipiZIDE (GLUCOTROL) 5 MG tablet Take 1 tablet (5 mg total) by mouth daily before breakfast.   metFORMIN (GLUCOPHAGE-XR) 500 MG 24 hr tablet Take 4 tablets (2,000 mg total) by mouth daily. (Patient taking differently: Take 1,000 mg by mouth 2 (two) times daily with a meal.)   methocarbamol (ROBAXIN) 500 MG tablet Take 1 tablet (500 mg total) by mouth every 8 (eight) hours as needed for muscle  spasms.   metoprolol tartrate (LOPRESSOR) 100 MG tablet Take 1 tablet (100 mg total) by mouth 2 (two) times daily.   nitroGLYCERIN (NITROSTAT) 0.4 MG SL tablet Place 1 tablet (0.4 mg total) under the tongue every 5 (five) minutes as needed for chest pain.   oxyCODONE (ROXICODONE) 5 MG immediate release tablet Take 1 tablet (5 mg total) by mouth every 4 (four) hours as needed for severe pain.   [DISCONTINUED] ELIQUIS 5 MG TABS tablet TAKE 1 TABLET(5 MG) BY MOUTH TWICE DAILY     Allergies:   Bee venom, Penicillins, Procaine, Isosorbide, and Tape   Social History   Socioeconomic History   Marital status: Single    Spouse name: Not on file   Number of children: Not on file   Years of education: Not on file   Highest education level: Not on file  Occupational History   Occupation: Security guard    Employer: [employer]  Tobacco Use   Smoking status: Former    Types: E-cigarettes   Smokeless  tobacco: Former    Quit date: 2022  Vaping Use   Vaping status: Former  Substance and Sexual Activity   Alcohol use: No   Drug use: Not Currently   Sexual activity: Not Currently  Other Topics Concern   Not on file  Social History Narrative   Not on file   Social Determinants of Health   Financial Resource Strain: Not on file  Food Insecurity: Food Insecurity Present (09/09/2022)   Hunger Vital Sign    Worried About Running Out of Food in the Last Year: Sometimes true    Ran Out of Food in the Last Year: Never true  Transportation Needs: No Transportation Needs (09/06/2022)   PRAPARE - Administrator, Civil Service (Medical): No    Lack of Transportation (Non-Medical): No  Physical Activity: Not on file  Stress: Not on file  Social Connections: Not on file     Family History: The patient's family history includes Bipolar disorder in an other family member; CAD (age of onset: 12) in his mother; Coronary artery disease in an other family member; Diabetes type II in an  other family member; Lung cancer in his father.  ROS:   Please see the history of present illness.     All other systems reviewed and are negative.  EKGs/Labs/Other Studies Reviewed:    The following studies were reviewed today:  Lexiscan Myoview 10/03/2022    Findings are consistent with prior myocardial infarction. The study is overall low risk given no significant change in LV function or perfusion defects since last exam.   No ST deviation was noted.   LV perfusion is abnormal. There is no evidence of ischemia. There is evidence of infarction. Defect 1: There is a medium defect with moderate reduction in uptake present in the apical to basal inferior and inferolateral location(s) that is fixed. There is abnormal wall motion in the defect area. Consistent with infarction.   Left ventricular function is grossly normal. Nuclear stress EF: 53 %. Visually appears 55-60% End diastolic cavity size is moderately enlarged. End systolic cavity size is mildly enlarged. Evidence of transient ischemic dilation (TID) noted, 1.22. TID was appreciated quantitatively but not visually.   Prior study available for comparison from 03/08/2021. No changes compared to prior study.    ECHO 09/06/2022   1. Left ventricular ejection fraction, by estimation, is 60 to 65%. The  left ventricle has normal function. The left ventricle has no regional  wall motion abnormalities. There is mild left ventricular hypertrophy.  Left ventricular diastolic parameters  are indeterminate.   2. Right ventricular systolic function is normal. The right ventricular  size is normal.   3. Left atrial size was mildly dilated.   4. The mitral valve is normal in structure. No evidence of mitral valve  regurgitation. No evidence of mitral stenosis.   5. The aortic valve was not well visualized. Aortic valve regurgitation  is not visualized. No aortic stenosis is present.   6. The inferior vena cava is normal in size with greater  than 50%  respiratory variability, suggesting right atrial pressure of 3 mmHg.   Cath 09/20/2020 Total occlusion of the mid PDA (a large vessel that extends to the left ventricular apex), filling sluggishly by apical LAD to PDA collaterals.  This vessel is felt to represent the culprit for the patient's presentation. Total occlusion of the mid PDA reduced to 0% using an 18 x 2.75 mm Onyx deployed at 12 atm x 2 inflations.  TIMI grade III flow was noted.  Resolution of chest pain occurred.  Proximal to the stented segment there is a 30 to 40% stenosis in the PDA. Left main is widely patent LAD contains mid vessel disease with a Medina 011 bifurcation stenosis.  LAD is 70% and diagonal is 40%.  This can be managed medically. Circumflex contains segmental 40 to 50% stenosis in the large second obtuse marginal. RCA is dominant, tortuous, and no significant disease other than that noted above in the PDA. Mid inferior wall to apical akinesis.  LVEDP 6 mmHg.  EF 45 to 50%.     RECOMMENDATIONS:   Aspirin and Plavix for 1 year. IV nitroglycerin discontinued. Check hemoglobin A1c. Aggressive statin therapy for risk reduction. Smoking cessation Potential discharge in 24 to 36 hours depending upon clinical course.  Cardiac catheterization 03/28/2021.  1.  Patent left main 2.  Severe stenosis of the proximal LAD, new from cardiac catheterization 6 months ago, treated successfully with PCI using a 3.5 x 20 mm Synergy DES.  The mid LAD is unchanged from the prior study with Medina 0, 1, 1 bifurcation disease with only moderate stenosis in the LAD and mild stenosis in the diagonal 3.  Moderate 50% mid circumflex stenosis 4.  Continued patency of the right PDA stent with mild diffuse nonobstructive PDA and RCA stenosis 5.  Normal LVEDP   Recommendations: Continue dual antiplatelet therapy with aspirin and clopidogrel at least 6 months without interruption.  Should be eligible for hospital discharge  tomorrow.    Dominance: Right   Coronary Diagrams   Diagnostic Dominance: Right    Intervention     Implants     Permanent Stent   Synergy Xd 3.50x20 - [id number] - Implanted     EKG: EKG is ordered today and shows normal sinus rhythm, normal tracing. Recent Labs: 09/05/2022: ALT 22; B Natriuretic Peptide 101.3 09/06/2022: TSH 0.853 09/09/2022: Magnesium 1.8 01/21/2023: BUN 15; Creatinine, Ser 0.61; Hemoglobin 11.9; Platelets 227; Potassium 3.8; Sodium 138  Recent Lipid Panel    Component Value Date/Time   CHOL 85 09/06/2022 0226   CHOL 87 (L) 08/15/2021 0839   TRIG 169 (H) 09/06/2022 0226   HDL 23 (L) 09/06/2022 0226   HDL 26 (L) 08/15/2021 0839   CHOLHDL 3.7 09/06/2022 0226   VLDL 34 09/06/2022 0226   LDLCALC 28 09/06/2022 0226   LDLCALC 45 08/15/2021 0839     Risk Assessment/Calculations:       Physical Exam:    VS:  BP 122/74 (BP Location: Left Arm, Patient Position: Sitting, Cuff Size: Large)   Pulse 75   Ht 5\' 8"  (1.727 m)   Wt 242 lb 3.2 oz (109.9 kg)   SpO2 97%   BMI 36.83 kg/m     Wt Readings from Last 3 Encounters:  08/21/23 242 lb 3.2 oz (109.9 kg)  06/23/23 238 lb (108 kg)  03/05/23 243 lb 12.8 oz (110.6 kg)    General: Alert, oriented x3, no distress, severely obese Head: no evidence of trauma, PERRL, EOMI, no exophtalmos or lid lag, no myxedema, no xanthelasma; normal ears, nose and oropharynx Neck: normal jugular venous pulsations and no hepatojugular reflux; brisk carotid pulses without delay and no carotid bruits Chest: clear to auscultation, no signs of consolidation by percussion or palpation, normal fremitus, symmetrical and full respiratory excursions Cardiovascular: normal position and quality of the apical impulse, regular rhythm, normal first and second heart sounds, no murmurs, rubs or gallops Abdomen: no tenderness or distention,  no masses by palpation, no abnormal pulsatility or arterial bruits, normal bowel sounds, no  hepatosplenomegaly Extremities: no clubbing, cyanosis or edema; 2+ radial, ulnar and brachial pulses bilaterally; 2+ right femoral, posterior tibial and dorsalis pedis pulses; 2+ left femoral, posterior tibial and dorsalis pedis pulses; no subclavian or femoral bruits Neurological: grossly nonfocal Psych: Normal mood and affect      ASSESSMENT:    1. Coronary artery disease involving native coronary artery of native heart with angina pectoris (HCC)   2. Hypertension, unspecified type   3. Angina pectoris (HCC)   4. Dyslipidemia, goal LDL below 70   5. Type 2 diabetes mellitus with other circulatory complication, with long-term current use of insulin (HCC)   6. Atherosclerosis of aorta (HCC)   7. History of atrial flutter       PLAN:    In order of problems listed above:  CAD: He is having nitroglycerin responsive chest discomfort, roughly 18 months following his stent procedure.  It is possible that he has in-stent restenosis (he does have diabetes mellitus) or that there has been progression of the moderate LAD stenosis downstream of the previously placed stent.  A year ago he had a low risk nuclear SPEC test, we will schedule him for a PET scan..  Had non-STEMI and underwent PCI-DES to PDA in October 2021 with subsequent unstable angina for which he underwent placement of a DES to the proximal LAD, with moderate residual disease in the left circumflex, mid LAD and first diagonal.  Stop Eliquis and restart aspirin 81 mg daily.  On appropriate lipid-lowering therapy, but has chronically low HDL. HLP: Excellent LDL on statin, but very low HDL chronically.  Recheck labs today. History of smoking/probable COPD: No longer smokes or vapes, but continues to have cough and wheezing.  Switch from carvedilol to beta-1 selective metoprolol. DM: Fair, but imperfect glycemic control.  Target hemoglobin A1c less than 7%.  He has a follow-up appointment with endocrinology in November. Aortic  atherosclerosis: Noted incidentally on imaging studies.  No evidence of aneurysmal change. Obesity: Unchanged BMI around 37. Atrial flutter status post ablation: Clinically has had an excellent response to atrial flutter ablation, 11 months following his procedure.  I think we need to restart antiplatelet therapy.  Stop the Eliquis. Shoulder injury: Status post recent left shoulder surgery.  Improved pain and mobility.  Shared Decision Making/Informed Consent      This procedure has been fully reviewed with the patient and written informed consent has been obtained.    Medication Adjustments/Labs and Tests Ordered: Current medicines are reviewed at length with the patient today.  Concerns regarding medicines are outlined above.  Orders Placed This Encounter  Procedures   CBC   Basic metabolic panel   Hemoglobin A1c   Lipid panel   EKG 12-Lead    Meds ordered this encounter  Medications   nitroGLYCERIN (NITROSTAT) 0.4 MG SL tablet    Sig: Place 1 tablet (0.4 mg total) under the tongue every 5 (five) minutes as needed for chest pain.    Dispense:  25 tablet    Refill:  3   metoprolol tartrate (LOPRESSOR) 100 MG tablet    Sig: Take 1 tablet (100 mg total) by mouth 2 (two) times daily.    Dispense:  180 tablet    Refill:  3   aspirin EC 81 MG tablet    Sig: Take 1 tablet (81 mg total) by mouth daily. Swallow whole.     Patient Instructions  Medication Instructions:  STOP Carvedilol STOP Eliquis START- Metoprolol Tartrate 100 mg twice a day START- Aspirin 81 mg daily *If you need a refill on your cardiac medications before your next appointment, please call your pharmacy*  Labs: Lipid panel today  Testing/Procedures: How to Prepare for Your Cardiac PET/CT Stress Test:  1. Please do not take these medications before your test:   Medications that may interfere with the cardiac pharmacological stress agent (ex. nitrates - including erectile dysfunction medications,  isosorbide mononitrate, tamulosin or beta-blockers) the day of the exam. (Erectile dysfunction medication should be held for at least 72 hrs prior to test) Theophylline containing medications for 12 hours. Dipyridamole 48 hours prior to the test. Bring your inhaler with you Your remaining medications may be taken with water.  2. Nothing to eat or drink, except water, 3 hours prior to arrival time.   NO caffeine/decaffeinated products, or chocolate 12 hours prior to arrival.  3. NO perfume, cologne or lotion on chest or abdomen area.         4. Total time is 1 to 2 hours; you may want to bring reading material for the waiting time.  5. Please report to Radiology at the [facility] Main Entrance 30 minutes early for your test.  [address] [place], [place] [address]  6. Please report to Radiology at [facility] Main Entrance, medical mall, 30 mins prior to your test.  [address]  [place], [place]  [phone]  Diabetic Preparation:  Hold oral medications. You may take NPH and Lantus insulin. Do not take Humalog or Humulin R (Regular Insulin) the day of your test. Check blood sugars prior to leaving the house. If able to eat breakfast prior to 3 hour fasting, you may take all medications, including your insulin, Do not worry if you miss your breakfast dose of insulin - start at your next meal. Patients who wear a continuous glucose monitor MUST remove the device prior to scanning.  IF YOU THINK YOU MAY BE PREGNANT, OR ARE NURSING PLEASE INFORM THE TECHNOLOGIST.  In preparation for your appointment, medication and supplies will be purchased.  Appointment availability is limited, so if you need to cancel or reschedule, please call the Radiology Department at [phone] [facility]) OR [phone] [facility])  24 hours in advance to avoid a cancellation fee of $100.00  What to Expect After you Arrive:  Once you arrive and check in for your  appointment, you will be taken to a preparation room within the Radiology Department.  A technologist or Nurse will obtain your medical history, verify that you are correctly prepped for the exam, and explain the procedure.  Afterwards,  an IV will be started in your arm and electrodes will be placed on your skin for EKG monitoring during the stress portion of the exam. Then you will be escorted to the PET/CT scanner.  There, staff will get you positioned on the scanner and obtain a blood pressure and EKG.  During the exam, you will continue to be connected to the EKG and blood pressure machines.  A small, safe amount of a radioactive tracer will be injected in your IV to obtain a series of pictures of your heart along with an injection of a stress agent.    After your Exam:  It is recommended that you eat a meal and drink a caffeinated beverage to counter act any effects of the stress agent.  Drink plenty of fluids for  the remainder of the day and urinate frequently for the first couple of hours after the exam.  Your doctor will inform you of your test results within 7-10 business days.  For more information and frequently asked questions, please visit our website : [email or web address]  For questions about your test or how to prepare for your test, please call: Cardiac Imaging Nurse Navigators Office: [phone]    Follow-Up: At [facility], you and your health needs are our priority.  As part of our continuing mission to provide you with exceptional heart care, we have created designated Provider Care Teams.  These Care Teams include your primary Cardiologist (physician) and Advanced Practice Providers (APPs -  Physician Assistants and Nurse Practitioners) who all work together to provide you with the care you need, when you need it.  We recommend signing up for the patient portal called "MyChart".  Sign up information is provided on this After Visit Summary.  MyChart is  used to connect with patients for Virtual Visits (Telemedicine).  Patients are able to view lab/test results, encounter notes, upcoming appointments, etc.  Non-urgent messages can be sent to your provider as well.   To learn more about what you can do with MyChart, go to [email or web address].    Your next appointment:   APP- 3 months  Dr Royann Shivers 1 year    Signed, Thurmon Fair, MD  08/21/2023 9:32 AM    [facility]

## 2023-08-21 NOTE — Telephone Encounter (Signed)
Please list the quantity and refills that Dr. Royann Shivers would like to reorder. Thanks

## 2023-08-21 NOTE — Patient Instructions (Addendum)
Medication Instructions:  STOP Carvedilol STOP Eliquis START- Metoprolol Tartrate 100 mg twice a day START- Aspirin 81 mg daily *If you need a refill on your cardiac medications before your next appointment, please call your pharmacy*  Labs: Lipid panel today  Testing/Procedures: How to Prepare for Your Cardiac PET/CT Stress Test:  1. Please do not take these medications before your test:   Medications that may interfere with the cardiac pharmacological stress agent (ex. nitrates - including erectile dysfunction medications, isosorbide mononitrate, tamulosin or beta-blockers) the day of the exam. (Erectile dysfunction medication should be held for at least 72 hrs prior to test) Theophylline containing medications for 12 hours. Dipyridamole 48 hours prior to the test. Bring your inhaler with you Your remaining medications may be taken with water.  2. Nothing to eat or drink, except water, 3 hours prior to arrival time.   NO caffeine/decaffeinated products, or chocolate 12 hours prior to arrival.  3. NO perfume, cologne or lotion on chest or abdomen area.         4. Total time is 1 to 2 hours; you may want to bring reading material for the waiting time.  5. Please report to Radiology at the [facility] Main Entrance 30 minutes early for your test.  [address] [place], [place] [address]  6. Please report to Radiology at [facility] Main Entrance, medical mall, 30 mins prior to your test.  [address]  [place], [place]  [phone]  Diabetic Preparation:  Hold oral medications. You may take NPH and Lantus insulin. Do not take Humalog or Humulin R (Regular Insulin) the day of your test. Check blood sugars prior to leaving the house. If able to eat breakfast prior to 3 hour fasting, you may take all medications, including your insulin, Do not worry if you miss your breakfast dose of insulin - start at your next meal. Patients who  wear a continuous glucose monitor MUST remove the device prior to scanning.  IF YOU THINK YOU MAY BE PREGNANT, OR ARE NURSING PLEASE INFORM THE TECHNOLOGIST.  In preparation for your appointment, medication and supplies will be purchased.  Appointment availability is limited, so if you need to cancel or reschedule, please call the Radiology Department at [phone] [facility]) OR [phone] [facility])  24 hours in advance to avoid a cancellation fee of $100.00  What to Expect After you Arrive:  Once you arrive and check in for your appointment, you will be taken to a preparation room within the Radiology Department.  A technologist or Nurse will obtain your medical history, verify that you are correctly prepped for the exam, and explain the procedure.  Afterwards,  an IV will be started in your arm and electrodes will be placed on your skin for EKG monitoring during the stress portion of the exam. Then you will be escorted to the PET/CT scanner.  There, staff will get you positioned on the scanner and obtain a blood pressure and EKG.  During the exam, you will continue to be connected to the EKG and blood pressure machines.  A small, safe amount of a radioactive tracer will be injected in your IV to obtain a series of pictures of your heart along with an injection of a stress agent.    After your Exam:  It is recommended that you eat a meal and drink a caffeinated beverage to counter act any effects of the stress agent.  Drink plenty of fluids for the  remainder of the day and urinate frequently for the first couple of hours after the exam.  Your doctor will inform you of your test results within 7-10 business days.  For more information and frequently asked questions, please visit our website : [email or web address]  For questions about your test or how to prepare for your test, please call: Cardiac Imaging Nurse Navigators Office: [phone]    Follow-Up: At [facility], you and your health needs are our priority.  As part of our continuing mission to provide you with exceptional heart care, we have created designated Provider Care Teams.  These Care Teams include your primary Cardiologist (physician) and Advanced Practice Providers (APPs -  Physician Assistants and Nurse Practitioners) who all work together to provide you with the care you need, when you need it.  We recommend signing up for the patient portal called "MyChart".  Sign up information is provided on this After Visit Summary.  MyChart is used to connect with patients for Virtual Visits (Telemedicine).  Patients are able to view lab/test results, encounter notes, upcoming appointments, etc.  Non-urgent messages can be sent to your provider as well.   To learn more about what you can do with MyChart, go to [email or web address].    Your next appointment:   APP- 3 months  Dr Royann Shivers 1 year

## 2023-08-21 NOTE — Addendum Note (Signed)
Addended by: Thurmon Fair on: 08/21/2023 10:17 AM   Modules accepted: Orders

## 2023-08-21 NOTE — Addendum Note (Signed)
Addended by: Scheryl Marten on: 08/21/2023 09:34 AM   Modules accepted: Orders

## 2023-08-21 NOTE — Telephone Encounter (Signed)
*  STAT* If patient is at the pharmacy, call can be transferred to refill team.   1. Which medications need to be refilled? (please list name of each medication and dose if known)   albuterol (VENTOLIN HFA) 108 (90 Base) MCG/ACT inhaler     2. Would you like to learn more about the convenience, safety, & potential cost savings by using the [facility]? No   3. Are you open to using the [facility] (Type [facility]. ) No   4. Which pharmacy/location (including street and city if local pharmacy) is medication to be sent to? [id number] - [place], [place] - [address] AT [facility] OF [address]     5. Do they need a 30 day or 90 day supply? 30 day

## 2023-08-22 LAB — CBC
Hematocrit: 40.1 % (ref 37.5–51.0)
Hemoglobin: 13.1 g/dL (ref 13.0–17.7)
MCH: 27.9 pg (ref 26.6–33.0)
MCHC: 32.7 g/dL (ref 31.5–35.7)
MCV: 85 fL (ref 79–97)
Platelets: 234 10*3/uL (ref 150–450)
RBC: 4.7 x10E6/uL (ref 4.14–5.80)
RDW: 13.5 % (ref 11.6–15.4)
WBC: 6.8 10*3/uL (ref 3.4–10.8)

## 2023-08-22 LAB — BASIC METABOLIC PANEL
BUN/Creatinine Ratio: 24 (ref 10–24)
BUN: 17 mg/dL (ref 8–27)
CO2: 21 mmol/L (ref 20–29)
Calcium: 8.8 mg/dL (ref 8.6–10.2)
Chloride: 107 mmol/L — ABNORMAL HIGH (ref 96–106)
Creatinine, Ser: 0.72 mg/dL — ABNORMAL LOW (ref 0.76–1.27)
Glucose: 203 mg/dL — ABNORMAL HIGH (ref 70–99)
Potassium: 4.4 mmol/L (ref 3.5–5.2)
Sodium: 140 mmol/L (ref 134–144)
eGFR: 105 mL/min/{1.73_m2} (ref >59)

## 2023-08-22 LAB — LIPID PANEL
Chol/HDL Ratio: 3 ratio (ref 0.0–5.0)
Cholesterol, Total: 97 mg/dL — ABNORMAL LOW (ref 100–199)
HDL: 32 mg/dL — ABNORMAL LOW (ref >39)
LDL Chol Calc (NIH): 51 mg/dL (ref 0–99)
Triglycerides: 65 mg/dL (ref 0–149)
VLDL Cholesterol Cal: 14 mg/dL (ref 5–40)

## 2023-08-22 LAB — HEMOGLOBIN A1C
Est. average glucose Bld gHb Est-mCnc: 171 mg/dL
Hgb A1c MFr Bld: 7.6 % — ABNORMAL HIGH (ref 4.8–5.6)

## 2023-08-24 ENCOUNTER — Encounter: Payer: Self-pay | Admitting: Emergency Medicine

## 2023-08-24 ENCOUNTER — Telehealth: Payer: Self-pay | Admitting: Emergency Medicine

## 2023-08-24 NOTE — Telephone Encounter (Signed)
Called to inform the patient of recent lab results. Left call back number. Will also mail results to patient. Croitoru, Mihai, MD  Routine labs are OK. Glucose control is fair, if not great with A1c 7.6% (ideally 6-7%). Excellent LDL cholesterol. HDL a little better than last year but still quite low (improves with exercise and weight loss). Normal bood counts.

## 2023-09-26 ENCOUNTER — Other Ambulatory Visit: Payer: Self-pay | Admitting: Internal Medicine

## 2023-09-26 DIAGNOSIS — E1165 Type 2 diabetes mellitus with hyperglycemia: Secondary | ICD-10-CM

## 2023-09-28 ENCOUNTER — Other Ambulatory Visit: Payer: Self-pay

## 2023-09-28 DIAGNOSIS — E1159 Type 2 diabetes mellitus with other circulatory complications: Secondary | ICD-10-CM

## 2023-09-28 MED ORDER — METFORMIN HCL ER 500 MG PO TB24: 2000.0000 mg | ORAL_TABLET | Freq: Every day | ORAL | 3 refills | Status: DC

## 2023-10-08 ENCOUNTER — Telehealth: Payer: Self-pay | Admitting: Emergency Medicine

## 2023-10-08 DIAGNOSIS — R079 Chest pain, unspecified: Secondary | ICD-10-CM

## 2023-10-08 DIAGNOSIS — I25119 Atherosclerotic heart disease of native coronary artery with unspecified angina pectoris: Secondary | ICD-10-CM

## 2023-10-08 NOTE — Telephone Encounter (Signed)
Left message and call back number.  Order placed for PET- instructions received at visit 08/21/23. Scheduling will reach out to get appointment scheduled.

## 2023-10-09 NOTE — Telephone Encounter (Signed)
Patient called to get NM PET test scheduled and that office noted patient will need to get insurance authorization for this test.  Patient stated he works third shift and can leave a message on his voice mail when he can scheduled the test.

## 2023-10-27 ENCOUNTER — Ambulatory Visit (INDEPENDENT_AMBULATORY_CARE_PROVIDER_SITE_OTHER): Payer: 59 | Admitting: Internal Medicine

## 2023-10-27 ENCOUNTER — Telehealth: Payer: Self-pay

## 2023-10-27 ENCOUNTER — Encounter: Payer: Self-pay | Admitting: Internal Medicine

## 2023-10-27 VITALS — BP 138/86 | HR 67 | Ht 68.0 in | Wt 249.0 lb

## 2023-10-27 DIAGNOSIS — Z7985 Long-term (current) use of injectable non-insulin antidiabetic drugs: Secondary | ICD-10-CM | POA: Diagnosis not present

## 2023-10-27 DIAGNOSIS — E785 Hyperlipidemia, unspecified: Secondary | ICD-10-CM | POA: Diagnosis not present

## 2023-10-27 DIAGNOSIS — E1159 Type 2 diabetes mellitus with other circulatory complications: Secondary | ICD-10-CM

## 2023-10-27 DIAGNOSIS — E1165 Type 2 diabetes mellitus with hyperglycemia: Secondary | ICD-10-CM

## 2023-10-27 LAB — POCT GLYCOSYLATED HEMOGLOBIN (HGB A1C): Hemoglobin A1C: 8.5 % — AB (ref 4.0–5.6)

## 2023-10-27 LAB — MICROALBUMIN / CREATININE URINE RATIO
Creatinine,U: 82.4 mg/dL
Microalb Creat Ratio: 0.8 mg/g (ref 0.0–30.0)
Microalb, Ur: <0.7 mg/dL (ref 0.0–1.9)

## 2023-10-27 LAB — GLUCOSE, POCT (MANUAL RESULT ENTRY): POC Glucose: 199 mg/dL — AB (ref 70–99)

## 2023-10-27 MED ORDER — GLIPIZIDE 5 MG PO TABS: 5.0000 mg | ORAL_TABLET | Freq: Two times a day (BID) | ORAL | 3 refills | Status: DC

## 2023-10-27 NOTE — Progress Notes (Signed)
Patient ID: Jerry Shaffer, male   DOB: [birth year], 62 y.o.   MRN: [id number]  HPI: Jerry Shaffer is a 62 y.o.-year-old male, returning for follow-up for DM2, dx in 2013, non insulin-dependent (on insulin briefly in 2016, and then again in 2021, but off now), uncontrolled, with complications (CAD, TIA, PN). Pt. previously saw Dr. Everardo All, but last visit with me 4 months ago. Insurance: Community education officer  Interim history: + increased urination, but no blurry vision, nausea. He is on a very limited income.  He also has a lot of stress at work. He had to come off Ozempic since last visit due to price 2 to 3 months ago.  He was not able to obtain patient assistance because he has Nurse, learning disability.  Reviewed HbA1c: Lab Results  Component Value Date   HGBA1C 7.6 (H) 08/21/2023   HGBA1C 6.9 06/23/2023   HGBA1C 7.5 (H) 01/08/2023   HGBA1C 8.3 (A) 11/04/2022   HGBA1C 8.6 (H) 09/06/2022   HGBA1C 9.0 (A) 08/26/2022   HGBA1C 10.0 (A) 04/18/2022   HGBA1C 7.8 (A) 01/17/2022   HGBA1C 7.3 (A) 10/11/2021   HGBA1C 7.1 (A) 07/12/2021   Pt is on a regimen of: - Metformin ER 1000 mg 2x daily >> occas. Loose stools - Glipizide 5 mg before bedtime >> before breakfast >> after b'fast >> - Ozempic 0.25 >> 0.5 mg weekly >> off since 2-3 mo ago 2/2 cost He has needle phobia with syringes.  He has no problems injecting insulin with pens. He did not want to try Farxiga due to fear of side effects. He was on Trulicity >> could not obtain it.  He was on [place] >> not affordable.  Pt checks his sugars 2-3x a day and they are: - am: going to bed: 120-130 >> 89-139 >> (bedtime) <160 >> 150-175 - 2h after b'fast: n/c - before lunch: n/c - 2h after lunch: n/c - before dinner: waking up: n/c >> 97, 112-115 >> 180-190 - after dinner 130, ave 145, 213 >> 128-150 >> <160, 185 >> 200s - bedtime: n/c - nighttime: n/c Lowest sugar was 22 (many years ago - on 10 mg Glipizide then) >>... 80s >> 97 >> 150; he has  hypoglycemia awareness at 70.  Highest sugar was 999 (long time ago)  >> .Marland KitchenMarland Kitchen240 (ate late) >> 185 >> upper 200s (ramen noodles).  Glucometer:?  - no CKD, last BUN/creatinine:  Lab Results  Component Value Date   BUN 17 08/21/2023   BUN 15 01/21/2023   CREATININE 0.72 (L) 08/21/2023   CREATININE 0.61 01/21/2023  No results found for: "MICRALBCREAT" He is not on ACE inhibitor/ARB.  - He has HL; last set of lipids: Lab Results  Component Value Date   CHOL 97 (L) 08/21/2023   HDL 32 (L) 08/21/2023   LDLCALC 51 08/21/2023   TRIG 65 08/21/2023   CHOLHDL 3.0 08/21/2023  On Lipitor 80 mg daily.  - last eye exam was "several years ago". No DR reportedly.  He cannot afford an a new exam.  - + numbness and tingling in his feet.  Last foot exam 11/04/2022.  He also has a history of bipolar disease, emphysema, migraines. He was in the ED with A-fib with RVR 09/06/2022. He had to have an ablation.  ROS: + see HPI  Past Medical History:  Diagnosis Date   Atrial flutter (HCC)    Bipolar 1 disorder (HCC)    Coronary artery disease    Diabetes mellitus    Dyslipidemia  Emphysema    GERD (gastroesophageal reflux disease)    History of kidney stones    Hypertension    Mental disorder    BIPOLAR DISORDER   Migraine    NSTEMI (non-ST elevated myocardial infarction) (HCC) 09/20/2020   Pneumonia    PONV (postoperative nausea and vomiting)    Renal disorder    Shortness of breath    Stroke (HCC)    Transient ischemic attack (TIA)    Past Surgical History:  Procedure Laterality Date   A-FLUTTER ABLATION N/A 09/08/2022   Procedure: A-FLUTTER ABLATION;  Surgeon: Maurice Small, MD;  Location: [facility] INVASIVE CV LAB;  Service: Cardiovascular;  Laterality: N/A;   COLONOSCOPY WITH PROPOFOL N/A 10/17/2021   Procedure: COLONOSCOPY WITH PROPOFOL;  Surgeon: Rachael Fee, MD;  Location: [facility] ENDOSCOPY;  Service: Endoscopy;  Laterality: N/A;   CORONARY ANGIOGRAPHY  09/20/2020   CORONARY  STENT INTERVENTION  09/20/2020   CORONARY STENT INTERVENTION   CORONARY STENT INTERVENTION N/A 09/20/2020   Procedure: CORONARY STENT INTERVENTION;  Surgeon: Lyn Records, MD;  Location: [facility] INVASIVE CV LAB;  Service: Cardiovascular;  Laterality: N/A;   CORONARY STENT INTERVENTION N/A 03/28/2021   Procedure: CORONARY STENT INTERVENTION;  Surgeon: Tonny Bollman, MD;  Location: [facility] INVASIVE CV LAB;  Service: Cardiovascular;  Laterality: N/A;   HEMORROIDECTOMY     KNEE ARTHROSCOPY     LEFT HEART CATH AND CORONARY ANGIOGRAPHY N/A 09/20/2020   Procedure: LEFT HEART CATH AND CORONARY ANGIOGRAPHY;  Surgeon: Lyn Records, MD;  Location: [facility]VAS[facility] CV LAB;  Service: Cardiovascular;  Laterality: N/A;   LEFT HEART CATH AND CORONARY ANGIOGRAPHY N/A 03/28/2021   Procedure: LEFT HEART CATH AND CORONARY ANGIOGRAPHY;  Surgeon: Tonny Bollman, MD;  Location: [facility] INVASIVE CV LAB;  Service: Cardiovascular;  Laterality: N/A;   LUMBAR DISC SURGERY     POLYPECTOMY  10/17/2021   Procedure: POLYPECTOMY;  Surgeon: Rachael Fee, MD;  Location: [facility]OS[facility];  Service: Endoscopy;;   SHOULDER ARTHROSCOPY WITH DISTAL CLAVICLE RESECTION Left 01/20/2023   Procedure: SHOULDER ARTHROSCOPY WITH DISTAL CLAVICLE EXCISION;  Surgeon: Teryl Lucy, MD;  Location: [facility] ORS;  Service: Orthopedics;  Laterality: Left;   SHOULDER ARTHROSCOPY WITH ROTATOR CUFF REPAIR AND SUBACROMIAL DECOMPRESSION Left 01/20/2023   Procedure: SHOULDER ARTHROSCOPY WITH ROTATOR CUFF REPAIR AND SUBACROMIAL DECOMPRESSION;  Surgeon: Teryl Lucy, MD;  Location: [facility];  Service: Orthopedics;  Laterality: Left;   TONSILLECTOMY AND ADENOIDECTOMY     Social History   Socioeconomic History   Marital status: Single    Spouse name: Not on file   Number of children: Not on file   Years of education: Not on file   Highest education level: Not on file  Occupational History   Occupation: Security guard    Employer: [employer]  Tobacco Use   Smoking  status: Former    Types: E-cigarettes   Smokeless tobacco: Former    Quit date: 2022  Vaping Use   Vaping status: Former  Substance and Sexual Activity   Alcohol use: No   Drug use: Not Currently   Sexual activity: Not Currently  Other Topics Concern   Not on file  Social History Narrative   Not on file   Social Determinants of Health   Financial Resource Strain: Not on file  Food Insecurity: Food Insecurity Present (09/09/2022)   Hunger Vital Sign    Worried About Running Out of Food in the Last Year: Sometimes true    Ran Out of Food in  the Last Year: Never true  Transportation Needs: No Transportation Needs (09/06/2022)   PRAPARE - Administrator, Civil Service (Medical): No    Lack of Transportation (Non-Medical): No  Physical Activity: Not on file  Stress: Not on file  Social Connections: Not on file  Intimate Partner Violence: Not At Risk (09/06/2022)   Humiliation, Afraid, Rape, and Kick questionnaire    Fear of Current or Ex-Partner: No    Emotionally Abused: No    Physically Abused: No    Sexually Abused: No   Current Outpatient Medications on File Prior to Visit  Medication Sig Dispense Refill   albuterol (VENTOLIN HFA) 108 (90 Base) MCG/ACT inhaler Inhale 1-2 puffs into the lungs every 6 (six) hours as needed for wheezing or shortness of breath. 18 g 6   aspirin EC 81 MG tablet Take 1 tablet (81 mg total) by mouth daily. Swallow whole.     atorvastatin (LIPITOR) 80 MG tablet TAKE 1 TABLET BY MOUTH EVERY DAY 30 tablet 11   glipiZIDE (GLUCOTROL) 5 MG tablet TAKE 1 TABLET(5 MG) BY MOUTH DAILY BEFORE BREAKFAST 90 tablet 0   metFORMIN (GLUCOPHAGE-XR) 500 MG 24 hr tablet Take 4 tablets (2,000 mg total) by mouth daily. 360 tablet 3   methocarbamol (ROBAXIN) 500 MG tablet Take 1 tablet (500 mg total) by mouth every 8 (eight) hours as needed for muscle spasms. 30 tablet 0   metoprolol tartrate (LOPRESSOR) 100 MG tablet Take 1 tablet (100 mg total) by mouth 2  (two) times daily. 180 tablet 3   nitroGLYCERIN (NITROSTAT) 0.4 MG SL tablet Place 1 tablet (0.4 mg total) under the tongue every 5 (five) minutes as needed for chest pain. 25 tablet 3   oxyCODONE (ROXICODONE) 5 MG immediate release tablet Take 1 tablet (5 mg total) by mouth every 4 (four) hours as needed for severe pain. 30 tablet 0   No current facility-administered medications on file prior to visit.   Allergies  Allergen Reactions   Bee Venom Anaphylaxis   Penicillins Anaphylaxis    Did it involve swelling of the face/tongue/throat, SOB, or low BP? yes Did it involve sudden or severe rash/hives, skin peeling, or any reaction on the inside of your mouth or nose? unknown Did you need to seek medical attention at a hospital or doctor's office? yes When did it last happen?      1966 If all above answers are "NO", may proceed with cephalosporin use.    Procaine Anaphylaxis   Isosorbide Other (See Comments)    Caused migraines   Tape Itching and Other (See Comments)    Certain medical tapes make the patient's skin ITCH   Family History  Problem Relation Age of Onset   Diabetes type II Other    Coronary artery disease Other    Bipolar disorder Other    Lung cancer Father    CAD Mother 8   PE: BP 138/86   Pulse 67   Ht 5\' 8"  (1.727 m)   Wt 249 lb (112.9 kg)   SpO2 97%   BMI 37.86 kg/m  Wt Readings from Last 3 Encounters:  10/27/23 249 lb (112.9 kg)  08/21/23 242 lb 3.2 oz (109.9 kg)  06/23/23 238 lb (108 kg)   Constitutional: overweight, in NAD Eyes: no exophthalmos ENT: no thyromegaly, no cervical lymphadenopathy Cardiovascular: RRR, No MRG Respiratory: CTA B Musculoskeletal: no deformities Skin:  no rashes Neurological: + mild tremor with outstretched hands  ASSESSMENT: 1. DM2, noninsulin-dependent, but  off insulin now, uncontrolled, with complications - CAD, s/p NSTEMI - cerebro-vascular disease, s/p TIA - PN  2. HL  3.  Obesity class III  PLAN:  1.  Patient with longstanding, uncontrolled, type 2 diabetes, on oral antidiabetic regimen with metformin and sulfonylurea. He was  previously on weekly GLP-1 receptor agonist, with improvement in control on Ozempic.  He had acid reflux on this, but this was helped by Gaviscon.  At last visit, we did not change his regimen.  We resubmitted PAP paperwork.  HbA1c at that time was better, at 6.9%.  However, he had another HbA1c obtained 2 months ago and this was 7.6%, higher. -At today's visit, sugars are higher.  Upon questioning, he had to come off Ozempic 2 to 3 months ago due to price.  He send patient assistance documents but he was denied due to the fact that he has Nurse, learning disability.  He mentions that up and cost $800 with insurance.  He is not able to afford this.  At today's visit, we gave him 2 pens samples, but I advised him that if he runs out, we will need to increase the dose of glipizide to twice a day.  He is currently still taking glipizide after his morning meal and I strongly advised him to take it 30 minutes before.  Will continue metformin for now. - I suggested to:  Patient Instructions  Please continue: - Metformin ER 1000 mg 2x daily  Restart: - Ozempic 0.25 mg weekly in a.m. x 2 doses, then increase to 0.5 mg weekly  When you run out of Ozempic, try: - Glipizide 5 mg 2x a day 30 min before meals  Please return in 4 months with your sugar log.   - we checked his HbA1c: 8.5% (higher) - advised to check sugars at different times of the day - 1x a day, rotating check times - advised for yearly eye exams >> he is UTD - will check an ACR today - return to clinic in 4 months  2. HL -Reviewed latest lipid panel from 08/2023: Fractions at goal with exception of a low HDL: Lab Results  Component Value Date   CHOL 97 (L) 08/21/2023   HDL 32 (L) 08/21/2023   LDLCALC 51 08/21/2023   TRIG 65 08/21/2023   CHOLHDL 3.0 08/21/2023  -He is on Lipitor 80 mg daily without side  effects  3.  Obesity class III -He unfortunately had to come off Ozempic which is helping with weight control.  We gave him 2 samples of Ozempic.  I am hoping that in the new year, we can again start him on it.  He is considering switching to Medicaid. -Lost 18 pounds before the last 2 visits combined -while on Ozempic. -He gained 11 pounds since last visit -after coming off Ozempic.  Carlus Pavlov, MD PhD [facility]

## 2023-10-27 NOTE — Telephone Encounter (Signed)
Sample  Medication:Ozempic Dose: 0.25/0.5 mg Quantity:2 QMV:[id number] EXP:02/11/25  Besan Ketchem,RMA   Samples provided at Visit with Dr. Elvera Lennox.

## 2023-10-27 NOTE — Patient Instructions (Addendum)
Please continue: - Metformin ER 1000 mg 2x daily  Restart: - Ozempic 0.25 mg weekly in a.m. x 2 doses, then increase to 0.5 mg weekly  When you run out of Ozempic, try: - Glipizide 5 mg 2x a day 30 min before meals  Please return in 4 months with your sugar log.

## 2023-11-02 ENCOUNTER — Telehealth (HOSPITAL_COMMUNITY): Payer: Self-pay | Admitting: *Deleted

## 2023-11-02 NOTE — Telephone Encounter (Signed)
Attempted to call patient regarding upcoming cardiac PET appointment. Left message on voicemail with name and callback number  Larey Brick RN Navigator Cardiac Imaging [facility] [phone] Office [phone] Cell  Reminder to hold caffeine 12 hours prior to his cardiac PET scan.

## 2023-11-03 ENCOUNTER — Encounter (HOSPITAL_COMMUNITY)
Admission: RE | Admit: 2023-11-03 | Discharge: 2023-11-03 | Disposition: A | Discharge status: Home / Self Care | Payer: 59 | Source: Ambulatory Visit | Attending: Cardiovascular Disease | Admitting: Cardiovascular Disease

## 2023-11-03 DIAGNOSIS — R079 Chest pain, unspecified: Secondary | ICD-10-CM | POA: Insufficient documentation

## 2023-11-03 DIAGNOSIS — I25119 Atherosclerotic heart disease of native coronary artery with unspecified angina pectoris: Secondary | ICD-10-CM | POA: Insufficient documentation

## 2023-11-03 MED ORDER — RUBIDIUM RB82 GENERATOR (RUBYFILL)
29.3700 | PACK | Freq: Once | INTRAVENOUS | Status: AC
  Administered 2023-11-03: 29.37 via INTRAVENOUS

## 2023-11-03 MED ORDER — REGADENOSON 0.4 MG/5ML IV SOLN
0.4000 mg | Freq: Once | INTRAVENOUS | Status: AC
  Administered 2023-11-03: 0.4 mg via INTRAVENOUS

## 2023-11-03 MED ORDER — REGADENOSON 0.4 MG/5ML IV SOLN
INTRAVENOUS | Status: AC
  Filled 2023-11-03: qty 5

## 2023-11-03 MED ORDER — RUBIDIUM RB82 GENERATOR (RUBYFILL)
29.2700 | PACK | Freq: Once | INTRAVENOUS | Status: AC
  Administered 2023-11-03: 29.27 via INTRAVENOUS

## 2023-11-03 NOTE — Progress Notes (Signed)
Pt tolerated cardiac stress test, only c/o of minor headache. Ambulated to waiting room independently.

## 2023-11-04 ENCOUNTER — Telehealth: Payer: Self-pay | Admitting: Emergency Medicine

## 2023-11-04 LAB — NM PET CT CARDIAC PERFUSION MULTI W/ABSOLUTE BLOODFLOW
LV dias vol: 140 mL (ref 62–150)
LV sys vol: 69 mL
MBFR: 2.26
Nuc Rest EF: 51 %
Nuc Stress EF: 58 %
Peak HR: 78 {beats}/min
Rest HR: 62 {beats}/min
Rest MBF: 0.62 ml/g/min
Rest Nuclear Isotope Dose: 29.3 mCi
SDS: 5
ST Depression (mm): 0 mm
Stress MBF: 1.4 ml/g/min
Stress Nuclear Isotope Dose: 29.4 mCi

## 2023-11-04 NOTE — Telephone Encounter (Signed)
Called to give test results and recommendations below. No answer, left message and call back number.   Thurmon Fair, MD 11/04/2023 10:14 AM EST Back to Top    There does appear to be evidence of new blockage and I think he will need another cardiac catheterization if he is still having angina on the current medications.  He has an appointment scheduled 11/20/2023 with Azalee Course, PA.  Will need a face-to-face visit before we can schedule cardiac catheterization.  If he has severe chest pain lasting more than 20-30 minutes he should go to the emergency room urgently.

## 2023-11-04 NOTE — Telephone Encounter (Signed)
Went over the result information and recommendations. He verbalized understanding of all instructions.

## 2023-11-20 ENCOUNTER — Other Ambulatory Visit: Payer: Self-pay

## 2023-11-20 ENCOUNTER — Ambulatory Visit: Payer: 59 | Attending: Physician Assistant | Admitting: Physician Assistant

## 2023-11-20 ENCOUNTER — Encounter (HOSPITAL_COMMUNITY)
Admission: EM | Disposition: A | Discharge status: Home / Self Care | Payer: Self-pay | Source: Home / Self Care | Attending: Emergency Medicine

## 2023-11-20 ENCOUNTER — Emergency Department (HOSPITAL_COMMUNITY): Payer: Self-pay

## 2023-11-20 ENCOUNTER — Encounter (HOSPITAL_COMMUNITY): Payer: Self-pay | Admitting: *Deleted

## 2023-11-20 ENCOUNTER — Encounter: Payer: Self-pay | Admitting: Physician Assistant

## 2023-11-20 ENCOUNTER — Observation Stay (HOSPITAL_COMMUNITY)
Admission: EM | Admit: 2023-11-20 | Discharge: 2023-11-21 | Disposition: A | Discharge status: Home / Self Care | Payer: 59 | Attending: Cardiology | Admitting: Cardiology

## 2023-11-20 VITALS — BP 134/86 | HR 90 | Ht 68.0 in | Wt 244.0 lb

## 2023-11-20 DIAGNOSIS — I10 Essential (primary) hypertension: Secondary | ICD-10-CM | POA: Diagnosis not present

## 2023-11-20 DIAGNOSIS — Z87891 Personal history of nicotine dependence: Secondary | ICD-10-CM | POA: Insufficient documentation

## 2023-11-20 DIAGNOSIS — Z79899 Other long term (current) drug therapy: Secondary | ICD-10-CM | POA: Diagnosis not present

## 2023-11-20 DIAGNOSIS — R109 Unspecified abdominal pain: Secondary | ICD-10-CM

## 2023-11-20 DIAGNOSIS — I200 Unstable angina: Principal | ICD-10-CM | POA: Diagnosis present

## 2023-11-20 DIAGNOSIS — I25110 Atherosclerotic heart disease of native coronary artery with unstable angina pectoris: Principal | ICD-10-CM | POA: Insufficient documentation

## 2023-11-20 DIAGNOSIS — Z8673 Personal history of transient ischemic attack (TIA), and cerebral infarction without residual deficits: Secondary | ICD-10-CM | POA: Diagnosis not present

## 2023-11-20 DIAGNOSIS — Z7984 Long term (current) use of oral hypoglycemic drugs: Secondary | ICD-10-CM | POA: Diagnosis not present

## 2023-11-20 DIAGNOSIS — E119 Type 2 diabetes mellitus without complications: Secondary | ICD-10-CM | POA: Diagnosis not present

## 2023-11-20 DIAGNOSIS — E785 Hyperlipidemia, unspecified: Secondary | ICD-10-CM

## 2023-11-20 DIAGNOSIS — I209 Angina pectoris, unspecified: Secondary | ICD-10-CM

## 2023-11-20 DIAGNOSIS — Z955 Presence of coronary angioplasty implant and graft: Secondary | ICD-10-CM | POA: Insufficient documentation

## 2023-11-20 DIAGNOSIS — R079 Chest pain, unspecified: Secondary | ICD-10-CM | POA: Diagnosis present

## 2023-11-20 DIAGNOSIS — I214 Non-ST elevation (NSTEMI) myocardial infarction: Principal | ICD-10-CM

## 2023-11-20 DIAGNOSIS — I4892 Unspecified atrial flutter: Secondary | ICD-10-CM

## 2023-11-20 DIAGNOSIS — R911 Solitary pulmonary nodule: Secondary | ICD-10-CM

## 2023-11-20 HISTORY — PX: CORONARY STENT INTERVENTION: CATH118234

## 2023-11-20 HISTORY — PX: LEFT HEART CATH AND CORONARY ANGIOGRAPHY: CATH118249

## 2023-11-20 LAB — GLUCOSE, CAPILLARY: Glucose-Capillary: 175 mg/dL — ABNORMAL HIGH (ref 70–99)

## 2023-11-20 LAB — COMPREHENSIVE METABOLIC PANEL
ALT: 20 U/L (ref 0–44)
AST: 15 U/L (ref 15–41)
Albumin: 4 g/dL (ref 3.5–5.0)
Alkaline Phosphatase: 101 U/L (ref 38–126)
Anion gap: 11 (ref 5–15)
BUN: 17 mg/dL (ref 8–23)
CO2: 23 mmol/L (ref 22–32)
Calcium: 9.1 mg/dL (ref 8.9–10.3)
Chloride: 102 mmol/L (ref 98–111)
Creatinine, Ser: 0.85 mg/dL (ref 0.61–1.24)
GFR, Estimated: >60 mL/min (ref >60)
Glucose, Bld: 257 mg/dL — ABNORMAL HIGH (ref 70–99)
Potassium: 4 mmol/L (ref 3.5–5.1)
Sodium: 136 mmol/L (ref 135–145)
Total Bilirubin: 1 mg/dL (ref <1.2)
Total Protein: 6.4 g/dL — ABNORMAL LOW (ref 6.5–8.1)

## 2023-11-20 LAB — TROPONIN I (HIGH SENSITIVITY)
Troponin I (High Sensitivity): 8 ng/L (ref <18)
Troponin I (High Sensitivity): 7 ng/L (ref <18)

## 2023-11-20 LAB — CBC WITH DIFFERENTIAL/PLATELET
Abs Immature Granulocytes: 0.07 10*3/uL (ref 0.00–0.07)
Basophils Absolute: 0.1 10*3/uL (ref 0.0–0.1)
Basophils Relative: 0 %
Eosinophils Absolute: 0.1 10*3/uL (ref 0.0–0.5)
Eosinophils Relative: 1 %
HCT: 45.6 % (ref 39.0–52.0)
Hemoglobin: 15 g/dL (ref 13.0–17.0)
Immature Granulocytes: 1 %
Lymphocytes Relative: 7 %
Lymphs Abs: 1 10*3/uL (ref 0.7–4.0)
MCH: 28 pg (ref 26.0–34.0)
MCHC: 32.9 g/dL (ref 30.0–36.0)
MCV: 85.1 fL (ref 80.0–100.0)
Monocytes Absolute: 0.6 10*3/uL (ref 0.1–1.0)
Monocytes Relative: 4 %
Neutro Abs: 13.1 10*3/uL — ABNORMAL HIGH (ref 1.7–7.7)
Neutrophils Relative %: 87 %
Platelets: 263 10*3/uL (ref 150–400)
RBC: 5.36 MIL/uL (ref 4.22–5.81)
RDW: 13.6 % (ref 11.5–15.5)
WBC: 14.9 10*3/uL — ABNORMAL HIGH (ref 4.0–10.5)
nRBC: 0 % (ref 0.0–0.2)

## 2023-11-20 LAB — POCT ACTIVATED CLOTTING TIME: Activated Clotting Time: 308 s

## 2023-11-20 SURGERY — LEFT HEART CATH AND CORONARY ANGIOGRAPHY
Anesthesia: LOCAL

## 2023-11-20 MED ORDER — SODIUM CHLORIDE 0.9 % IV SOLN: INTRAVENOUS | Status: AC

## 2023-11-20 MED ORDER — LIDOCAINE HCL (PF) 1 % IJ SOLN
INTRAMUSCULAR | Status: AC
  Filled 2023-11-20: qty 30

## 2023-11-20 MED ORDER — NITROGLYCERIN 1 MG/10 ML FOR IR/CATH LAB
INTRA_ARTERIAL | Status: DC | PRN
  Administered 2023-11-20: 200 ug via INTRACORONARY

## 2023-11-20 MED ORDER — SODIUM CHLORIDE 0.9 % WEIGHT BASED INFUSION
3.0000 mL/kg/h | INTRAVENOUS | Status: AC
  Administered 2023-11-20: 3 mL/kg/h via INTRAVENOUS

## 2023-11-20 MED ORDER — FAMOTIDINE IN NACL 20-0.9 MG/50ML-% IV SOLN
INTRAVENOUS | Status: AC
  Filled 2023-11-20: qty 50

## 2023-11-20 MED ORDER — VERAPAMIL HCL 2.5 MG/ML IV SOLN
INTRAVENOUS | Status: DC | PRN
  Administered 2023-11-20: 10 mL via INTRA_ARTERIAL

## 2023-11-20 MED ORDER — SODIUM CHLORIDE 0.9% FLUSH: 3.0000 mL | INTRAVENOUS | Status: DC | PRN

## 2023-11-20 MED ORDER — METHOCARBAMOL 500 MG PO TABS: 500.0000 mg | ORAL_TABLET | Freq: Three times a day (TID) | ORAL | Status: DC | PRN

## 2023-11-20 MED ORDER — SODIUM CHLORIDE 0.9 % IV SOLN: 250.0000 mL | INTRAVENOUS | Status: DC | PRN

## 2023-11-20 MED ORDER — ASPIRIN 81 MG PO TBEC
81.0000 mg | DELAYED_RELEASE_TABLET | Freq: Every day | ORAL | Status: DC
  Administered 2023-11-20 – 2023-11-21 (×2): 81 mg via ORAL
  Filled 2023-11-20 (×2): qty 1

## 2023-11-20 MED ORDER — CLOPIDOGREL BISULFATE 300 MG PO TABS
ORAL_TABLET | ORAL | Status: AC
  Filled 2023-11-20: qty 2

## 2023-11-20 MED ORDER — VERAPAMIL HCL 2.5 MG/ML IV SOLN
INTRAVENOUS | Status: AC
  Filled 2023-11-20: qty 2

## 2023-11-20 MED ORDER — HEPARIN (PORCINE) 25000 UT/250ML-% IV SOLN
1200.0000 [IU]/h | INTRAVENOUS | Status: DC
  Administered 2023-11-20: 1200 [IU]/h via INTRAVENOUS
  Filled 2023-11-20: qty 250

## 2023-11-20 MED ORDER — HEPARIN SODIUM (PORCINE) 1000 UNIT/ML IJ SOLN
INTRAMUSCULAR | Status: AC
  Filled 2023-11-20: qty 10

## 2023-11-20 MED ORDER — ALBUTEROL SULFATE HFA 108 (90 BASE) MCG/ACT IN AERS: 1.0000 | INHALATION_SPRAY | Freq: Four times a day (QID) | RESPIRATORY_TRACT | Status: DC | PRN

## 2023-11-20 MED ORDER — ONDANSETRON HCL 4 MG/2ML IJ SOLN: 4.0000 mg | Freq: Four times a day (QID) | INTRAMUSCULAR | Status: DC | PRN

## 2023-11-20 MED ORDER — LABETALOL HCL 5 MG/ML IV SOLN: 10.0000 mg | INTRAVENOUS | Status: AC | PRN

## 2023-11-20 MED ORDER — ASPIRIN 81 MG PO CHEW: 81.0000 mg | CHEWABLE_TABLET | ORAL | Status: DC

## 2023-11-20 MED ORDER — IOHEXOL 350 MG/ML SOLN
INTRAVENOUS | Status: DC | PRN
  Administered 2023-11-20: 120 mL via INTRA_ARTERIAL

## 2023-11-20 MED ORDER — OXYCODONE HCL 5 MG PO TABS: 5.0000 mg | ORAL_TABLET | ORAL | Status: DC | PRN

## 2023-11-20 MED ORDER — ASPIRIN 81 MG PO CHEW
81.0000 mg | CHEWABLE_TABLET | ORAL | Status: AC
  Administered 2023-11-20: 81 mg via ORAL
  Filled 2023-11-20: qty 1

## 2023-11-20 MED ORDER — MIDAZOLAM HCL 2 MG/2ML IJ SOLN
INTRAMUSCULAR | Status: AC
  Filled 2023-11-20: qty 2

## 2023-11-20 MED ORDER — NITROGLYCERIN 1 MG/10 ML FOR IR/CATH LAB
INTRA_ARTERIAL | Status: AC
  Filled 2023-11-20: qty 10

## 2023-11-20 MED ORDER — MIDAZOLAM HCL 2 MG/2ML IJ SOLN
INTRAMUSCULAR | Status: DC | PRN
  Administered 2023-11-20: 2 mg via INTRAVENOUS
  Administered 2023-11-20: 1 mg via INTRAVENOUS

## 2023-11-20 MED ORDER — SODIUM CHLORIDE 0.9% FLUSH
3.0000 mL | Freq: Two times a day (BID) | INTRAVENOUS | Status: DC
  Administered 2023-11-20 – 2023-11-21 (×2): 3 mL via INTRAVENOUS

## 2023-11-20 MED ORDER — ATORVASTATIN CALCIUM 80 MG PO TABS
80.0000 mg | ORAL_TABLET | Freq: Every day | ORAL | Status: DC
  Administered 2023-11-20: 80 mg via ORAL
  Filled 2023-11-20: qty 1

## 2023-11-20 MED ORDER — NITROGLYCERIN 0.4 MG SL SUBL: 0.4000 mg | SUBLINGUAL_TABLET | SUBLINGUAL | Status: DC | PRN

## 2023-11-20 MED ORDER — LIDOCAINE HCL (PF) 1 % IJ SOLN
INTRAMUSCULAR | Status: DC | PRN
  Administered 2023-11-20: 2 mL

## 2023-11-20 MED ORDER — FAMOTIDINE IN NACL 20-0.9 MG/50ML-% IV SOLN
INTRAVENOUS | Status: DC | PRN
  Administered 2023-11-20: 20 mg via INTRAVENOUS

## 2023-11-20 MED ORDER — HEPARIN SODIUM (PORCINE) 1000 UNIT/ML IJ SOLN
INTRAMUSCULAR | Status: DC | PRN
  Administered 2023-11-20: 6000 [IU] via INTRAVENOUS
  Administered 2023-11-20: 5000 [IU] via INTRAVENOUS

## 2023-11-20 MED ORDER — FENTANYL CITRATE (PF) 100 MCG/2ML IJ SOLN
INTRAMUSCULAR | Status: AC
  Filled 2023-11-20: qty 2

## 2023-11-20 MED ORDER — HYDRALAZINE HCL 20 MG/ML IJ SOLN: 10.0000 mg | INTRAMUSCULAR | Status: AC | PRN

## 2023-11-20 MED ORDER — METOPROLOL TARTRATE 100 MG PO TABS
100.0000 mg | ORAL_TABLET | Freq: Two times a day (BID) | ORAL | Status: DC
  Administered 2023-11-20 – 2023-11-21 (×2): 100 mg via ORAL
  Filled 2023-11-20 (×2): qty 1

## 2023-11-20 MED ORDER — SODIUM CHLORIDE 0.9 % WEIGHT BASED INFUSION
1.0000 mL/kg/h | INTRAVENOUS | Status: DC
Start: 2023-11-20 — End: 2023-11-20

## 2023-11-20 MED ORDER — ACETAMINOPHEN 325 MG PO TABS
650.0000 mg | ORAL_TABLET | ORAL | Status: DC | PRN
  Filled 2023-11-20: qty 2

## 2023-11-20 MED ORDER — CLOPIDOGREL BISULFATE 75 MG PO TABS
75.0000 mg | ORAL_TABLET | Freq: Every day | ORAL | Status: DC
  Administered 2023-11-21: 75 mg via ORAL
  Filled 2023-11-20: qty 1

## 2023-11-20 MED ORDER — ACETAMINOPHEN 325 MG PO TABS
650.0000 mg | ORAL_TABLET | ORAL | Status: DC | PRN
  Administered 2023-11-20: 650 mg via ORAL

## 2023-11-20 MED ORDER — ALBUTEROL SULFATE (2.5 MG/3ML) 0.083% IN NEBU: 2.5000 mg | INHALATION_SOLUTION | Freq: Four times a day (QID) | RESPIRATORY_TRACT | Status: DC | PRN

## 2023-11-20 MED ORDER — HEPARIN BOLUS VIA INFUSION
4000.0000 [IU] | Freq: Once | INTRAVENOUS | Status: AC
  Administered 2023-11-20: 4000 [IU] via INTRAVENOUS
  Filled 2023-11-20: qty 4000

## 2023-11-20 MED ORDER — CLOPIDOGREL BISULFATE 300 MG PO TABS
ORAL_TABLET | ORAL | Status: DC | PRN
  Administered 2023-11-20: 600 mg via ORAL

## 2023-11-20 MED ORDER — FENTANYL CITRATE (PF) 100 MCG/2ML IJ SOLN
INTRAMUSCULAR | Status: DC | PRN
  Administered 2023-11-20 (×2): 25 ug via INTRAVENOUS

## 2023-11-20 SURGICAL SUPPLY — 21 items
BALL SAPPHIRE NC24 2.75X22 (BALLOONS) ×1 IMPLANT
BALLN EMERGE MR 2.0X12 (BALLOONS) ×1 IMPLANT
BALLN EMERGE MR 2.0X20 (BALLOONS) ×1 IMPLANT
BALLOON EMERGE MR 2.0X12 (BALLOONS) IMPLANT
BALLOON EMERGE MR 2.0X20 (BALLOONS) IMPLANT
BALLOON SAPPHIRE NC24 2.75X22 (BALLOONS) IMPLANT
CATH 5FR JL3.5 JR4 ANG PIG MP (CATHETERS) IMPLANT
CATH LAUNCHER 6FR EBU3.5 (CATHETERS) IMPLANT
DEVICE RAD COMP TR BAND LRG (VASCULAR PRODUCTS) IMPLANT
GLIDESHEATH SLEND A-KIT 6F 22G (SHEATH) IMPLANT
GLIDESHEATH SLEND SS 6F .021 (SHEATH) IMPLANT
KIT ENCORE 26 ADVANTAGE (KITS) IMPLANT
KIT HEART LEFT (KITS) IMPLANT
PACK CARDIAC CATHETERIZATION (CUSTOM PROCEDURE TRAY) ×1 IMPLANT
SHEATH PROBE COVER 6X72 (BAG) IMPLANT
STENT SYNERGY XD 2.50X38 (Permanent Stent) IMPLANT
SYNERGY XD 2.50X38 (Permanent Stent) ×1 IMPLANT
TRANSDUCER W/STOPCOCK (MISCELLANEOUS) IMPLANT
TUBING CIL FLEX 10 FLL-RA (TUBING) IMPLANT
WIRE EMERALD 3MM-J .035X260CM (WIRE) IMPLANT
WIRE RUNTHROUGH .014X180CM (WIRE) IMPLANT

## 2023-11-20 NOTE — ED Provider Notes (Signed)
[place] EMERGENCY DEPARTMENT AT [facility] Provider Note   CSN: [id number] Arrival date & time: 11/20/23  1011     History  Chief Complaint  Patient presents with   Chest Pain    Jerry Shaffer is a 62 y.o. male.  HPI Presents via EMS from cardiology clinic with concern for chest pain.  He has a history of multiple MI, seemingly is not taking any current antiplatelet regimen.  Over the past weeks, possibly months he has had chest pain, worse today.  He went to scheduled cardiology visit, and after describing chest pain, that did not improve after 3 nitroglycerin there he was sent here for evaluation.  Per EMS no hemodynamic instability, chest pain 3/10    Home Medications Prior to Admission medications   Medication Sig Start Date End Date Taking? Authorizing Provider  albuterol (VENTOLIN HFA) 108 (90 Base) MCG/ACT inhaler Inhale 1-2 puffs into the lungs every 6 (six) hours as needed for wheezing or shortness of breath. 08/21/23  Yes Croitoru, Mihai, MD  atorvastatin (LIPITOR) 80 MG tablet TAKE 1 TABLET BY MOUTH EVERY DAY 06/16/23  Yes Croitoru, Mihai, MD  glipiZIDE (GLUCOTROL) 5 MG tablet Take 1 tablet (5 mg total) by mouth 2 (two) times daily before a meal. 10/27/23  Yes Carlus Pavlov, MD  metFORMIN (GLUCOPHAGE-XR) 500 MG 24 hr tablet Take 4 tablets (2,000 mg total) by mouth daily. 09/28/23  Yes Carlus Pavlov, MD  methocarbamol (ROBAXIN) 500 MG tablet Take 1 tablet (500 mg total) by mouth every 8 (eight) hours as needed for muscle spasms. 01/21/23  Yes Janine Ores K, PA-C  metoprolol tartrate (LOPRESSOR) 100 MG tablet Take 1 tablet (100 mg total) by mouth 2 (two) times daily. 08/21/23  Yes Croitoru, Mihai, MD  aspirin EC 81 MG tablet Take 1 tablet (81 mg total) by mouth daily. Swallow whole. Patient not taking: Reported on 11/20/2023 08/21/23   Croitoru, Rachelle Hora, MD  nitroGLYCERIN (NITROSTAT) 0.4 MG SL tablet Place 1 tablet (0.4 mg total) under the tongue every 5 (five)  minutes as needed for chest pain. 08/21/23 11/19/23  Croitoru, Mihai, MD  oxyCODONE (ROXICODONE) 5 MG immediate release tablet Take 1 tablet (5 mg total) by mouth every 4 (four) hours as needed for severe pain. Patient not taking: Reported on 11/20/2023 01/21/23   Armida Sans, PA-C      Allergies    Bee venom, Penicillins, Procaine, Isosorbide, and Tape    Review of Systems   Review of Systems  Physical Exam Updated Vital Signs BP 132/89 (BP Location: Left Arm)   Pulse 77   Temp 98.5 F (36.9 C) (Oral)   Resp 18   Wt 110.7 kg   SpO2 97%   BMI 37.10 kg/m  Physical Exam Vitals and nursing note reviewed.  Constitutional:      General: He is not in acute distress.    Appearance: He is well-developed.  HENT:     Head: Normocephalic and atraumatic.  Eyes:     Conjunctiva/sclera: Conjunctivae normal.  Cardiovascular:     Rate and Rhythm: Normal rate and regular rhythm.  Pulmonary:     Effort: Pulmonary effort is normal. No respiratory distress.     Breath sounds: No stridor.  Abdominal:     General: There is no distension.  Skin:    General: Skin is warm and dry.  Neurological:     Mental Status: He is alert and oriented to person, place, and time.     ED  Results / Procedures / Treatments   Labs (all labs ordered are listed, but only abnormal results are displayed) Labs Reviewed  COMPREHENSIVE METABOLIC PANEL - Abnormal; Notable for the following components:      Result Value   Glucose, Bld 257 (*)    Total Protein 6.4 (*)    All other components within normal limits  CBC WITH DIFFERENTIAL/PLATELET - Abnormal; Notable for the following components:   WBC 14.9 (*)    Neutro Abs 13.1 (*)    All other components within normal limits  HEPARIN LEVEL (UNFRACTIONATED)  TROPONIN I (HIGH SENSITIVITY)  TROPONIN I (HIGH SENSITIVITY)    EKG Sinus rhythm, 88, LVH abnormal  Radiology DG Chest Port 1 View  Result Date: 11/20/2023 CLINICAL DATA:  Chest pain. EXAM: PORTABLE  CHEST 1 VIEW COMPARISON:  09/14/2022. FINDINGS: Bilateral lung fields are clear. Bilateral costophrenic angles are clear. Normal cardio-mediastinal silhouette. No acute osseous abnormalities. The soft tissues are within normal limits. IMPRESSION: No active disease. Electronically Signed   By: Jules Schick M.D.   On: 11/20/2023 11:33    Procedures Procedures    Medications Ordered in ED Medications  0.9% sodium chloride infusion (3 mL/kg/hr  110.7 kg Intravenous New Bag/Given 11/20/23 1333)    Followed by  0.9% sodium chloride infusion (has no administration in time range)  heparin ADULT infusion 100 units/mL (25000 units/222mL) (1,200 Units/hr Intravenous New Bag/Given 11/20/23 1336)  aspirin chewable tablet 81 mg (81 mg Oral Given 11/20/23 1312)  heparin bolus via infusion 4,000 Units (4,000 Units Intravenous Bolus from Bag 11/20/23 1336)    ED Course/ Medical Decision Making/ A&P                                 Medical Decision Making Adult male presents from cardiology clinic with concern for ongoing chest pain, worse today.  With his substantial risk profile with multiple prior MI, no current antiplatelet use, concern for unstable angina versus acute coronary syndrome.  No early evidence for infection.  No neurologic complaints or findings suggesting dissection. Cardiac 75 sinus normal line pulse ox 100% room air normal   Amount and/or Complexity of Data Reviewed Independent Historian: EMS External Data Reviewed: notes. Labs: ordered. Decision-making details documented in ED Course. Radiology: ordered and independent interpretation performed. Decision-making details documented in ED Course. ECG/medicine tests: ordered and independent interpretation performed. Decision-making details documented in ED Course.  Risk Prescription drug management. Decision regarding hospitalization. Diagnosis or treatment significantly limited by social determinants of health.  Update: I discussed  patient's case with our cardiology colleagues will start heparin with concern for unstable angina.,   3:02 PM Patient now" annoying" present, but minimal.  Patient is receiving heparin.   Final Clinical Impression(s) / ED Diagnoses Final diagnoses:  Unstable angina (HCC)   CRITICAL CARE Performed by: Gerhard Munch Total critical care 35 minutes Critical care time was exclusive of separately billable procedures and treating other patients. Critical care was necessary to treat or prevent imminent or life-threatening deterioration. Critical care was time spent personally by me on the following activities: development of treatment plan with patient and/or surrogate as well as nursing, discussions with consultants, evaluation of patient's response to treatment, examination of patient, obtaining history from patient or surrogate, ordering and performing treatments and interventions, ordering and review of laboratory studies, ordering and review of radiographic studies, pulse oximetry and re-evaluation of patient's condition.    Gerhard Munch, MD  11/20/23 1503  

## 2023-11-20 NOTE — Progress Notes (Signed)
PHARMACY - ANTICOAGULATION CONSULT NOTE  Pharmacy Consult for heparin Indication: chest pain/ACS  Allergies  Allergen Reactions   Bee Venom Anaphylaxis   Penicillins Anaphylaxis    Did it involve swelling of the face/tongue/throat, SOB, or low BP? yes Did it involve sudden or severe rash/hives, skin peeling, or any reaction on the inside of your mouth or nose? unknown Did you need to seek medical attention at a hospital or doctor's office? yes When did it last happen?      1967 If all above answers are "NO", may proceed with cephalosporin use.    Procaine Anaphylaxis   Isosorbide Other (See Comments)    Caused migraines   Tape Itching and Other (See Comments)    Certain medical tapes make the patient's skin [facility]    Patient Measurements: Weight: 110.7 kg (244 lb) Heparin Dosing Weight: 93kg  Vital Signs: Temp: 98.5 F (36.9 C) (12/06 1011) Temp Source: Oral (12/06 1011) BP: 132/89 (12/06 1011) Pulse Rate: 77 (12/06 1011)  Labs: Recent Labs    11/20/23 1022  HGB 15.0  HCT 45.6  PLT 263    CrCl cannot be calculated (Patient's most recent lab result is older than the maximum 21 days allowed.).   Medical History: Past Medical History:  Diagnosis Date   Atrial flutter (HCC)    Bipolar 1 disorder (HCC)    Coronary artery disease    Diabetes mellitus    Dyslipidemia    Emphysema    GERD (gastroesophageal reflux disease)    History of kidney stones    Hypertension    Mental disorder    BIPOLAR DISORDER   Migraine    NSTEMI (non-ST elevated myocardial infarction) (HCC) 09/20/2020   Pneumonia    PONV (postoperative nausea and vomiting)    Renal disorder    Shortness of breath    Stroke (HCC)    Transient ischemic attack (TIA)     Assessment: 78 YOM presenting with CP, hx of aflutter Eliquis discontinued s/p ablation in September.    Goal of Therapy:  Heparin level 0.3-0.7 units/ml Monitor platelets by anticoagulation protocol: Yes   Plan:  Heparin  4000 units Iv x 1, and gtt at 1200 units/hr F/u heparin level in 6 hours F/u cath plans  Daylene Posey, PharmD, [facility] Clinical Pharmacist ED Pharmacist Phone # [phone] 11/20/2023 12:41 PM

## 2023-11-20 NOTE — Progress Notes (Signed)
TR BAND REMOVAL  LOCATION:    right radial  DEFLATED PER PROTOCOL:   yes  TIME BAND OFF / DRESSING APPLIED:    2015  SITE UPON ARRIVAL:    Level 0  SITE AFTER BAND REMOVAL:    Level 0  CIRCULATION SENSATION AND MOVEMENT:    Within Normal Limits : yes  COMMENTS:

## 2023-11-20 NOTE — ED Notes (Signed)
Went to cath lab

## 2023-11-20 NOTE — Interval H&P Note (Signed)
History and Physical Interval Note:  11/20/2023 4:17 PM  Jerry Shaffer  has presented today for surgery, with the diagnosis of nstemi.  The various methods of treatment have been discussed with the patient and family. After consideration of risks, benefits and other options for treatment, the patient has consented to  Procedure(s): LEFT HEART CATH AND CORONARY ANGIOGRAPHY (N/A) as a surgical intervention.  The patient's history has been reviewed, patient examined, no change in status, stable for surgery.  I have reviewed the patient's chart and labs.  Questions were answered to the patient's satisfaction.     Tonny Bollman

## 2023-11-20 NOTE — ED Triage Notes (Signed)
BIB [facility] from cardiology office [facility], was being seen for scheduled visit this am, has had ongoing work-up recurrent CP since September, developed CP again on the way to office. Endorses sob, nausea, vomited last night, and diaphoresis. H/o MI in 2021. PTA given 3 ntg w/o significant change. EMS gave ASA 324mg . No IV. VSS. 150/90, HR 80, NSR, 97% RA. EDP present in triage. Alert, NAD, calm, interactive. CP 3-4/10, improved.

## 2023-11-20 NOTE — H&P (Addendum)
Cardiology Admission History and Physical   Patient ID: Jerry Shaffer MRN: [id number]; DOB: May 07, 1961   Admission date: 11/20/2023  PCP:  Patient, No Pcp Per   [facility] Providers Cardiologist:  Thurmon Fair, MD  Electrophysiologist:  Maurice Small, MD        Chief Complaint:  Chest pain  Patient Profile:   Jerry Shaffer is a 62 y.o. male with CAD, ADHD, chronic smoker, migraine headaches, atrial flutter s/p ablation by Dr. Nelly Laurence 9/23, hypertension, hyperlipidemia, and DM2 who is being seen 11/20/2023 for the evaluation of unstable angina.  History of Present Illness:   [name]  is a 62 y.o. male with PMH of CAD, ADHD, chronic smoker, migraine headaches, atrial flutter s/p ablation by Dr. Nelly Laurence 9/23, hypertension, hyperlipidemia, and DM2.  Patient had inferior STEMI on 09/20/2020 due to occlusion of mid PDA treated with DES.  He returned back to the hospital in April 2022 with unstable angina and received another DES to proximal LAD.  Patient had moderate residual disease in mid LAD and mild disease in diagonal and 50% mid left circumflex lesion.  Since her atrial flutter ablation, she has not had any recurrence of her arrhythmia.  Echocardiogram in September 2023 showed EF 60 to 65%, mild LVH, no significant valve issue. Myoview in October 2023 showed old inferior and inferolateral scar, no ischemia, EF 53%. He has frequent episode of retrosternal discomfort that improved with sublingual nitroglycerin.  Symptom is not necessarily brought on by exertion.  Patient was seen by Dr. Royann Shivers in September 2024, given lack of recurrence of atrial flutter, Eliquis was discontinued and he was restarted on 81 mg aspirin.  Cholesterol is very well-controlled in September.  Diabetes is poorly controlled, most recent hemoglobin A1c was 8.5.  PET stress test obtained on 11/03/2023 showed ischemia in the distribution of distal oblique marginal branch of the left circumflex  artery or posterolateral ventricular artery branch of the RCA, the study was intermediate risk.  There was no evidence of infarction.  EF 51%.  Extracardiac portion showed a 10 mm groundglass opacity in the right lower lobe, repeat CT recommended in 6 to 12 months.   Patient presents today for evaluation of chest pain.  He says he has been having daily episodes of the chest pain.  On his way here, he started having chest pain again, currently have 7 out of 10 chest pain.  He is diaphoretic on exam.  Initial blood pressure was 160/90.  Even on repeat manual recheck by myself, blood pressure remaining at 160/90.  His symptom is very concerning for unstable angina.  He did mention abdominal bloating and burping last night.  However his primary symptom is substernal chest pain.  The symptom is reminiscent of the previous angina.  EKG does not show acute changes.  However given active chest pain, I discussed the case with Dr. Antoine Poche, we recommend EMS transport to [facility], ED for blood work.  Cardiology service will see the patient and taken for cardiac catheterization today.  I do not think he can wait until next week for outpatient cath.     Past Medical History:  Diagnosis Date   Atrial flutter (HCC)    Bipolar 1 disorder (HCC)    Coronary artery disease    Diabetes mellitus    Dyslipidemia    Emphysema    GERD (gastroesophageal reflux disease)    History of kidney stones    Hypertension    Mental disorder  BIPOLAR DISORDER   Migraine    NSTEMI (non-ST elevated myocardial infarction) (HCC) 09/20/2020   Pneumonia    PONV (postoperative nausea and vomiting)    Renal disorder    Shortness of breath    Stroke (HCC)    Transient ischemic attack (TIA)     Past Surgical History:  Procedure Laterality Date   A-FLUTTER ABLATION N/A 09/08/2022   Procedure: A-FLUTTER ABLATION;  Surgeon: Mealor, Roberts Gaudy, MD;  Location: [facility] INVASIVE CV [facility];  Service: Cardiovascular;  Laterality: N/A;    COLONOSCOPY WITH PROPOFOL N/A 10/17/2021   Procedure: COLONOSCOPY WITH PROPOFOL;  Surgeon: Rachael Fee, MD;  Location: [facility];  Service: Endoscopy;  Laterality: N/A;   CORONARY ANGIOGRAPHY  09/20/2020   CORONARY STENT INTERVENTION  09/20/2020   CORONARY STENT INTERVENTION   CORONARY STENT INTERVENTION N/A 09/20/2020   Procedure: CORONARY STENT INTERVENTION;  Surgeon: Lyn Records, MD;  Location: [facility] INVAS[facility] CV LAB;  Service: Cardiovascular;  Laterality: N/A;   CORONARY STENT INTERVENTION N/A 03/28/2021   Procedure: CORONARY STENT INTERVENTION;  Surgeon: Tonny Bollman, MD;  Location: [facility] INVASIVE CV LAB;  Service: Cardiovascular;  Laterality: N/A;   HEMORROIDECTOMY     KNEE ARTHROSCOPY     LEFT HEART CATH AND CORONARY ANGIOGRAPHY N/A 09/20/2020   Procedure: LEFT HEART CATH AND CORONARY ANGIOGRAPHY;  Surgeon: Lyn Records, MD;  Location: [facility] INVASIVE CV LAB;  Service: Cardiovascular;  Laterality: N/A;   LEFT HEART CATH AND CORONARY ANGIOGRAPHY N/A 03/28/2021   Procedure: LEFT HEART CATH AND CORONARY ANGIOGRAPHY;  Surgeon: Tonny Bollman, MD;  Location: [facility] INVASIVE CV LAB;  Service: Cardiovascular;  Laterality: N/A;   LUMBAR DISC SURGERY     POLYPECTOMY  10/17/2021   Procedure: POLYPECTOMY;  Surgeon: Rachael Fee, MD;  Location: [facility];  Service: Endoscopy;;   SHOULDER ARTHROSCOPY WITH DISTAL CLAVICLE RESECTION Left 01/20/2023   Procedure: SHOULDER ARTHROSCOPY WITH DISTAL CLAVICLE EXCISION;  Surgeon: Teryl Lucy, MD;  Location: [facility];  Service: Orthopedics;  Laterality: Left;   SHOULDER ARTHROSCOPY WITH ROTATOR CUFF REPAIR AND SUBACROMIAL DECOMPRESSION Left 01/20/2023   Procedure: SHOULDER ARTHROSCOPY WITH ROTATOR CUFF REPAIR AND SUBACROMIAL DECOMPRESSION;  Surgeon: Teryl Lucy, MD;  Location: [facility];  Service: Orthopedics;  Laterality: Left;   TONSILLECTOMY AND ADENOIDECTOMY       Medications Prior to Admission: Prior to Admission medications   Medication Sig Start  Date End Date Taking? Authorizing Provider  albuterol (VENTOLIN HFA) 108 (90 Base) MCG/ACT inhaler Inhale 1-2 puffs into the lungs every 6 (six) hours as needed for wheezing or shortness of breath. 08/21/23   Croitoru, Mihai, MD  aspirin EC 81 MG tablet Take 1 tablet (81 mg total) by mouth daily. Swallow whole. 08/21/23   Croitoru, Mihai, MD  atorvastatin (LIPITOR) 80 MG tablet TAKE 1 TABLET BY MOUTH EVERY DAY 06/16/23   Croitoru, Rachelle Hora, MD  glipiZIDE (GLUCOTROL) 5 MG tablet Take 1 tablet (5 mg total) by mouth 2 (two) times daily before a meal. 10/27/23   Carlus Pavlov, MD  metFORMIN (GLUCOPHAGE-XR) 500 MG 24 hr tablet Take 4 tablets (2,000 mg total) by mouth daily. 09/28/23   Carlus Pavlov, MD  methocarbamol (ROBAXIN) 500 MG tablet Take 1 tablet (500 mg total) by mouth every 8 (eight) hours as needed for muscle spasms. 01/21/23   Janine Ores K, PA-C  metoprolol tartrate (LOPRESSOR) 100 MG tablet Take 1 tablet (100 mg total) by mouth 2 (two) times daily. 08/21/23   Croitoru, Mihai, MD  nitroGLYCERIN (NITROSTAT) 0.4  MG SL tablet Place 1 tablet (0.4 mg total) under the tongue every 5 (five) minutes as needed for chest pain. 08/21/23 11/19/23  Croitoru, Mihai, MD  oxyCODONE (ROXICODONE) 5 MG immediate release tablet Take 1 tablet (5 mg total) by mouth every 4 (four) hours as needed for severe pain. 01/21/23   Armida Sans, PA-C     Allergies:    Allergies  Allergen Reactions   Bee Venom Anaphylaxis   Penicillins Anaphylaxis    Did it involve swelling of the face/tongue/throat, SOB, or low BP? yes Did it involve sudden or severe rash/hives, skin peeling, or any reaction on the inside of your mouth or nose? unknown Did you need to seek medical attention at a hospital or doctor's office? yes When did it last happen?      1967 If all above answers are "NO", may proceed with cephalosporin use.    Procaine Anaphylaxis   Isosorbide Other (See Comments)    Caused migraines   Tape Itching and Other (See  Comments)    Certain medical tapes make the patient's skin ITCH    Social History:   Social History   Socioeconomic History   Marital status: Single    Spouse name: Not on file   Number of children: Not on file   Years of education: Not on file   Highest education level: Not on file  Occupational History   Occupation: Security guard    Employer: [employer]  Tobacco Use   Smoking status: Former    Types: E-cigarettes   Smokeless tobacco: Former    Quit date: 2022  Vaping Use   Vaping status: Former  Substance and Sexual Activity   Alcohol use: No   Drug use: Not Currently   Sexual activity: Not Currently  Other Topics Concern   Not on file  Social History Narrative   Not on file   Social Determinants of Health   Financial Resource Strain: Not on file  Food Insecurity: Food Insecurity Present (09/09/2022)   Hunger Vital Sign    Worried About Running Out of Food in the Last Year: Sometimes true    Ran Out of Food in the Last Year: Never true  Transportation Needs: No Transportation Needs (09/06/2022)   PRAPARE - Administrator, Civil Service (Medical): No    Lack of Transportation (Non-Medical): No  Physical Activity: Not on file  Stress: Not on file  Social Connections: Not on file  Intimate Partner Violence: Not At Risk (09/06/2022)   Humiliation, Afraid, Rape, and Kick questionnaire    Fear of Current or Ex-Partner: No    Emotionally Abused: No    Physically Abused: No    Sexually Abused: No    Family History:   The patient's family history includes Bipolar disorder in an other family member; CAD (age of onset: 12) in his mother; Coronary artery disease in an other family member; Diabetes type II in an other family member; Lung cancer in his father.    ROS:  Please see the history of present illness.  All other ROS reviewed and negative.     Physical Exam/Data:   Vitals:   11/20/23 1011 11/20/23 1018 11/20/23 1026  BP: 132/89    Pulse: 77     Resp: 18    Temp: 98.5 F (36.9 C)    TempSrc: Oral    SpO2: 96% 97%   Weight:   110.7 kg   No intake or output data in the  24 hours ending 11/20/23 1225    11/20/2023   10:26 AM 11/20/2023    8:26 AM 10/27/2023    8:58 AM  Last 3 Weights  Weight (lbs) 244 lb 244 lb 249 lb  Weight (kg) 110.678 kg 110.678 kg 112.946 kg     Body mass index is 37.1 kg/m.  General:  Well nourished, well developed, in no acute distress HEENT: normal Neck: no JVD Vascular: No carotid bruits; Distal pulses 2+ bilaterally   Cardiac:  normal S1, S2; RRR; no murmur  Lungs:  clear to auscultation bilaterally, no wheezing, rhonchi or rales  Abd: soft, nontender, no hepatomegaly  Ext: no edema Musculoskeletal:  No deformities, BUE and BLE strength normal and equal Skin: warm and dry  Neuro:  CNs 2-12 intact, no focal abnormalities noted Psych:  Normal affect    EKG:  The ECG that was done and was personally reviewed and demonstrates normal sinus rhythm, no significant ST-T wave changes  Relevant CV Studies:  Cardiac Studies & Procedures   CARDIAC CATHETERIZATION  CARDIAC CATHETERIZATION 03/28/2021  Narrative 1.  Patent left main 2.  Severe stenosis of the proximal LAD, new from cardiac catheterization 6 months ago, treated successfully with PCI using a 3.5 x 20 mm Synergy DES.  The mid LAD is unchanged from the prior study with Medina 0, 1, 1 bifurcation disease with only moderate stenosis in the LAD and mild stenosis in the diagonal 3.  Moderate 50% mid circumflex stenosis 4.  Continued patency of the right PDA stent with mild diffuse nonobstructive PDA and RCA stenosis 5.  Normal LVEDP  Recommendations: Continue dual antiplatelet therapy with aspirin and clopidogrel at least 6 months without interruption.  Should be eligible for hospital discharge tomorrow.  Findings Coronary Findings Diagnostic  Dominance: Right  Left Main There is mild diffuse disease throughout the vessel.  Left  Anterior Descending There is mild diffuse disease throughout the vessel. Prox LAD to Mid LAD lesion is 80% stenosed. The lesion is eccentric. The lesion is mildly calcified. This lesion shows marked change from the prior cardiac catheterization 6 months ago, now clearly a very severe stenosis Mid LAD lesion is 60% stenosed.  Second Diagonal Branch 2nd Diag lesion is 35% stenosed.  Left Circumflex There is mild diffuse disease throughout the vessel.  First Obtuse Marginal Branch Vessel is small in size.  Second Obtuse Marginal Branch Vessel is large in size. 2nd Mrg lesion is 60% stenosed.  Right Coronary Artery There is mild diffuse disease throughout the vessel.  Right Posterior Descending Artery Collaterals RPDA filled by collaterals from Dist LAD.  RPDA-1 lesion is 40% stenosed. Previously placed RPDA-2 stent (unknown type) is widely patent. The previously implanted stent is widely patent with no significant in-stent restenosis  Intervention  Prox LAD to Mid LAD lesion Stent CATH LAUNCHER 56F EBU4.0 guide catheter was inserted. Lesion crossed with guidewire using a WIRE COUGAR XT STRL 190CM. Pre-stent angioplasty was performed using a BALLOON SAPPHIRE 2.5X12. A drug-eluting stent was successfully placed using a SYNERGY XD 3.50X20. Maximum pressure: 12 atm. Post-stent angioplasty was performed using a BALLOON SAPPHIRE [place]as [id number]. Maximum pressure:  18 atm. Post-Intervention Lesion Assessment The intervention was successful. Pre-interventional TIMI flow is 3. Post-intervention TIMI flow is 3. No complications occurred at this lesion. There is a 0% residual stenosis post intervention.   CARDIAC CATHETERIZATION  CARDIAC CATHETERIZATION 09/20/2020  Narrative  Total occlusion of the mid PDA (a large vessel that extends to the left ventricular apex), filling  sluggishly by apical LAD to PDA collaterals.  This vessel is felt to represent the culprit for the patient's  presentation.  Total occlusion of the mid PDA reduced to 0% using an 18 x 2.75 mm Onyx deployed at 12 atm x 2 inflations.  TIMI grade III flow was noted.  Resolution of chest pain occurred.  Proximal to the stented segment there is a 30 to 40% stenosis in the PDA.  Left main is widely patent  LAD contains mid vessel disease with a Medina 011 bifurcation stenosis.  LAD is 70% and diagonal is 40%.  This can be managed medically.  Circumflex contains segmental 40 to 50% stenosis in the large second obtuse marginal.  RCA is dominant, tortuous, and no significant disease other than that noted above in the PDA.  Mid inferior wall to apical akinesis.  LVEDP 6 mmHg.  EF 45 to 50%.  RECOMMENDATIONS:   Aspirin and Plavix for 1 year.  IV nitroglycerin discontinued.  Check hemoglobin A1c.  Aggressive statin therapy for risk reduction.  Smoking cessation  Potential discharge in 24 to 36 hours depending upon clinical course.  Findings Coronary Findings Diagnostic  Dominance: Right  Left Anterior Descending Prox LAD to Mid LAD lesion is 40% stenosed. Mid LAD lesion is 70% stenosed.  Second Diagonal Branch 2nd Diag lesion is 35% stenosed.  Left Circumflex  First Obtuse Marginal Branch Vessel is small in size.  Second Obtuse Marginal Branch Vessel is large in size. 2nd Mrg lesion is 60% stenosed.  Right Coronary Artery  Right Posterior Descending Artery Collaterals RPDA filled by collaterals from Dist LAD.  RPDA-1 lesion is 40% stenosed. RPDA-2 lesion is 100% stenosed.  Intervention  RPDA-2 lesion Stent A stent was successfully placed. Post-Intervention Lesion Assessment The intervention was successful. There is a 0% residual stenosis post intervention.   STRESS TESTS  NM PET CT CARDIAC PERFUSION MULTI W/ABSOLUTE BLOODFLOW 11/03/2023  Narrative   Findings are consistent with ischemia in the distribution of a distal oblique marginal branch of the left circumflex  coronary artery or a posterolateral ventricular artery branch of the right coronary artery. The study is intermediate risk.   LV perfusion is abnormal. There is evidence of ischemia. There is no evidence of infarction. Defect 1: There is a medium defect with moderate reduction in uptake present in the mid to basal inferolateral location(s) that is reversible. There is normal wall motion in the defect area. Consistent with ischemia. The defect is consistent with abnormal perfusion in the LCx or RCA territories.   Rest left ventricular function is normal. Rest EF: 51%. Stress left ventricular function is normal. Stress EF: 58%. End diastolic cavity size is normal. End systolic cavity size is normal.   Myocardial blood flow was computed to be 0.58ml/g/min at rest and 1.44ml/g/min at stress. Global myocardial blood flow reserve was 2.26 and was normal. MBF reserve was reduced in the left circumflex artery territory (1.71), particularly reduced in the area of the reversible perfusion defect (1.24)   Coronary calcium assessment not performed due to prior revascularization.   Electronically signed by Thurmon Fair, MD.  EXAM: OVER-READ INTERPRETATION  PET-CT CHEST  The following report is an over-read performed by radiologist Dr. Leatha [facility] [place] on 11/04/2023. This over-read does not include interpretation of cardiac or coronary anatomy or pathology. The cardiac PET and cardiac CT interpretation by the cardiologist is to be attached.  COMPARISON:  None.  FINDINGS: No evidence for lymphadenopathy within the visualized mediastinum or hilar  regions.  10 mm ground-glass opacity identified right lower lobe on image 21/4.  Visualized portions of the upper abdomen are unremarkable.  No suspicious lytic or sclerotic osseous abnormality.  IMPRESSION: 10 mm ground-glass opacity right lower lobe. Initial follow-up with CT at 6-12 months is recommended to confirm persistence.  If persistent, repeat CT is recommended every 2 years until 5 years of stability has been established. This recommendation follows the consensus statement: Guidelines for Management of Incidental Pulmonary Nodules Detected on CT Images: From the Fleischner Society 2017; Radiology 2017; 284:228-243.   Electronically Signed By: Kennith Center M.D. On: 11/04/2023 09:09   ECHOCARDIOGRAM  ECHOCARDIOGRAM COMPLETE 09/06/2022  Narrative ECHOCARDIOGRAM REPORT    Patient Name:   [name] Date of Exam: 09/06/2022 Medical Rec #:  [id number]       Height:       68.0 in Accession #:    [id number]      Weight:       258.6 lb Date of Birth:  October 25, 1961      BSA:          2.280 m Patient Age:    60 years        BP:           124/58 mmHg Patient Gender: M               HR:           88 bpm. Exam Location:  Inpatient  Procedure: 2D Echo  Indications:    atrial flutter  History:        Patient has prior history of Echocardiogram examinations, most recent 09/20/2020. CAD; Risk Factors:Diabetes, Hypertension and Dyslipidemia.  Sonographer:    Delcie Roch RDCS Referring Phys: [id number] Karl Ito   Sonographer Comments: Technically difficult study due to poor echo windows. IMPRESSIONS   1. Left ventricular ejection fraction, by estimation, is 60 to 65%. The left ventricle has normal function. The left ventricle has no regional wall motion abnormalities. There is mild left ventricular hypertrophy. Left ventricular diastolic parameters are indeterminate. 2. Right ventricular systolic function is normal. The right ventricular size is normal. 3. Left atrial size was mildly dilated. 4. The mitral valve is normal in structure. No evidence of mitral valve regurgitation. No evidence of mitral stenosis. 5. The aortic valve was not well visualized. Aortic valve regurgitation is not visualized. No aortic stenosis is present. 6. The inferior vena cava is normal in size with greater than  50% respiratory variability, suggesting right atrial pressure of 3 mmHg.  FINDINGS Left Ventricle: Left ventricular ejection fraction, by estimation, is 60 to 65%. The left ventricle has normal function. The left ventricle has no regional wall motion abnormalities. The left ventricular internal cavity size was normal in size. There is mild left ventricular hypertrophy. Left ventricular diastolic parameters are indeterminate.  Right Ventricle: The right ventricular size is normal. No increase in right ventricular wall thickness. Right ventricular systolic function is normal.  Left Atrium: Left atrial size was mildly dilated.  Right Atrium: Right atrial size was normal in size.  Pericardium: There is no evidence of pericardial effusion.  Mitral Valve: The mitral valve is normal in structure. No evidence of mitral valve regurgitation. No evidence of mitral valve stenosis.  Tricuspid Valve: The tricuspid valve is normal in structure. Tricuspid valve regurgitation is not demonstrated. No evidence of tricuspid stenosis.  Aortic Valve: The aortic valve was not well visualized. Aortic valve regurgitation is not visualized. No aortic stenosis  is present.  Pulmonic Valve: The pulmonic valve was normal in structure. Pulmonic valve regurgitation is not visualized. No evidence of pulmonic stenosis.  Aorta: The aortic root is normal in size and structure.  Venous: The inferior vena cava is normal in size with greater than 50% respiratory variability, suggesting right atrial pressure of 3 mmHg.  IAS/Shunts: No atrial level shunt detected by color flow Doppler.   LEFT VENTRICLE PLAX 2D LVIDd:         4.40 cm LVIDs:         2.80 cm LV PW:         1.40 cm LV IVS:        1.40 cm LVOT diam:     1.90 cm LV SV:         44 LV SV Index:   19 LVOT Area:     2.84 cm   RIGHT VENTRICLE         IVC TAPSE (M-mode): 1.9 cm  IVC diam: 1.60 cm  LEFT ATRIUM             Index        RIGHT ATRIUM            Index LA diam:        4.00 cm 1.75 cm/m   RA Area:     15.20 cm LA Vol (A2C):   53.9 ml 23.64 ml/m  RA Volume:   37.70 ml  16.54 ml/m LA Vol (A4C):   77.3 ml 33.91 ml/m LA Biplane Vol: 67.5 ml 29.61 ml/m AORTIC VALVE LVOT Vmax:   79.40 cm/s LVOT Vmean:  52.000 cm/s LVOT VTI:    0.155 m  AORTA Ao Root diam: 3.50 cm  MITRAL VALVE MV Area (PHT): 4.60 cm     SHUNTS MV Decel Time: 165 msec     Systemic VTI:  0.16 m MV E velocity: 108.00 cm/s  Systemic Diam: 1.90 cm MV A velocity: 61.90 cm/s MV E/A ratio:  1.74  Charlton Haws MD Electronically signed by Charlton Haws MD Signature Date/Time: 09/06/2022/11:57:08 AM    Final              Laboratory Data:  High Sensitivity Troponin:  No results for input(s): "TROPONINIHS" in the last 720 hours.    ChemistryNo results for input(s): "NA", "K", "CL", "CO2", "GLUCOSE", "BUN", "CREATININE", "CALCIUM", "MG", "GFRNONAA", "GFRAA", "ANIONGAP" in the last 168 hours.  No results for input(s): "PROT", "ALBUMIN", "AST", "ALT", "ALKPHOS", "BILITOT" in the last 168 hours. Lipids No results for input(s): "CHOL", "TRIG", "HDL", "LABVLDL", "LDLCALC", "CHOLHDL" in the last 168 hours. Hematology Recent Labs  Lab 11/20/23 1022  WBC 14.9*  RBC 5.36  HGB 15.0  HCT 45.6  MCV 85.1  MCH 28.0  MCHC 32.9  RDW 13.6  PLT 263   Thyroid No results for input(s): "TSH", "FREET4" in the last 168 hours. BNPNo results for input(s): "BNP", "PROBNP" in the last 168 hours.  DDimer No results for input(s): "DDIMER" in the last 168 hours.   Radiology/Studies:  DG Chest Port 1 View  Result Date: 11/20/2023 CLINICAL DATA:  Chest pain. EXAM: PORTABLE CHEST 1 VIEW COMPARISON:  09/14/2022. FINDINGS: Bilateral lung fields are clear. Bilateral costophrenic angles are clear. Normal cardio-mediastinal silhouette. No acute osseous abnormalities. The soft tissues are within normal limits. IMPRESSION: No active disease. Electronically Signed   By: Jules Schick M.D.    On: 11/20/2023 11:33     Assessment and Plan:   Coronary Artery Disease    -  Daily episodes of chest pain, similar to previous angina. Recent stress test showed ischemia in the distribution of distal oblique marginal branch of the left circumflex artery or posterolateral ventricular artery branch of the RCA. EKG in office did not show acute changes.  PET stress test was abnormal recently.  -Will proceed with cardiac catheterization.  The risk and the benefit of the procedure has been discussed with the patient, he is agreeable to proceed.  Atrial Flutter - Status post ablation with no recurrence of arrhythmia. Eliquis was discontinued since September 2024.   Hyperlipidemia - Cholesterol well controlled as of last check in September 2024. -Continue current management.  Diabetes Mellitus -Poorly controlled with most recent HbA1c at 8.5.   Pulmonary Nodule - 10mm ground glass opacity in the right lower lobe noted on extracardiac portion of recent stress test. -Repeat CT recommended in 6-12 months.  Abdominal Discomfort -Patient reported abdominal bloating and burping the previous night.  7. Leukocytosis: continue to trend  Informed Consent Shared Decision Making/Informed Consent The risks [stroke (1 in 1000), death (1 in 1000), kidney failure [usually temporary] (1 in 500), bleeding (1 in 200), allergic reaction [possibly serious] (1 in 200)], benefits (diagnostic support and management of coronary artery disease) and alternatives of a cardiac catheterization were discussed in detail with Mr. Gery and he is willing to proceed.   Risk Assessment/Risk Scores:    TIMI Risk Score for Unstable Angina or Non-ST Elevation MI:   The patient's TIMI risk score is 4, which indicates a 20% risk of all cause mortality, new or recurrent myocardial infarction or need for urgent revascularization in the next 14 days.      Code Status: Full Code  Severity of Illness: The appropriate  patient status for this patient is OBSERVATION. Observation status is judged to be reasonable and necessary in order to provide the required intensity of service to ensure the patient's safety. The patient's presenting symptoms, physical exam findings, and initial radiographic and laboratory data in the context of their medical condition is felt to place them at decreased risk for further clinical deterioration. Furthermore, it is anticipated that the patient will be medically stable for discharge from the hospital within 2 midnights of admission.    For questions or updates, please contact [facility] Please consult [email or web address] for contact info under     Signed, Azalee Course, PA  11/20/2023 12:25 PM   History and all data above reviewed.  Patient examined.  I agree with the findings as above.  The patient presents with chest discomfort as above.  This actually has been getting worse in the last couple of days.  He is now having this at rest and currently reports chest discomfort.  This is similar to his previous angina.  Has been taking nitroglycerin.  There are no acute EKG changes.  I did review his most recent catheterization films.  He has residual disease as described.  He is not having any new palpitations, presyncope or syncope.  He has been having some shortness of breath.  The patient exam reveals COR: Regular rate and rhythm, no murmurs,  Lungs: Clear to auscultation bilaterally,  Abd: Positive bowel sounds normal frequency in pitch, Ext 2+ pulses, no edema.  All available labs, radiology testing, previous records reviewed. Agree with documented assessment and plan.  Chest pain: This is unstable angina.  He will be taken by ambulance to the hospital.  The plan pending his labs and his cardiac catheterization today.  Rollene Rotunda  12:29 PM  11/20/2023

## 2023-11-20 NOTE — Progress Notes (Signed)
Cardiology Office Note:  .   Date:  11/20/2023  ID:  Jerry Shaffer, DOB 1961/01/03, MRN [id number] PCP: Patient, No Pcp Per  [facility] Providers Cardiologist:  Thurmon Fair, MD Electrophysiologist:  Maurice Small, MD    History of Present Illness: .   Jerry Jerry Shaffer is a 62 y.o. male with PMH of CAD, ADHD, chronic smoker, migraine headaches, atrial flutter s/p ablation by Dr. Nelly Laurence 9/23, hypertension, hyperlipidemia, and DM2.  Patient had inferior STEMI on 09/20/2020 due to occlusion of mid PDA treated with DES.  He returned back to the hospital in April 2022 with unstable angina and received another DES to proximal LAD.  Patient had moderate residual disease in mid LAD and mild disease in diagonal and 50% mid left circumflex lesion.  Since her atrial flutter ablation, she has not had any recurrence of her arrhythmia.  Echocardiogram in September 2023 showed EF 60 to 65%, mild LVH, no significant valve issue. Myoview in October 2023 showed old inferior and inferolateral scar, no ischemia, EF 53%. He has frequent episode of retrosternal discomfort that improved with sublingual nitroglycerin.  Symptom is not necessarily brought on by exertion.  Patient was seen by Dr. Royann Shivers in September 2024, given lack of recurrence of atrial flutter, Eliquis was discontinued and he was restarted on 81 mg aspirin.  Cholesterol is very well-controlled in September.  Diabetes is poorly controlled, most recent hemoglobin A1c was 8.5.  PET stress test obtained on 11/03/2023 showed ischemia in the distribution of distal oblique marginal branch of the left circumflex artery or posterolateral ventricular artery branch of the RCA, the study was intermediate risk.  There was no evidence of infarction.  EF 51%.  Extracardiac portion showed a 10 mm groundglass opacity in the right lower lobe, repeat CT recommended in 6 to 12 months.  Patient presents today for evaluation of chest pain.  He says he has been  having daily episodes of the chest pain.  On his way here, he started having chest pain again, currently have 7 out of 10 chest pain.  He is diaphoretic on exam.  Initial blood pressure was 160/90.  Even on repeat manual recheck by myself, blood pressure remaining at 160/90.  His symptom is very concerning for unstable angina.  He did mention abdominal bloating and burping last night.  However his primary symptom is substernal chest pain.  The symptom is reminiscent of the previous angina.  EKG does not show acute changes.  However given active chest pain, I discussed the case with Dr. Antoine Poche, we recommend EMS transport to [facility], ED for blood work.  Cardiology service will see the patient and taken for cardiac catheterization today.  I do not think he can wait until next week for outpatient cath.  ROS:   Patient complains of active chest pain, chronic dyspnea on exertion.  Denies any orthopnea, PND.  Studies Reviewed: Marland Kitchen   EKG Interpretation Date/Time:  Friday November 20 2023 08:23:35 EST Ventricular Rate:  88 PR Interval:  162 QRS Duration:  84 QT Interval:  374 QTC Calculation: 452 R Axis:   26  Text Interpretation: Normal sinus rhythm Normal ECG When compared with ECG of 21-Aug-2023 08:47, No significant change was found Confirmed by Azalee Course [phone]) on 11/20/2023 9:30:30 AM    Cardiac Studies & Procedures   CARDIAC CATHETERIZATION  CARDIAC CATHETERIZATION 03/28/2021  Narrative 1.  Patent left main 2.  Severe stenosis of the proximal LAD, new from cardiac catheterization 6  months ago, treated successfully with PCI using a 3.5 x 20 mm Synergy DES.  The mid LAD is unchanged from the prior study with Medina 0, 1, 1 bifurcation disease with only moderate stenosis in the LAD and mild stenosis in the diagonal 3.  Moderate 50% mid circumflex stenosis 4.  Continued patency of the right PDA stent with mild diffuse nonobstructive PDA and RCA stenosis 5.  Normal LVEDP  Recommendations:  Continue dual antiplatelet therapy with aspirin and clopidogrel at least 6 months without interruption.  Should be eligible for hospital discharge tomorrow.  Findings Coronary Findings Diagnostic  Dominance: Right  Left Main There is mild diffuse disease throughout the vessel.  Left Anterior Descending There is mild diffuse disease throughout the vessel. Prox LAD to Mid LAD lesion is 80% stenosed. The lesion is eccentric. The lesion is mildly calcified. This lesion shows marked change from the prior cardiac catheterization 6 months ago, now clearly a very severe stenosis Mid LAD lesion is 60% stenosed.  Second Diagonal Branch 2nd Diag lesion is 35% stenosed.  Left Circumflex There is mild diffuse disease throughout the vessel.  First Obtuse Marginal Branch Vessel is small in size.  Second Obtuse Marginal Branch Vessel is large in size. 2nd Mrg lesion is 60% stenosed.  Right Coronary Artery There is mild diffuse disease throughout the vessel.  Right Posterior Descending Artery Collaterals RPDA filled by collaterals from Dist LAD.  RPDA-1 lesion is 40% stenosed. Previously placed RPDA-2 stent (unknown type) is widely patent. The previously implanted stent is widely patent with no significant in-stent restenosis  Intervention  Prox LAD to Mid LAD lesion Stent CATH LAUNCHER 66F EBU4.0 guide catheter was inserted. Lesion crossed with guidewire using a WIRE COUGAR XT STRL 190CM. Pre-stent angioplasty was performed using a BALLOON SAPPHIRE 2.5X12. A drug-eluting stent was successfully placed using a SYNERGY XD 3.50X20. Maximum pressure: 12 atm. Post-stent angioplasty was performed using a BALLOON SAPPHIRE [place] [id number]. Maximum pressure:  18 atm. Post-Intervention Lesion Assessment The intervention was successful. Pre-interventional TIMI flow is 3. Post-intervention TIMI flow is 3. No complications occurred at this lesion. There is a 0% residual stenosis post  intervention.   CARDIAC CATHETERIZATION  CARDIAC CATHETERIZATION 09/20/2020  Narrative  Total occlusion of the mid PDA (a large vessel that extends to the left ventricular apex), filling sluggishly by apical LAD to PDA collaterals.  This vessel is felt to represent the culprit for the patient's presentation.  Total occlusion of the mid PDA reduced to 0% using an 18 x 2.75 mm Onyx deployed at 12 atm x 2 inflations.  TIMI grade III flow was noted.  Resolution of chest pain occurred.  Proximal to the stented segment there is a 30 to 40% stenosis in the PDA.  Left main is widely patent  LAD contains mid vessel disease with a Medina 011 bifurcation stenosis.  LAD is 70% and diagonal is 40%.  This can be managed medically.  Circumflex contains segmental 40 to 50% stenosis in the large second obtuse marginal.  RCA is dominant, tortuous, and no significant disease other than that noted above in the PDA.  Mid inferior wall to apical akinesis.  LVEDP 6 mmHg.  EF 45 to 50%.  RECOMMENDATIONS:   Aspirin and Plavix for 1 year.  IV nitroglycerin discontinued.  Check hemoglobin A1c.  Aggressive statin therapy for risk reduction.  Smoking cessation  Potential discharge in 24 to 36 hours depending upon clinical course.  Findings Coronary Findings Diagnostic  Dominance: Right  Left  Anterior Descending Prox LAD to Mid LAD lesion is 40% stenosed. Mid LAD lesion is 70% stenosed.  Second Diagonal Branch 2nd Diag lesion is 35% stenosed.  Left Circumflex  First Obtuse Marginal Branch Vessel is small in size.  Second Obtuse Marginal Branch Vessel is large in size. 2nd Mrg lesion is 60% stenosed.  Right Coronary Artery  Right Posterior Descending Artery Collaterals RPDA filled by collaterals from Dist LAD.  RPDA-1 lesion is 40% stenosed. RPDA-2 lesion is 100% stenosed.  Intervention  RPDA-2 lesion Stent A stent was successfully placed. Post-Intervention Lesion  Assessment The intervention was successful. There is a 0% residual stenosis post intervention.   STRESS TESTS  NM PET CT CARDIAC PERFUSION MULTI W/ABSOLUTE BLOODFLOW 11/03/2023  Narrative   Findings are consistent with ischemia in the distribution of a distal oblique marginal branch of the left circumflex coronary artery or a posterolateral ventricular artery branch of the right coronary artery. The study is intermediate risk.   LV perfusion is abnormal. There is evidence of ischemia. There is no evidence of infarction. Defect 1: There is a medium defect with moderate reduction in uptake present in the mid to basal inferolateral location(s) that is reversible. There is normal wall motion in the defect area. Consistent with ischemia. The defect is consistent with abnormal perfusion in the LCx or RCA territories.   Rest left ventricular function is normal. Rest EF: 51%. Stress left ventricular function is normal. Stress EF: 58%. End diastolic cavity size is normal. End systolic cavity size is normal.   Myocardial blood flow was computed to be 0.35ml/g/min at rest and 1.23ml/g/min at stress. Global myocardial blood flow reserve was 2.26 and was normal. MBF reserve was reduced in the left circumflex artery territory (1.71), particularly reduced in the area of the reversible perfusion defect (1.24)   Coronary calcium assessment not performed due to prior revascularization.   Electronically signed by Thurmon Fair, MD.  EXAM: OVER-READ INTERPRETATION  PET-CT CHEST  The following report is an over-read performed by radiologist Dr. Leatha Gilding [facility] on 11/04/2023. This over-read does not include interpretation of cardiac or coronary anatomy or pathology. The cardiac PET and cardiac CT interpretation by the cardiologist is to be attached.  COMPARISON:  None.  FINDINGS: No evidence for lymphadenopathy within the visualized mediastinum or hilar regions.  10 mm ground-glass  opacity identified right lower lobe on image 21/4.  Visualized portions of the upper abdomen are unremarkable.  No suspicious lytic or sclerotic osseous abnormality.  IMPRESSION: 10 mm ground-glass opacity right lower lobe. Initial follow-up with CT at 6-12 months is recommended to confirm persistence. If persistent, repeat CT is recommended every 2 years until 5 years of stability has been established. This recommendation follows the consensus statement: Guidelines for Management of Incidental Pulmonary Nodules Detected on CT Images: From the Fleischner Society 2017; Radiology 2017; 284:228-243.   Electronically Signed By: Kennith Center M.D. On: 11/04/2023 09:09   ECHOCARDIOGRAM  ECHOCARDIOGRAM COMPLETE 09/06/2022  Narrative ECHOCARDIOGRAM REPORT    Patient Name:   Jerry Shaffer Date of Exam: 09/06/2022 Medical Rec #:  [id number]       Height:       68.0 in Accession #:    [id number]      Weight:       258.6 lb Date of Birth:  Feb 23, 1961      BSA:          2.280 m Patient Age:    67 years  BP:           124/58 mmHg Patient Gender: M               HR:           88 bpm. Exam Location:  Inpatient  Procedure: 2D Echo  Indications:    atrial flutter  History:        Patient has prior history of Echocardiogram examinations, most recent 09/20/2020. CAD; Risk Factors:Diabetes, Hypertension and Dyslipidemia.  Sonographer:    Delcie Roch RDCS Referring Phys: [id number] Karl Ito   Sonographer Comments: Technically difficult study due to poor echo windows. IMPRESSIONS   1. Left ventricular ejection fraction, by estimation, is 60 to 65%. The left ventricle has normal function. The left ventricle has no regional wall motion abnormalities. There is mild left ventricular hypertrophy. Left ventricular diastolic parameters are indeterminate. 2. Right ventricular systolic function is normal. The right ventricular size is normal. 3. Left atrial size was mildly  dilated. 4. The mitral valve is normal in structure. No evidence of mitral valve regurgitation. No evidence of mitral stenosis. 5. The aortic valve was not well visualized. Aortic valve regurgitation is not visualized. No aortic stenosis is present. 6. The inferior vena cava is normal in size with greater than 50% respiratory variability, suggesting right atrial pressure of 3 mmHg.  FINDINGS Left Ventricle: Left ventricular ejection fraction, by estimation, is 60 to 65%. The left ventricle has normal function. The left ventricle has no regional wall motion abnormalities. The left ventricular internal cavity size was normal in size. There is mild left ventricular hypertrophy. Left ventricular diastolic parameters are indeterminate.  Right Ventricle: The right ventricular size is normal. No increase in right ventricular wall thickness. Right ventricular systolic function is normal.  Left Atrium: Left atrial size was mildly dilated.  Right Atrium: Right atrial size was normal in size.  Pericardium: There is no evidence of pericardial effusion.  Mitral Valve: The mitral valve is normal in structure. No evidence of mitral valve regurgitation. No evidence of mitral valve stenosis.  Tricuspid Valve: The tricuspid valve is normal in structure. Tricuspid valve regurgitation is not demonstrated. No evidence of tricuspid stenosis.  Aortic Valve: The aortic valve was not well visualized. Aortic valve regurgitation is not visualized. No aortic stenosis is present.  Pulmonic Valve: The pulmonic valve was normal in structure. Pulmonic valve regurgitation is not visualized. No evidence of pulmonic stenosis.  Aorta: The aortic root is normal in size and structure.  Venous: The inferior vena cava is normal in size with greater than 50% respiratory variability, suggesting right atrial pressure of 3 mmHg.  IAS/Shunts: No atrial level shunt detected by color flow Doppler.   LEFT VENTRICLE PLAX 2D LVIDd:          4.40 cm LVIDs:         2.80 cm LV PW:         1.40 cm LV IVS:        1.40 cm LVOT diam:     1.90 cm LV SV:         44 LV SV Index:   19 LVOT Area:     2.84 cm   RIGHT VENTRICLE         IVC TAPSE (M-mode): 1.9 cm  IVC diam: 1.60 cm  LEFT ATRIUM             Index        RIGHT ATRIUM  Index LA diam:        4.00 cm 1.75 cm/m   RA Area:     15.20 cm LA Vol (A2C):   53.9 ml 23.64 ml/m  RA Volume:   37.70 ml  16.54 ml/m LA Vol (A4C):   77.3 ml 33.91 ml/m LA Biplane Vol: 67.5 ml 29.61 ml/m AORTIC VALVE LVOT Vmax:   79.40 cm/s LVOT Vmean:  52.000 cm/s LVOT VTI:    0.155 m  AORTA Ao Root diam: 3.50 cm  MITRAL VALVE MV Area (PHT): 4.60 cm     SHUNTS MV Decel Time: 165 msec     Systemic VTI:  0.16 m MV E velocity: 108.00 cm/s  Systemic Diam: 1.90 cm MV A velocity: 61.90 cm/s MV E/A ratio:  1.74  Charlton Haws MD Electronically signed by Charlton Haws MD Signature Date/Time: 09/06/2022/11:57:08 AM    Final             Risk Assessment/Calculations:    CHA2DS2-VASc Score = 3   This indicates a 3.2% annual risk of stroke. The patient's score is based upon: CHF History: 0 HTN History: 1 Diabetes History: 1 Stroke History: 0 Vascular Disease History: 1 Age Score: 0 Gender Score: 0            Physical Exam:   VS:  BP 134/86   Pulse 90   Ht 5\' 8"  (1.727 m)   Wt 244 lb (110.7 kg)   SpO2 96%   BMI 37.10 kg/m    Wt Readings from Last 3 Encounters:  11/20/23 244 lb (110.7 kg)  10/27/23 249 lb (112.9 kg)  08/21/23 242 lb 3.2 oz (109.9 kg)    GEN: Well nourished, well developed in no acute distress NECK: No JVD; No carotid bruits CARDIAC: RRR, no murmurs, rubs, gallops RESPIRATORY:  Clear to auscultation without rales, wheezing or rhonchi  ABDOMEN: Soft, non-tender, non-distended EXTREMITIES:  No edema; No deformity   ASSESSMENT AND PLAN: .     Coronary Artery Disease Daily episodes of chest pain, similar to previous angina. Recent stress  test showed ischemia in the distribution of distal oblique marginal branch of the left circumflex artery or posterolateral ventricular artery branch of the RCA. EKG in office did not show acute changes.  PET stress test was abnormal recently. -EMS transport to ED for blood work. -Cardiology service to evaluate for cardiac catheterization today.  I do not think the patient can wait until next week for outpatient cath.  Atrial Flutter Status post ablation with no recurrence of arrhythmia. Eliquis was discontinued since September 2024. -No changes to current management.  Hyperlipidemia Cholesterol well controlled as of last check in September 2024. -Continue current management.  Diabetes Mellitus Poorly controlled with most recent HbA1c at 8.5. -No changes to current management discussed.  Pulmonary Nodule 10mm ground glass opacity in the right lower lobe noted on extracardiac portion of recent stress test. -Repeat CT recommended in 6-12 months.  Abdominal Discomfort Patient reported abdominal bloating and burping the previous night.    Informed Consent   Shared Decision Making/Informed Consent The risks [stroke (1 in 1000), death (1 in 1000), kidney failure [usually temporary] (1 in 500), bleeding (1 in 200), allergic reaction [possibly serious] (1 in 200)], benefits (diagnostic support and management of coronary artery disease) and alternatives of a cardiac catheterization were discussed in detail with Mr. Martines and he is willing to proceed.     Dispo: Follow-up depend on result on cath.  Signed, Azalee Course, PA

## 2023-11-21 ENCOUNTER — Other Ambulatory Visit (HOSPITAL_COMMUNITY): Payer: Self-pay

## 2023-11-21 ENCOUNTER — Encounter: Payer: Self-pay | Admitting: Physician Assistant

## 2023-11-21 DIAGNOSIS — I25110 Atherosclerotic heart disease of native coronary artery with unstable angina pectoris: Secondary | ICD-10-CM | POA: Diagnosis not present

## 2023-11-21 DIAGNOSIS — Z8673 Personal history of transient ischemic attack (TIA), and cerebral infarction without residual deficits: Secondary | ICD-10-CM | POA: Diagnosis not present

## 2023-11-21 DIAGNOSIS — E119 Type 2 diabetes mellitus without complications: Secondary | ICD-10-CM | POA: Diagnosis not present

## 2023-11-21 DIAGNOSIS — I200 Unstable angina: Secondary | ICD-10-CM | POA: Diagnosis not present

## 2023-11-21 DIAGNOSIS — I10 Essential (primary) hypertension: Secondary | ICD-10-CM | POA: Diagnosis not present

## 2023-11-21 LAB — CBC
HCT: 40.8 % (ref 39.0–52.0)
Hemoglobin: 13.6 g/dL (ref 13.0–17.0)
MCH: 28.2 pg (ref 26.0–34.0)
MCHC: 33.3 g/dL (ref 30.0–36.0)
MCV: 84.5 fL (ref 80.0–100.0)
Platelets: 216 10*3/uL (ref 150–400)
RBC: 4.83 MIL/uL (ref 4.22–5.81)
RDW: 13.7 % (ref 11.5–15.5)
WBC: 16.9 10*3/uL — ABNORMAL HIGH (ref 4.0–10.5)
nRBC: 0 % (ref 0.0–0.2)

## 2023-11-21 LAB — BASIC METABOLIC PANEL
Anion gap: 10 (ref 5–15)
BUN: 10 mg/dL (ref 8–23)
CO2: 22 mmol/L (ref 22–32)
Calcium: 8.6 mg/dL — ABNORMAL LOW (ref 8.9–10.3)
Chloride: 103 mmol/L (ref 98–111)
Creatinine, Ser: 0.77 mg/dL (ref 0.61–1.24)
GFR, Estimated: >60 mL/min (ref >60)
Glucose, Bld: 246 mg/dL — ABNORMAL HIGH (ref 70–99)
Potassium: 3.8 mmol/L (ref 3.5–5.1)
Sodium: 135 mmol/L (ref 135–145)

## 2023-11-21 MED ORDER — NITROGLYCERIN 0.4 MG SL SUBL
0.4000 mg | SUBLINGUAL_TABLET | SUBLINGUAL | 3 refills | Status: DC | PRN
  Filled 2023-11-21: qty 25, 15d supply, fill #0

## 2023-11-21 MED ORDER — CLOPIDOGREL BISULFATE 75 MG PO TABS
75.0000 mg | ORAL_TABLET | Freq: Every day | ORAL | 11 refills | Status: DC
  Filled 2023-11-21: qty 30, 30d supply, fill #0

## 2023-11-21 NOTE — Plan of Care (Signed)
   Problem: Education: Goal: Knowledge of General Education information will improve Description: Including pain rating scale, medication(s)/side effects and non-pharmacologic comfort measures Outcome: Progressing   Problem: Health Behavior/Discharge Planning: Goal: Ability to manage health-related needs will improve Outcome: Progressing   Problem: Clinical Measurements: Goal: Ability to maintain clinical measurements within normal limits will improve Outcome: Progressing Goal: Will remain free from infection Outcome: Progressing Goal: Diagnostic test results will improve Outcome: Progressing Goal: Respiratory complications will improve Outcome: Progressing Goal: Cardiovascular complication will be avoided Outcome: Progressing   Problem: Activity: Goal: Risk for activity intolerance will decrease Outcome: Progressing   Problem: Nutrition: Goal: Adequate nutrition will be maintained Outcome: Progressing   Problem: Coping: Goal: Level of anxiety will decrease Outcome: Progressing   Problem: Elimination: Goal: Will not experience complications related to bowel motility Outcome: Progressing Goal: Will not experience complications related to urinary retention Outcome: Progressing   Problem: Pain Management: Goal: General experience of comfort will improve Outcome: Progressing   Problem: Safety: Goal: Ability to remain free from injury will improve Outcome: Progressing   Problem: Skin Integrity: Goal: Risk for impaired skin integrity will decrease Outcome: Progressing   Problem: Education: Goal: Understanding of cardiac disease, CV risk reduction, and recovery process will improve Outcome: Progressing Goal: Individualized Educational Video(s) Outcome: Progressing   Problem: Activity: Goal: Ability to tolerate increased activity will improve Outcome: Progressing   Problem: Cardiac: Goal: Ability to achieve and maintain adequate cardiovascular perfusion will  improve Outcome: Progressing   Problem: Health Behavior/Discharge Planning: Goal: Ability to safely manage health-related needs after discharge will improve Outcome: Progressing   Problem: Education: Goal: Understanding of CV disease, CV risk reduction, and recovery process will improve Outcome: Progressing Goal: Individualized Educational Video(s) Outcome: Progressing   Problem: Activity: Goal: Ability to return to baseline activity level will improve Outcome: Progressing   Problem: Cardiovascular: Goal: Ability to achieve and maintain adequate cardiovascular perfusion will improve Outcome: Progressing Goal: Vascular access site(s) Level 0-1 will be maintained Outcome: Progressing   Problem: Health Behavior/Discharge Planning: Goal: Ability to safely manage health-related needs after discharge will improve Outcome: Progressing

## 2023-11-21 NOTE — Discharge Summary (Addendum)
Discharge Summary    Patient ID: Jerry Shaffer MRN: [id number]; DOB: 1961-06-13  Admit date: 11/20/2023 Discharge date: 11/21/2023  PCP:  Patient, No Pcp Per   [facility] Providers Cardiologist:  Thurmon Fair, MD  Electrophysiologist:  Maurice Small, MD       Discharge Diagnoses    Principal Problem:   Unstable angina [facility])    Diagnostic Studies/Procedures    CARDIAC CATH: 11/20/2023 1.  Patent left main with no significant stenosis 2.  Patent LAD with a widely patent proximal LAD stent site and no significant restenosis.  The mid LAD bifurcation disease is unchanged with nonobstructive plaquing involving a 35% stenosis at the ostium of the diagonal and a 50% stenosis in the LAD 3.  Progressive and severe stenosis of the second OM Jaryn Rosko of the left circumflex, reduced from 99 to 0% with a 2.5 x 38 mm Synergy DES 4.  Large, dominant RCA with mild nonobstructive plaquing and patent stent in the PDA unchanged from the previous study 5.  Normal LVEDP   Recommendations: DAPT with aspirin and clopidogrel x 12 months, aggressive risk reduction measures.  Okay for hospital discharge tomorrow morning as long as no complications arise. Diagnostic Dominance: Right  Intervention    _____________   History of Present Illness     Jerry Shaffer is a 62 y.o. male with PMH of CAD, ADHD, chronic smoker, migraine headaches, atrial flutter s/p ablation by Dr. Nelly Laurence 9/23, hypertension, hyperlipidemia, and DM2.   Patient had inferior STEMI on 09/20/2020 due to occlusion of mid PDA treated with DES. He returned back to the hospital in April 2022 with unstable angina and received another DES to proximal LAD. Patient had moderate residual disease in mid LAD and mild disease in diagonal and 50% mid left circumflex lesion.   Since his atrial flutter ablation, he has not had any recurrence of arrhythmia. Echocardiogram in September 2023 showed EF 60 to 65%, mild LVH, no  significant valve issue. Myoview in October 2023 showed old inferior and inferolateral scar, no ischemia, EF 53%.   Recent PET scan on 11/03/2023   Findings are consistent with ischemia in the distribution of a distal oblique marginal Jaskarn Schweer of the left circumflex coronary artery or a posterolateral ventricular artery Dorthy Hustead of the right coronary artery. The study is intermediate risk.   LV perfusion is abnormal. There is evidence of ischemia. There is no evidence of infarction. Defect 1: There is a medium defect with moderate reduction in uptake present in the mid to basal inferolateral location(s) that is reversible. There is normal wall motion in the defect area. Consistent with ischemia. The defect is consistent with abnormal perfusion in the LCx or RCA territories.   Rest left ventricular function is normal. Rest EF: 51%. Stress left ventricular function is normal. Stress EF: 58%. End diastolic cavity size is normal. End systolic cavity size is normal.   Myocardial blood flow was computed to be 0.68ml/g/min at rest and 1.70ml/g/min at stress. Global myocardial blood flow reserve was 2.26 and was normal. MBF reserve was reduced in the left circumflex artery territory (1.71), particularly reduced in the area of the reversible perfusion defect (1.24)   Coronary calcium assessment not performed due to prior revascularization.   Electronically signed by Thurmon Fair, MD. RADIOLOGY IMPRESSION: 10 mm ground-glass opacity right lower lobe. Initial follow-up with CT at 6-12 months is recommended to confirm persistence. If persistent, repeat CT is recommended every 2 years until 5 years of  stability has been established. This recommendation follows the consensus statement: Guidelines for Management of Incidental Pulmonary Nodules Detected on CT Images  He came to the ER 12/06 having daily episodes of CP. He was admitted and taken to the cath lab.    Hospital Course     Consultants: None   Cardiac  catheterization results are above. He had a DES to OM2, and tolerated the procedure well.  On 12/07, he was seen by Cardiac Rehab and ambulated w/out chest pain or SOB.   VS were stable. He was given information on the RLL opacity and the need to get a f/u CT 6-12 months. He does not smoke but used to vape, and his father died of lung CA (but may have had asbestos exposure). Therefore 6 month f/u recommended.   No further inpatient workup is indicated and he is considered stable for discharge, to follow up as an outpatient.     Did the patient have an acute coronary syndrome (MI, NSTEMI, STEMI, etc) this admission?:  No                               Did the patient have a percutaneous coronary intervention (stent / angioplasty)?:  Yes.     Cath/PCI Registry Performance & Quality Measures: Aspirin prescribed? - Yes ADP Receptor Inhibitor (Plavix/Clopidogrel, Brilinta/Ticagrelor or Effient/Prasugrel) prescribed (includes medically managed patients)? - Yes High Intensity Statin (Lipitor 40-80mg  or Crestor 20-40mg ) prescribed? - Yes For EF <40%, was ACEI/ARB prescribed? - Not Applicable (EF >/= 40%) For EF <40%, Aldosterone Antagonist (Spironolactone or Eplerenone) prescribed? - Not Applicable (EF >/= 40%) Cardiac Rehab Phase II ordered? - Yes   _____________  Discharge Vitals Blood pressure (!) 109/52, pulse 88, temperature 98.7 F (37.1 C), temperature source Oral, resp. rate 20, weight 109.7 kg, SpO2 94%.  Filed Weights   11/20/23 1026 11/20/23 2109  Weight: 110.7 kg 109.7 kg  Physical Exam Gen: well appearing Neuro: alert and oriented CV: r,r,r no murmurs.  Pulm: CLAB VASC: Right radial site clean and dry Abd: non distended Ext: No LE edema Skin: warm and well perfused Psych: normal mood  Labs & Radiologic Studies    CBC Recent Labs    11/20/23 1022 11/21/23 0259  WBC 14.9* 16.9*  NEUTROABS 13.1*  --   HGB 15.0 13.6  HCT 45.6 40.8  MCV 85.1 84.5  PLT 263 216    Basic Metabolic Panel Recent Labs    21/30/86 1022 11/21/23 0259  NA 136 135  K 4.0 3.8  CL 102 103  CO2 23 22  GLUCOSE 257* 246*  BUN 17 10  CREATININE 0.85 0.77  CALCIUM 9.1 8.6*   Liver Function Tests Recent Labs    11/20/23 1022  AST 15  ALT 20  ALKPHOS 101  BILITOT 1.0  PROT 6.4*  ALBUMIN 4.0   No results for input(s): "LIPASE", "AMYLASE" in the last 72 hours. High Sensitivity Troponin:   Recent Labs  Lab 11/20/23 1022 11/20/23 1332  TROPONINIHS 8 7     _____________  CARDIAC CATHETERIZATION  Result Date: 11/20/2023 1.  Patent left main with no significant stenosis 2.  Patent LAD with a widely patent proximal LAD stent site and no significant restenosis.  The mid LAD bifurcation disease is unchanged with nonobstructive plaquing involving a 35% stenosis at the ostium of the diagonal and a 50% stenosis in the LAD 3.  Progressive and severe stenosis of the  second OM Sundee Garland of the left circumflex, reduced from 99 to 0% with a 2.5 x 38 mm Synergy DES 4.  Large, dominant RCA with mild nonobstructive plaquing and patent stent in the PDA unchanged from the previous study 5.  Normal LVEDP Recommendations: DAPT with aspirin and clopidogrel x 12 months, aggressive risk reduction measures.  Okay for hospital discharge tomorrow morning as long as no complications arise.   DG Chest Port 1 View  Result Date: 11/20/2023 CLINICAL DATA:  Chest pain. EXAM: PORTABLE CHEST 1 VIEW COMPARISON:  09/14/2022. FINDINGS: Bilateral lung fields are clear. Bilateral costophrenic angles are clear. Normal cardio-mediastinal silhouette. No acute osseous abnormalities. The soft tissues are within normal limits. IMPRESSION: No active disease. Electronically Signed   By: Jules Schick M.D.   On: 11/20/2023 11:33   NM PET CT CARDIAC PERFUSION MULTI W/ABSOLUTE BLOODFLOW  Result Date: 11/04/2023   Findings are consistent with ischemia in the distribution of a distal oblique marginal Harace Mccluney of the  left circumflex coronary artery or a posterolateral ventricular artery Claretta Kendra of the right coronary artery. The study is intermediate risk.   LV perfusion is abnormal. There is evidence of ischemia. There is no evidence of infarction. Defect 1: There is a medium defect with moderate reduction in uptake present in the mid to basal inferolateral location(s) that is reversible. There is normal wall motion in the defect area. Consistent with ischemia. The defect is consistent with abnormal perfusion in the LCx or RCA territories.   Rest left ventricular function is normal. Rest EF: 51%. Stress left ventricular function is normal. Stress EF: 58%. End diastolic cavity size is normal. End systolic cavity size is normal.   Myocardial blood flow was computed to be 0.61ml/g/min at rest and 1.20ml/g/min at stress. Global myocardial blood flow reserve was 2.26 and was normal. MBF reserve was reduced in the left circumflex artery territory (1.71), particularly reduced in the area of the reversible perfusion defect (1.24)   Coronary calcium assessment not performed due to prior revascularization.   Electronically signed by Thurmon Fair, MD. EXAM: OVER-READ INTERPRETATION  PET-CT CHEST The following report is an over-read performed by radiologist Dr. Leatha Gilding [facility] on 11/04/2023. This over-read does not include interpretation of cardiac or coronary anatomy or pathology. The cardiac PET and cardiac CT interpretation by the cardiologist is to be attached. COMPARISON:  None. FINDINGS: No evidence for lymphadenopathy within the visualized mediastinum or hilar regions. 10 mm ground-glass opacity identified right lower lobe on image 21/4. Visualized portions of the upper abdomen are unremarkable. No suspicious lytic or sclerotic osseous abnormality. IMPRESSION: 10 mm ground-glass opacity right lower lobe. Initial follow-up with CT at 6-12 months is recommended to confirm persistence. If persistent, repeat CT  is recommended every 2 years until 5 years of stability has been established. This recommendation follows the consensus statement: Guidelines for Management of Incidental Pulmonary Nodules Detected on CT Images: From the Fleischner Society 2017; Radiology 2017; 284:228-243. Electronically Signed   By: Kennith Center M.D.   On: 11/04/2023 09:09  Disposition   Pt is being discharged home today in good condition.  Follow-up Plans & Appointments     Discharge Instructions     AMB Referral to Cardiac Rehabilitation - Phase II   Complete by: As directed    Diagnosis: Coronary Stents   After initial evaluation and assessments completed: Virtual Based Care may be provided alone or in conjunction with Phase 2 Cardiac Rehab based on patient barriers.: Yes  Intensive Cardiac Rehabilitation (ICR) [facility] location only OR Traditional Cardiac Rehabilitation (TCR) *If criteria for ICR are not met will enroll in TCR [facility] only): Yes   Call MD for:  redness, tenderness, or signs of infection (pain, swelling, redness, odor or green/yellow discharge around incision site)   Complete by: As directed    Diet - low sodium heart healthy   Complete by: As directed    Increase activity slowly   Complete by: As directed         Discharge Medications   Allergies as of 11/21/2023       Reactions   Bee Venom Anaphylaxis   Penicillins Anaphylaxis   Did it involve swelling of the face/tongue/throat, SOB, or low BP? yes Did it involve sudden or severe rash/hives, skin peeling, or any reaction on the inside of your mouth or nose? unknown Did you need to seek medical attention at a hospital or doctor's office? yes When did it last happen?      1967 If all above answers are "NO", may proceed with cephalosporin use.   Procaine Anaphylaxis   Isosorbide Other (See Comments)   Caused migraines   Tape Itching, Other (See Comments)   Certain medical tapes make the patient's skin ITCH        Medication List      TAKE these medications    albuterol 108 (90 Base) MCG/ACT inhaler Commonly known as: VENTOLIN HFA Inhale 1-2 puffs into the lungs every 6 (six) hours as needed for wheezing or shortness of breath.   aspirin EC 81 MG tablet Take 1 tablet (81 mg total) by mouth daily. Swallow whole.   atorvastatin 80 MG tablet Commonly known as: LIPITOR TAKE 1 TABLET BY MOUTH EVERY DAY   clopidogrel 75 MG tablet Commonly known as: PLAVIX Take 1 tablet (75 mg total) by mouth daily with breakfast.   glipiZIDE 5 MG tablet Commonly known as: GLUCOTROL Take 1 tablet (5 mg total) by mouth 2 (two) times daily before a meal.   metFORMIN 500 MG 24 hr tablet Commonly known as: GLUCOPHAGE-XR Take 4 tablets (2,000 mg total) by mouth daily.   methocarbamol 500 MG tablet Commonly known as: ROBAXIN Take 1 tablet (500 mg total) by mouth every 8 (eight) hours as needed for muscle spasms.   metoprolol tartrate 100 MG tablet Commonly known as: LOPRESSOR Take 1 tablet (100 mg total) by mouth 2 (two) times daily.   nitroGLYCERIN 0.4 MG SL tablet Commonly known as: NITROSTAT Place 1 tablet (0.4 mg total) under the tongue every 5 (five) minutes as needed for chest pain.   oxyCODONE 5 MG immediate release tablet Commonly known as: Roxicodone Take 1 tablet (5 mg total) by mouth every 4 (four) hours as needed for severe pain.           Outstanding Labs/Studies   None  Duration of Discharge Encounter   Greater than 30 minutes including physician time.  Signed, Theodore Demark, PA-C 11/21/2023, 10:01 AM

## 2023-11-21 NOTE — Progress Notes (Signed)
CARDIAC REHAB PHASE I   PRE:  Rate/Rhythm: 93 NSR  BP:  Sitting: 109/52      SaO2: 96/ RA  MODE:  Ambulation: 470 ft   POST:  Rate/Rhythm: 94 NSR  BP:  Sitting: 119/78      SaO2: 95/RA  Pt tolerated ambulation well and ambulated 470 ft with stand-by assist. Pt denies CP, SOB, or dizziness throughout walk. Pt educated on stent placement, risk factors, exercise guidelines, nitroglycerin use, restrictions and nutrition. Pt refused referral to CRPII. He states he will get enough exercise when he goes back to work. Pt was left sitting on the side of the bed with call bell in reach.  [id number] Guss Bunde, RRT 11/21/2023 8:49 AM

## 2023-11-23 ENCOUNTER — Encounter (HOSPITAL_COMMUNITY): Payer: Self-pay | Admitting: Cardiovascular Disease

## 2023-11-24 LAB — LIPOPROTEIN A (LPA): Lipoprotein (a): 200.1 nmol/L — ABNORMAL HIGH (ref <75.0)

## 2023-11-25 ENCOUNTER — Telehealth: Payer: Self-pay

## 2023-11-25 NOTE — Telephone Encounter (Signed)
-----   Message from Rollene Rotunda sent at 11/24/2023  9:04 PM EST ----- LPa is elevated.  There are no specific therapies at this time.  Plan is optimal management of his cholesterol as planned.  No change in therapy.  We will keep the patient in mind as future therapies are developed.

## 2023-11-30 NOTE — Progress Notes (Signed)
Cardiology Office Note:  .   Date:  12/08/2023  ID:  Jerry Shaffer, DOB 1961-04-17, MRN [id number] PCP: Patient, No Pcp Per   [facility] Prov[facility] Cardiologist: Thurmon Fair, MD  }   History of Present Illness: .   Jerry Shaffer is a 62 y.o. male we are seeing for posthospitalization follow-up after being admitted on 11/20/2023 for unstable angina.  Patient underwent cardiac catheterization on 11/20/2023 which revealed progressive severe stenosis of the second OM branch of the left circumflex.    The patient had a Synergy drug-eluting stent 2.5 x 38 mm deployed with 0% residual.  The LAD was widely patent along with the proximal stent site with no significant restenosis.  He had nonobstructive disease involving 35% at the ostium of the diagonal and 50% stenosis of the LAD.  The RCA was large and dominant with mild nonobstructive plaquing and patent stent in the PDA unchanged from previous study.    He was recommended for dual antiplatelet therapy with aspirin and clopidogrel for 12 months and significant risk reduction measures.  He has history of atrial flutter and has had atrial flutter ablation, he also had chest x-ray revealing 10 mm groundglass opacity in the right lower lobe with follow-up CT scan in 6 to 12 months to confirm persistence.  He is here today with ongoing complaints of chest pain.  The patient states is better than what it was prior to stent placement but he continues to have left-sided discomfort.  He states he takes nitroglycerin several times a day.  He has been tried on isosorbide mononitrate in the past but this caused severe headaches and migraines and he refuses this.  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  The patient has not felt better overall.  He states when he takes a nitroglycerin he does have relief of pain.  He has been compliant with clopidogrel,  atorvastatin, metoprolol, and aspirin.  He rarely uses his albuterol.  He states that when he met back to work he had to do a lot of walking initially this caused him to have discomfort in his chest and he had to leave early due to this pain.  He did not seek medical attention.  ROS: As above otherwise negative.  Studies Reviewed: Marland Kitchen   EKG Interpretation Date/Time:  Tuesday December 08 2023 08:19:17 EST Ventricular Rate:  85 PR Interval:  136 QRS Duration:  86 QT Interval:  372 QTC Calculation: 442 R Axis:   57  Text Interpretation: Normal sinus rhythm Normal ECG When compared with ECG of 21-Nov-2023 05:57, No significant change was found Confirmed by Joni Reining [phone]) on 12/08/2023 8:27:22 AM    [facility] 11/21/2023 CARDIAC CATH: 11/20/2023 1.  Patent left main with no significant stenosis 2.  Patent LAD with a widely patent proximal LAD stent site and no significant restenosis.  The mid LAD bifurcation disease is unchanged with nonobstructive plaquing involving a 35% stenosis at the ostium of the diagonal and a 50% stenosis in the LAD 3.  Progressive and severe stenosis of the second OM branch of the left circumflex, reduced from 99 to 0% with a 2.5 x 38 mm Synergy DES 4.  Large, dominant RCA with mild nonobstructive plaquing and patent stent in the PDA unchanged from the previous study 5.  Normal LVEDP    Physical Exam:   VS:  BP 132/88 (BP Location: Left  Arm, Patient Position: Sitting, Cuff Size: Normal)   Pulse 85   Ht 5\' 8"  (1.727 m)   Wt 246 lb 3.2 oz (111.7 kg)   SpO2 98%   BMI 37.43 kg/m    Wt Readings from Last 3 Encounters:  12/08/23 246 lb 3.2 oz (111.7 kg)  11/20/23 241 lb 13.5 oz (109.7 kg)  11/20/23 244 lb (110.7 kg)    GEN: Well nourished, well developed in no acute distress NECK: No JVD; No carotid bruits CARDIAC: RRR, no murmurs, rubs, gallops RESPIRATORY:  Clear to auscultation without rales, wheezing or rhonchi  ABDOMEN: Soft, non-tender,  non-distended EXTREMITIES:  No edema; No deformity right wrist catheter insertion site well-healed without evidence of hematoma.  ASSESSMENT AND PLAN: .    Coronary artery disease: He is status post cardiac catheterization on 11/20/2023 completed for unstable angina.  The patient had progressive severe stenosis of the second OM branch of the left circumflex.  He is status post DES 2.5 x 38 mm with 0% residual.  Prior stent in the LAD was widely patent.  There is nonobstructive disease involving the ostium of the diagonal with 50% stenosis of the LAD.        He continues to have recurrent chest pain with and without exertion.  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  He continues to have to take nitroglycerin several times during the day.  This does provide relief.  He refuses isosorbide mononitrate as this caused severe headaches.  I will start him on Ranexa 500 mg twice daily.  He can always titrate up to 1000 mg twice daily.  I have offered cardiac rehab to help with stamina.  He refuses as he states he does not had time.    2.  Hypercholesterolemia: Remains on atorvastatin 80 mg daily.  Goal of LDL less than 70.  Most recent labs documented in September 2024 total cholesterol 97, HDL 32, LDL 51.  LP(a) was drawn on 11/21/2023, this was 200.1.  3.  Type 2 diabetes: The patient states his blood sugar has gotten some better since having had catheterization.  He said initially it was elevated over 300 but is now in the 120 range.  He is to follow-up with endocrinology.  4.  Paroxysmal atrial flutter: Patient is status post atrial flutter ablation.  He is not on anticoagulation at this time.  He remains on aspirin 81 mg with his DAPT therapy.  5.  Abnormality right lower lobe: Incidental finding of a 10 mm groundglass opacity in the right lower lobe per chest x-ray.  Recommend repeat CT scan in 6 to 12 months to confirm persistence.    Refer to  [employer] [facility] to be established for primary care.   Signed, Bettey Mare. Liborio Nixon, ANP, AACC

## 2023-12-08 ENCOUNTER — Ambulatory Visit: Payer: 59 | Attending: Adult Health | Admitting: Adult Health

## 2023-12-08 ENCOUNTER — Encounter: Payer: Self-pay | Admitting: Adult Health

## 2023-12-08 VITALS — BP 132/88 | HR 85 | Ht 68.0 in | Wt 246.2 lb

## 2023-12-08 DIAGNOSIS — Z794 Long term (current) use of insulin: Secondary | ICD-10-CM

## 2023-12-08 DIAGNOSIS — I25110 Atherosclerotic heart disease of native coronary artery with unstable angina pectoris: Secondary | ICD-10-CM

## 2023-12-08 DIAGNOSIS — E7800 Pure hypercholesterolemia, unspecified: Secondary | ICD-10-CM | POA: Diagnosis not present

## 2023-12-08 DIAGNOSIS — E1159 Type 2 diabetes mellitus with other circulatory complications: Secondary | ICD-10-CM

## 2023-12-08 DIAGNOSIS — I2510 Atherosclerotic heart disease of native coronary artery without angina pectoris: Secondary | ICD-10-CM

## 2023-12-08 DIAGNOSIS — R911 Solitary pulmonary nodule: Secondary | ICD-10-CM | POA: Diagnosis not present

## 2023-12-08 DIAGNOSIS — I209 Angina pectoris, unspecified: Secondary | ICD-10-CM

## 2023-12-08 DIAGNOSIS — Z9861 Coronary angioplasty status: Secondary | ICD-10-CM

## 2023-12-08 MED ORDER — NITROGLYCERIN 0.4 MG SL SUBL: 0.4000 mg | SUBLINGUAL_TABLET | SUBLINGUAL | 5 refills | Status: AC | PRN

## 2023-12-08 MED ORDER — CLOPIDOGREL BISULFATE 75 MG PO TABS: 75.0000 mg | ORAL_TABLET | Freq: Every day | ORAL | 5 refills | Status: DC

## 2023-12-08 MED ORDER — RANOLAZINE ER 500 MG PO TB12: 500.0000 mg | ORAL_TABLET | Freq: Two times a day (BID) | ORAL | 5 refills | Status: DC

## 2023-12-08 NOTE — Patient Instructions (Addendum)
Medication Instructions:  START RANEXA 500 MG TWICE DAILY.    Lab Work: NONE    Testing/Procedures: NONE   Follow-Up: At [facility], you and your health needs are our priority.  As part of our continuing mission to provide you with exceptional heart care, we have created designated Provider Care Teams.  These Care Teams include your primary Cardiologist (physician) and Advanced Practice Providers (APPs -  Physician Assistants and Nurse Practitioners) who all work together to provide you with the care you need, when you need it.  We recommend signing up for the patient portal called "MyChart".  Sign up information is provided on this After Visit Summary.  MyChart is used to connect with patients for Virtual Visits (Telemedicine).  Patients are able to view lab/test results, encounter notes, upcoming appointments, etc.  Non-urgent messages can be sent to your provider as well.   To learn more about what you can do with MyChart, go to [email or web address].    Your next appointment:   3 month(s)  Provider:   Thurmon Fair, MD

## 2024-02-16 ENCOUNTER — Telehealth: Payer: Self-pay | Admitting: Internal Medicine

## 2024-02-16 NOTE — Telephone Encounter (Addendum)
 Please advise on other rx.  While [place] took the Ozempic out to the patient she stated that the patient advised to her that he can not take the 0.5 mg of Ozempic due to heart burn. OK to just stay on the 0.25 mg

## 2024-02-16 NOTE — Telephone Encounter (Signed)
 Sample  Medication: Dose: 0.25/0.5 Quantity:1 [id number]  Dicie Beam

## 2024-02-16 NOTE — Telephone Encounter (Signed)
 Patient is here, in the lobby, to see if he can get another sample of Ozempic. He has run out and his next appt is not until March 03, 2024.  He said we have been supplying him with samples because he can not afford Ozempic.  Patient also requesting an RX for Tagamet 200 MG due to side effects of Ozempic.

## 2024-02-17 MED ORDER — CIMETIDINE 200 MG PO TABS: 200.0000 mg | ORAL_TABLET | Freq: Every day | ORAL | 3 refills | Status: DC

## 2024-02-17 NOTE — Telephone Encounter (Signed)
 LMTRC

## 2024-02-17 NOTE — Addendum Note (Signed)
 Addended by: Pollie Meyer on: 02/17/2024 11:34 AM   Modules accepted: Orders

## 2024-02-26 ENCOUNTER — Ambulatory Visit: Payer: Self-pay | Attending: Cardiovascular Disease | Admitting: Cardiovascular Disease

## 2024-02-26 ENCOUNTER — Encounter: Payer: Self-pay | Admitting: Cardiovascular Disease

## 2024-02-26 VITALS — BP 138/86 | HR 80 | Ht 68.0 in | Wt 237.8 lb

## 2024-02-26 DIAGNOSIS — E119 Type 2 diabetes mellitus without complications: Secondary | ICD-10-CM

## 2024-02-26 DIAGNOSIS — I700 Atherosclerosis of aorta: Secondary | ICD-10-CM

## 2024-02-26 DIAGNOSIS — I25118 Atherosclerotic heart disease of native coronary artery with other forms of angina pectoris: Secondary | ICD-10-CM | POA: Diagnosis not present

## 2024-02-26 DIAGNOSIS — R911 Solitary pulmonary nodule: Secondary | ICD-10-CM

## 2024-02-26 DIAGNOSIS — E7800 Pure hypercholesterolemia, unspecified: Secondary | ICD-10-CM | POA: Diagnosis not present

## 2024-02-26 DIAGNOSIS — J449 Chronic obstructive pulmonary disease, unspecified: Secondary | ICD-10-CM | POA: Diagnosis not present

## 2024-02-26 DIAGNOSIS — Z8679 Personal history of other diseases of the circulatory system: Secondary | ICD-10-CM

## 2024-02-26 MED ORDER — RANOLAZINE ER 500 MG PO TB12: 1000.0000 mg | ORAL_TABLET | Freq: Two times a day (BID) | ORAL | 3 refills | Status: DC

## 2024-02-26 MED ORDER — PANTOPRAZOLE SODIUM 40 MG PO TBEC: 40.0000 mg | DELAYED_RELEASE_TABLET | Freq: Every day | ORAL | 11 refills | Status: AC

## 2024-02-26 NOTE — Patient Instructions (Signed)
 Medication Instructions:  - START pantoprazole (PROTONIX) 40MG  by mouth daily  - Start ranolazine (Ranexa) 1000 mg by mouth twice daily   *If you need a refill on your cardiac medications before your next appointment, please call your pharmacy*   Lab Work: None    If you have labs (blood work) drawn today and your tests are completely normal, you will receive your results only by: MyChart Message (if you have MyChart) OR A paper copy in the mail If you have any lab test that is abnormal or we need to change your treatment, we will call you to review the results.   Testing/Procedures: CHEST CT    Follow-Up: At [facility], you and your health needs are our priority.  As part of our continuing mission to provide you with exceptional heart care, we have created designated Provider Care Teams.  These Care Teams include your primary Cardiologist (physician) and Advanced Practice Providers (APPs -  Physician Assistants and Nurse Practitioners) who all work together to provide you with the care you need, when you need it.  We recommend signing up for the patient portal called "MyChart".  Sign up information is provided on this After Visit Summary.  MyChart is used to connect with patients for Virtual Visits (Telemedicine).  Patients are able to view lab/test results, encounter notes, upcoming appointments, etc.  Non-urgent messages can be sent to your provider as well.   To learn more about what you can do with MyChart, go to [email or web address].    Your next appointment:   2 month(s): In May after Chest CT scan   The format for your next appointment:   In Person  Provider:   Edd Fabian, FNP, Micah Flesher, PA-C, Marjie Skiff, PA-C, Robet Leu, PA-C, Juanda Crumble, PA-C, Joni Reining, DNP, ANP, Azalee Course, PA-C, Bernadene Person, NP, or Reather Littler, NP    Then, Thurmon Fair, MD will plan to see you again in 9 month(s).   Other Instructions

## 2024-02-26 NOTE — Progress Notes (Signed)
 Cardiology Office Note:    Date:  02/26/2024   ID:  Jerry Shaffer, DOB 02-03-1961, MRN [id number]  PCP:  Patient, No Pcp Per  [facility] Cardiologist:  Thurmon Fair, MD  [facility] Electrophysiologist:  Maurice Small, MD   Referring MD: No ref. provider found   Chief Complaint  Patient presents with   Coronary Artery Disease    History of Present Illness:    BYRL LATIN is a 63 y.o. male with a hx of rcoronary artery disease presenting with inferior NSTEMI on September 20, 2020 due to occlusion of the mid PDA emergency PCI-drug-eluting stent (18 x 2.75 mm Onyx), subsequently presented with unstable angina and underwent PCI-drug-eluting stent (3.5 x 20 mm Synergy to the proximal LAD on March 28, 2021), subsequently had worsening exertional angina and PET scan showed inferolateral ischemia so he received a drug-eluting stent to a severe stenosis in the OM2(11/20/2023 Synergy XD 2.50 x 38 mm).  Also known to have aortic atherosclerosis,  migraine headaches, dyslipidemia (low HDL), type 2 diabetes mellitus, adult ADHD, chronic smoker.  He underwent successful ablation for typical atrial flutter by Dr. Nelly Laurence in September 2023 and has not had any sustained arrhythmia recurrence since then.   He had significant improvement in his exertional chest pain after placement of the OM2 stent, but continued to have some exertional angina.  Started Ranexa 500 mg twice daily with improvement.  He reports Ranexa is not as good as the isosorbide mononitrate at preventing chest pain, but at least his "head does not blow off".  He has not increased to the full 1000 mg dose of Ranexa yet.  He has taken nitroglycerin "a couple of times" since last December, but avoids taking it because of the side effects.  His symptoms always resolve with rest.  His biggest complaint today is severe heartburn ever since he started treatment with Ozempic about 3 months ago.  He did have significant improvement in  his symptoms when he takes Tagamet, but we discussed today that this medication has interactions with his clopidogrel and atorvastatin.  He reports that other H2 blocker such as famotidine and ranitidine have not offered the same benefit as the cimetidine.  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  He would like to return to his previous program of 32 hours a week.  On the PET/CT from last November which led to the cardiac catheterization December he was noted to have a 10 mm opacity in the right lower lobe and he will need a repeat CT for follow-up in 6 months (in May 2025).  He reports a longstanding history of cough, sometimes with hemoptysis that began about 5 years ago when he is convinced that he had what he calls "COVID A".  He had a severe respiratory infection in November 2019, before COVID-19 was officially recognized as a problem.  He does have occasional episodes of severe wheezing.  He used albuterol yesterday.  He has had less wheezing on metoprolol compared to carvedilol.  He denies palpitations or tachycardia, dizziness or syncope.    Metabolic control is fair.  His total cholesterol is 97 and LDL is low at 51, but he has a chronically low HDL at 32.  However this has improved a little bit as he is losing weight.  Triglycerides are normal.  His most recent hemoglobin  A1c is 8.5%, before he started treatment with Ozempic.  He is also on glipizide and metformin.    Past Medical History:  Diagnosis Date   Atrial flutter (HCC)    Bipolar 1 disorder (HCC)    Coronary artery disease    Diabetes mellitus    Dyslipidemia    Emphysema    GERD (gastroesophageal reflux disease)    History of kidney stones    Hypertension    Mental disorder    BIPOLAR DISORDER   Migraine    NSTEMI (non-ST elevated myocardial infarction) (HCC) 09/20/2020   Pneumonia    PONV  (postoperative nausea and vomiting)    Renal disorder    Shortness of breath    Stroke (HCC)    Transient ischemic attack (TIA)     Past Surgical History:  Procedure Laterality Date   A-FLUTTER ABLATION N/A 09/08/2022   Procedure: A-FLUTTER ABLATION;  Surgeon: Mealor, Roberts Gaudy, MD;  Location: [facility] CV [facility];  Service: Cardiovascular;  Laterality: N/A;   COLONOSCOPY WITH PROPOFOL N/A 10/17/2021   Procedure: COLONOSCOPY WITH PROPOFOL;  Surgeon: Rachael Fee, MD;  Location: [facility];  Service: Endoscopy;  Laterality: N/A;   CORONARY ANGIOGRAPHY  09/20/2020   CORONARY STENT INTERVENTION  09/20/2020   CORONARY STENT INTERVENTION   CORONARY STENT INTERVENTION N/A 09/20/2020   Procedure: CORONARY STENT INTERVENTION;  Surgeon: Lyn Records, MD;  Location: [facility];  Service: Cardiovascular;  Laterality: N/A;   CORONARY STENT INTERVENTION N/A 03/28/2021   Procedure: CORONARY STENT INTERVENTION;  Surgeon: Tonny Bollman, MD;  [facility] CV LAB;  Service: Cardiovascular;  Laterality: N/A;   CORONARY STENT INTERVENTION N/A 11/20/2023   Procedure: CORONARY STENT INTERVENTION;  Surgeon: Tonny Bollman, MD;  Location: [facility] INVASIVE CV LAB;  Service: Cardiovascular;  Laterality: N/A;   HEMORROIDECTOMY     KNEE ARTHROSCOPY     LEFT HEART CATH AND CORONARY ANGIOGRAPHY N/A 09/20/2020   Procedure: LEFT HEART CATH AND CORONARY ANGIOGRAPHY;  Surgeon: Lyn Records, MD;  Location: [facility];  Service: Cardiovascular;  Laterality: N/A;   LEFT HEART CATH AND CORONARY ANGIOGRAPHY N/A 03/28/2021   Procedure: LEFT HEART CATH AND CORONARY ANGIOGRAPHY;  Surgeon: Tonny Bollman, MD;  Location: [facility] INVASIVE CV LAB;  Service: Cardiovascular;  Laterality: N/A;   LEFT HEART CATH AND CORONARY ANGIOGRAPHY N/A 11/20/2023   Procedure: LEFT HEART CATH AND CORONARY ANGIOGRAPHY;  Surgeon: Tonny Bollman, MD;  Location: [facility] INVASIVE CV LAB;  Service: Cardiovascular;  Laterality: N/A;    LUMBAR DISC SURGERY     POLYPECTOMY  10/17/2021   Procedure: POLYPECTOMY;  Surgeon: Rachael Fee, MD;  Location: [facility];  Service: Endoscopy;;   SHOULDER ARTHROSCOPY WITH DISTAL CLAVICLE RESECTION Left 01/20/2023   Procedure: SHOULDER ARTHROSCOPY WITH DISTAL CLAVICLE EXCISION;  Surgeon: Teryl Lucy, MD;  Location: [facility];  Service: Orthopedics;  Laterality: Left;   SHOULDER ARTHROSCOPY WITH ROTATOR CUFF REPAIR AND SUBACROMIAL DECOMPRESSION Left 01/20/2023   Procedure: SHOULDER ARTHROSCOPY WITH ROTATOR CUFF REPAIR AND SUBACROMIAL DECOMPRESSION;  Surgeon: Teryl Lucy, MD;  Location: [facility];  Service: Orthopedics;  Laterality: Left;   TONSILLECTOMY AND ADENOIDECTOMY      Current Medications: Current Meds  Medication Sig   albuterol (VENTOLIN HFA) 108 (90 Base) MCG/ACT inhaler Inhale 1-2 puffs into the lungs every 6 (six) hours as needed for wheezing or shortness of breath.   atorvastatin (LIPITOR) 80 MG tablet TAKE 1 TABLET BY MOUTH EVERY DAY   cimetidine (TAGAMET HB) 200  MG tablet Take 1 tablet (200 mg total) by mouth daily.   clopidogrel (PLAVIX) 75 MG tablet Take 1 tablet (75 mg total) by mouth daily with breakfast.   glipiZIDE (GLUCOTROL) 5 MG tablet Take 1 tablet (5 mg total) by mouth 2 (two) times daily before a meal.   metFORMIN (GLUCOPHAGE-XR) 500 MG 24 hr tablet Take 4 tablets (2,000 mg total) by mouth daily.   methocarbamol (ROBAXIN) 500 MG tablet Take 1 tablet (500 mg total) by mouth every 8 (eight) hours as needed for muscle spasms.   metoprolol tartrate (LOPRESSOR) 100 MG tablet Take 1 tablet (100 mg total) by mouth 2 (two) times daily.   nitroGLYCERIN (NITROSTAT) 0.4 MG SL tablet Place 1 tablet (0.4 mg total) under the tongue every 5 (five) minutes as needed for chest pain.   OVER THE COUNTER MEDICATION daily. [employer]y's Extra Strength Headache Powder   pantoprazole (PROTONIX) 40 MG tablet Take 1 tablet (40 mg total) by mouth daily.   ranolazine (RANEXA) 500 MG 12 hr tablet  Take 2 tablets (1,000 mg total) by mouth 2 (two) times daily.   [DISCONTINUED] ranolazine (RANEXA) 500 MG 12 hr tablet Take 1 tablet (500 mg total) by mouth 2 (two) times daily.     Allergies:   Bee venom, Penicillins, Procaine, Isosorbide, and Tape   Social History   Socioeconomic History   Marital status: Single    Spouse name: Not on file   Number of children: Not on file   Years of education: Not on file   Highest education level: Not on file  Occupational History   Occupation: Security guard    Employer: [employer]  Tobacco Use   Smoking status: Former    Types: E-cigarettes   Smokeless tobacco: Former    Quit date: 2022  Vaping Use   Vaping status: Former  Substance and Sexual Activity   Alcohol use: No   Drug use: Not Currently   Sexual activity: Not Currently  Other Topics Concern   Not on file  Social History Narrative   Not on file   Social Drivers of Health   Financial Resource Strain: Not on file  Food Insecurity: No Food Insecurity (11/21/2023)   Hunger Vital Sign    Worried About Running Out of Food in the Last Year: Never true    Ran Out of Food in the Last Year: Never true  Transportation Needs: No Transportation Needs (11/21/2023)   PRAPARE - Administrator, Civil Service (Medical): No    Lack of Transportation (Non-Medical): No  Physical Activity: Not on file  Stress: Not on file  Social Connections: Not on file     Family History: The patient's family history includes Bipolar disorder in an other family member; CAD (age of onset: 67) in his mother; Coronary artery disease in an other family member; Diabetes type II in an other family member; Lung cancer in his father.  ROS:   Please see the history of present illness.     All other systems reviewed and are negative.  EKGs/Labs/Other Studies Reviewed:    The following studies were reviewed today:  PET-CT Oct 26, 2023  Findings are consistent with ischemia in the  distribution of a distal oblique marginal branch of the left circumflex coronary artery or a posterolateral ventricular artery branch of the right coronary artery. The study is intermediate risk.   LV perfusion is abnormal. There is evidence of ischemia. There is no evidence of infarction. Defect 1:  There is a medium defect with moderate reduction in uptake present in the mid to basal inferolateral location(s) that is reversible. There is normal wall motion in the defect area. Consistent with ischemia. The defect is consistent with abnormal perfusion in the LCx or RCA territories.   Rest left ventricular function is normal. Rest EF: 51%. Stress left ventricular function is normal. Stress EF: 58%. End diastolic cavity size is normal. End systolic cavity size is normal.   Myocardial blood flow was computed to be 0.4ml/g/min at rest and 1.7ml/g/min at stress. Global myocardial blood flow reserve was 2.26 and was normal. MBF reserve was reduced in the left circumflex artery territory (1.71), particularly reduced in the area of the reversible perfusion defect (1.24)   Coronary calcium assessment not performed due to prior revascularization. OVER_READ IMPRESSION: 10 mm ground-glass opacity right lower lobe. Initial follow-up with CT at 6-12 months is recommended to confirm persistence. If persistent, repeat CT is recommended every 2 years until 5 years of stability has been established.   ECHO 09/06/2022   1. Left ventricular ejection fraction, by estimation, is 60 to 65%. The  left ventricle has normal function. The left ventricle has no regional  wall motion abnormalities. There is mild left ventricular hypertrophy.  Left ventricular diastolic parameters  are indeterminate.   2. Right ventricular systolic function is normal. The right ventricular  size is normal.   3. Left atrial size was mildly dilated.   4. The mitral valve is normal in structure. No evidence of mitral valve  regurgitation. No  evidence of mitral stenosis.   5. The aortic valve was not well visualized. Aortic valve regurgitation  is not visualized. No aortic stenosis is present.   6. The inferior vena cava is normal in size with greater than 50%  respiratory variability, suggesting right atrial pressure of 3 mmHg.    Cardiac Catheterization 11/04/2023  1.  Patent left main with no significant stenosis 2.  Patent LAD with a widely patent proximal LAD stent site and no significant restenosis.  The mid LAD bifurcation disease is unchanged with nonobstructive plaquing involving a 35% stenosis at the ostium of the diagonal and a 50% stenosis in the LAD 3.  Progressive and severe stenosis of the second OM branch of the left circumflex, reduced from 99 to 0% with a 2.5 x 38 mm Synergy DES 4.  Large, dominant RCA with mild nonobstructive plaquing and patent stent in the PDA unchanged from the previous study 5.  Normal LVEDP   Recommendations: DAPT with aspirin and clopidogrel x 12 months, aggressive risk reduction measures.    Diagnostic Dominance: Right  Intervention   Synergy Xd 2.50x38  EKG: EKG is ordered today and shows normal sinus rhythm, normal tracing. Recent Labs: 11/20/2023: ALT 20 11/21/2023: BUN 10; Creatinine, Ser 0.77; Hemoglobin 13.6; Platelets 216; Potassium 3.8; Sodium 135  Recent Lipid Panel    Component Value Date/Time   CHOL 97 (L) 08/21/2023 0940   TRIG 65 08/21/2023 0940   HDL 32 (L) 08/21/2023 0940   CHOLHDL 3.0 08/21/2023 0940   CHOLHDL 3.7 09/06/2022 0226   VLDL 34 09/06/2022 0226   LDLCALC 51 08/21/2023 0940     Risk Assessment/Calculations:       Physical Exam:    VS:  BP 138/86 (BP Location: Left Arm, Patient Position: Sitting, Cuff Size: Normal)   Pulse 80   Ht 5\' 8"  (1.727 m)   Wt 237 lb 12.8 oz (107.9 kg)   SpO2 96%  BMI 36.16 kg/m     Wt Readings from Last 3 Encounters:  02/26/24 237 lb 12.8 oz (107.9 kg)  12/08/23 246 lb 3.2 oz (111.7 kg)  11/20/23 241 lb  13.5 oz (109.7 kg)    General: Alert, oriented x3, no distress, severely obese Head: no evidence of trauma, PERRL, EOMI, no exophtalmos or lid lag, no myxedema, no xanthelasma; normal ears, nose and oropharynx Neck: normal jugular venous pulsations and no hepatojugular reflux; brisk carotid pulses without delay and no carotid bruits Chest: clear to auscultation, no signs of consolidation by percussion or palpation, normal fremitus, symmetrical and full respiratory excursions Cardiovascular: normal position and quality of the apical impulse, regular rhythm, normal first and second heart sounds, no murmurs, rubs or gallops Abdomen: no tenderness or distention, no masses by palpation, no abnormal pulsatility or arterial bruits, normal bowel sounds, no hepatosplenomegaly Extremities: no clubbing, cyanosis or edema; 2+ radial, ulnar and brachial pulses bilaterally; 2+ right femoral, posterior tibial and dorsalis pedis pulses; 2+ left femoral, posterior tibial and dorsalis pedis pulses; no subclavian or femoral bruits Neurological: grossly nonfocal Psych: Normal mood and affect      ASSESSMENT:    1. Coronary artery disease of native artery of native heart with stable angina pectoris (HCC)   2. Hypercholesterolemia   3. Lung nodule seen on imaging study   4. Chronic obstructive pulmonary disease, unspecified COPD type (HCC)   5. Controlled type 2 diabetes mellitus without complication, without long-term current use of insulin (HCC)   6. Aortic atherosclerosis (HCC)   7. Severe obesity (BMI 35.0-39.9) with comorbidity (HCC)   8. History of atrial flutter        PLAN:    In order of problems listed above:  CAD: Continues to have some exertional angina.  Will increase the ranolazine to the maximum dose.  Continue metoprolol.  He did not tolerate long-acting nitrates.  On max dose atorvastatin, aspirin, clopidogrel.  Avoid cimetidine due to drug interactions.  Give him a prescription for  pantoprazole 40 mg daily. HLP: Excellent total cholesterol and LDL cholesterol, some improvement in his chronically low HDL.  Continue atorvastatin 80 mg daily and efforts at weight loss. History of smoking/probable COPD: Doing better after we switched from carvedilol to metoprolol but still has occasional wheezing and needs albuterol.  Has had some hemoptysis but previous evaluation was felt to be low risk.  His recent CT from the PET-CT in December showed a suspicious area in the right lower lobe.  Will get another CT scan in May. DM: Control has actually deteriorated compared to a year ago and he blames that on his inability to exercise, which is now less of a problem.  I started Ozempic and has lost about 10 pounds already.  Suspect that his hemoglobin A1c will show improvement.  He reports that his fasting glucose today was only 127. Aortic atherosclerosis: Normal caliber, noted on multiple imaging tests. Obesity: Ozempic seems to be helping with weight loss but he remains in severely obese range. Atrial flutter status post ablation: No recurrence in more than a year since his ablation procedure.  Off Eliquis.   Shared Decision Making/Informed Consent      This procedure has been fully reviewed with the patient and written informed consent has been obtained.    Medication Adjustments/Labs and Tests Ordered: Current medicines are reviewed at length with the patient today.  Concerns regarding medicines are outlined above.  Orders Placed This Encounter  Procedures   CT Chest  Wo Contrast    Meds ordered this encounter  Medications   pantoprazole (PROTONIX) 40 MG tablet    Sig: Take 1 tablet (40 mg total) by mouth daily.    Dispense:  30 tablet    Refill:  11   ranolazine (RANEXA) 500 MG 12 hr tablet    Sig: Take 2 tablets (1,000 mg total) by mouth 2 (two) times daily.    Dispense:  90 tablet    Refill:  3     Patient Instructions  Medication Instructions:  - START pantoprazole  (PROTONIX) 40MG  by mouth daily  - Start ranolazine (Ranexa) 1000 mg by mouth twice daily   *If you need a refill on your cardiac medications before your next appointment, please call your pharmacy*   Lab Work: None    If you have labs (blood work) drawn today and your tests are completely normal, you will receive your results only by: MyChart Message (if you have MyChart) OR A paper copy in the mail If you have any lab test that is abnormal or we need to change your treatment, we will call you to review the results.   Testing/Procedures: CHEST CT    Follow-Up: At [facility], you and your health needs are our priority.  As part of our continuing mission to provide you with exceptional heart care, we have created designated Provider Care Teams.  These Care Teams include your primary Cardiologist (physician) and Advanced Practice Providers (APPs -  Physician Assistants and Nurse Practitioners) who all work together to provide you with the care you need, when you need it.  We recommend signing up for the patient portal called "MyChart".  Sign up information is provided on this After Visit Summary.  MyChart is used to connect with patients for Virtual Visits (Telemedicine).  Patients are able to view lab/test results, encounter notes, upcoming appointments, etc.  Non-urgent messages can be sent to your provider as well.   To learn more about what you can do with MyChart, go to [email or web address].    Your next appointment:   2 month(s): In May after Chest CT scan   The format for your next appointment:   In Person  Provider:   Edd Fabian, FNP, Micah Flesher, PA-C, Marjie Skiff, PA-C, Robet Leu, PA-C, Juanda Crumble, PA-C, Joni Reining, DNP, ANP, Azalee Course, PA-C, Bernadene Person, NP, or Reather Littler, NP    Then, Thurmon Fair, MD will plan to see you again in 9 month(s).   Other Instructions    Signed, Thurmon Fair, MD  02/26/2024 10:15 AM    [facility]

## 2024-03-03 ENCOUNTER — Encounter: Payer: Self-pay | Admitting: Internal Medicine

## 2024-03-03 ENCOUNTER — Ambulatory Visit (INDEPENDENT_AMBULATORY_CARE_PROVIDER_SITE_OTHER): Payer: 59 | Admitting: Internal Medicine

## 2024-03-03 VITALS — BP 120/70 | HR 81 | Ht 68.0 in | Wt 238.8 lb

## 2024-03-03 DIAGNOSIS — E1165 Type 2 diabetes mellitus with hyperglycemia: Secondary | ICD-10-CM | POA: Diagnosis not present

## 2024-03-03 DIAGNOSIS — E1159 Type 2 diabetes mellitus with other circulatory complications: Secondary | ICD-10-CM

## 2024-03-03 DIAGNOSIS — E785 Hyperlipidemia, unspecified: Secondary | ICD-10-CM

## 2024-03-03 DIAGNOSIS — Z7984 Long term (current) use of oral hypoglycemic drugs: Secondary | ICD-10-CM | POA: Diagnosis not present

## 2024-03-03 DIAGNOSIS — Z7985 Long-term (current) use of injectable non-insulin antidiabetic drugs: Secondary | ICD-10-CM | POA: Diagnosis not present

## 2024-03-03 LAB — POCT GLYCOSYLATED HEMOGLOBIN (HGB A1C): Hemoglobin A1C: 7 % — AB (ref 4.0–5.6)

## 2024-03-03 NOTE — Addendum Note (Signed)
 Addended by: Pollie Meyer on: 03/03/2024 09:24 AM   Modules accepted: Orders

## 2024-03-03 NOTE — Patient Instructions (Addendum)
 Please continue: - Metformin ER 1000 mg 2x daily - Glipizide 5 mg 2x a day 30 min before meals - Ozempic 0.25 mg weekly/Trulicity 1.5 mg weekly  If you do not have Ozempic/Trulicity, try to increase: - Glipizide 10 mg 2x a day 30 min before meals  Look up your copay for: - Mounjaro (weekly) - Bydureon (weekly) - Victoza (once day) - Byetta (2x a day)  Please return in 4 months.

## 2024-03-03 NOTE — Progress Notes (Signed)
 Patient ID: Jerry Shaffer, male   DOB: [birth year], 63 y.o.   MRN: [id number]  HPI: Jerry Shaffer is a 63 y.o.-year-old male, returning for follow-up for DM2, dx in 2013, non insulin-dependent (on insulin briefly in 2016, and then again in 2021, but off now), uncontrolled, with complications (CAD - s/p NSTEMI, TIA, PN). Pt. previously saw Dr. Everardo All, but last visit with me 4 months ago. Insurance: [employer]  Interim history: No increased urination, but no blurry vision, nausea.  However, he has significant acid reflux.  He is taking Tums and was also just started on Protonix. He is on a very limited income.  He also has a lot of stress at work. Since last visit he had another NSTEMI 11/20/2023. He is preparing to participate in a trout fishing tournament in [place].  Reviewed HbA1c: Lab Results  Component Value Date   HGBA1C 8.5 (A) 10/27/2023   HGBA1C 7.6 (H) 08/21/2023   HGBA1C 6.9 06/23/2023   HGBA1C 7.5 (H) 01/08/2023   HGBA1C 8.3 (A) 11/04/2022   HGBA1C 8.6 (H) 09/06/2022   HGBA1C 9.0 (A) 08/26/2022   HGBA1C 10.0 (A) 04/18/2022   HGBA1C 7.8 (A) 01/17/2022   HGBA1C 7.3 (A) 10/11/2021   Pt is on a regimen of: - Metformin ER 1000 mg 2x daily >> occas. Loose stools - Glipizide 5 mg before bedtime >> before breakfast >> after b'fast >> 5 mg 2x a day before meals - Ozempic 0.25 >> 0.5 >> off >> 0.25 mg weekly (samples) He has needle phobia with syringes.  He has no problems injecting insulin with pens. He did not want to try Farxiga due to fear of side effects. He was on Trulicity >> could not obtain it.  He was on [place] >> not affordable.  Pt checks his sugars 2-3x a day and they are: - am: going to bed: 89-139 >> (bedtime) <160 >> 150-175 >> 130-200 - 2h after b'fast: n/c - before lunch: n/c - 2h after lunch: n/c - before dinner: waking up: n/c >> 97, 112-115 >> 180-190 >> 118-130 - after dinner: 128-150 >> <160, 185 >> 200s >> n/c - bedtime: n/c - nighttime: n/c Lowest  sugar was 22 (many years ago - on 10 mg Glipizide then) >>...  97 >> 150 >> 79; he has hypoglycemia awareness at 70.  Highest sugar was 999 (long time ago)  >> .Marland KitchenMarland Kitchen240 (ate late) >> 185 >> upper 200s (ramen noodles).  Glucometer:?  - no CKD, last BUN/creatinine:  Lab Results  Component Value Date   BUN 10 11/21/2023   BUN 17 11/20/2023   CREATININE 0.77 11/21/2023   CREATININE 0.85 11/20/2023   Lab Results  Component Value Date   MICRALBCREAT 0.8 10/27/2023  He is not on ACE inhibitor/ARB.  - He has HL; last set of lipids: Lab Results  Component Value Date   CHOL 97 (L) 08/21/2023   HDL 32 (L) 08/21/2023   LDLCALC 51 08/21/2023   TRIG 65 08/21/2023   CHOLHDL 3.0 08/21/2023  On Lipitor 80 mg daily.  - last eye exam was "several years ago". No DR reportedly.  He cannot afford an a new exam.  - + numbness and tingling in his feet.  Last foot exam 10/27/2023.  He also has a history of bipolar disease, emphysema, migraines. He was in the ED with A-fib with RVR 09/06/2022. He had to have an ablation.  ROS: + see HPI  Past Medical History:  Diagnosis Date   Atrial flutter (HCC)  Bipolar 1 disorder (HCC)    Coronary artery disease    Diabetes mellitus    Dyslipidemia    Emphysema    GERD (gastroesophageal reflux disease)    History of kidney stones    Hypertension    Mental disorder    BIPOLAR DISORDER   Migraine    NSTEMI (non-ST elevated myocardial infarction) (HCC) 09/20/2020   Pneumonia    PONV (postoperative nausea and vomiting)    Renal disorder    Shortness of breath    Stroke (HCC)    Transient ischemic attack (TIA)    Past Surgical History:  Procedure Laterality Date   A-FLUTTER ABLATION N/A 09/08/2022   Procedure: A-FLUTTER ABLATION;  Surgeon: Mealor, Roberts Gaudy, MD;  Location: [facility];  Service: Cardiovascular;  Laterality: N/A;   COLONOSCOPY WITH PROPOFOL N/A 10/17/2021   Procedure: COLONOSCOPY WITH PROPOFOL;  Surgeon: Rachael Fee,  MD;  Location: [facility];  Service: Endoscopy;  Laterality: N/A;   CORONARY ANGIOGRAPHY  09/20/2020   CORONARY STENT INTERVENTION  09/20/2020   CORONARY STENT INTERVENTION   CORONARY STENT INTERVENTION N/A 09/20/2020   Procedure: CORONARY STENT INTERVENTION;  Surgeon: Lyn Records, MD;  Location: [facility];  Service: Cardiovascular;  Laterality: N/A;   CORONARY STENT INTERVENTION N/A 03/28/2021   Procedure: CORONARY STENT INTERVENTION;  Surgeon: Tonny Bollman, MD;  Location: [facility] INVASIVE CV LAB;  Service: Cardiovascular;  Laterality: N/A;   CORONARY STENT INTERVENTION N/A 11/20/2023   Procedure: CORONARY STENT INTERVENTION;  Surgeon: Tonny Bollman, MD;  Location: [facility] INVASIVE CV LAB;  Service: Cardiovascular;  Laterality: N/A;   HEMORROIDECTOMY     KNEE ARTHROSCOPY     LEFT HEART CATH AND CORONARY ANGIOGRAPHY N/A 09/20/2020   Procedure: LEFT HEART CATH AND CORONARY ANGIOGRAPHY;  Surgeon: Lyn Records, MD;  Location: [facility];  Service: Cardiovascular;  Laterality: N/A;   LEFT HEART CATH AND CORONARY ANGIOGRAPHY N/A 03/28/2021   Procedure: LEFT HEART CATH AND CORONARY ANGIOGRAPHY;  Surgeon: Tonny Bollman, MD;  Location: [facility] INVASIVE CV LAB;  Service: Cardiovascular;  Laterality: N/A;   LEFT HEART CATH AND CORONARY ANGIOGRAPHY N/A 11/20/2023   Procedure: LEFT HEART CATH AND CORONARY ANGIOGRAPHY;  Surgeon: Tonny Bollman, MD;  Location: [facility] INVASIVE CV LAB;  Service: Cardiovascular;  Laterality: N/A;   LUMBAR DISC SURGERY     POLYPECTOMY  10/17/2021   Procedure: POLYPECTOMY;  Surgeon: Rachael Fee, MD;  Location: [facility];  Service: Endoscopy;;   SHOULDER ARTHROSCOPY WITH DISTAL CLAVICLE RESECTION Left 01/20/2023   Procedure: SHOULDER ARTHROSCOPY WITH DISTAL CLAVICLE EXCISION;  Surgeon: Teryl Lucy, MD;  Location: [facility];  Service: Orthopedics;  Laterality: Left;   SHOULDER ARTHROSCOPY WITH ROTATOR CUFF REPAIR AND SUBACROMIAL DECOMPRESSION Left 01/20/2023    Procedure: SHOULDER ARTHROSCOPY WITH ROTATOR CUFF REPAIR AND SUBACROMIAL DECOMPRESSION;  Surgeon: Teryl Lucy, MD;  Location: [facility];  Service: Orthopedics;  Laterality: Left;   TONSILLECTOMY AND ADENOIDECTOMY     Social History   Socioeconomic History   Marital status: Single    Spouse name: Not on file   Number of children: Not on file   Years of education: Not on file   Highest education level: Not on file  Occupational History   Occupation: Security guard    Employer: [employer]  Tobacco Use   Smoking status: Former    Types: E-cigarettes   Smokeless tobacco: Former    Quit date: 2022  Vaping Use   Vaping status: Former  Substance and Sexual Activity   Alcohol use: No   Drug use: Not Currently   Sexual activity: Not Currently  Other Topics Concern   Not on file  Social History Narrative   Not on file   Social Drivers of Health   Financial Resource Strain: Not on file  Food Insecurity: No Food Insecurity (11/21/2023)   Hunger Vital Sign    Worried About Running Out of Food in the Last Year: Never true    Ran Out of Food in the Last Year: Never true  Transportation Needs: No Transportation Needs (11/21/2023)   PRAPARE - Administrator, Civil Service (Medical): No    Lack of Transportation (Non-Medical): No  Physical Activity: Not on file  Stress: Not on file  Social Connections: Not on file  Intimate Partner Violence: Not At Risk (11/21/2023)   Humiliation, Afraid, Rape, and Kick questionnaire    Fear of Current or Ex-Partner: No    Emotionally Abused: No    Physically Abused: No    Sexually Abused: No   Current Outpatient Medications on File Prior to Visit  Medication Sig Dispense Refill   albuterol (VENTOLIN HFA) 108 (90 Base) MCG/ACT inhaler Inhale 1-2 puffs into the lungs every 6 (six) hours as needed for wheezing or shortness of breath. 18 g 6   aspirin EC 81 MG tablet Take 1 tablet (81 mg total) by mouth daily. Swallow whole. (Patient not  taking: Reported on 11/20/2023)     atorvastatin (LIPITOR) 80 MG tablet TAKE 1 TABLET BY MOUTH EVERY DAY 30 tablet 11   cimetidine (TAGAMET HB) 200 MG tablet Take 1 tablet (200 mg total) by mouth daily. 30 tablet 3   clopidogrel (PLAVIX) 75 MG tablet Take 1 tablet (75 mg total) by mouth daily with breakfast. 30 tablet 5   glipiZIDE (GLUCOTROL) 5 MG tablet Take 1 tablet (5 mg total) by mouth 2 (two) times daily before a meal. 180 tablet 3   metFORMIN (GLUCOPHAGE-XR) 500 MG 24 hr tablet Take 4 tablets (2,000 mg total) by mouth daily. 360 tablet 3   methocarbamol (ROBAXIN) 500 MG tablet Take 1 tablet (500 mg total) by mouth every 8 (eight) hours as needed for muscle spasms. 30 tablet 0   metoprolol tartrate (LOPRESSOR) 100 MG tablet Take 1 tablet (100 mg total) by mouth 2 (two) times daily. 180 tablet 3   nitroGLYCERIN (NITROSTAT) 0.4 MG SL tablet Place 1 tablet (0.4 mg total) under the tongue every 5 (five) minutes as needed for chest pain. 25 tablet 5   OVER THE COUNTER MEDICATION daily. Goody's Extra Strength Headache Powder     pantoprazole (PROTONIX) 40 MG tablet Take 1 tablet (40 mg total) by mouth daily. 30 tablet 11   ranolazine (RANEXA) 500 MG 12 hr tablet Take 2 tablets (1,000 mg total) by mouth 2 (two) times daily. 90 tablet 3   No current facility-administered medications on file prior to visit.   Allergies  Allergen Reactions   Bee Venom Anaphylaxis   Penicillins Anaphylaxis    Did it involve swelling of the face/tongue/throat, SOB, or low BP? yes Did it involve sudden or severe rash/hives, skin peeling, or any reaction on the inside of your mouth or nose? unknown Did you need to seek medical attention at a hospital or doctor's office? yes When did it last happen?      1967 If all above answers are "NO", may proceed with cephalosporin use.    Procaine Anaphylaxis   Isosorbide  Other (See Comments)    Caused migraines   Tape Itching and Other (See Comments)    Certain medical  tapes make the patient's skin ITCH   Family History  Problem Relation Age of Onset   Diabetes type II Other    Coronary artery disease Other    Bipolar disorder Other    Lung cancer Father    CAD Mother 42   PE: BP 120/70   Pulse 81   Ht 5\' 8"  (1.727 m)   Wt 238 lb 12.8 oz (108.3 kg)   SpO2 97%   BMI 36.31 kg/m  Wt Readings from Last 10 Encounters:  03/03/24 238 lb 12.8 oz (108.3 kg)  02/26/24 237 lb 12.8 oz (107.9 kg)  12/08/23 246 lb 3.2 oz (111.7 kg)  11/20/23 241 lb 13.5 oz (109.7 kg)  11/20/23 244 lb (110.7 kg)  10/27/23 249 lb (112.9 kg)  08/21/23 242 lb 3.2 oz (109.9 kg)  06/23/23 238 lb (108 kg)  03/05/23 243 lb 12.8 oz (110.6 kg)  02/13/23 244 lb 9.6 oz (110.9 kg)   Constitutional: overweight, in NAD Eyes: no exophthalmos ENT: no thyromegaly, no cervical lymphadenopathy Cardiovascular: RRR, No MRG Respiratory: CTA B Musculoskeletal: no deformities Skin:  no rashes Neurological: + mild tremor with outstretched hands  ASSESSMENT: 1. DM2, noninsulin-dependent, but off insulin now, uncontrolled, with complications - CAD, s/p NSTEMI (last 11/20/2023) - cerebro-vascular disease, s/p TIA - PN  2. HL  3.  Obesity class III  PLAN:  1. Patient with longstanding, uncontrolled, type 2 diabetes, on oral antidiabetic regimen with metformin and now back on GLP-1 receptor agonist.  Ozempic due to price.  We tried patient assistance but he was denied due to the fact that he had commercial insurance.  He is relying on samples now.  We did discuss that if we are unable to provide him with Ozempic, to take glipizide twice a day.  At last visit, HbA1c was higher, at 8.5%. -He is currently on metformin, but he mentions that he is also taking the glipizide twice a day.  No lows except for a blood sugar of 79 recently.  He felt this is a low.  He is on low-dose Ozempic, 0.25 mg weekly due to GI side effects (reflux).  Unfortunately, he is not able to afford this.  His insurance  covers it, but his co-pay is approximately $800 of pain.  At today's visit I gave him our last sample pen.  I also sent a prescription for Trulicity to try to get it covered by his insurance again.  I gave him a list of other GLP-1 or GLP-1/GIP receptor agonist to check their price on good Rx.  If he is not able to obtain any of them, we discussed about increasing the glipizide dose but this is not ideal. - I suggested to:  Patient Instructions  Please continue: - Metformin ER 1000 mg 2x daily - Glipizide 5 mg 2x a day 30 min before meals - Ozempic 0.25 mg weekly/Trulicity 1.5 mg weekly  If you do not have Ozempic/Trulicity, try to increase: - Glipizide 10 mg 2x a day 30 min before meals  Look up your copay for: - Mounjaro (weekly) - Bydureon (weekly) - Victoza (once day) - Byetta (2x a day)  Please return in 4 months.  - we checked his HbA1c: 7.0% (much better) - advised to check sugars at different times of the day - 1x a day, rotating check times - advised for yearly eye exams >>  he is not UTD (not affordable) - return to clinic in 4 months  2. HL -Latest lipid panel was reviewed from 08/2023: At goal with the exception of a low HDL: Lab Results  Component Value Date   CHOL 97 (L) 08/21/2023   HDL 32 (L) 08/21/2023   LDLCALC 51 08/21/2023   TRIG 65 08/21/2023   CHOLHDL 3.0 08/21/2023  -He continues Lipitor 80 mg daily without side effects  3.  Obesity class III -She lost 18 pounds after starting Ozempic but at last visit he came off due to insurance coverage and gained 11 pounds back -We restarted Ozempic and he is getting samples now he was able to again lose weight (11 pounds) on the GLP-1 receptor agonist despite the fact that we are using a low dose for him due to GI side effects.  I am really hoping that we can continue a GLP-1 receptor agonist for him.  Carlus Pavlov, MD PhD [facility] Endocrinology

## 2024-03-14 ENCOUNTER — Ambulatory Visit: Payer: 59 | Admitting: Cardiovascular Disease

## 2024-03-18 ENCOUNTER — Ambulatory Visit (HOSPITAL_BASED_OUTPATIENT_CLINIC_OR_DEPARTMENT_OTHER)
Admission: RE | Admit: 2024-03-18 | Discharge: 2024-03-18 | Disposition: A | Discharge status: Home / Self Care | Source: Ambulatory Visit | Attending: Cardiovascular Disease | Admitting: Cardiovascular Disease

## 2024-03-18 DIAGNOSIS — R911 Solitary pulmonary nodule: Secondary | ICD-10-CM | POA: Diagnosis present

## 2024-03-24 ENCOUNTER — Observation Stay (HOSPITAL_COMMUNITY)
Admission: EM | Admit: 2024-03-24 | Discharge: 2024-03-25 | Disposition: A | Discharge status: Home / Self Care | Payer: Self-pay | Attending: Internal Medicine | Admitting: Internal Medicine

## 2024-03-24 ENCOUNTER — Emergency Department (HOSPITAL_COMMUNITY): Payer: Self-pay

## 2024-03-24 ENCOUNTER — Other Ambulatory Visit: Payer: Self-pay

## 2024-03-24 DIAGNOSIS — Z955 Presence of coronary angioplasty implant and graft: Secondary | ICD-10-CM | POA: Insufficient documentation

## 2024-03-24 DIAGNOSIS — F17200 Nicotine dependence, unspecified, uncomplicated: Secondary | ICD-10-CM | POA: Insufficient documentation

## 2024-03-24 DIAGNOSIS — E785 Hyperlipidemia, unspecified: Secondary | ICD-10-CM | POA: Diagnosis present

## 2024-03-24 DIAGNOSIS — Z6839 Body mass index (BMI) 39.0-39.9, adult: Secondary | ICD-10-CM | POA: Insufficient documentation

## 2024-03-24 DIAGNOSIS — I25110 Atherosclerotic heart disease of native coronary artery with unstable angina pectoris: Principal | ICD-10-CM | POA: Insufficient documentation

## 2024-03-24 DIAGNOSIS — I10 Essential (primary) hypertension: Secondary | ICD-10-CM | POA: Diagnosis present

## 2024-03-24 DIAGNOSIS — Z8673 Personal history of transient ischemic attack (TIA), and cerebral infarction without residual deficits: Secondary | ICD-10-CM | POA: Insufficient documentation

## 2024-03-24 DIAGNOSIS — Z79899 Other long term (current) drug therapy: Secondary | ICD-10-CM | POA: Insufficient documentation

## 2024-03-24 DIAGNOSIS — E669 Obesity, unspecified: Secondary | ICD-10-CM | POA: Diagnosis present

## 2024-03-24 DIAGNOSIS — I200 Unstable angina: Secondary | ICD-10-CM | POA: Diagnosis present

## 2024-03-24 DIAGNOSIS — R079 Chest pain, unspecified: Principal | ICD-10-CM

## 2024-03-24 DIAGNOSIS — E119 Type 2 diabetes mellitus without complications: Secondary | ICD-10-CM

## 2024-03-24 DIAGNOSIS — K219 Gastro-esophageal reflux disease without esophagitis: Secondary | ICD-10-CM | POA: Diagnosis present

## 2024-03-24 DIAGNOSIS — I25119 Atherosclerotic heart disease of native coronary artery with unspecified angina pectoris: Secondary | ICD-10-CM | POA: Diagnosis present

## 2024-03-24 DIAGNOSIS — Z7984 Long term (current) use of oral hypoglycemic drugs: Secondary | ICD-10-CM | POA: Insufficient documentation

## 2024-03-24 DIAGNOSIS — Z7982 Long term (current) use of aspirin: Secondary | ICD-10-CM | POA: Insufficient documentation

## 2024-03-24 DIAGNOSIS — Z87891 Personal history of nicotine dependence: Secondary | ICD-10-CM

## 2024-03-24 LAB — CBC
HCT: 40.2 % (ref 39.0–52.0)
Hemoglobin: 13.1 g/dL (ref 13.0–17.0)
MCH: 28.8 pg (ref 26.0–34.0)
MCHC: 32.6 g/dL (ref 30.0–36.0)
MCV: 88.4 fL (ref 80.0–100.0)
Platelets: 227 10*3/uL (ref 150–400)
RBC: 4.55 MIL/uL (ref 4.22–5.81)
RDW: 13.4 % (ref 11.5–15.5)
WBC: 8 10*3/uL (ref 4.0–10.5)
nRBC: 0 % (ref 0.0–0.2)

## 2024-03-24 LAB — BASIC METABOLIC PANEL WITH GFR
Anion gap: 10 (ref 5–15)
BUN: 11 mg/dL (ref 8–23)
CO2: 24 mmol/L (ref 22–32)
Calcium: 8.8 mg/dL — ABNORMAL LOW (ref 8.9–10.3)
Chloride: 105 mmol/L (ref 98–111)
Creatinine, Ser: 0.81 mg/dL (ref 0.61–1.24)
GFR, Estimated: >60 mL/min (ref >60)
Glucose, Bld: 173 mg/dL — ABNORMAL HIGH (ref 70–99)
Potassium: 3.9 mmol/L (ref 3.5–5.1)
Sodium: 139 mmol/L (ref 135–145)

## 2024-03-24 LAB — TROPONIN I (HIGH SENSITIVITY)
Troponin I (High Sensitivity): 8 ng/L (ref <18)
Troponin I (High Sensitivity): 7 ng/L (ref <18)

## 2024-03-24 MED ORDER — ONDANSETRON 4 MG PO TBDP
4.0000 mg | ORAL_TABLET | Freq: Once | ORAL | Status: AC
  Administered 2024-03-24: 4 mg via ORAL
  Filled 2024-03-24: qty 1

## 2024-03-24 MED ORDER — ACETAMINOPHEN-CODEINE 300-30 MG PO TABS
1.0000 | ORAL_TABLET | Freq: Once | ORAL | Status: AC
  Administered 2024-03-24: 1 via ORAL
  Filled 2024-03-24: qty 1

## 2024-03-24 NOTE — ED Triage Notes (Signed)
 Patient BIB [facility] from home for sharp CP starting around 1730, patient took 3 doses of home nitroglycerin prior to EMS arrival EMS administered 324mg  aspirin and 1 dose of nitroglycerin. Patient reported minimal improvement with meds.   BP 162/99, HR 74, 98% RA, RR 16

## 2024-03-24 NOTE — ED Notes (Signed)
 CCMD called.

## 2024-03-24 NOTE — ED Provider Triage Note (Signed)
 Emergency Medicine Provider Triage Evaluation Note  Jerry Shaffer , a 63 y.o. male  was evaluated in triage.  Pt complains of chest pain starting at 5:30 PM.  Reports this a pressure sensation on the left side.  Does not radiate.  Patient reports he did take nitroglycerin with minimal improvement.  Came to the ER for further evaluation.  Reports NSTEMI back in December.  Review of Systems  Positive: Left-sided chest pain Negative: Fever, cough  Physical Exam  There were no vitals taken for this visit. Gen:   Awake, no distress   Resp:  Normal effort  MSK:   Moves extremities without difficulty  Other:  No leg swelling  Medical Decision Making  Medically screening exam initiated at 8:10 PM.  Appropriate orders placed.  Jerry Shaffer was informed that the remainder of the evaluation will be completed by another provider, this initial triage assessment does not replace that evaluation, and the importance of remaining in the ED until their evaluation is complete.  Cardiac workup initiated.  Patient offered morphine but declines and says this "does not work".  Patient given Tylenol 3 for pain.  Initial EKG does not show any acute changes from previous.   Durwin Glaze, MD 03/24/24 2012

## 2024-03-25 ENCOUNTER — Encounter (HOSPITAL_COMMUNITY): Payer: Self-pay | Admitting: Internal Medicine

## 2024-03-25 ENCOUNTER — Observation Stay (HOSPITAL_COMMUNITY): Payer: Self-pay

## 2024-03-25 ENCOUNTER — Encounter (HOSPITAL_COMMUNITY)
Admission: EM | Disposition: A | Discharge status: Home / Self Care | Payer: Self-pay | Source: Home / Self Care | Attending: Emergency Medicine

## 2024-03-25 ENCOUNTER — Other Ambulatory Visit: Payer: Self-pay

## 2024-03-25 DIAGNOSIS — R079 Chest pain, unspecified: Secondary | ICD-10-CM

## 2024-03-25 DIAGNOSIS — K219 Gastro-esophageal reflux disease without esophagitis: Secondary | ICD-10-CM

## 2024-03-25 DIAGNOSIS — I200 Unstable angina: Secondary | ICD-10-CM

## 2024-03-25 DIAGNOSIS — E669 Obesity, unspecified: Secondary | ICD-10-CM

## 2024-03-25 DIAGNOSIS — I10 Essential (primary) hypertension: Secondary | ICD-10-CM

## 2024-03-25 DIAGNOSIS — E119 Type 2 diabetes mellitus without complications: Secondary | ICD-10-CM

## 2024-03-25 DIAGNOSIS — Z87891 Personal history of nicotine dependence: Secondary | ICD-10-CM

## 2024-03-25 DIAGNOSIS — I25118 Atherosclerotic heart disease of native coronary artery with other forms of angina pectoris: Secondary | ICD-10-CM

## 2024-03-25 DIAGNOSIS — E785 Hyperlipidemia, unspecified: Secondary | ICD-10-CM

## 2024-03-25 DIAGNOSIS — I25119 Atherosclerotic heart disease of native coronary artery with unspecified angina pectoris: Secondary | ICD-10-CM

## 2024-03-25 HISTORY — PX: LEFT HEART CATH AND CORONARY ANGIOGRAPHY: CATH118249

## 2024-03-25 LAB — ECHOCARDIOGRAM COMPLETE
AR max vel: 2.74 cm2
AV Peak grad: 6.4 mmHg
Ao pk vel: 1.26 m/s
Area-P 1/2: 2.56 cm2
Height: 68 in
MV VTI: 3.35 cm2
S' Lateral: 3 cm
Weight: 3820.13 [oz_av]

## 2024-03-25 LAB — HIV ANTIBODY (ROUTINE TESTING W REFLEX): HIV Screen 4th Generation wRfx: NONREACTIVE

## 2024-03-25 LAB — HEPATIC FUNCTION PANEL
ALT: 20 U/L (ref 0–44)
AST: 17 U/L (ref 15–41)
Albumin: 3.4 g/dL — ABNORMAL LOW (ref 3.5–5.0)
Alkaline Phosphatase: 73 U/L (ref 38–126)
Bilirubin, Direct: <0.1 mg/dL (ref 0.0–0.2)
Total Bilirubin: 0.7 mg/dL (ref 0.0–1.2)
Total Protein: 5.6 g/dL — ABNORMAL LOW (ref 6.5–8.1)

## 2024-03-25 LAB — LIPID PANEL
Cholesterol: 91 mg/dL (ref 0–200)
HDL: 29 mg/dL — ABNORMAL LOW (ref >40)
LDL Cholesterol: 47 mg/dL (ref 0–99)
Total CHOL/HDL Ratio: 3.1 ratio
Triglycerides: 74 mg/dL (ref <150)
VLDL: 15 mg/dL (ref 0–40)

## 2024-03-25 LAB — CBG MONITORING, ED
Glucose-Capillary: 143 mg/dL — ABNORMAL HIGH (ref 70–99)
Glucose-Capillary: 151 mg/dL — ABNORMAL HIGH (ref 70–99)

## 2024-03-25 LAB — HEMOGLOBIN A1C
Hgb A1c MFr Bld: 6.4 % — ABNORMAL HIGH (ref 4.8–5.6)
Mean Plasma Glucose: 136.98 mg/dL

## 2024-03-25 LAB — GLUCOSE, CAPILLARY: Glucose-Capillary: 110 mg/dL — ABNORMAL HIGH (ref 70–99)

## 2024-03-25 SURGERY — LEFT HEART CATH AND CORONARY ANGIOGRAPHY
Anesthesia: LOCAL

## 2024-03-25 MED ORDER — HEPARIN (PORCINE) IN NACL 2000-0.9 UNIT/L-% IV SOLN
INTRAVENOUS | Status: DC | PRN
  Administered 2024-03-25: 1000 mL

## 2024-03-25 MED ORDER — LIDOCAINE HCL (PF) 1 % IJ SOLN
INTRAMUSCULAR | Status: DC | PRN
  Administered 2024-03-25: 2 mL

## 2024-03-25 MED ORDER — INSULIN ASPART 100 UNIT/ML IJ SOLN: 0.0000 [IU] | Freq: Three times a day (TID) | INTRAMUSCULAR | Status: DC

## 2024-03-25 MED ORDER — VERAPAMIL HCL 2.5 MG/ML IV SOLN
INTRAVENOUS | Status: AC
Start: 2024-03-25 — End: ?
  Filled 2024-03-25: qty 2

## 2024-03-25 MED ORDER — ENOXAPARIN SODIUM 40 MG/0.4ML IJ SOSY: 40.0000 mg | PREFILLED_SYRINGE | INTRAMUSCULAR | Status: DC

## 2024-03-25 MED ORDER — IOHEXOL 350 MG/ML SOLN
INTRAVENOUS | Status: DC | PRN
  Administered 2024-03-25: 40 mL

## 2024-03-25 MED ORDER — HEPARIN SODIUM (PORCINE) 1000 UNIT/ML IJ SOLN
INTRAMUSCULAR | Status: DC | PRN
  Administered 2024-03-25: 5000 [IU] via INTRAVENOUS

## 2024-03-25 MED ORDER — PANTOPRAZOLE SODIUM 40 MG PO TBEC
40.0000 mg | DELAYED_RELEASE_TABLET | Freq: Every day | ORAL | Status: DC
  Administered 2024-03-25: 40 mg via ORAL
  Filled 2024-03-25: qty 1

## 2024-03-25 MED ORDER — SODIUM CHLORIDE 0.9 % IV SOLN: INTRAVENOUS | Status: DC

## 2024-03-25 MED ORDER — MIDAZOLAM HCL 2 MG/2ML IJ SOLN
INTRAMUSCULAR | Status: DC | PRN
  Administered 2024-03-25: 2 mg via INTRAVENOUS

## 2024-03-25 MED ORDER — FENTANYL CITRATE (PF) 100 MCG/2ML IJ SOLN
INTRAMUSCULAR | Status: DC | PRN
  Administered 2024-03-25: 50 ug via INTRAVENOUS

## 2024-03-25 MED ORDER — ONDANSETRON HCL 4 MG/2ML IJ SOLN
4.0000 mg | Freq: Once | INTRAMUSCULAR | Status: AC
  Administered 2024-03-25: 4 mg via INTRAVENOUS
  Filled 2024-03-25: qty 2

## 2024-03-25 MED ORDER — HYDRALAZINE HCL 20 MG/ML IJ SOLN: 10.0000 mg | INTRAMUSCULAR | Status: DC | PRN

## 2024-03-25 MED ORDER — HEPARIN SODIUM (PORCINE) 1000 UNIT/ML IJ SOLN
INTRAMUSCULAR | Status: AC
  Filled 2024-03-25: qty 10

## 2024-03-25 MED ORDER — INSULIN ASPART 100 UNIT/ML IJ SOLN: 0.0000 [IU] | Freq: Every day | INTRAMUSCULAR | Status: DC

## 2024-03-25 MED ORDER — ALBUTEROL SULFATE (2.5 MG/3ML) 0.083% IN NEBU: 2.5000 mg | INHALATION_SOLUTION | Freq: Four times a day (QID) | RESPIRATORY_TRACT | Status: DC | PRN

## 2024-03-25 MED ORDER — ONDANSETRON HCL 4 MG/2ML IJ SOLN: 4.0000 mg | Freq: Four times a day (QID) | INTRAMUSCULAR | Status: DC | PRN

## 2024-03-25 MED ORDER — ATORVASTATIN CALCIUM 80 MG PO TABS
80.0000 mg | ORAL_TABLET | Freq: Every evening | ORAL | Status: DC
  Administered 2024-03-25: 80 mg via ORAL
  Filled 2024-03-25: qty 1

## 2024-03-25 MED ORDER — FENTANYL CITRATE (PF) 100 MCG/2ML IJ SOLN
INTRAMUSCULAR | Status: AC
  Filled 2024-03-25: qty 2

## 2024-03-25 MED ORDER — SODIUM CHLORIDE 0.9% FLUSH
3.0000 mL | Freq: Two times a day (BID) | INTRAVENOUS | Status: DC
  Administered 2024-03-25: 3 mL via INTRAVENOUS

## 2024-03-25 MED ORDER — ONDANSETRON HCL 4 MG PO TABS: 4.0000 mg | ORAL_TABLET | Freq: Four times a day (QID) | ORAL | Status: DC | PRN

## 2024-03-25 MED ORDER — ACETAMINOPHEN 650 MG RE SUPP: 650.0000 mg | Freq: Four times a day (QID) | RECTAL | Status: DC | PRN

## 2024-03-25 MED ORDER — RANOLAZINE ER 500 MG PO TB12
1000.0000 mg | ORAL_TABLET | Freq: Two times a day (BID) | ORAL | Status: DC
Start: 2024-03-25 — End: 2024-03-26
  Administered 2024-03-25: 1000 mg via ORAL
  Filled 2024-03-25: qty 2

## 2024-03-25 MED ORDER — VERAPAMIL HCL 2.5 MG/ML IV SOLN
INTRAVENOUS | Status: DC | PRN
  Administered 2024-03-25: 10 mL via INTRA_ARTERIAL

## 2024-03-25 MED ORDER — LIDOCAINE HCL (PF) 1 % IJ SOLN
INTRAMUSCULAR | Status: AC
  Filled 2024-03-25: qty 30

## 2024-03-25 MED ORDER — PERFLUTREN LIPID MICROSPHERE
1.0000 mL | INTRAVENOUS | Status: AC | PRN
  Administered 2024-03-25: 2 mL via INTRAVENOUS

## 2024-03-25 MED ORDER — BUTALBITAL-APAP-CAFFEINE 50-325-40 MG PO TABS
1.0000 | ORAL_TABLET | Freq: Four times a day (QID) | ORAL | Status: DC | PRN
  Administered 2024-03-25: 1 via ORAL
  Filled 2024-03-25: qty 1

## 2024-03-25 MED ORDER — ACETAMINOPHEN 325 MG PO TABS: 650.0000 mg | ORAL_TABLET | Freq: Four times a day (QID) | ORAL | Status: DC | PRN

## 2024-03-25 MED ORDER — CLOPIDOGREL BISULFATE 75 MG PO TABS
75.0000 mg | ORAL_TABLET | Freq: Every day | ORAL | Status: DC
  Administered 2024-03-25: 75 mg via ORAL
  Filled 2024-03-25: qty 1

## 2024-03-25 MED ORDER — MIDAZOLAM HCL 2 MG/2ML IJ SOLN
INTRAMUSCULAR | Status: AC
  Filled 2024-03-25: qty 2

## 2024-03-25 MED ORDER — SODIUM CHLORIDE 0.9% FLUSH: 3.0000 mL | INTRAVENOUS | Status: DC | PRN

## 2024-03-25 MED ORDER — ASPIRIN 81 MG PO TBEC
81.0000 mg | DELAYED_RELEASE_TABLET | Freq: Every day | ORAL | Status: DC
  Administered 2024-03-25: 81 mg via ORAL
  Filled 2024-03-25: qty 1

## 2024-03-25 MED ORDER — METOPROLOL TARTRATE 100 MG PO TABS
100.0000 mg | ORAL_TABLET | Freq: Two times a day (BID) | ORAL | Status: DC
  Administered 2024-03-25: 100 mg via ORAL
  Filled 2024-03-25: qty 4

## 2024-03-25 MED ORDER — LABETALOL HCL 5 MG/ML IV SOLN: 10.0000 mg | INTRAVENOUS | Status: DC | PRN

## 2024-03-25 MED ORDER — SODIUM CHLORIDE 0.9 % IV SOLN: 250.0000 mL | INTRAVENOUS | Status: DC | PRN

## 2024-03-25 MED ORDER — SODIUM CHLORIDE 0.9% FLUSH: 3.0000 mL | Freq: Two times a day (BID) | INTRAVENOUS | Status: DC

## 2024-03-25 MED ORDER — NITROGLYCERIN 2 % TD OINT
0.5000 [in_us] | TOPICAL_OINTMENT | Freq: Once | TRANSDERMAL | Status: AC
Start: 2024-03-25 — End: 2024-03-25
  Administered 2024-03-25: 0.5 [in_us] via TOPICAL
  Filled 2024-03-25: qty 1

## 2024-03-25 SURGICAL SUPPLY — 9 items
CATH INFINITI AMBI 5FR TG (CATHETERS) IMPLANT
CATH INFINITI JR4 5F (CATHETERS) IMPLANT
DEVICE RAD COMP TR BAND LRG (VASCULAR PRODUCTS) IMPLANT
GLIDESHEATH SLEND SS 6F .021 (SHEATH) IMPLANT
GUIDEWIRE INQWIRE 1.5J.035X260 (WIRE) IMPLANT
INQWIRE 1.5J .035X260CM (WIRE) ×1 IMPLANT
PACK CARDIAC CATHETERIZATION (CUSTOM PROCEDURE TRAY) ×1 IMPLANT
SET ATX-X65L (MISCELLANEOUS) IMPLANT
SHEATH PROBE COVER 6X72 (BAG) IMPLANT

## 2024-03-25 NOTE — Plan of Care (Signed)
 AAOx4, calm, cooperative, denies pain, SOB, dizziness, lightheadedness. R radial site clean intact. Air is removed, free of complications. TR band removed. Pt received discharge teaching and verbalized understanding. Ambulatory, on RA. Pt is going home, post discharge instructions and follow up appointments discussed.  Problem: Education: Goal: Ability to describe self-care measures that may prevent or decrease complications (Diabetes Survival Skills Education) will improve Outcome: Completed/Met Goal: Individualized Educational Video(s) Outcome: Completed/Met   Problem: Coping: Goal: Ability to adjust to condition or change in health will improve Outcome: Completed/Met   Problem: Fluid Volume: Goal: Ability to maintain a balanced intake and output will improve Outcome: Completed/Met   Problem: Health Behavior/Discharge Planning: Goal: Ability to identify and utilize available resources and services will improve Outcome: Completed/Met Goal: Ability to manage health-related needs will improve Outcome: Completed/Met   Problem: Metabolic: Goal: Ability to maintain appropriate glucose levels will improve Outcome: Completed/Met   Problem: Nutritional: Goal: Maintenance of adequate nutrition will improve Outcome: Completed/Met Goal: Progress toward achieving an optimal weight will improve Outcome: Completed/Met   Problem: Skin Integrity: Goal: Risk for impaired skin integrity will decrease Outcome: Completed/Met   Problem: Tissue Perfusion: Goal: Adequacy of tissue perfusion will improve Outcome: Completed/Met   Problem: Education: Goal: Knowledge of General Education information will improve Description: Including pain rating scale, medication(s)/side effects and non-pharmacologic comfort measures Outcome: Completed/Met   Problem: Health Behavior/Discharge Planning: Goal: Ability to manage health-related needs will improve Outcome: Completed/Met   Problem: Clinical  Measurements: Goal: Ability to maintain clinical measurements within normal limits will improve Outcome: Completed/Met Goal: Will remain free from infection Outcome: Completed/Met Goal: Diagnostic test results will improve Outcome: Completed/Met Goal: Respiratory complications will improve Outcome: Completed/Met Goal: Cardiovascular complication will be avoided Outcome: Completed/Met   Problem: Activity: Goal: Risk for activity intolerance will decrease Outcome: Completed/Met   Problem: Nutrition: Goal: Adequate nutrition will be maintained Outcome: Completed/Met   Problem: Coping: Goal: Level of anxiety will decrease Outcome: Completed/Met   Problem: Elimination: Goal: Will not experience complications related to bowel motility Outcome: Completed/Met Goal: Will not experience complications related to urinary retention Outcome: Completed/Met   Problem: Pain Managment: Goal: General experience of comfort will improve and/or be controlled Outcome: Completed/Met   Problem: Safety: Goal: Ability to remain free from injury will improve Outcome: Completed/Met   Problem: Skin Integrity: Goal: Risk for impaired skin integrity will decrease Outcome: Completed/Met   Problem: Education: Goal: Understanding of CV disease, CV risk reduction, and recovery process will improve Outcome: Completed/Met Goal: Individualized Educational Video(s) Outcome: Completed/Met   Problem: Activity: Goal: Ability to return to baseline activity level will improve Outcome: Completed/Met   Problem: Cardiovascular: Goal: Ability to achieve and maintain adequate cardiovascular perfusion will improve Outcome: Completed/Met Goal: Vascular access site(s) Level 0-1 will be maintained Outcome: Completed/Met   Problem: Health Behavior/Discharge Planning: Goal: Ability to safely manage health-related needs after discharge will improve Outcome: Completed/Met

## 2024-03-25 NOTE — H&P (Signed)
 History and Physical    Patient: Jerry Shaffer WUJ:[id number] DOB: [birth year] DOA: 03/24/2024 DOS: the patient was seen and examined on 03/25/2024 PCP: Patient, No Pcp Per  Patient coming from: Home via EMS  Chief Complaint:  Chief Complaint  Patient presents with   Chest Pain   HPI: Jerry Shaffer is a 63 y.o. male with medical history significant of CAD s/p multiple stents most recently OM2 in December 2024, ADHD, chronic smoker, migraine, headache, atrial flutter status post ablation 08/2022, hyperlipidemia, essential hypertension, DM type II, tobacco abuse, and obesity who presents with complaints of chest pain.  He has been experiencing chest pain since yesterday at approximately 5:00 PM, which began upon waking due to his third shift work schedule. The pain is described as a constant stabbing sensation on the left side of his chest, akin to being 'stabbed with an ice pick.' It does not radiate to other areas such as the back or shoulder. 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 In December, he was hospitalized and had a stent placed. The current pain is not as severe as the pain experienced during that time, but it was significant enough to prompt him to take nitroglycerin. He took two nitroglycerin tablets five minutes apart, which did not alleviate the pain. He then tried an antacid medication prescribed by his cardiologist, which also did not help. Following this, his neighbor, a Engineer, civil (consulting), called emergency services, leading to his current hospitalization. No heart palpitations or racing heart sensations. He is currently using a nitroglycerin patch, which has reduced the severity of the pain.  He has a history of smoking but quit years ago. He takes BC powder two to three times a week, sometimes more, and is  aware of the associated risks of ulcers. He has not been compliant with aspirin and Plavix therapy due to frequent use of BC powder and Goody's, which he takes for pain relief. This noncompliance increases the risk of stent thrombosis and gastrointestinal ulcers.  He reports gastrointestinal disturbances, specifically altered bowel habits, attributed to Ozempic use. No black or dark stools, indicating no current gastrointestinal bleeding. He describes his bowel movements as 'Technicolor.'   En route with EMS patient had been given 324 mg of aspirin and a additional dose of nitroglycerin with minimal improvement in symptoms.  In the emergency department patient was noted to be afebrile with blood pressures elevated up to 181/99, and all other vital signs relatively maintained.  EKG did not reveal any significant ischemic changes.  Labs significant for sensitivity troponins negative x 2, glucose 173, LDL 47, and hemoglobin A1c 6.4.  Chest x-ray noted no acute abnormality.  Patient has been given Zofran, acetaminophen, and nitroglycerin ointment.  Review of Systems: As mentioned in the history of present illness. All other systems reviewed and are negative. Past Medical History:  Diagnosis Date   Atrial flutter (HCC)    Bipolar 1 disorder (HCC)    Coronary artery disease    Diabetes mellitus    Dyslipidemia    Emphysema    GERD (gastroesophageal reflux disease)    History of kidney stones    Hypertension    Mental disorder    BIPOLAR DISORDER   Migraine    NSTEMI (non-ST elevated myocardial infarction) (HCC) 09/20/2020   Pneumonia    PONV (  postoperative nausea and vomiting)    Renal disorder    Shortness of breath    Stroke (HCC)    Transient ischemic attack (TIA)    Past Surgical History:  Procedure Laterality Date   A-FLUTTER ABLATION N/A 09/08/2022   Procedure: A-FLUTTER ABLATION;  Surgeon: Mealor, Roberts Gaudy, MD;  Location: [facility];  Service: Cardiovascular;  Laterality:  N/A;   COLONOSCOPY WITH PROPOFOL N/A 10/17/2021   Procedure: COLONOSCOPY WITH PROPOFOL;  Surgeon: Rachael Fee, MD;  Location: [facility];  Service: Endoscopy;  Laterality: N/A;   CORONARY ANGIOGRAPHY  09/20/2020   CORONARY STENT INTERVENTION  09/20/2020   CORONARY STENT INTERVENTION   CORONARY STENT INTERVENTION N/A 09/20/2020   Procedure: CORONARY STENT INTERVENTION;  Surgeon: Lyn Records, MD;  Location: [facility];  Service: Cardiovascular;  Laterality: N/A;   CORONARY STENT INTERVENTION N/A 03/28/2021   Procedure: CORONARY STENT INTERVENTION;  Surgeon: Tonny Bollman, MD;  Location: [facility] INVASIVE CV LAB;  Service: Cardiovascular;  Laterality: N/A;   CORONARY STENT INTERVENTION N/A 11/20/2023   Procedure: CORONARY STENT INTERVENTION;  Surgeon: Tonny Bollman, MD;  Location: [facility] INVASIVE CV LAB;  Service: Cardiovascular;  Laterality: N/A;   HEMORROIDECTOMY     KNEE ARTHROSCOPY     LEFT HEART CATH AND CORONARY ANGIOGRAPHY N/A 09/20/2020   Procedure: LEFT HEART CATH AND CORONARY ANGIOGRAPHY;  Surgeon: Lyn Records, MD;  Location: [facility];  Service: Cardiovascular;  Laterality: N/A;   LEFT HEART CATH AND CORONARY ANGIOGRAPHY N/A 03/28/2021   Procedure: LEFT HEART CATH AND CORONARY ANGIOGRAPHY;  Surgeon: Tonny Bollman, MD;  Location: [facility] INVASIVE CV LAB;  Service: Cardiovascular;  Laterality: N/A;   LEFT HEART CATH AND CORONARY ANGIOGRAPHY N/A 11/20/2023   Procedure: LEFT HEART CATH AND CORONARY ANGIOGRAPHY;  Surgeon: Tonny Bollman, MD;  Location: [facility] INVASIVE CV LAB;  Service: Cardiovascular;  Laterality: N/A;   LUMBAR DISC SURGERY     POLYPECTOMY  10/17/2021   Procedure: POLYPECTOMY;  Surgeon: Rachael Fee, MD;  Location: [facility];  Service: Endoscopy;;   SHOULDER ARTHROSCOPY WITH DISTAL CLAVICLE RESECTION Left 01/20/2023   Procedure: SHOULDER ARTHROSCOPY WITH DISTAL CLAVICLE EXCISION;  Surgeon: Teryl Lucy, MD;  Location: [facility];  Service: Orthopedics;   Laterality: Left;   SHOULDER ARTHROSCOPY WITH ROTATOR CUFF REPAIR AND SUBACROMIAL DECOMPRESSION Left 01/20/2023   Procedure: SHOULDER ARTHROSCOPY WITH ROTATOR CUFF REPAIR AND SUBACROMIAL DECOMPRESSION;  Surgeon: Teryl Lucy, MD;  Location: [facility];  Service: Orthopedics;  Laterality: Left;   TONSILLECTOMY AND ADENOIDECTOMY     Social History:  reports that he has quit smoking. His smoking use included e-cigarettes. He quit smokeless tobacco use about 3 years ago. He reports that he does not currently use drugs. He reports that he does not drink alcohol.  Allergies  Allergen Reactions   Bee Venom Anaphylaxis   Penicillins Anaphylaxis    Did it involve swelling of the face/tongue/throat, SOB, or low BP? yes Did it involve sudden or severe rash/hives, skin peeling, or any reaction on the inside of your mouth or nose? unknown Did you need to seek medical attention at a hospital or doctor's office? yes When did it last happen?      1967 If all above answers are "NO", may proceed with cephalosporin use.    Procaine Anaphylaxis   Isosorbide Other (See Comments)    Caused migraines   Tape Itching and Other (See Comments)    Certain medical tapes make the patient's skin [facility]  Family History  Problem Relation Age of Onset   Diabetes type II Other    Coronary artery disease Other    Bipolar disorder Other    Lung cancer Father    CAD Mother 53    Prior to Admission medications   Medication Sig Start Date End Date Taking? Authorizing Provider  albuterol (VENTOLIN HFA) 108 (90 Base) MCG/ACT inhaler Inhale 1-2 puffs into the lungs every 6 (six) hours as needed for wheezing or shortness of breath. 08/21/23  Yes Croitoru, Mihai, MD  atorvastatin (LIPITOR) 80 MG tablet TAKE 1 TABLET BY MOUTH EVERY DAY 06/16/23  Yes Croitoru, Mihai, MD  clopidogrel (PLAVIX) 75 MG tablet Take 1 tablet (75 mg total) by mouth daily with breakfast. 12/08/23  Yes Jodelle Gross, NP  glipiZIDE (GLUCOTROL) 5 MG  tablet Take 1 tablet (5 mg total) by mouth 2 (two) times daily before a meal. 10/27/23  Yes Carlus Pavlov, MD  metFORMIN (GLUCOPHAGE-XR) 500 MG 24 hr tablet Take 4 tablets (2,000 mg total) by mouth daily. 09/28/23  Yes Carlus Pavlov, MD  methocarbamol (ROBAXIN) 500 MG tablet Take 1 tablet (500 mg total) by mouth every 8 (eight) hours as needed for muscle spasms. 01/21/23  Yes Janine Ores K, PA-C  metoprolol tartrate (LOPRESSOR) 100 MG tablet Take 1 tablet (100 mg total) by mouth 2 (two) times daily. 08/21/23  Yes Croitoru, Mihai, MD  nitroGLYCERIN (NITROSTAT) 0.4 MG SL tablet Place 1 tablet (0.4 mg total) under the tongue every 5 (five) minutes as needed for chest pain. 12/08/23 03/25/25 Yes Jodelle Gross, NP  OVER THE COUNTER MEDICATION Take 1-2 packets by mouth daily. Goody's Extra Strength Headache Powder   Yes [provider]  pantoprazole (PROTONIX) 40 MG tablet Take 1 tablet (40 mg total) by mouth daily. 02/26/24  Yes Croitoru, Mihai, MD  ranolazine (RANEXA) 500 MG 12 hr tablet Take 2 tablets (1,000 mg total) by mouth 2 (two) times daily. 02/26/24  Yes Croitoru, Mihai, MD  aspirin EC 81 MG tablet Take 1 tablet (81 mg total) by mouth daily. Swallow whole. Patient not taking: Reported on 03/25/2024 08/21/23   Thurmon Fair, MD    Physical Exam: Vitals:   03/25/24 0420 03/25/24 0430 03/25/24 0500 03/25/24 0600  BP:  (!) 162/98 (!) 152/94 (!) 143/88  Pulse:  62 92 (!) 59  Resp:  18 20 19   Temp: 98.2 F (36.8 C)     TempSrc: Oral     SpO2:  99% 98% 97%  Weight:      Height:        Constitutional: Elderly male who appears older obese male who appears to be in no acute distress at this time Eyes: PERRL, lids and conjunctivae normal ENMT: Mucous membranes are moist. .Normal dentition.  Neck: normal, supple, no masses, no JVD. Respiratory: clear to auscultation bilaterally, no wheezing, no crackles. Normal respiratory effort. No accessory muscle use.  Cardiovascular:  Regular rate and rhythm, no murmurs / rubs / gallops.  Trace lower extremity edema  abdomen: no tenderness, no masses palpated. No hepatosplenomegaly. Bowel sounds positive.  Musculoskeletal: no clubbing / cyanosis. No joint deformity upper and lower extremities. Good ROM, no contractures. Normal muscle tone.  Skin: no rashes, lesions, ulcers. No induration Neurologic: CN 2-12 grossly intact. Sensation intact, DTR normal. Strength 5/5 in all 4.  Psychiatric: Normal judgment and insight. Alert and oriented x 3. Normal mood.   Data Reviewed:  EKG reveals a normal sinus rhythm at 69 bpm without significant  ischemic changes.  Reviewed labs, imaging, and pertinent records as documented.  Assessment and Plan: Unstable angina CAD Acute.  Patient presents with complaints of left chest pain similar to previous chest pain in 11/2023 which required PCI to OM2.  Initial high-sensitivity troponins negative x 2 and EKG without significant ischemic changes.  Cardiology consulted and plan on taken to cardiac cath today. - Admit to a cardiac telemetry bed - N.p.o. for likely need of cardiac cath later today - Check echocardiogram - Continue Plavix and resume aspirin 81 mg daily which patient was not taking - Continue Ranexa - Appreciate cardiology consultative services we will follow-up for any further recommendations.  Essential hypertension Blood pressures initially elevated up to 181/99. - Continue metoprolol - Hydralazine IV as needed  Controlled diabetes mellitus type 2, without long-term use of insulin On admission glucose noted to be 173.  Hemoglobin A1c noted to be 6.4. - Hypoglycemia protocols - Hold glipizide and metformin - CBGs before every meal with sensitive SSI - Adjust insulin regimen as deemed medically appropriate  Hyperlipidemia Lipid panel revealed LDL 47, HDL 29, and triglycerides 74. - Continue atorvastatin  History of tobacco abuse Patient reports quitting smoking a  couple years ago.  GERD - Continue Protonix  Obesity BMI 36.3 kg/m.  Patient is on Ozempic   DVT prophylaxis: Lovenox Advance Care Planning:   Code Status: Full Code   Consults: Cardiology  Family Communication: None requested  Severity of Illness: The appropriate patient status for this patient is OBSERVATION. Observation status is judged to be reasonable and necessary in order to provide the required intensity of service to ensure the patient's safety. The patient's presenting symptoms, physical exam findings, and initial radiographic and laboratory data in the context of their medical condition is felt to place them at decreased risk for further clinical deterioration. Furthermore, it is anticipated that the patient will be medically stable for discharge from the hospital within 2 midnights of admission.   Author: Clydie Braun, MD 03/25/2024 7:12 AM  For on call review [email or web address].

## 2024-03-25 NOTE — Hospital Course (Signed)
 63 year old man history of CAD status post recent heart cath in December 2024 currently on DAPT for 12 months, ADHD, chronic smoker, migraine, headache, atrial flutter status post ablation 08/2022, hyperlipidemia, essential hypertension, and DM type II presented emergency department with complaining of left-sided chest pain that started around 5:30 PM 03/24/2024.  Patient reported left-sided chest pain that radiating to his jaw.  At presentation to ED patient found hypertensive blood pressure 181/99 otherwise hemodynamically stable. Troponin 7 and 8 within normal range. CBC unremarkable. BMP unremarkable. EKG showing normal sinus rhythm heart rate 69. Chest x-ray no active disease process.  In the ED patient has been given topical nitroglycerin 1 inch / 15mg .    Cardiology Dr. Juel Burrow has been consulted recommended admission for further workup given patient has significant cardiac history.  Hospitalist has been consulted for further evaluation management of unstable angina.

## 2024-03-25 NOTE — H&P (View-Only) (Signed)
 DAILY PROGRESS NOTE   Patient Name: Jerry Shaffer Date of Encounter: 03/25/2024 Cardiologist: Thurmon Fair, MD  Chief Complaint   Persistent chest pain  Patient Profile   Jerry Shaffer is a 63 y.o. 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 Particia Nearing.   Subjective   Overnight consult note reviewed.  He reports still 2 out of 10 chest pain with a Nitropatch in place.  Echocardiogram is in progress.  He has had crescendo anginal symptoms recently which has been managed medically.  He is on maximal antianginal therapy but he awakened with more substantial chest pain than his baseline.  Symptoms are similar to his angina in the past notably in the left side of his chest radiating to the neck.  Objective   Vitals:   03/25/24 0600 03/25/24 0700 03/25/24 0815 03/25/24 0830  BP: (!) 143/88 (!) 178/94 (!) 144/93 (!) 173/95  Pulse: (!) 59 73 69 71  Resp: 19 20 10 18   Temp:      TempSrc:      SpO2: 97% 96% 96% 97%  Weight:      Height:       No intake or output data in the 24 hours ending 03/25/24 0922 Filed Weights   03/24/24 2022  Weight: 108.3 kg    Physical Exam   General appearance: alert and no distress Lungs: clear to auscultation bilaterally Heart: regular rate and rhythm, S1, S2 normal, no murmur, click, rub or gallop Extremities: extremities normal, atraumatic, no cyanosis or edema Neurologic: Grossly normal  Inpatient Medications    Scheduled Meds:  aspirin EC  81 mg Oral Daily   atorvastatin  80 mg Oral QPM   clopidogrel  75 mg Oral Q breakfast   enoxaparin (LOVENOX) injection  40 mg Subcutaneous Q24H   insulin aspart  0-5 Units Subcutaneous QHS   insulin aspart  0-9 Units Subcutaneous TID WC   metoprolol tartrate  100 mg Oral BID   pantoprazole  40  mg Oral Daily   ranolazine  1,000 mg Oral BID   sodium chloride flush  3 mL Intravenous Q12H    Continuous Infusions:   PRN Meds: acetaminophen **OR** acetaminophen, albuterol, butalbital-acetaminophen-caffeine, ondansetron **OR** ondansetron (ZOFRAN) IV, perflutren lipid microspheres (DEFINITY) IV suspension   Labs   Results for orders placed or performed during the hospital encounter of 03/24/24 (from the past 48 hours)  Basic metabolic panel     Status: Abnormal   Collection Time: 03/24/24  8:09 PM  Result Value Ref Range   Sodium 139 135 - 145 mmol/L   Potassium 3.9 3.5 - 5.1 mmol/L   Chloride 105 98 - 111 mmol/L   CO2 24 22 - 32 mmol/L   Glucose, Bld 173 (H) 70 - 99 mg/dL    Comment: Glucose reference range applies only to samples taken after fasting for at least 8 hours.   BUN 11 8 - 23 mg/dL   Creatinine, Ser 1.91 0.61 - 1.24 mg/dL   Calcium 8.8 (L) 8.9 - 10.3 mg/dL   GFR, Estimated >47 >82 mL/min    Comment: (NOTE) Calculated using the CKD-EPI Creatinine Equation (2021)    Anion gap 10 5 - 15    Comment: Performed at [facility],  [address] [place], [place] [address]  CBC     Status: None   Collection Time: 03/24/24  8:09 PM  Result Value Ref Range   WBC 8.0 4.0 - 10.5 K/uL   RBC 4.55 4.22 - 5.81 MIL/uL   Hemoglobin 13.1 13.0 - 17.0 g/dL   HCT 60.4 54.0 - 98.1 %   MCV 88.4 80.0 - 100.0 fL   MCH 28.8 26.0 - 34.0 pg   MCHC 32.6 30.0 - 36.0 g/dL   RDW 19.1 47.8 - 29.5 %   Platelets 227 150 - 400 K/uL   nRBC 0.0 0.0 - 0.2 %    Comment: Performed at [facility], [address] [place], [place] [address]  Troponin I (High Sensitivity)     Status: None   Collection Time: 03/24/24  8:09 PM  Result Value Ref Range   Troponin I (High Sensitivity) 8 <18 ng/L    Comment: (NOTE) Elevated high sensitivity troponin I (hsTnI) values and significant  changes across serial measurements may suggest ACS but many other  chronic and acute conditions are  known to elevate hsTnI results.  Refer to the "Links" section for chest pain algorithms and additional  guidance. Performed at [facility], [address] [place], [place] [address]   Hemoglobin A1c     Status: Abnormal   Collection Time: 03/24/24  8:09 PM  Result Value Ref Range   Hgb A1c MFr Bld 6.4 (H) 4.8 - 5.6 %    Comment: (NOTE) Pre diabetes:          5.7%-6.4%  Diabetes:              >6.4%  Glycemic control for   <7.0% adults with diabetes    Mean Plasma Glucose 136.98 mg/dL    Comment: Performed at [facility], [address], [place], [place] [address]  Troponin I (High Sensitivity)     Status: None   Collection Time: 03/24/24 10:10 PM  Result Value Ref Range   Troponin I (High Sensitivity) 7 <18 ng/L    Comment: (NOTE) Elevated high sensitivity troponin I (hsTnI) values and significant  changes across serial measurements may suggest ACS but many other  chronic and acute conditions are known to elevate hsTnI results.  Refer to the "Links" section for chest pain algorithms and additional  guidance. Performed at [facility], [address], [place], [place] [address]   Hepatic function panel     Status: Abnormal   Collection Time: 03/24/24 10:10 PM  Result Value Ref Range   Total Protein 5.6 (L) 6.5 - 8.1 g/dL   Albumin 3.4 (L) 3.5 - 5.0 g/dL   AST 17 15 - 41 U/L   ALT 20 0 - 44 U/L   Alkaline Phosphatase 73 38 - 126 U/L   Total Bilirubin 0.7 0.0 - 1.2 mg/dL   Bilirubin, Direct <1.3 0.0 - 0.2 mg/dL    Comment: REPEATED TO VERIFY   Indirect Bilirubin NOT CALCULATED 0.3 - 0.9 mg/dL    Comment: Performed at [facility], [address] [place], [place] [address]  Lipid panel     Status: Abnormal   Collection Time: 03/24/24 10:10 PM  Result Value Ref Range   Cholesterol 91 0 - 200 mg/dL   Triglycerides 74 <027 mg/dL   HDL 29 (L) >25 mg/dL   Total CHOL/HDL Ratio 3.1 RATIO   VLDL 15 0 - 40 mg/dL   LDL Cholesterol  47 0 - 99 mg/dL     Comment:        Total Cholesterol/HDL:CHD Risk Coronary Heart Disease Risk Table                     Men   Women  1/2 Average Risk   3.4   3.3  Average Risk       5.0   4.4  2 X Average Risk   9.6   7.1  3 X Average Risk  23.4   11.0        Use the calculated Patient Ratio above and the CHD Risk Table to determine the patient's CHD Risk.        ATP III CLASSIFICATION (LDL):  <100     mg/dL   Optimal  960-454  mg/dL   Near or Above                    Optimal  130-159  mg/dL   Borderline  098-119  mg/dL   High  >147     mg/dL   Very High Performed at [facility], [address] [place], [place] [address]   CBG monitoring, ED     Status: Abnormal   Collection Time: 03/25/24  8:13 AM  Result Value Ref Range   Glucose-Capillary 143 (H) 70 - 99 mg/dL    Comment: Glucose reference range applies only to samples taken after fasting for at least 8 hours.    ECG   Normal sinus rhythm at 69 (03/24/2024)- Personally Reviewed  Telemetry   Sinus rhythm- Personally Reviewed  Radiology    ECHOCARDIOGRAM COMPLETE Result Date: 03/25/2024    ECHOCARDIOGRAM REPORT   Patient Name:   Jerry Shaffer Date of Exam: 03/25/2024 Medical Rec #:  [id number]       Height:       68.0 in Accession #:    [id number]      Weight:       238.8 lb Date of Birth:  07/25/1961      BSA:          2.204 m Patient Age:    62 years        BP:           173/95 mmHg Patient Gender: M               HR:           73 bpm. Exam Location:  Inpatient Procedure: 2D Echo, Cardiac Doppler, Color Doppler and Intracardiac            Opacification Agent (Both Spectral and Color Flow Doppler were            utilized during procedure). Indications:    Chest Pain  History:        Patient has prior history of Echocardiogram examinations.                 NSTEMI, CAD, Stroke and TIA, Signs/Symptoms:Chest Pain; Risk                 Factors:Former Smoker, Diabetes and HLD.  Sonographer:    Lamont Snowball Referring Phys: [id number] RONDELL A  SMITH IMPRESSIONS  1. Left ventricular ejection fraction, by estimation, is 55 to 60%. The left ventricle has normal function. The left ventricle has no regional wall motion abnormalities. There is mild concentric left ventricular hypertrophy. Left ventricular diastolic parameters were normal.  2. Right ventricular systolic  function is normal. The right ventricular size is normal. Tricuspid regurgitation signal is inadequate for assessing PA pressure.  3. The mitral valve is normal in structure. No evidence of mitral valve regurgitation. No evidence of mitral stenosis.  4. The aortic valve is normal in structure. Aortic valve regurgitation is not visualized. No aortic stenosis is present.  5. The inferior vena cava is normal in size with greater than 50% respiratory variability, suggesting right atrial pressure of 3 mmHg. FINDINGS  Left Ventricle: Left ventricular ejection fraction, by estimation, is 55 to 60%. The left ventricle has normal function. The left ventricle has no regional wall motion abnormalities. Definity contrast agent was given IV to delineate the left ventricular  endocardial borders. The left ventricular internal cavity size was normal in size. There is mild concentric left ventricular hypertrophy. Left ventricular diastolic parameters were normal. Right Ventricle: The right ventricular size is normal. No increase in right ventricular wall thickness. Right ventricular systolic function is normal. Tricuspid regurgitation signal is inadequate for assessing PA pressure. Left Atrium: Left atrial size was normal in size. Right Atrium: Right atrial size was normal in size. Prominent Chiari network. Pericardium: There is no evidence of pericardial effusion. Mitral Valve: The mitral valve is normal in structure. No evidence of mitral valve regurgitation. No evidence of mitral valve stenosis. MV peak gradient, 3.6 mmHg. The mean mitral valve gradient is 2.0 mmHg. Tricuspid Valve: The tricuspid valve is  normal in structure. Tricuspid valve regurgitation is trivial. No evidence of tricuspid stenosis. Aortic Valve: The aortic valve is normal in structure. Aortic valve regurgitation is not visualized. No aortic stenosis is present. Aortic valve peak gradient measures 6.4 mmHg. Pulmonic Valve: The pulmonic valve was normal in structure. Pulmonic valve regurgitation is not visualized. No evidence of pulmonic stenosis. Aorta: The aortic root is normal in size and structure. Venous: The inferior vena cava is normal in size with greater than 50% respiratory variability, suggesting right atrial pressure of 3 mmHg. IAS/Shunts: No atrial level shunt detected by color flow Doppler.  LEFT VENTRICLE PLAX 2D LVIDd:         4.30 cm   Diastology LVIDs:         3.00 cm   LV e' medial:    5.22 cm/s LV PW:         1.20 cm   LV E/e' medial:  14.3 LV IVS:        1.40 cm   LV e' lateral:   8.59 cm/s LVOT diam:     2.00 cm   LV E/e' lateral: 8.7 LV SV:         79 LV SV Index:   36 LVOT Area:     3.14 cm  RIGHT VENTRICLE             IVC RV Basal diam:  3.60 cm     IVC diam: 2.20 cm RV S prime:     13.80 cm/s TAPSE (M-mode): 2.0 cm LEFT ATRIUM             Index        RIGHT ATRIUM           Index LA Vol (A2C):   69.3 ml 31.45 ml/m  RA Area:     17.40 cm LA Vol (A4C):   57.3 ml 26.00 ml/m  RA Volume:   45.80 ml  20.78 ml/m LA Biplane Vol: 64.6 ml 29.32 ml/m  AORTIC VALVE AV Area (Vmax): 2.74 cm AV Vmax:  126.00 cm/s AV Peak Grad:   6.4 mmHg LVOT Vmax:      110.00 cm/s LVOT Vmean:     72.300 cm/s LVOT VTI:       0.251 m  AORTA Ao Root diam: 3.60 cm Ao Asc diam:  3.80 cm MITRAL VALVE MV Area (PHT): 2.56 cm    SHUNTS MV Area VTI:   3.35 cm    Systemic VTI:  0.25 m MV Peak grad:  3.6 mmHg    Systemic Diam: 2.00 cm MV Mean grad:  2.0 mmHg MV Vmax:       0.95 m/s MV Vmean:      68.2 cm/s MV Decel Time: 296 msec MV E velocity: 74.90 cm/s MV A velocity: 81.80 cm/s MV E/A ratio:  0.92 Clearnce Hasten Electronically signed by Clearnce Hasten Signature Date/Time: 03/25/2024/9:14:02 AM    Final    DG Chest 1 View Result Date: 03/24/2024 CLINICAL DATA:  Chest pain EXAM: CHEST  1 VIEW COMPARISON:  11/20/2023 FINDINGS: The heart size and mediastinal contours are within normal limits. Both lungs are clear. The visualized skeletal structures are unremarkable. IMPRESSION: No active disease. Electronically Signed   By: Jasmine Pang M.D.   On: 03/24/2024 23:11    Cardiac Studies   See echo above  Assessment   Principal Problem:   Unstable angina [facility]) Active Problems:   Coronary artery disease involving native coronary artery with angina pectoris [facility])   Plan   Mr. Kall presented with acute on chronic anginal symptoms, more severe than it has been recently despite being on maximal antianginal therapy.  He was seen back in March and noted to be having anginal symptoms.  His last catheterization was in December, at which time he underwent PCI to the left circumflex with a drug-eluting stent.  He was also noted to have about a 50% mid LAD stenosis at the time.  Given his crescendo symptoms and being on maximal antianginal therapy, I believe repeat cardiac catheterization is warranted, despite negative troponins.  He is in agreement.  Echo today showed normal LVEF and normal diastolic function without significant valvular disease.  He has been n.p.o. since yesterday evening.  Plan to proceed with definitive cardiac catheterization today.  Informed Consent   Shared Decision Making/Informed Consent The risks [stroke (1 in 1000), death (1 in 1000), kidney failure [usually temporary] (1 in 500), bleeding (1 in 200), allergic reaction [possibly serious] (1 in 200)], benefits (diagnostic support and management of coronary artery disease) and alternatives of a cardiac catheterization were discussed in detail with Mr. Lia and he is willing to proceed.    Time Spent Directly with Patient:  I have spent a total of 25 minutes with the  patient reviewing hospital notes, telemetry, EKGs, labs and examining the patient as well as establishing an assessment and plan that was discussed personally with the patient.  > 50% of time was spent in direct patient care.  Length of Stay:  LOS: 0 days   Chrystie Nose, MD, [facility], FACP  [facility]  Medical Director of the Advanced Lipid Disorders &  Cardiovascular Risk Reduction Clinic Diplomate of the American Board of Clinical Lipidology Attending Cardiologist  Direct Dial: [phone]  Fax: [phone]  Website:  [email or web address]  Lisette Abu Yuliet Needs 03/25/2024, 9:22 AM

## 2024-03-25 NOTE — Discharge Summary (Signed)
 Physician Discharge Summary   Patient: Jerry Shaffer MRN: [id number] DOB: July 11, 1961  Admit date:     03/24/2024  Discharge date: 03/25/24  Discharge Physician: Clydie Braun   PCP: Patient, No Pcp Per   Recommendations at discharge:   Recommend outpatient follow-up with primary care provider  Discharge Diagnoses: Principal Problem:   Unstable angina [facility]) Active Problems:   Coronary artery disease involving native coronary artery with angina pectoris (HCC)   HTN (hypertension)   Controlled type 2 diabetes mellitus without complication, without long-term current use of insulin (HCC)   Hyperlipidemia   History of tobacco abuse   GERD   Obesity (BMI 30-39.9)  Resolved Problems:   * No resolved hospital problems. *  Hospital Course: 63 year old man history of CAD status post recent heart cath in December 2024 currently on DAPT for 12 months, ADHD, chronic smoker, migraine, headache, atrial flutter status post ablation 08/2022, hyperlipidemia, essential hypertension, and DM type II presented emergency department with complaining of left-sided chest pain that started around 5:30 PM 03/24/2024.  Patient reported left-sided chest pain.  At presentation to ED patient found hypertensive blood pressure 181/99 otherwise hemodynamically stable. Troponin 7 and 8 within normal range. CBC unremarkable. BMP unremarkable. EKG showing normal sinus rhythm heart rate 69. Chest x-ray no active disease process.  In the ED patient has been given topical nitroglycerin 1 inch / 15mg .    Cardiology Dr. Juel Burrow has been consulted recommended admission for further workup given patient has significant cardiac history.  Hospitalist has been consulted for further evaluation management of unstable angina.  Assessment and Plan: Unstable angina CAD Acute.  Patient presents with complaints of left chest pain similar to previous chest pain in 11/2023 which required PCI to OM2.  Initial high-sensitivity  troponins negative x 2 and EKG without significant ischemic changes.  Cardiology consulted and took patient for cardiac catheterization.  Echocardiogram and also been performed which noted preserved EF with above 60%.  Cardiac catheterization revealed stable three-vessel disease with widely patent stents in the LAD, OM1, and PDA with mild to moderate disease elsewhere most notable nonstenotic lesion is 55% in the mid LAD which is stable just after the second diagonal branch.  Patient was recommended to continue antiplatelet regimen and Ranexa.  Essential hypertension Blood pressures initially elevated up to 181/99.  Patient was continued on metoprolol and Ranexa.  Controlled diabetes mellitus type 2, without long-term use of insulin On admission glucose noted to be 173.  Hemoglobin A1c noted to be 6.4.  In the hospital metformin and glipizide were held.   Hyperlipidemia Lipid panel revealed LDL 47, HDL 29, and triglycerides 74.  Patient recommended to continue on atorvastatin.   History of tobacco abuse Patient reports quitting smoking a couple years ago.   GERD Patient was continued on PPI   Obesity BMI 36.3 kg/m.  Patient is on Ozempic        Consultants: Cardiology Procedures performed: Left heart cath Disposition: Home Diet recommendation:  Discharge Diet Orders (From admission, onward)     Start     Ordered   03/25/24 0000  Diet - low sodium heart healthy        03/25/24 1754   03/25/24 0000  Diet Carb Modified        03/25/24 1754           Cardiac and Carb modified diet DISCHARGE MEDICATION: Allergies as of 03/25/2024       Reactions   Bee Venom Anaphylaxis  Penicillins Anaphylaxis   Did it involve swelling of the face/tongue/throat, SOB, or low BP? yes Did it involve sudden or severe rash/hives, skin peeling, or any reaction on the inside of your mouth or nose? unknown Did you need to seek medical attention at a hospital or doctor's office? yes When did it  last happen?      1967 If all above answers are "NO", may proceed with cephalosporin use.   Procaine Anaphylaxis   Isosorbide Other (See Comments)   Caused migraines   Tape Itching, Other (See Comments)   Certain medical tapes make the patient's skin ITCH        Medication List     TAKE these medications    albuterol 108 (90 Base) MCG/ACT inhaler Commonly known as: VENTOLIN HFA Inhale 1-2 puffs into the lungs every 6 (six) hours as needed for wheezing or shortness of breath.   aspirin EC 81 MG tablet Take 1 tablet (81 mg total) by mouth daily. Swallow whole.   atorvastatin 80 MG tablet Commonly known as: LIPITOR TAKE 1 TABLET BY MOUTH EVERY DAY   clopidogrel 75 MG tablet Commonly known as: PLAVIX Take 1 tablet (75 mg total) by mouth daily with breakfast.   glipiZIDE 5 MG tablet Commonly known as: GLUCOTROL Take 1 tablet (5 mg total) by mouth 2 (two) times daily before a meal.   metFORMIN 500 MG 24 hr tablet Commonly known as: GLUCOPHAGE-XR Take 4 tablets (2,000 mg total) by mouth daily.   methocarbamol 500 MG tablet Commonly known as: ROBAXIN Take 1 tablet (500 mg total) by mouth every 8 (eight) hours as needed for muscle spasms.   metoprolol tartrate 100 MG tablet Commonly known as: LOPRESSOR Take 1 tablet (100 mg total) by mouth 2 (two) times daily.   nitroGLYCERIN 0.4 MG SL tablet Commonly known as: NITROSTAT Place 1 tablet (0.4 mg total) under the tongue every 5 (five) minutes as needed for chest pain.   OVER THE COUNTER MEDICATION Take 1-2 packets by mouth daily. Goody's Extra Strength Headache Powder   pantoprazole 40 MG tablet Commonly known as: PROTONIX Take 1 tablet (40 mg total) by mouth daily.   ranolazine 500 MG 12 hr tablet Commonly known as: Ranexa Take 2 tablets (1,000 mg total) by mouth 2 (two) times daily.        Discharge Exam: Filed Weights   03/24/24 2022 03/25/24 1800  Weight: 108.3 kg 110.1 kg   Constitutional: 63 year old male  who appears older obese male who appears to be in no acute distress at this time Eyes: PERRL, lids and conjunctivae normal ENMT: Mucous membranes are moist. Normal dentition.  Neck: normal, supple, no masses, no JVD. Respiratory: clear to auscultation bilaterally, no wheezing, no crackles. Normal respiratory effort. Cardiovascular: Regular rate and rhythm, no murmurs / rubs / gallops.  Trace lower extremity edema  abdomen: no tenderness, no masses palpated. Bowel sounds positive.  Musculoskeletal: no clubbing / cyanosis. No joint deformity upper and lower extremities. Good ROM, no contractures. Normal muscle tone.  Skin: no rashes, lesions, ulcers. No induration Neurologic: CN 2-12 grossly intact.  Strength 5/5 in all 4.  Psychiatric: Normal judgment and insight. Alert and oriented x 3. Normal mood.   Condition at discharge: stable  The results of significant diagnostics from this hospitalization (including imaging, microbiology, ancillary and laboratory) are listed below for reference.   Imaging Studies: CARDIAC CATHETERIZATION Result Date: 03/25/2024 Images from the original result were not included.   Previously placed Prox LAD to  Mid LAD stent of unknown type is  widely patent.  Mid LAD lesion is 55% stenosed.   2nd Diag lesion is 35% stenosed.   Recently placed 2nd Mrg stent of unknown type is widely patent.   Prox RCA to Mid RCA lesion is 30% stenosed.  RPAV lesion is 50% stenosed.   RPDA-1 lesion is 40% stenosed.  Previously placed RPDA-2 stent of unknown type is widely patent.   LV end diastolic pressure is normal.   There is no aortic valve stenosis. Dominance: Right Stable three-vessel disease with widely patent stents in the LAD, OM1 and PDA with mild to moderate disease elsewhere.  Most notable nonstented lesion is a 55 % mid LAD lesion which is stable just after the 2nd Diagl branch. Normal LVEDP with normal EF by Echo RECOMMENDATIONS   In the absence of any other complications or  medical issues, we expect the patient to be ready for discharge from a cath perspective on 03/25/2024.   Continue antiplatelet regimen per prior PCI Bryan Lemma, MD  ECHOCARDIOGRAM COMPLETE Result Date: 03/25/2024    ECHOCARDIOGRAM REPORT   Patient Name:   [name] Date of Exam: 03/25/2024 Medical Rec #:  [id number]       Height:       68.0 in Accession #:    [id number]      Weight:       238.8 lb Date of Birth:  04/29/1961      BSA:          2.204 m Patient Age:    62 years        BP:           173/95 mmHg Patient Gender: M               HR:           73 bpm. Exam Location:  Inpatient Procedure: 2D Echo, Cardiac Doppler, Color Doppler and Intracardiac            Opacification Agent (Both Spectral and Color Flow Doppler were            utilized during procedure). Indications:    Chest Pain  History:        Patient has prior history of Echocardiogram examinations.                 NSTEMI, CAD, Stroke and TIA, Signs/Symptoms:Chest Pain; Risk                 Factors:Former Smoker, Diabetes and HLD.  Sonographer:    Lamont Snowball Referring Phys: [id number] Koral Thaden A Ingrid Shifrin IMPRESSIONS  1. Left ventricular ejection fraction, by estimation, is 55 to 60%. The left ventricle has normal function. The left ventricle has no regional wall motion abnormalities. There is mild concentric left ventricular hypertrophy. Left ventricular diastolic parameters were normal.  2. Right ventricular systolic function is normal. The right ventricular size is normal. Tricuspid regurgitation signal is inadequate for assessing PA pressure.  3. The mitral valve is normal in structure. No evidence of mitral valve regurgitation. No evidence of mitral stenosis.  4. The aortic valve is normal in structure. Aortic valve regurgitation is not visualized. No aortic stenosis is present.  5. The inferior vena cava is normal in size with greater than 50% respiratory variability, suggesting right atrial pressure of 3 mmHg. FINDINGS  Left Ventricle: Left  ventricular ejection fraction, by estimation, is 55 to 60%. The left ventricle has normal function. The left ventricle has  no regional wall motion abnormalities. Definity contrast agent was given IV to delineate the left ventricular  endocardial borders. The left ventricular internal cavity size was normal in size. There is mild concentric left ventricular hypertrophy. Left ventricular diastolic parameters were normal. Right Ventricle: The right ventricular size is normal. No increase in right ventricular wall thickness. Right ventricular systolic function is normal. Tricuspid regurgitation signal is inadequate for assessing PA pressure. Left Atrium: Left atrial size was normal in size. Right Atrium: Right atrial size was normal in size. Prominent Chiari network. Pericardium: There is no evidence of pericardial effusion. Mitral Valve: The mitral valve is normal in structure. No evidence of mitral valve regurgitation. No evidence of mitral valve stenosis. MV peak gradient, 3.6 mmHg. The mean mitral valve gradient is 2.0 mmHg. Tricuspid Valve: The tricuspid valve is normal in structure. Tricuspid valve regurgitation is trivial. No evidence of tricuspid stenosis. Aortic Valve: The aortic valve is normal in structure. Aortic valve regurgitation is not visualized. No aortic stenosis is present. Aortic valve peak gradient measures 6.4 mmHg. Pulmonic Valve: The pulmonic valve was normal in structure. Pulmonic valve regurgitation is not visualized. No evidence of pulmonic stenosis. Aorta: The aortic root is normal in size and structure. Venous: The inferior vena cava is normal in size with greater than 50% respiratory variability, suggesting right atrial pressure of 3 mmHg. IAS/Shunts: No atrial level shunt detected by color flow Doppler.  LEFT VENTRICLE PLAX 2D LVIDd:         4.30 cm   Diastology LVIDs:         3.00 cm   LV e' medial:    5.22 cm/s LV PW:         1.20 cm   LV E/e' medial:  14.3 LV IVS:        1.40 cm   LV  e' lateral:   8.59 cm/s LVOT diam:     2.00 cm   LV E/e' lateral: 8.7 LV SV:         79 LV SV Index:   36 LVOT Area:     3.14 cm  RIGHT VENTRICLE             IVC RV Basal diam:  3.60 cm     IVC diam: 2.20 cm RV S prime:     13.80 cm/s TAPSE (M-mode): 2.0 cm LEFT ATRIUM             Index        RIGHT ATRIUM           Index LA Vol (A2C):   69.3 ml 31.45 ml/m  RA Area:     17.40 cm LA Vol (A4C):   57.3 ml 26.00 ml/m  RA Volume:   45.80 ml  20.78 ml/m LA Biplane Vol: 64.6 ml 29.32 ml/m  AORTIC VALVE AV Area (Vmax): 2.74 cm AV Vmax:        126.00 cm/s AV Peak Grad:   6.4 mmHg LVOT Vmax:      110.00 cm/s LVOT Vmean:     72.300 cm/s LVOT VTI:       0.251 m  AORTA Ao Root diam: 3.60 cm Ao Asc diam:  3.80 cm MITRAL VALVE MV Area (PHT): 2.56 cm    SHUNTS MV Area VTI:   3.35 cm    Systemic VTI:  0.25 m MV Peak grad:  3.6 mmHg    Systemic Diam: 2.00 cm MV Mean grad:  2.0 mmHg MV Vmax:  0.95 m/s MV Vmean:      68.2 cm/s MV Decel Time: 296 msec MV E velocity: 74.90 cm/s MV A velocity: 81.80 cm/s MV E/A ratio:  0.92 Clearnce Hasten Electronically signed by Clearnce Hasten Signature Date/Time: 03/25/2024/9:14:02 AM    Final    DG Chest 1 View Result Date: 03/24/2024 CLINICAL DATA:  Chest pain EXAM: CHEST  1 VIEW COMPARISON:  11/20/2023 FINDINGS: The heart size and mediastinal contours are within normal limits. Both lungs are clear. The visualized skeletal structures are unremarkable. IMPRESSION: No active disease. Electronically Signed   By: Jasmine Pang M.D.   On: 03/24/2024 23:11    Microbiology: Results for orders placed or performed during the hospital encounter of 09/14/22  SARS Coronavirus 2 by RT PCR (hospital order, performed in [facility] hospital lab) *cepheid single result test* Anterior Nasal Swab     Status: Abnormal   Collection Time: 09/14/22  6:16 PM   Specimen: Anterior Nasal Swab  Result Value Ref Range Status   SARS Coronavirus 2 by RT PCR POSITIVE (A) NEGATIVE Final    Comment:  (NOTE) SARS-CoV-2 target nucleic acids are DETECTED  SARS-CoV-2 RNA is generally detectable in upper respiratory specimens  during the acute phase of infection.  Positive results are indicative  of the presence of the identified virus, but do not rule out bacterial infection or co-infection with other pathogens not detected by the test.  Clinical correlation with patient history and  other diagnostic information is necessary to determine patient infection status.  The expected result is negative.  Fact Sheet for Patients:   RoadLapTop.co.za   Fact Sheet for Healthcare Providers:   http://kim-miller.com/    This test is not yet approved or cleared by the [place] FDA and  has been authorized for detection and/or diagnosis of SARS-CoV-2 by FDA under an Emergency Use Authorization (EUA).  This EUA will remain in effect (meaning this test can be used) for the duration of  the COVID-19 declaration under Section 564(b)(1)  of the Act, 21 U.S.C. section 360-bbb-3(b)(1), unless the authorization is terminated or revoked sooner.   Performed at [facility], [address] [place], [place] [address]     Labs: CBC: Recent Labs  Lab 03/24/24 2009  WBC 8.0  HGB 13.1  HCT 40.2  MCV 88.4  PLT 227   Basic Metabolic Panel: Recent Labs  Lab 03/24/24 2009  NA 139  K 3.9  CL 105  CO2 24  GLUCOSE 173*  BUN 11  CREATININE 0.81  CALCIUM 8.8*   Liver Function Tests: Recent Labs  Lab 03/24/24 2210  AST 17  ALT 20  ALKPHOS 73  BILITOT 0.7  PROT 5.6*  ALBUMIN 3.4*   CBG: Recent Labs  Lab 03/25/24 0813 03/25/24 1138 03/25/24 1622  GLUCAP 143* 151* 110*    Discharge time spent: less than 30 minutes.  Signed: Clydie Braun, MD [facility] 03/25/2024

## 2024-03-25 NOTE — ED Provider Notes (Signed)
 [place] EMERGENCY DEPARTMENT AT [facility] Provider Note   CSN: [id number] Arrival date & time: 03/24/24  1951     History  Chief Complaint  Patient presents with   Chest Pain    Jerry Shaffer is a 63 y.o. male who presents with concern for sharp left-sided chest pains around 5:30 PM. Patient with history of CAD, NSTEMI in the past, stent placed in the last month on aspirin and Plavix.  Patient states he had similar episode last night that radiated to his jaw but resolved.  No aggravating or alleviating factors. No radiation of pain, occurred at rest tonight.  Took 3 doses of home nitro at home without improvement, administered aspirin en route with EMS.  Mildly hypertensive.  History of type 2 diabetes, ADHD, CVA, CAD, GERD.  No anticoagulation but positive antiplatelet with aspirin and Plavix.   Patient with history of patient with history of PCI, A-fib with successful ablation in September 2023 without persistent dysrhythmia since that time.  Patient with history of cardiac catheterization in December 2024 with drug-eluting stent placed. Currently on ozempic, hx of GERD.  HPI     Home Medications Prior to Admission medications   Medication Sig Start Date End Date Taking? Authorizing Provider  albuterol (VENTOLIN HFA) 108 (90 Base) MCG/ACT inhaler Inhale 1-2 puffs into the lungs every 6 (six) hours as needed for wheezing or shortness of breath. 08/21/23  Yes Croitoru, Mihai, MD  atorvastatin (LIPITOR) 80 MG tablet TAKE 1 TABLET BY MOUTH EVERY DAY 06/16/23  Yes Croitoru, Mihai, MD  clopidogrel (PLAVIX) 75 MG tablet Take 1 tablet (75 mg total) by mouth daily with breakfast. 12/08/23  Yes Jodelle Gross, NP  glipiZIDE (GLUCOTROL) 5 MG tablet Take 1 tablet (5 mg total) by mouth 2 (two) times daily before a meal. 10/27/23  Yes Carlus Pavlov, MD  metFORMIN (GLUCOPHAGE-XR) 500 MG 24 hr tablet Take 4 tablets (2,000 mg total) by mouth daily. 09/28/23  Yes Carlus Pavlov, MD  methocarbamol (ROBAXIN) 500 MG tablet Take 1 tablet (500 mg total) by mouth every 8 (eight) hours as needed for muscle spasms. 01/21/23  Yes Janine Ores K, PA-C  metoprolol tartrate (LOPRESSOR) 100 MG tablet Take 1 tablet (100 mg total) by mouth 2 (two) times daily. 08/21/23  Yes Croitoru, Mihai, MD  nitroGLYCERIN (NITROSTAT) 0.4 MG SL tablet Place 1 tablet (0.4 mg total) under the tongue every 5 (five) minutes as needed for chest pain. 12/08/23 03/25/25 Yes Jodelle Gross, NP  OVER THE COUNTER MEDICATION Take 1-2 packets by mouth daily. Goody's Extra Strength Headache Powder   Yes [provider]  pantoprazole (PROTONIX) 40 MG tablet Take 1 tablet (40 mg total) by mouth daily. 02/26/24  Yes Croitoru, Mihai, MD  ranolazine (RANEXA) 500 MG 12 hr tablet Take 2 tablets (1,000 mg total) by mouth 2 (two) times daily. 02/26/24  Yes Croitoru, Mihai, MD  aspirin EC 81 MG tablet Take 1 tablet (81 mg total) by mouth daily. Swallow whole. Patient not taking: Reported on 03/25/2024 08/21/23   Croitoru, Rachelle Hora, MD      Allergies    Bee venom, Penicillins, Procaine, Isosorbide, and Tape    Review of Systems   Review of Systems  Cardiovascular:  Positive for chest pain.    Physical Exam Updated Vital Signs BP (!) 152/94   Pulse 92   Temp 98.2 F (36.8 C) (Oral)   Resp 20   Ht 5\' 8"  (1.727 m)   Wt 108.3  kg   SpO2 98%   BMI 36.30 kg/m  Physical Exam  ED Results / Procedures / Treatments   Labs (all labs ordered are listed, but only abnormal results are displayed) Labs Reviewed  BASIC METABOLIC PANEL WITH GFR - Abnormal; Notable for the following components:      Result Value   Glucose, Bld 173 (*)    Calcium 8.8 (*)    All other components within normal limits  HEPATIC FUNCTION PANEL - Abnormal; Notable for the following components:   Total Protein 5.6 (*)    Albumin 3.4 (*)    All other components within normal limits  HEMOGLOBIN A1C - Abnormal; Notable for the  following components:   Hgb A1c MFr Bld 6.4 (*)    All other components within normal limits  LIPID PANEL - Abnormal; Notable for the following components:   HDL 29 (*)    All other components within normal limits  CBC  TROPONIN I (HIGH SENSITIVITY)  TROPONIN I (HIGH SENSITIVITY)    EKG EKG Interpretation Date/Time:  Thursday March 24 2024 20:03:11 EDT Ventricular Rate:  69 PR Interval:  156 QRS Duration:  84 QT Interval:  414 QTC Calculation: 443 R Axis:   42  Text Interpretation: Normal sinus rhythm Normal ECG When compared with ECG of 08-Dec-2023 08:19, PREVIOUS ECG IS PRESENT Confirmed by Beckey Downing [phone]) on 03/24/2024 8:10:17 PM  Radiology DG Chest 1 View Result Date: 03/24/2024 CLINICAL DATA:  Chest pain EXAM: CHEST  1 VIEW COMPARISON:  11/20/2023 FINDINGS: The heart size and mediastinal contours are within normal limits. Both lungs are clear. The visualized skeletal structures are unremarkable. IMPRESSION: No active disease. Electronically Signed   By: Jasmine Pang M.D.   On: 03/24/2024 23:11    Procedures Procedures    Medications Ordered in ED Medications  acetaminophen-codeine (TYLENOL #3) 300-30 MG per tablet 1 tablet (1 tablet Oral Given 03/24/24 2114)  ondansetron (ZOFRAN-ODT) disintegrating tablet 4 mg (4 mg Oral Given 03/24/24 2114)  nitroGLYCERIN (NITROGLYN) 2 % ointment 0.5 inch (0.5 inches Topical Given 03/25/24 0049)  ondansetron (ZOFRAN) injection 4 mg (4 mg Intravenous Given 03/25/24 0049)    ED Course/ Medical Decision Making/ A&P Clinical Course as of 03/25/24 0552  Fri Mar 25, 2024  0047 Patient's pain at rest, recurring now, sharp left sided, nonradiating. Given cardiac hx, clinical suspicion that patient will require admission to the hospital for further cardiac evaluation and stabilization.  [RS]  0101 Consult to cardiology Dr. Ethelene Browns who will consult on the patient in the emergency Department and agrees with plan for admission for chest pain  workup.  Hospitalist versus cardiology admission pending in person cardiology consultation.  I appreciate his collaboration care of the patient. [RS]  0150 Consult call from Dr. Juel Burrow, cardiology fellow, who agrees with plan for admission to the hospital for further cardiac evaluation but is requesting medicine admission.  I appreciate his collaboration care of this patient.  Hospitalist consult pending. [RS]  [phone] Consult to Dr. Janalyn Shy, who is agreeable to admitting this patient to her service.  [RS]    Clinical Course User Index [RS] Paris Lore, PA-C             HEART Score: 5                    Medical Decision Making 63 year old male presents with concern for chest pain.  History of CAD.  Hypertensive on intake, vital signs normal.  Her pulm exam  is unremarkable, abdominal exam is benign.    DDx includes limited to ACS and PE, pleural fusion, pneumonia, pneumothorax, dysrhythmia,  Amount and/or Complexity of Data Reviewed Labs: ordered.    Details: CBC without leukocytosis or anemia, BMP unremarkable, troponin negative x 2, hepatic function unremarkable,  Radiology:     Details: chest x-ray negative for acute cardiopulmonary disease. ECG/medicine tests:     Details: EKG with normal sinus rhythm without ischemic changes.  Risk Prescription drug management. Decision regarding hospitalization.  HEART score of 5.  Case discussed cardiology as above,, patient admitted to hospital medicine.  Patient's chest pain significantly improved after Nitropaste was applied to the chest.  He remains chest pain-free at this time resting comfortably.    Dominick voiced understanding of her medical evaluation and treatment plan. Each of their questions answered to their expressed satisfaction.  Return precautions were given.  Patient is well-appearing, stable, and was discharged in good condition.  This chart was dictated using voice recognition software, Dragon. Despite the best efforts of  this provider to proofread and correct errors, errors may still occur which can change documentation meaning.     re time when appropriate:1}      Final Clinical Impression(s) / ED Diagnoses Final diagnoses:  Chest pain, unspecified type    Rx / DC Orders ED Discharge Orders     None         Sherrilee Gilles 03/25/24 1914    Jacalyn Lefevre, MD 03/25/24 1620

## 2024-03-25 NOTE — Interval H&P Note (Signed)
 History and Physical Interval Note:  03/25/2024 1:54 PM  Jerry Shaffer  has presented today for surgery, with the diagnosis of [place].  The various methods of treatment have been discussed with the patient and family. After consideration of risks, benefits and other options for treatment, the patient has consented to  Procedure(s): LEFT HEART CATH AND CORONARY ANGIOGRAPHY (N/A) PERCUTANEOUS CORONARY INTERVENTION   as a surgical intervention.  The patient's history has been reviewed, patient examined, no change in status, stable for surgery.  I have reviewed the patient's chart and labs.  Questions were answered to the patient's satisfaction.    Cath Lab Visit (complete for each Cath Lab visit)  Clinical Evaluation Leading to the Procedure:   ACS: Yes.    Non-ACS:    Anginal Classification: CCS III  Anti-ischemic medical therapy: Maximal Therapy (2 or more classes of medications)  Non-Invasive Test Results: No non-invasive testing performed  Prior CABG: No previous CABG   Bryan Lemma

## 2024-03-25 NOTE — Consult Note (Signed)
 Cardiology Consultation   Patient ID: Jerry Shaffer MRN: [id number]; DOB: 01/16/1961  Admit date: 03/24/2024 Date of Consult: 03/25/2024  PCP:  Patient, No Pcp Per   [facility] Providers Cardiologist:  Jerry Fair, MD  Electrophysiologist:  Jerry Small, MD       Patient Profile:   Jerry Shaffer is a 63 y.o. 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 Particia Shaffer.  History of Present Illness:   Jerry Shaffer with the above history. Brought in from home for sharp CP around 1730. Took his SLNG x3 and appreciated some mild improvement in pain. S/p ASA 324mg  PO x1 with EMS. Got nitropaste in the ED, which he notes has reduced the severity of pain down to a 4/10 (from 10/10).   Describes the pain as a sharp stabbing pain right over his left chest, yesterday it radiated up to the jaw, but today it doesn't seem to be radiating. It woke him up from sleep today at 5pm and was so severe he called EMS to take him to the hospital. Last time he had this pain was in 11/2023 where he was noted to have stenosis of his OM2 requiring DES. After the DES, he feels like the pain improved, he would occasionally have episodes of exertional CP but those would resolve with SLNG (thinks maybe has required [facility] a total of 4 times since his OM2 stent). Two weeks ago, he went fishing and was walking through streams without this kind of pain.   Last saw cardiology Dr. Royann Shaffer on 02/26/24, back then was having exertional stable angina, uptitrated on ranolazine to maximum dose. Since then he notes that he self-d/c'd his aspirin since he's been taking goody powders for back pain and he was worried about GIB with aspirin.  Past Medical History:  Diagnosis Date   Atrial flutter (HCC)    Bipolar 1  disorder (HCC)    Coronary artery disease    Diabetes mellitus    Dyslipidemia    Emphysema    GERD (gastroesophageal reflux disease)    History of kidney stones    Hypertension    Mental disorder    BIPOLAR DISORDER   Migraine    NSTEMI (non-ST elevated myocardial infarction) (HCC) 09/20/2020   Pneumonia    PONV (postoperative nausea and vomiting)    Renal disorder    Shortness of breath    Stroke (HCC)    Transient ischemic attack (TIA)     Past Surgical History:  Procedure Laterality Date   A-FLUTTER ABLATION N/A 09/08/2022   Procedure: A-FLUTTER ABLATION;  Surgeon: Mealor, Roberts Gaudy, MD;  Location: [facility] INVASIVE CV LAB;  Service: Cardiovascular;  Laterality: N/A;   COLONOSCOPY WITH PROPOFOL N/A 10/17/2021   Procedure: COLONOSCOPY WITH PROPOFOL;  Surgeon: Jerry Fee, MD;  Location: [facility] ENDOS[facility];  Service: Endoscopy;  Laterality: N/A;   CORONARY ANGIOGRAPHY  09/20/2020   CORONARY STENT INTERVENTION  09/20/2020   CORONARY STENT INTERVENTION   CORONARY STENT INTERVENTION N/A 09/20/2020   Procedure: CORONARY STENT INTERVENTION;  Surgeon: Jerry Records, MD;  Location: [facility] INVASIVE CV LAB;  Service: Cardiovascular;  Laterality: N/A;   CORONARY STENT INTERVENTION N/A 03/28/2021   Procedure: CORONARY STENT INTERVENTION;  Surgeon: Jerry Bollman, MD;  Location:  [facility] INVASIVE CV LAB;  Service: Cardiovascular;  Laterality: N/A;   CORONARY STENT INTERVENTION N/A 11/20/2023   Procedure: CORONARY STENT INTERVENTION;  Surgeon: Jerry Bollman, MD;  Location: [facility] INVASIVE CV LAB;  Service: Cardiovascular;  Laterality: N/A;   HEMORROIDECTOMY     KNEE ARTHROSCOPY     LEFT HEART CATH AND CORONARY ANGIOGRAPHY N/A 09/20/2020   Procedure: LEFT HEART CATH AND CORONARY ANGIOGRAPHY;  Surgeon: Jerry Records, MD;  Location: [facility] INVASIVE CV LAB;  Service: Cardiovascular;  Laterality: N/A;   LEFT HEART CATH AND CORONARY ANGIOGRAPHY N/A 03/28/2021   Procedure: LEFT HEART CATH AND CORONARY ANGIOGRAPHY;   Surgeon: Jerry Bollman, MD;  Location: [facility] INVASIVE CV LAB;  Service: Cardiovascular;  Laterality: N/A;   LEFT HEART CATH AND CORONARY ANGIOGRAPHY N/A 11/20/2023   Procedure: LEFT HEART CATH AND CORONARY ANGIOGRAPHY;  Surgeon: Jerry Bollman, MD;  Location: [facility] INVASIVE CV LAB;  Service: Cardiovascular;  Laterality: N/A;   LUMBAR DISC SURGERY     POLYPECTOMY  10/17/2021   Procedure: POLYPECTOMY;  Surgeon: Jerry Fee, MD;  Location: [facility]OS[facility];  Service: Endoscopy;;   SHOULDER ARTHROSCOPY WITH DISTAL CLAVICLE RESECTION Left 01/20/2023   Procedure: SHOULDER ARTHROSCOPY WITH DISTAL CLAVICLE EXCISION;  Surgeon: Jerry Lucy, MD;  Location: [facility] ORS;  Service: Orthopedics;  Laterality: Left;   SHOULDER ARTHROSCOPY WITH ROTATOR CUFF REPAIR AND SUBACROMIAL DECOMPRESSION Left 01/20/2023   Procedure: SHOULDER ARTHROSCOPY WITH ROTATOR CUFF REPAIR AND SUBACROMIAL DECOMPRESSION;  Surgeon: Jerry Lucy, MD;  Location: [facility];  Service: Orthopedics;  Laterality: Left;   TONSILLECTOMY AND ADENOIDECTOMY       Home Medications:  Prior to Admission medications   Medication Sig Start Date End Date Taking? Authorizing Provider  albuterol (VENTOLIN HFA) 108 (90 Base) MCG/ACT inhaler Inhale 1-2 puffs into the lungs every 6 (six) hours as needed for wheezing or shortness of breath. 08/21/23  Yes Croitoru, Mihai, MD  atorvastatin (LIPITOR) 80 MG tablet TAKE 1 TABLET BY MOUTH EVERY DAY 06/16/23  Yes Croitoru, Mihai, MD  clopidogrel (PLAVIX) 75 MG tablet Take 1 tablet (75 mg total) by mouth daily with breakfast. 12/08/23  Yes Jerry Gross, NP  glipiZIDE (GLUCOTROL) 5 MG tablet Take 1 tablet (5 mg total) by mouth 2 (two) times daily before a meal. 10/27/23  Yes Jerry Pavlov, MD  metFORMIN (GLUCOPHAGE-XR) 500 MG 24 hr tablet Take 4 tablets (2,000 mg total) by mouth daily. 09/28/23  Yes Jerry Pavlov, MD  methocarbamol (ROBAXIN) 500 MG tablet Take 1 tablet (500 mg total) by mouth every 8 (eight) hours as  needed for muscle spasms. 01/21/23  Yes Jerry Ores K, PA-C  metoprolol tartrate (LOPRESSOR) 100 MG tablet Take 1 tablet (100 mg total) by mouth 2 (two) times daily. 08/21/23  Yes Croitoru, Mihai, MD  nitroGLYCERIN (NITROSTAT) 0.4 MG SL tablet Place 1 tablet (0.4 mg total) under the tongue every 5 (five) minutes as needed for chest pain. 12/08/23 03/25/25 Yes Jerry Gross, NP  OVER THE COUNTER MEDICATION Take 1-2 packets by mouth daily. Goody's Extra Strength Headache Powder   Yes [provider]  pantoprazole (PROTONIX) 40 MG tablet Take 1 tablet (40 mg total) by mouth daily. 02/26/24  Yes Croitoru, Mihai, MD  ranolazine (RANEXA) 500 MG 12 hr tablet Take 2 tablets (1,000 mg total) by mouth 2 (two) times daily. 02/26/24  Yes Croitoru, Mihai, MD  aspirin EC 81 MG tablet Take 1 tablet (81 mg total) by mouth daily. Swallow whole. Patient not taking: Reported on 03/25/2024 08/21/23  Croitoru, Mihai, MD    Inpatient Medications: Scheduled Meds:  Continuous Infusions:  PRN Meds:   Allergies:    Allergies  Allergen Reactions   Bee Venom Anaphylaxis   Penicillins Anaphylaxis    Did it involve swelling of the face/tongue/throat, SOB, or low BP? yes Did it involve sudden or severe rash/hives, skin peeling, or any reaction on the inside of your mouth or nose? unknown Did you need to seek medical attention at a hospital or doctor's office? yes When did it last happen?      1967 If all above answers are "NO", may proceed with cephalosporin use.    Procaine Anaphylaxis   Isosorbide Other (See Comments)    Caused migraines   Tape Itching and Other (See Comments)    Certain medical tapes make the patient's skin ITCH    Social History:   Social History   Socioeconomic History   Marital status: Single    Spouse name: Not on file   Number of children: Not on file   Years of education: Not on file   Highest education level: Not on file  Occupational History   Occupation: Security  guard    Employer: [employer]  Tobacco Use   Smoking status: Former    Types: E-cigarettes   Smokeless tobacco: Former    Quit date: 2022  Vaping Use   Vaping status: Former  Substance and Sexual Activity   Alcohol use: No   Drug use: Not Currently   Sexual activity: Not Currently  Other Topics Concern   Not on file  Social History Narrative   Not on file   Social Drivers of Health   Financial Resource Strain: Not on file  Food Insecurity: No Food Insecurity (11/21/2023)   Hunger Vital Sign    Worried About Running Out of Food in the Last Year: Never true    Ran Out of Food in the Last Year: Never true  Transportation Needs: No Transportation Needs (11/21/2023)   PRAPARE - Administrator, Civil Service (Medical): No    Lack of Transportation (Non-Medical): No  Physical Activity: Not on file  Stress: Not on file  Social Connections: Not on file  Intimate Partner Violence: Not At Risk (11/21/2023)   Humiliation, Afraid, Rape, and Kick questionnaire    Fear of Current or Ex-Partner: No    Emotionally Abused: No    Physically Abused: No    Sexually Abused: No    Family History:   Family History  Problem Relation Age of Onset   Diabetes type II Other    Coronary artery disease Other    Bipolar disorder Other    Lung cancer Father    CAD Mother 73     ROS:  Please see the history of present illness.  All other ROS reviewed and negative.     Physical Exam/Data:   Vitals:   03/25/24 0030 03/25/24 0031 03/25/24 0100 03/25/24 0130  BP: (!) 150/133  (!) 181/99 (!) 156/92  Pulse: 65  62 61  Resp: 20  (!) 23 19  Temp:  97.8 F (36.6 C)    TempSrc:  Oral    SpO2: 97%  98% 98%  Weight:      Height:       No intake or output data in the 24 hours ending 03/25/24 0210    03/24/2024    8:22 PM 03/03/2024    8:46 AM 02/26/2024    8:50 AM  Last 3  Weights  Weight (lbs) 238 lb 12.1 oz 238 lb 12.8 oz 237 lb 12.8 oz  Weight (kg) 108.3 kg 108.319 kg 107.865  kg     Body mass index is 36.3 kg/m.  General:  Well nourished, well developed, in no acute distress HEENT: normal Cardiac:  normal S1, S2; RRR; no murmur  Lungs:  clear to auscultation bilaterally, no wheezing, rhonchi or rales  Abd: soft, nontender, no hepatomegaly  Ext: no edema Skin: warm and dry  Neuro:  alert and awake, grossly oriented Psych:  Normal affect   EKG:  The EKG was personally reviewed and demonstrates:  normal sinus rhythm, no TWI or ST changes Telemetry:  Telemetry was personally reviewed and demonstrates:  normal sinus rhythm  Relevant CV Studies: Coronary angiography/LHC 11/20/23: 1.  Patent left main with no significant stenosis 2.  Patent LAD with a widely patent proximal LAD stent site and no significant restenosis.  The mid LAD bifurcation disease is unchanged with nonobstructive plaquing involving a 35% stenosis at the ostium of the diagonal and a 50% stenosis in the LAD 3.  Progressive and severe stenosis of the second OM branch of the left circumflex, reduced from 99 to 0% with a 2.5 x 38 mm Synergy DES 4.  Large, dominant RCA with mild nonobstructive plaquing and patent stent in the PDA unchanged from the previous study 5.  Normal LVEDP  Coronary angiography/LHC 03/28/21: 1.  Patent left main 2.  Severe stenosis of the proximal LAD, new from cardiac catheterization 6 months ago, treated successfully with PCI using a 3.5 x 20 mm Synergy DES.  The mid LAD is unchanged from the prior study with [place] 0, 1, 1 bifurcation disease with only moderate stenosis in the LAD and mild stenosis in the diagonal 3.  Moderate 50% mid circumflex stenosis 4.  Continued patency of the right PDA stent with mild diffuse nonobstructive PDA and RCA stenosis 5.  Normal LVEDP  Coronary angiography/LHC 09/20/20: Total occlusion of the mid PDA (a large vessel that extends to the left ventricular apex), filling sluggishly by apical LAD to PDA collaterals.  This vessel is felt to  represent the culprit for the patient's presentation. Total occlusion of the mid PDA reduced to 0% using an 18 x 2.75 mm Onyx deployed at 12 atm x 2 inflations.  TIMI grade III flow was noted.  Resolution of chest pain occurred.  Proximal to the stented segment there is a 30 to 40% stenosis in the PDA. Left main is widely patent LAD contains mid vessel disease with a [place] 011 bifurcation stenosis.  LAD is 70% and diagonal is 40%.  This can be managed medically. Circumflex contains segmental 40 to 50% stenosis in the large second obtuse marginal. RCA is dominant, tortuous, and no significant disease other than that noted above in the PDA. Mid inferior wall to apical akinesis.  LVEDP 6 mmHg.  EF 45 to 50%.  NM MPI 11/03/23:   Findings are consistent with ischemia in the distribution of a distal oblique marginal branch of the left circumflex coronary artery or a posterolateral ventricular artery branch of the right coronary artery. The study is intermediate risk.   LV perfusion is abnormal. There is evidence of ischemia. There is no evidence of infarction. Defect 1: There is a medium defect with moderate reduction in uptake present in the mid to basal inferolateral location(s) that is reversible. There is normal wall motion in the defect area. Consistent with ischemia. The defect is consistent with  abnormal perfusion in the LCx or RCA territories.   Rest left ventricular function is normal. Rest EF: 51%. Stress left ventricular function is normal. Stress EF: 58%. End diastolic cavity size is normal. End systolic cavity size is normal.   Myocardial blood flow was computed to be 0.69ml/g/min at rest and 1.36ml/g/min at stress. Global myocardial blood flow reserve was 2.26 and was normal. MBF reserve was reduced in the left circumflex artery territory (1.71), particularly reduced in the area of the reversible perfusion defect (1.24)   Coronary calcium assessment not performed due to prior  revascularization.  TTE 09/06/22: 1. Left ventricular ejection fraction, by estimation, is 60 to 65%. The  left ventricle has normal function. The left ventricle has no regional  wall motion abnormalities. There is mild left ventricular hypertrophy.  Left ventricular diastolic parameters  are indeterminate.   2. Right ventricular systolic function is normal. The right ventricular  size is normal.   3. Left atrial size was mildly dilated.   4. The mitral valve is normal in structure. No evidence of mitral valve  regurgitation. No evidence of mitral stenosis.   5. The aortic valve was not well visualized. Aortic valve regurgitation  is not visualized. No aortic stenosis is present.   6. The inferior vena cava is normal in size with greater than 50%  respiratory variability, suggesting right atrial pressure of 3 mmHg.   Laboratory Data:  High Sensitivity Troponin:   Recent Labs  Lab 03/24/24 2009 03/24/24 2210  TROPONINIHS 8 7     Chemistry Recent Labs  Lab 03/24/24 2009  NA 139  Shaffer 3.9  CL 105  CO2 24  GLUCOSE 173*  BUN 11  CREATININE 0.81  CALCIUM 8.8*  GFRNONAA >60  ANIONGAP 10    Recent Labs  Lab 03/24/24 2210  PROT 5.6*  ALBUMIN 3.4*  AST 17  ALT 20  ALKPHOS 73  BILITOT 0.7   Lipids  Recent Labs  Lab 03/24/24 2210  CHOL 91  TRIG 74  HDL 29*  LDLCALC 47  CHOLHDL 3.1    Hematology Recent Labs  Lab 03/24/24 2009  WBC 8.0  RBC 4.55  HGB 13.1  HCT 40.2  MCV 88.4  MCH 28.8  MCHC 32.6  RDW 13.4  PLT 227   Thyroid No results for input(s): "TSH", "FREET4" in the last 168 hours.  BNPNo results for input(s): "BNP", "PROBNP" in the last 168 hours.  DDimer No results for input(s): "DDIMER" in the last 168 hours.   Radiology/Studies:  DG Chest 1 View Result Date: 03/24/2024 CLINICAL DATA:  Chest pain EXAM: CHEST  1 VIEW COMPARISON:  11/20/2023 FINDINGS: The heart size and mediastinal contours are within normal limits. Both lungs are clear. The  visualized skeletal structures are unremarkable. IMPRESSION: No active disease. Electronically Signed   By: Jasmine Pang M.D.   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 Now presenting for chest pain that he notes is similar to his anginal pain back in 11/2023 when he received his OM2 stent. Troponins fortunately negative x2 and EKG without acute ischemic changes. It seems a little strange that he would have interval development of new coronary stenosis after a catheterization  only 4 months ago, but nitropaste seems to be helping his chest pain and he has contributing risk factors with his elevated Lp(a) and self-d/c of aspirin. I think it would be worth admission and more expedited workup for high risk chest pain.Will keep NPO in case need for repeat coronary angiography in the morning for unstable angina. Alternatively, given how recent his last coronary angio was, could repeat stress testing to assess for new regions of ischemia that may indicate need for invasive angiography. As far as his antianginals are concerned, he is on max dose ranolazine, max dose metoprolol, has been intolerant to nitrates. - Continue home ranolazine 1000mg  BID - Continue home metoprolol tartrate 100mg  BID - Continue home plavix 75mg  every day - Continue home aspirin 81mg  every day (was no longer taking it at home but should be restarted on it) - Continue home atorvastatin 80mg  every day (LDL 47) - Keep NPO at MN in case need for repeat coronary angiography in the morning  Risk Assessment/Risk Scores:     TIMI Risk Score for Unstable Angina or Non-ST Elevation MI:   The patient's TIMI risk score is 4, which indicates a 20% risk of all cause  mortality, new or recurrent myocardial infarction or need for urgent revascularization in the next 14 days.    For questions or updates, please contact [facility] Please consult [email or web address] for contact info under    Signed, Bella Kennedy, MD  03/25/2024 2:10 AM

## 2024-03-25 NOTE — Progress Notes (Addendum)
 DAILY PROGRESS NOTE   Patient Name: Jerry Shaffer Date of Encounter: 03/25/2024 Cardiologist: Thurmon Fair, MD  Chief Complaint   Persistent chest pain  Patient Profile   Jerry Shaffer is a 63 y.o. 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 Particia Nearing.   Subjective   Overnight consult note reviewed.  He reports still 2 out of 10 chest pain with a Nitropatch in place.  Echocardiogram is in progress.  He has had crescendo anginal symptoms recently which has been managed medically.  He is on maximal antianginal therapy but he awakened with more substantial chest pain than his baseline.  Symptoms are similar to his angina in the past notably in the left side of his chest radiating to the neck.  Objective   Vitals:   03/25/24 0600 03/25/24 0700 03/25/24 0815 03/25/24 0830  BP: (!) 143/88 (!) 178/94 (!) 144/93 (!) 173/95  Pulse: (!) 59 73 69 71  Resp: 19 20 10 18   Temp:      TempSrc:      SpO2: 97% 96% 96% 97%  Weight:      Height:       No intake or output data in the 24 hours ending 03/25/24 0922 Filed Weights   03/24/24 2022  Weight: 108.3 kg    Physical Exam   General appearance: alert and no distress Lungs: clear to auscultation bilaterally Heart: regular rate and rhythm, S1, S2 normal, no murmur, click, rub or gallop Extremities: extremities normal, atraumatic, no cyanosis or edema Neurologic: Grossly normal  Inpatient Medications    Scheduled Meds:  aspirin EC  81 mg Oral Daily   atorvastatin  80 mg Oral QPM   clopidogrel  75 mg Oral Q breakfast   enoxaparin (LOVENOX) injection  40 mg Subcutaneous Q24H   insulin aspart  0-5 Units Subcutaneous QHS   insulin aspart  0-9 Units Subcutaneous TID WC   metoprolol tartrate  100 mg Oral BID   pantoprazole  40  mg Oral Daily   ranolazine  1,000 mg Oral BID   sodium chloride flush  3 mL Intravenous Q12H    Continuous Infusions:   PRN Meds: acetaminophen **OR** acetaminophen, albuterol, butalbital-acetaminophen-caffeine, ondansetron **OR** ondansetron (ZOFRAN) IV, perflutren lipid microspheres (DEFINITY) IV suspension   Labs   Results for orders placed or performed during the hospital encounter of 03/24/24 (from the past 48 hours)  Basic metabolic panel     Status: Abnormal   Collection Time: 03/24/24  8:09 PM  Result Value Ref Range   Sodium 139 135 - 145 mmol/L   Potassium 3.9 3.5 - 5.1 mmol/L   Chloride 105 98 - 111 mmol/L   CO2 24 22 - 32 mmol/L   Glucose, Bld 173 (H) 70 - 99 mg/dL    Comment: Glucose reference range applies only to samples taken after fasting for at least 8 hours.   BUN 11 8 - 23 mg/dL   Creatinine, Ser 1.91 0.61 - 1.24 mg/dL   Calcium 8.8 (L) 8.9 - 10.3 mg/dL   GFR, Estimated >47 >82 mL/min    Comment: (NOTE) Calculated using the CKD-EPI Creatinine Equation (2021)    Anion gap 10 5 - 15    Comment: Performed at [facility],  [address] [place], [place] [address]  CBC     Status: None   Collection Time: 03/24/24  8:09 PM  Result Value Ref Range   WBC 8.0 4.0 - 10.5 K/uL   RBC 4.55 4.22 - 5.81 MIL/uL   Hemoglobin 13.1 13.0 - 17.0 g/dL   HCT 60.4 54.0 - 98.1 %   MCV 88.4 80.0 - 100.0 fL   MCH 28.8 26.0 - 34.0 pg   MCHC 32.6 30.0 - 36.0 g/dL   RDW 19.1 47.8 - 29.5 %   Platelets 227 150 - 400 K/uL   nRBC 0.0 0.0 - 0.2 %    Comment: Performed at [facility], [address] [place], [place] [address]  Troponin I (High Sensitivity)     Status: None   Collection Time: 03/24/24  8:09 PM  Result Value Ref Range   Troponin I (High Sensitivity) 8 <18 ng/L    Comment: (NOTE) Elevated high sensitivity troponin I (hsTnI) values and significant  changes across serial measurements may suggest ACS but many other  chronic and acute conditions are  known to elevate hsTnI results.  Refer to the "Links" section for chest pain algorithms and additional  guidance. Performed at [facility], [address] [place], [place] [address]   Hemoglobin A1c     Status: Abnormal   Collection Time: 03/24/24  8:09 PM  Result Value Ref Range   Hgb A1c MFr Bld 6.4 (H) 4.8 - 5.6 %    Comment: (NOTE) Pre diabetes:          5.7%-6.4%  Diabetes:              >6.4%  Glycemic control for   <7.0% adults with diabetes    Mean Plasma Glucose 136.98 mg/dL    Comment: Performed at [facility], [address], [place], [place] [address]  Troponin I (High Sensitivity)     Status: None   Collection Time: 03/24/24 10:10 PM  Result Value Ref Range   Troponin I (High Sensitivity) 7 <18 ng/L    Comment: (NOTE) Elevated high sensitivity troponin I (hsTnI) values and significant  changes across serial measurements may suggest ACS but many other  chronic and acute conditions are known to elevate hsTnI results.  Refer to the "Links" section for chest pain algorithms and additional  guidance. Performed at [facility], [address], [place], [place] [address]   Hepatic function panel     Status: Abnormal   Collection Time: 03/24/24 10:10 PM  Result Value Ref Range   Total Protein 5.6 (L) 6.5 - 8.1 g/dL   Albumin 3.4 (L) 3.5 - 5.0 g/dL   AST 17 15 - 41 U/L   ALT 20 0 - 44 U/L   Alkaline Phosphatase 73 38 - 126 U/L   Total Bilirubin 0.7 0.0 - 1.2 mg/dL   Bilirubin, Direct <1.3 0.0 - 0.2 mg/dL    Comment: REPEATED TO VERIFY   Indirect Bilirubin NOT CALCULATED 0.3 - 0.9 mg/dL    Comment: Performed at [facility], [address] [place], [place] [address]  Lipid panel     Status: Abnormal   Collection Time: 03/24/24 10:10 PM  Result Value Ref Range   Cholesterol 91 0 - 200 mg/dL   Triglycerides 74 <027 mg/dL   HDL 29 (L) >25 mg/dL   Total CHOL/HDL Ratio 3.1 RATIO   VLDL 15 0 - 40 mg/dL   LDL Cholesterol  47 0 - 99 mg/dL     Comment:        Total Cholesterol/HDL:CHD Risk Coronary Heart Disease Risk Table                     Men   Women  1/2 Average Risk   3.4   3.3  Average Risk       5.0   4.4  2 X Average Risk   9.6   7.1  3 X Average Risk  23.4   11.0        Use the calculated Patient Ratio above and the CHD Risk Table to determine the patient's CHD Risk.        ATP III CLASSIFICATION (LDL):  <100     mg/dL   Optimal  960-454  mg/dL   Near or Above                    Optimal  130-159  mg/dL   Borderline  098-119  mg/dL   High  >147     mg/dL   Very High Performed at [facility], [address] [place], [place] [address]   CBG monitoring, ED     Status: Abnormal   Collection Time: 03/25/24  8:13 AM  Result Value Ref Range   Glucose-Capillary 143 (H) 70 - 99 mg/dL    Comment: Glucose reference range applies only to samples taken after fasting for at least 8 hours.    ECG   Normal sinus rhythm at 69 (03/24/2024)- Personally Reviewed  Telemetry   Sinus rhythm- Personally Reviewed  Radiology    ECHOCARDIOGRAM COMPLETE Result Date: 03/25/2024    ECHOCARDIOGRAM REPORT   Patient Name:   [name] Date of Exam: 03/25/2024 Medical Rec #:  [id number]       Height:       68.0 in Accession #:    [id number]      Weight:       238.8 lb Date of Birth:  07/25/1961      BSA:          2.204 m Patient Age:    62 years        BP:           173/95 mmHg Patient Gender: M               HR:           73 bpm. Exam Location:  Inpatient Procedure: 2D Echo, Cardiac Doppler, Color Doppler and Intracardiac            Opacification Agent (Both Spectral and Color Flow Doppler were            utilized during procedure). Indications:    Chest Pain  History:        Patient has prior history of Echocardiogram examinations.                 NSTEMI, CAD, Stroke and TIA, Signs/Symptoms:Chest Pain; Risk                 Factors:Former Smoker, Diabetes and HLD.  Sonographer:    Lamont Snowball Referring Phys: [id number] RONDELL A  SMITH IMPRESSIONS  1. Left ventricular ejection fraction, by estimation, is 55 to 60%. The left ventricle has normal function. The left ventricle has no regional wall motion abnormalities. There is mild concentric left ventricular hypertrophy. Left ventricular diastolic parameters were normal.  2. Right ventricular systolic  function is normal. The right ventricular size is normal. Tricuspid regurgitation signal is inadequate for assessing PA pressure.  3. The mitral valve is normal in structure. No evidence of mitral valve regurgitation. No evidence of mitral stenosis.  4. The aortic valve is normal in structure. Aortic valve regurgitation is not visualized. No aortic stenosis is present.  5. The inferior vena cava is normal in size with greater than 50% respiratory variability, suggesting right atrial pressure of 3 mmHg. FINDINGS  Left Ventricle: Left ventricular ejection fraction, by estimation, is 55 to 60%. The left ventricle has normal function. The left ventricle has no regional wall motion abnormalities. Definity contrast agent was given IV to delineate the left ventricular  endocardial borders. The left ventricular internal cavity size was normal in size. There is mild concentric left ventricular hypertrophy. Left ventricular diastolic parameters were normal. Right Ventricle: The right ventricular size is normal. No increase in right ventricular wall thickness. Right ventricular systolic function is normal. Tricuspid regurgitation signal is inadequate for assessing PA pressure. Left Atrium: Left atrial size was normal in size. Right Atrium: Right atrial size was normal in size. Prominent Chiari network. Pericardium: There is no evidence of pericardial effusion. Mitral Valve: The mitral valve is normal in structure. No evidence of mitral valve regurgitation. No evidence of mitral valve stenosis. MV peak gradient, 3.6 mmHg. The mean mitral valve gradient is 2.0 mmHg. Tricuspid Valve: The tricuspid valve is  normal in structure. Tricuspid valve regurgitation is trivial. No evidence of tricuspid stenosis. Aortic Valve: The aortic valve is normal in structure. Aortic valve regurgitation is not visualized. No aortic stenosis is present. Aortic valve peak gradient measures 6.4 mmHg. Pulmonic Valve: The pulmonic valve was normal in structure. Pulmonic valve regurgitation is not visualized. No evidence of pulmonic stenosis. Aorta: The aortic root is normal in size and structure. Venous: The inferior vena cava is normal in size with greater than 50% respiratory variability, suggesting right atrial pressure of 3 mmHg. IAS/Shunts: No atrial level shunt detected by color flow Doppler.  LEFT VENTRICLE PLAX 2D LVIDd:         4.30 cm   Diastology LVIDs:         3.00 cm   LV e' medial:    5.22 cm/s LV PW:         1.20 cm   LV E/e' medial:  14.3 LV IVS:        1.40 cm   LV e' lateral:   8.59 cm/s LVOT diam:     2.00 cm   LV E/e' lateral: 8.7 LV SV:         79 LV SV Index:   36 LVOT Area:     3.14 cm  RIGHT VENTRICLE             IVC RV Basal diam:  3.60 cm     IVC diam: 2.20 cm RV S prime:     13.80 cm/s TAPSE (M-mode): 2.0 cm LEFT ATRIUM             Index        RIGHT ATRIUM           Index LA Vol (A2C):   69.3 ml 31.45 ml/m  RA Area:     17.40 cm LA Vol (A4C):   57.3 ml 26.00 ml/m  RA Volume:   45.80 ml  20.78 ml/m LA Biplane Vol: 64.6 ml 29.32 ml/m  AORTIC VALVE AV Area (Vmax): 2.74 cm AV Vmax:  126.00 cm/s AV Peak Grad:   6.4 mmHg LVOT Vmax:      110.00 cm/s LVOT Vmean:     72.300 cm/s LVOT VTI:       0.251 m  AORTA Ao Root diam: 3.60 cm Ao Asc diam:  3.80 cm MITRAL VALVE MV Area (PHT): 2.56 cm    SHUNTS MV Area VTI:   3.35 cm    Systemic VTI:  0.25 m MV Peak grad:  3.6 mmHg    Systemic Diam: 2.00 cm MV Mean grad:  2.0 mmHg MV Vmax:       0.95 m/s MV Vmean:      68.2 cm/s MV Decel Time: 296 msec MV E velocity: 74.90 cm/s MV A velocity: 81.80 cm/s MV E/A ratio:  0.92 Clearnce Hasten Electronically signed by Clearnce Hasten Signature Date/Time: 03/25/2024/9:14:02 AM    Final    DG Chest 1 View Result Date: 03/24/2024 CLINICAL DATA:  Chest pain EXAM: CHEST  1 VIEW COMPARISON:  11/20/2023 FINDINGS: The heart size and mediastinal contours are within normal limits. Both lungs are clear. The visualized skeletal structures are unremarkable. IMPRESSION: No active disease. Electronically Signed   By: Jasmine Pang M.D.   On: 03/24/2024 23:11    Cardiac Studies   See echo above  Assessment   Principal Problem:   Unstable angina [facility]) Active Problems:   Coronary artery disease involving native coronary artery with angina pectoris [facility])   Plan   Mr. Kall presented with acute on chronic anginal symptoms, more severe than it has been recently despite being on maximal antianginal therapy.  He was seen back in March and noted to be having anginal symptoms.  His last catheterization was in December, at which time he underwent PCI to the left circumflex with a drug-eluting stent.  He was also noted to have about a 50% mid LAD stenosis at the time.  Given his crescendo symptoms and being on maximal antianginal therapy, I believe repeat cardiac catheterization is warranted, despite negative troponins.  He is in agreement.  Echo today showed normal LVEF and normal diastolic function without significant valvular disease.  He has been n.p.o. since yesterday evening.  Plan to proceed with definitive cardiac catheterization today.  Informed Consent   Shared Decision Making/Informed Consent The risks [stroke (1 in 1000), death (1 in 1000), kidney failure [usually temporary] (1 in 500), bleeding (1 in 200), allergic reaction [possibly serious] (1 in 200)], benefits (diagnostic support and management of coronary artery disease) and alternatives of a cardiac catheterization were discussed in detail with Mr. Lia and he is willing to proceed.    Time Spent Directly with Patient:  I have spent a total of 25 minutes with the  patient reviewing hospital notes, telemetry, EKGs, labs and examining the patient as well as establishing an assessment and plan that was discussed personally with the patient.  > 50% of time was spent in direct patient care.  Length of Stay:  LOS: 0 days   Chrystie Nose, MD, [facility], FACP  [facility]  Medical Director of the Advanced Lipid Disorders &  Cardiovascular Risk Reduction Clinic Diplomate of the American Board of Clinical Lipidology Attending Cardiologist  Direct Dial: [phone]  Fax: [phone]  Website:  [email or web address]  Lisette Abu Yuliet Needs 03/25/2024, 9:22 AM

## 2024-04-11 ENCOUNTER — Telehealth: Payer: Self-pay | Admitting: Emergency Medicine

## 2024-04-11 ENCOUNTER — Encounter: Payer: Self-pay | Admitting: Emergency Medicine

## 2024-04-11 NOTE — Telephone Encounter (Signed)
 Pt returned call. ID [id number]. Informed of results. No questions at this time

## 2024-04-11 NOTE — Telephone Encounter (Signed)
 Called to give CT chest results. Left call back number. Results letter mailed to patient.

## 2024-04-27 ENCOUNTER — Other Ambulatory Visit: Payer: Self-pay | Admitting: Cardiovascular Disease

## 2024-05-10 ENCOUNTER — Other Ambulatory Visit: Payer: Self-pay

## 2024-05-10 ENCOUNTER — Encounter (HOSPITAL_COMMUNITY): Payer: Self-pay

## 2024-05-10 ENCOUNTER — Emergency Department (HOSPITAL_COMMUNITY)
Admission: EM | Admit: 2024-05-10 | Discharge: 2024-05-10 | Disposition: A | Discharge status: Home / Self Care | Payer: Self-pay | Attending: Emergency Medicine | Admitting: Emergency Medicine

## 2024-05-10 DIAGNOSIS — E860 Dehydration: Secondary | ICD-10-CM | POA: Insufficient documentation

## 2024-05-10 DIAGNOSIS — Z7902 Long term (current) use of antithrombotics/antiplatelets: Secondary | ICD-10-CM | POA: Insufficient documentation

## 2024-05-10 DIAGNOSIS — R55 Syncope and collapse: Secondary | ICD-10-CM | POA: Insufficient documentation

## 2024-05-10 DIAGNOSIS — J449 Chronic obstructive pulmonary disease, unspecified: Secondary | ICD-10-CM | POA: Insufficient documentation

## 2024-05-10 DIAGNOSIS — Z8673 Personal history of transient ischemic attack (TIA), and cerebral infarction without residual deficits: Secondary | ICD-10-CM | POA: Insufficient documentation

## 2024-05-10 DIAGNOSIS — Z7982 Long term (current) use of aspirin: Secondary | ICD-10-CM | POA: Insufficient documentation

## 2024-05-10 DIAGNOSIS — E119 Type 2 diabetes mellitus without complications: Secondary | ICD-10-CM | POA: Insufficient documentation

## 2024-05-10 DIAGNOSIS — Z7984 Long term (current) use of oral hypoglycemic drugs: Secondary | ICD-10-CM | POA: Insufficient documentation

## 2024-05-10 LAB — CBC WITH DIFFERENTIAL/PLATELET
Abs Immature Granulocytes: 0.04 10*3/uL (ref 0.00–0.07)
Basophils Absolute: 0.1 10*3/uL (ref 0.0–0.1)
Basophils Relative: 1 %
Eosinophils Absolute: 0.4 10*3/uL (ref 0.0–0.5)
Eosinophils Relative: 5 %
HCT: 44.7 % (ref 39.0–52.0)
Hemoglobin: 14.7 g/dL (ref 13.0–17.0)
Immature Granulocytes: 0 %
Lymphocytes Relative: 18 %
Lymphs Abs: 1.7 10*3/uL (ref 0.7–4.0)
MCH: 28.9 pg (ref 26.0–34.0)
MCHC: 32.9 g/dL (ref 30.0–36.0)
MCV: 87.8 fL (ref 80.0–100.0)
Monocytes Absolute: 0.6 10*3/uL (ref 0.1–1.0)
Monocytes Relative: 6 %
Neutro Abs: 6.5 10*3/uL (ref 1.7–7.7)
Neutrophils Relative %: 70 %
Platelets: 286 10*3/uL (ref 150–400)
RBC: 5.09 MIL/uL (ref 4.22–5.81)
RDW: 13.5 % (ref 11.5–15.5)
WBC: 9.3 10*3/uL (ref 4.0–10.5)
nRBC: 0 % (ref 0.0–0.2)

## 2024-05-10 LAB — CBG MONITORING, ED: Glucose-Capillary: 106 mg/dL — ABNORMAL HIGH (ref 70–99)

## 2024-05-10 LAB — BASIC METABOLIC PANEL WITH GFR
Anion gap: 12 (ref 5–15)
BUN: 15 mg/dL (ref 8–23)
CO2: 21 mmol/L — ABNORMAL LOW (ref 22–32)
Calcium: 8.9 mg/dL (ref 8.9–10.3)
Chloride: 106 mmol/L (ref 98–111)
Creatinine, Ser: 0.85 mg/dL (ref 0.61–1.24)
GFR, Estimated: >60 mL/min (ref >60)
Glucose, Bld: 107 mg/dL — ABNORMAL HIGH (ref 70–99)
Potassium: 4.2 mmol/L (ref 3.5–5.1)
Sodium: 139 mmol/L (ref 135–145)

## 2024-05-10 LAB — MAGNESIUM: Magnesium: 1.7 mg/dL (ref 1.7–2.4)

## 2024-05-10 LAB — TROPONIN I (HIGH SENSITIVITY)
Troponin I (High Sensitivity): 7 ng/L (ref <18)
Troponin I (High Sensitivity): 7 ng/L (ref <18)

## 2024-05-10 MED ORDER — LACTATED RINGERS IV BOLUS
1000.0000 mL | Freq: Once | INTRAVENOUS | Status: AC
  Administered 2024-05-10: 1000 mL via INTRAVENOUS

## 2024-05-10 NOTE — ED Provider Notes (Signed)
 [place] EMERGENCY DEPARTMENT AT [facility] Provider Note   CSN: [id number] Arrival date & time: 05/10/24  1714     History  No chief complaint on file.   Jerry Shaffer is a 63 y.o. male.  HPI Patient presents for near syncope.  Medical history includes DM, migraines, GERD, CAD, HLD, atrial flutter, CVA.  She is followed by Dr. Tita Form.  She underwent heart cath in November of last year.  Stent was placed.  He is currently on Plavix  only.  He works the night shift as a Electrical engineer.  Over the weekend, he did have symptoms of nausea, vomiting, and diarrhea.  These have resolved.  Last night, while at work, he developed near syncopal symptoms, worsened with standing.  He had some mild intermittent left-sided chest pain which has now resolved.  He continues to have near syncopal symptoms with standing.  He denies any symptoms at rest.    Home Medications Prior to Admission medications   Medication Sig Start Date End Date Taking? Authorizing Provider  albuterol  (VENTOLIN  HFA) 108 (90 Base) MCG/ACT inhaler Inhale 1-2 puffs into the lungs every 6 (six) hours as needed for wheezing or shortness of breath. 08/21/23   Croitoru, Mihai, MD  aspirin  EC 81 MG tablet Take 1 tablet (81 mg total) by mouth daily. Swallow whole. Patient not taking: Reported on 03/25/2024 08/21/23   Croitoru, Karyl Paget, MD  atorvastatin  (LIPITOR ) 80 MG tablet TAKE 1 TABLET BY MOUTH EVERY DAY 06/16/23   Croitoru, Karyl Paget, MD  clopidogrel  (PLAVIX ) 75 MG tablet Take 1 tablet (75 mg total) by mouth daily with breakfast. 12/08/23   Tania Familia, NP  glipiZIDE  (GLUCOTROL ) 5 MG tablet Take 1 tablet (5 mg total) by mouth 2 (two) times daily before a meal. 10/27/23   Emilie Harden, MD  metFORMIN  (GLUCOPHAGE -XR) 500 MG 24 hr tablet Take 4 tablets (2,000 mg total) by mouth daily. 09/28/23   Emilie Harden, MD  methocarbamol  (ROBAXIN ) 500 MG tablet Take 1 tablet (500 mg total) by mouth every 8 (eight) hours as needed for  muscle spasms. 01/21/23   Brown, Blaine K, PA-C  metoprolol  tartrate (LOPRESSOR ) 100 MG tablet Take 1 tablet (100 mg total) by mouth 2 (two) times daily. 08/21/23   Croitoru, Mihai, MD  nitroGLYCERIN  (NITROSTAT ) 0.4 MG SL tablet Place 1 tablet (0.4 mg total) under the tongue every 5 (five) minutes as needed for chest pain. 12/08/23 03/25/25  Tania Familia, NP  OVER THE COUNTER MEDICATION Take 1-2 packets by mouth daily. [employer] Extra Strength Headache Powder    [provider]  pantoprazole  (PROTONIX ) 40 MG tablet Take 1 tablet (40 mg total) by mouth daily. 02/26/24   Croitoru, Mihai, MD  ranolazine  (RANEXA ) 500 MG 12 hr tablet Take 2 tablets (1,000 mg total) by mouth 2 (two) times daily. 02/26/24   Croitoru, Mihai, MD      Allergies    Bee venom, Penicillins, Procaine, Isosorbide , and Tape    Review of Systems   Review of Systems  Cardiovascular:  Positive for chest pain.  Neurological:  Positive for dizziness and light-headedness.  All other systems reviewed and are negative.   Physical Exam Updated Vital Signs BP 121/80 (BP Location: Right Arm)   Pulse 78   Temp 98.3 F (36.8 C) (Oral)   Resp (!) 21   SpO2 100%  Physical Exam Vitals and nursing note reviewed.  Constitutional:      General: He is not in acute distress.  Appearance: Normal appearance. He is well-developed. He is not ill-appearing, toxic-appearing or diaphoretic.  HENT:     Head: Normocephalic and atraumatic.     Right Ear: External ear normal.     Left Ear: External ear normal.     Nose: Nose normal.     Mouth/Throat:     Mouth: Mucous membranes are moist.  Eyes:     Extraocular Movements: Extraocular movements intact.     Conjunctiva/sclera: Conjunctivae normal.  Cardiovascular:     Rate and Rhythm: Normal rate and regular rhythm.     Heart sounds: No murmur heard. Pulmonary:     Effort: Pulmonary effort is normal. No respiratory distress.     Breath sounds: Normal breath sounds. No wheezing  or rales.  Abdominal:     General: There is no distension.     Palpations: Abdomen is soft.     Tenderness: There is no abdominal tenderness.  Musculoskeletal:        General: No swelling. Normal range of motion.     Cervical back: Normal range of motion and neck supple.  Skin:    General: Skin is warm and dry.     Coloration: Skin is not jaundiced or pale.  Neurological:     General: No focal deficit present.     Mental Status: He is alert and oriented to person, place, and time.     Cranial Nerves: No cranial nerve deficit.     Sensory: No sensory deficit.     Motor: No weakness.     Coordination: Coordination normal.  Psychiatric:        Mood and Affect: Mood normal.        Behavior: Behavior normal.     ED Results / Procedures / Treatments   Labs (all labs ordered are listed, but only abnormal results are displayed) Labs Reviewed  BASIC METABOLIC PANEL WITH GFR - Abnormal; Notable for the following components:      Result Value   CO2 21 (*)    Glucose, Bld 107 (*)    All other components within normal limits  CBG MONITORING, ED - Abnormal; Notable for the following components:   Glucose-Capillary 106 (*)    All other components within normal limits  CBC WITH DIFFERENTIAL/PLATELET  MAGNESIUM   TROPONIN I (HIGH SENSITIVITY)  TROPONIN I (HIGH SENSITIVITY)    EKG None  Radiology No results found.  Procedures Procedures    Medications Ordered in ED Medications  lactated ringers  bolus 1,000 mL (1,000 mLs Intravenous New Bag/Given 05/10/24 2001)    ED Course/ Medical Decision Making/ A&P                                 Medical Decision Making Amount and/or Complexity of Data Reviewed Labs: ordered.   This patient presents to the ED for concern of near syncope, this involves an extensive number of treatment options, and is a complaint that carries with it a high risk of complications and morbidity.  The differential diagnosis includes dehydration, anemia,  metabolic derangements, arrhythmia, polypharmacy   Co morbidities / Chronic conditions that complicate the patient evaluation  DM, migraines, GERD, CAD, HLD, atrial flutter, CVA   Additional history obtained:  Additional history obtained from EMR External records from outside source obtained and reviewed including N/A   Lab Tests:  I Ordered, and personally interpreted labs.  The pertinent results include: Normal hemoglobin, no leukocytosis, normal kidney function,  normal electrolytes, normal troponin   Cardiac Monitoring: / EKG:  The patient was maintained on a cardiac monitor.  I personally viewed and interpreted the cardiac monitored which showed an underlying rhythm of: Sinus rhythm   Problem List / ED Course / Critical interventions / Medication management  Patient presenting for dizziness and left-sided chest pain.  Onset was 3 AM.  Vital signs on arrival are normal.  Prior to being bedded in the ED, workup was initiated.  Lab work shows normal hemoglobin, no leukocytosis, normal kidney function, normal electrolytes, and normal troponin.  On assessment, patient is well-appearing.  He is asymptomatic at rest.  When going from supine to standing, he does develop lightheadedness and dizziness.  Orthostatic blood pressures were checked.  When in recumbent position, patient's blood pressure was 145/87.  When standing, blood pressure dropped to 129/92.  I suspect dehydration secondary to his gastroenteritis over the weekend.  IV fluids were ordered.  Patient second troponin was reassuring.  On reassessment, patient's symptoms have improved.  He no longer appears near syncopal with standing.  He was able to walk to the restroom without any difficulty.  He was advised to continue hydration at home.  He was discharged in stable condition. I ordered medication including IV fluids Reevaluation of the patient after these medicines showed that the patient hydration I have reviewed the patients  home medicines and have made adjustments as needed  Social Determinants of Health:  Lives independently        Final Clinical Impression(s) / ED Diagnoses Final diagnoses:  Near syncope  Dehydration    Rx / DC Orders ED Discharge Orders     None         Iva Mariner, MD 05/10/24 2153

## 2024-05-10 NOTE — ED Triage Notes (Signed)
 Pt c/o dizziness since 0300 this am; states over the weekend having nausea, vomiting, diarrhea; stared feeling better Sunday; dizziness with worse positional changes; endorses some L sided cp; hx copd, states breathing baseline

## 2024-05-10 NOTE — Discharge Instructions (Signed)
 Your test results today are reassuring.  Continue to drink plenty of fluids at home to stay hydrated.  Return to the emergency department for any new or worsening symptoms of concern.

## 2024-05-10 NOTE — ED Provider Triage Note (Signed)
 Emergency Medicine Provider Triage Evaluation Note  AZAD CALAME , a 63 y.o. male  was evaluated in triage.  Pt complains of dizziness that started 3 AM while at work.  States feeling is going to fall down, it is worse when he stands up from sitting down, better with lying flat.  He states he has some intermittent left-sided chest pain, he states he gets this from time to time and calls it "phantom heartburn".  States he has an extensive cardiac history and wanted to get checked out.  Review of Systems  Positive: Chest pain, dizziness Negative: Syncope, shortness of breath  Physical Exam  BP (!) 149/95 (BP Location: Right Arm)   Pulse 85   Temp 98.5 F (36.9 C)   Resp 18   SpO2 100%  Gen:   Awake, no distress   Resp:  Normal effort  MSK:   Moves extremities without difficulty  Other:    Medical Decision Making  Medically screening exam initiated at 5:33 PM.  Appropriate orders placed.  Janie Meier was informed that the remainder of the evaluation will be completed by another provider, this initial triage assessment does not replace that evaluation, and the importance of remaining in the ED until their evaluation is complete.     Aimee Houseman, PA-C 05/10/24 1734

## 2024-05-12 ENCOUNTER — Other Ambulatory Visit: Payer: Self-pay | Admitting: Adult Health

## 2024-05-15 NOTE — Progress Notes (Unsigned)
 Cardiology Office Note    Date:  05/20/2024  ID:  ANOTHONY BURSCH, DOB [birth year], MRN [id number] PCP:  Patient, No Pcp Per  Cardiologist:  Luana Rumple, MD  Electrophysiologist:  Efraim Grange, MD   Chief Complaint: Follow up for CAD   History of Present Illness: .    Jerry Shaffer is a 63 y.o. male with visit-pertinent history of coronary artery disease presenting with inferior NSTEMI in 2021 due to occlusion of the mid PDA emergently underwent PCI/DES stents 18 x 2.75 mm Onyx, subsequently presented with unstable angina and underwent PCI with DES 3.5 x 20 mm Synergy to the proximal LAD in 2022, subsequently had worsening exertional angina and PET scan showed inferior lateral ischemia so he underwent DES to a severe stenosis in the OM 2 and 11/2023 with Synergy XD 2.50 x 38 mm.  Patient also with history of aortic atherosclerosis, migraine headaches, dyslipidemia, type 2 diabetes mellitus, adult ADHD, chronic smoker.  In September 2023 he underwent successful ablation for atypical atrial flutter by Dr. Arlester Ladd, he has not had any sustained arrhythmia occurrence since then.  Patient was last seen in clinic on 02/26/2024 by Dr. Alvis Ba.  Patient reported having significant improvement in his exertional chest pain after placement of the OM2 stent however continued to have some exertional angina.  He was started on Ranexa  500 mg twice daily with improvement.  He is previously on isosorbide  mononitrate which had improvement in chest pain but caused significant headaches.  Patient noted some exertional chest pain at visit however denied taking any nitroglycerin , he reported symptoms always resolved with rest.  It was noted that patient's biggest complaint was severe heartburn since he has started treatment with Ozempic  3 months prior.  Patient's ranolazine  was increased to maximum dose.  On 03/25/24 patient was brought to [facility] via EMS for sharp chest pain, reportedly patient took  sublingual nitroglycerin  x 3 with some mild improvement in pain.  While in the ED he received Nitropaste which he reported reduce the severity of his pain.  Patient described the pain as a sharp stabbing pain over his left chest, the day prior he reported it radiated up to the jaw.  It woke patient from sleep and was severe, felt similar to his angina requiring his most recent stenting.  Echocardiogram 03/25/2024 indicated LVEF of 55 to 60%, no RWMA, mild concentric LVH, diastolic parameters were normal, RV systolic function and size was normal, no evidence of mitral valve regurgitation or stenosis, aortic valve regurgitation was not visualized no evidence of stenosis.  Trponins negative x2. Patient underwent LHC on 03/25/2024, noted to have stable three-vessel disease with widely patent stents in the LAD, OM1 and PDA with mild to moderate disease elsewhere with most notable non-stented lesion a 55% mid LAD lesion which was stable.  Patient was discharged on 03/25/24 in stable condition.  On 05/10/2024 patient presented to the ED with complaints of dizziness, he reported that he felt he was going to fall down and said it was worse when standing up from sitting down and was improved when lying flat.  Lab work indicated hemoglobin of 14.7, hematocrit 44.7, potassium 4.2, sodium 139, creatinine 0.85, troponin 7>7.  Patient reported that the prior weekend he had symptoms of nausea, vomiting and diarrhea that had resolved.  Orthostatic blood pressures were checked, when in recumbent positions patient's blood pressure was 145/87, when standing blood pressure dropped to 129/92.  It was felt that patient was likely dehydrated  secondary to gastroenteritis the prior weekend.  He was given 1 L of lactated Ringer 's, on reevaluation patient symptoms have resolved.  Today presents for follow-up.  He reports that he has been doing well since he was discharged from the hospital last week.  He reports that his GI complaints are  improving, denies any further presyncope, denies any syncope.  He notes that if he moves too fast he might have a second of dizziness that quickly resolves.  He notes that he is slightly fatigued related to his GI illness however this is also improving.  He denies any chest pain, denies any new shortness of breath, notes that he does have intermittent problems with asthma, denies any significant changes.  Patient reports that his heartburn has significantly improved since starting on Protonix .  Denies any further chest discomfort with previously sent to the hospital in April.  He feels this may have been related to Ozempic  use and worsening heartburn at that time, is looking forward to coming off Ozempic  in the next few weeks.  Patient reports that he really enjoys going fishing and going up to [employer] for [employer].    Labwork independently reviewed: 05/10/2024: Sodium 139, potassium 4.2, creatinine 0.85, hemoglobin 14.7, hematocrit 44.7 ROS: .   Today he denies chest pain, shortness of breath, lower extremity edema, fatigue, palpitations, melena, hematuria, hemoptysis, diaphoresis, weakness, presyncope, syncope, orthopnea, and PND.  All other systems are reviewed and otherwise negative. Studies Reviewed: Aaron Aas    EKG:  EKG is not ordered today.  CV Studies: Cardiac studies reviewed are outlined and summarized above. Otherwise please see EMR for full report. Cardiac Studies & Procedures   ______________________________________________________________________________________________ CARDIAC CATHETERIZATION  CARDIAC CATHETERIZATION 03/25/2024  Conclusion Images from the original result were not included.    Previously placed Prox LAD to Mid LAD stent of unknown type is  widely patent.  Mid LAD lesion is 55% stenosed.   2nd Diag lesion is 35% stenosed.   Recently placed 2nd Mrg stent of unknown type is widely patent.   Prox RCA to Mid RCA lesion is 30% stenosed.  RPAV lesion is 50% stenosed.    RPDA-1 lesion is 40% stenosed.  Previously placed RPDA-2 stent of unknown type is widely patent.   LV end diastolic pressure is normal.   There is no aortic valve stenosis.  Dominance: Right  Stable three-vessel disease with widely patent stents in the LAD, OM1 and PDA with mild to moderate disease elsewhere.  Most notable nonstented lesion is a 55 % mid LAD lesion which is stable just after the 2nd Diagl branch. Normal LVEDP with normal EF by Echo   RECOMMENDATIONS   In the absence of any other complications or medical issues, we expect the patient to be ready for discharge from a cath perspective on 03/25/2024.   Continue antiplatelet regimen per prior PCI    Randene Bustard, MD  Findings Coronary Findings Diagnostic  Dominance: Right  Left Main Vessel was injected. Vessel is normal in caliber and large. There is mild diffuse disease throughout the vessel.  Left Anterior Descending There is mild diffuse disease throughout the vessel. Previously placed Prox LAD to Mid LAD stent of unknown type is  widely patent. Mid LAD lesion is 55% stenosed.  First Diagonal Branch Vessel is small in size.  Second Diagonal Branch Vessel is small in size. 2nd Diag lesion is 35% stenosed.  Third Diagonal Branch Vessel is small in size.  Left Circumflex There is mild diffuse disease throughout  the vessel.  First Obtuse Marginal Branch Vessel is small in size.  Second Obtuse Marginal Branch Vessel is large in size. Previously placed 2nd Mrg stent of unknown type is  widely patent.  Right Coronary Artery Vessel was injected. There is mild diffuse disease throughout the vessel. There is mild focal disease in the vessel. Prox RCA to Mid RCA lesion is 30% stenosed.  Right Posterior Descending Artery Collaterals RPDA filled by collaterals from Dist LAD.  RPDA-1 lesion is 40% stenosed. Previously placed RPDA-2 stent of unknown type is  widely patent.  Right Posterior Atrioventricular  Artery RPAV lesion is 50% stenosed.  Intervention  No interventions have been documented.   CARDIAC CATHETERIZATION  CARDIAC CATHETERIZATION 11/20/2023  Conclusion 1.  Patent left main with no significant stenosis 2.  Patent LAD with a widely patent proximal LAD stent site and no significant restenosis.  The mid LAD bifurcation disease is unchanged with nonobstructive plaquing involving a 35% stenosis at the ostium of the diagonal and a 50% stenosis in the LAD 3.  Progressive and severe stenosis of the second OM branch of the left circumflex, reduced from 99 to 0% with a 2.5 x 38 mm Synergy DES 4.  Large, dominant RCA with mild nonobstructive plaquing and patent stent in the PDA unchanged from the previous study 5.  Normal LVEDP  Recommendations: DAPT with aspirin  and clopidogrel  x 12 months, aggressive risk reduction measures.  Okay for hospital discharge tomorrow morning as long as no complications arise.  Findings Coronary Findings Diagnostic  Dominance: Right  Left Main There is mild diffuse disease throughout the vessel.  Left Anterior Descending There is mild diffuse disease throughout the vessel. Non-stenotic Prox LAD to Mid LAD lesion was previously treated. The lesion is eccentric. The lesion is mildly calcified. This lesion shows marked change from the prior cardiac catheterization 6 months ago, now clearly a very severe stenosis Mid LAD lesion is 50% stenosed.  Second Diagonal Branch 2nd Diag lesion is 35% stenosed.  Left Circumflex There is mild diffuse disease throughout the vessel.  First Obtuse Marginal Branch Vessel is small in size.  Second Obtuse Marginal Branch Vessel is large in size. 2nd Mrg lesion is 99% stenosed.  Right Coronary Artery There is mild diffuse disease throughout the vessel. Prox RCA to Mid RCA lesion is 30% stenosed.  Right Posterior Descending Artery Collaterals RPDA filled by collaterals from Dist LAD.  RPDA-1 lesion is 40%  stenosed. Non-stenotic RPDA-2 lesion was previously treated. The previously implanted stent is widely patent with no significant in-stent restenosis  Right Posterior Atrioventricular Artery RPAV lesion is 50% stenosed.  Intervention  2nd Mrg lesion Stent A drug-eluting stent was successfully placed using a SYNERGY XD 2.50X38. Post-stent angioplasty was performed using a BALL SAPPHIRE NC24 2.75X22. Maximum pressure:  16 atm. Post-Intervention Lesion Assessment The intervention was successful. Pre-interventional TIMI flow is 3. Post-intervention TIMI flow is 3. No complications occurred at this lesion. There is a 0% residual stenosis post intervention.   STRESS TESTS  NM PET CT CARDIAC PERFUSION MULTI W/ABSOLUTE BLOODFLOW 11/03/2023  Narrative   Findings are consistent with ischemia in the distribution of a distal oblique marginal branch of the left circumflex coronary artery or a posterolateral ventricular artery branch of the right coronary artery. The study is intermediate risk.   LV perfusion is abnormal. There is evidence of ischemia. There is no evidence of infarction. Defect 1: There is a medium defect with moderate reduction in uptake present in the mid to  basal inferolateral location(s) that is reversible. There is normal wall motion in the defect area. Consistent with ischemia. The defect is consistent with abnormal perfusion in the LCx or RCA territories.   Rest left ventricular function is normal. Rest EF: 51%. Stress left ventricular function is normal. Stress EF: 58%. End diastolic cavity size is normal. End systolic cavity size is normal.   Myocardial blood flow was computed to be 0.62ml/g/min at rest and 1.40ml/g/min at stress. Global myocardial blood flow reserve was 2.26 and was normal. MBF reserve was reduced in the left circumflex artery territory (1.71), particularly reduced in the area of the reversible perfusion defect (1.24)   Coronary calcium  assessment not performed  due to prior revascularization.   Electronically signed by Luana Rumple, MD.  EXAM: OVER-READ INTERPRETATION  PET-CT CHEST  The following report is an over-read performed by radiologist Dr. Kasandra Pain [facility] on 11/04/2023. This over-read does not include interpretation of cardiac or coronary anatomy or pathology. The cardiac PET and cardiac CT interpretation by the cardiologist is to be attached.  COMPARISON:  None.  FINDINGS: No evidence for lymphadenopathy within the visualized mediastinum or hilar regions.  10 mm ground-glass opacity identified right lower lobe on image 21/4.  Visualized portions of the upper abdomen are unremarkable.  No suspicious lytic or sclerotic osseous abnormality.  IMPRESSION: 10 mm ground-glass opacity right lower lobe. Initial follow-up with CT at 6-12 months is recommended to confirm persistence. If persistent, repeat CT is recommended every 2 years until 5 years of stability has been established. This recommendation follows the consensus statement: Guidelines for Management of Incidental Pulmonary Nodules Detected on CT Images: From the Fleischner Society 2017; Radiology 2017; 284:228-243.   Electronically Signed By: Donnal Fusi M.D. On: 11/04/2023 09:09   ECHOCARDIOGRAM  ECHOCARDIOGRAM COMPLETE 03/25/2024  Narrative ECHOCARDIOGRAM REPORT    Patient Name:   Janie Meier Date of Exam: 03/25/2024 Medical Rec #:  [id number]       Height:       68.0 in Accession #:    [id number]      Weight:       238.8 lb Date of Birth:  18-Sep-1961      BSA:          2.204 m Patient Age:    62 years        BP:           173/95 mmHg Patient Gender: M               HR:           73 bpm. Exam Location:  Inpatient  Procedure: 2D Echo, Cardiac Doppler, Color Doppler and Intracardiac Opacification Agent (Both Spectral and Color Flow Doppler were utilized during procedure).  Indications:    Chest Pain  History:         Patient has prior history of Echocardiogram examinations. NSTEMI, CAD, Stroke and TIA, Signs/Symptoms:Chest Pain; Risk Factors:Former Smoker, Diabetes and HLD.  Sonographer:    Willey Harrier Referring Phys: [id number] RONDELL A SMITH  IMPRESSIONS   1. Left ventricular ejection fraction, by estimation, is 55 to 60%. The left ventricle has normal function. The left ventricle has no regional wall motion abnormalities. There is mild concentric left ventricular hypertrophy. Left ventricular diastolic parameters were normal. 2. Right ventricular systolic function is normal. The right ventricular size is normal. Tricuspid regurgitation signal is inadequate for assessing PA pressure. 3. The mitral valve is normal in structure. No evidence of  mitral valve regurgitation. No evidence of mitral stenosis. 4. The aortic valve is normal in structure. Aortic valve regurgitation is not visualized. No aortic stenosis is present. 5. The inferior vena cava is normal in size with greater than 50% respiratory variability, suggesting right atrial pressure of 3 mmHg.  FINDINGS Left Ventricle: Left ventricular ejection fraction, by estimation, is 55 to 60%. The left ventricle has normal function. The left ventricle has no regional wall motion abnormalities. Definity  contrast agent was given IV to delineate the left ventricular endocardial borders. The left ventricular internal cavity size was normal in size. There is mild concentric left ventricular hypertrophy. Left ventricular diastolic parameters were normal.  Right Ventricle: The right ventricular size is normal. No increase in right ventricular wall thickness. Right ventricular systolic function is normal. Tricuspid regurgitation signal is inadequate for assessing PA pressure.  Left Atrium: Left atrial size was normal in size.  Right Atrium: Right atrial size was normal in size. Prominent Chiari network.  Pericardium: There is no evidence of pericardial  effusion.  Mitral Valve: The mitral valve is normal in structure. No evidence of mitral valve regurgitation. No evidence of mitral valve stenosis. MV peak gradient, 3.6 mmHg. The mean mitral valve gradient is 2.0 mmHg.  Tricuspid Valve: The tricuspid valve is normal in structure. Tricuspid valve regurgitation is trivial. No evidence of tricuspid stenosis.  Aortic Valve: The aortic valve is normal in structure. Aortic valve regurgitation is not visualized. No aortic stenosis is present. Aortic valve peak gradient measures 6.4 mmHg.  Pulmonic Valve: The pulmonic valve was normal in structure. Pulmonic valve regurgitation is not visualized. No evidence of pulmonic stenosis.  Aorta: The aortic root is normal in size and structure.  Venous: The inferior vena cava is normal in size with greater than 50% respiratory variability, suggesting right atrial pressure of 3 mmHg.  IAS/Shunts: No atrial level shunt detected by color flow Doppler.   LEFT VENTRICLE PLAX 2D LVIDd:         4.30 cm   Diastology LVIDs:         3.00 cm   LV e' medial:    5.22 cm/s LV PW:         1.20 cm   LV E/e' medial:  14.3 LV IVS:        1.40 cm   LV e' lateral:   8.59 cm/s LVOT diam:     2.00 cm   LV E/e' lateral: 8.7 LV SV:         79 LV SV Index:   36 LVOT Area:     3.14 cm   RIGHT VENTRICLE             IVC RV Basal diam:  3.60 cm     IVC diam: 2.20 cm RV S prime:     13.80 cm/s TAPSE (M-mode): 2.0 cm  LEFT ATRIUM             Index        RIGHT ATRIUM           Index LA Vol (A2C):   69.3 ml 31.45 ml/m  RA Area:     17.40 cm LA Vol (A4C):   57.3 ml 26.00 ml/m  RA Volume:   45.80 ml  20.78 ml/m LA Biplane Vol: 64.6 ml 29.32 ml/m AORTIC VALVE AV Area (Vmax): 2.74 cm AV Vmax:        126.00 cm/s AV Peak Grad:   6.4 mmHg LVOT Vmax:  110.00 cm/s LVOT Vmean:     72.300 cm/s LVOT VTI:       0.251 m  AORTA Ao Root diam: 3.60 cm Ao Asc diam:  3.80 cm  MITRAL VALVE MV Area (PHT): 2.56 cm     SHUNTS MV Area VTI:   3.35 cm    Systemic VTI:  0.25 m MV Peak grad:  3.6 mmHg    Systemic Diam: 2.00 cm MV Mean grad:  2.0 mmHg MV Vmax:       0.95 m/s MV Vmean:      68.2 cm/s MV Decel Time: 296 msec MV E velocity: 74.90 cm/s MV A velocity: 81.80 cm/s MV E/A ratio:  0.92  Arta Lark Electronically signed by Arta Lark Signature Date/Time: 03/25/2024/9:14:02 AM    Final          ______________________________________________________________________________________________       Current Reported Medications:.    Current Meds  Medication Sig   albuterol  (VENTOLIN  HFA) 108 (90 Base) MCG/ACT inhaler Inhale 1-2 puffs into the lungs every 6 (six) hours as needed for wheezing or shortness of breath.   aspirin  EC 81 MG tablet Take 1 tablet (81 mg total) by mouth daily. Swallow whole.   atorvastatin  (LIPITOR ) 80 MG tablet TAKE 1 TABLET BY MOUTH EVERY DAY   clopidogrel  (PLAVIX ) 75 MG tablet TAKE 1 TABLET(75 MG) BY MOUTH DAILY WITH BREAKFAST   glipiZIDE  (GLUCOTROL ) 5 MG tablet Take 1 tablet (5 mg total) by mouth 2 (two) times daily before a meal.   metFORMIN  (GLUCOPHAGE -XR) 500 MG 24 hr tablet Take 4 tablets (2,000 mg total) by mouth daily.   methocarbamol  (ROBAXIN ) 500 MG tablet Take 1 tablet (500 mg total) by mouth every 8 (eight) hours as needed for muscle spasms.   metoprolol  tartrate (LOPRESSOR ) 100 MG tablet Take 1 tablet (100 mg total) by mouth 2 (two) times daily.   nitroGLYCERIN  (NITROSTAT ) 0.4 MG SL tablet Place 1 tablet (0.4 mg total) under the tongue every 5 (five) minutes as needed for chest pain.   OVER THE COUNTER MEDICATION Take 1-2 packets by mouth daily. Goody's Extra Strength Headache Powder   pantoprazole  (PROTONIX ) 40 MG tablet Take 1 tablet (40 mg total) by mouth daily.   ranolazine  (RANEXA ) 500 MG 12 hr tablet Take 2 tablets (1,000 mg total) by mouth 2 (two) times daily.    Physical Exam:    VS:  BP 124/72   Pulse 80   Ht 5\' 8"  (1.727 m)   Wt  242 lb 3.2 oz (109.9 kg)   SpO2 98%   BMI 36.83 kg/m    Wt Readings from Last 3 Encounters:  05/20/24 242 lb 3.2 oz (109.9 kg)  03/25/24 242 lb 12.8 oz (110.1 kg)  03/03/24 238 lb 12.8 oz (108.3 kg)    GEN: Well nourished, well developed in no acute distress NECK: No JVD; No carotid bruits CARDIAC: RRR, no murmurs, rubs, gallops, right radial cath site clean and intact without evidence of hematoma RESPIRATORY:  Clear to auscultation without rales, wheezing or rhonchi  ABDOMEN: Soft, non-tender, non-distended EXTREMITIES:  No edema; No acute deformity     Asessement and Plan:.    CAD: Patient with inferior NSTEMI in 09/2020 s/p DES to M PDA, DES to pLAD in 07/2021 and OM 2 in 11/2023. Patient underwent LHC on 03/25/2024, noted to have stable three-vessel disease with widely patent stents in the LAD, OM1 and PDA with mild to moderate disease elsewhere with most notable nonscented lesion a 55%  mid LAD lesion which was stable. Today he reports that he has not had any further chest discomfort, reports that he is doing well.  Right radial cath site is clean and intact without evidence of hematoma.  Reviewed ED precautions. Patient has previously been unable to tolerate long-acting nitrates.  Continue aspirin  81 mg daily, Lipitor  80 mg daily, Plavix  75 mg daily, Lopressor  100 mg twice daily, Ranexa  1000 mg twice daily and sublingual nitroglycerin  as needed.  Presyncope: Patient presented to the ED on 05/10/2024 with increased dizziness and near syncope while at work going from sitting to standing.  Patient was orthostatic and treated with IV fluids with resolution of symptoms.  Today he reports that he is feeling significantly better, denies any further presyncope, denies any syncope.  Patient notes that he has some very slight dizziness that resolves after 5 seconds when he turns his head very fast, has been improving with further hydration.  Notes that his GI problems have been improving however  continues to note some slight fatigue but has been improving.  He will follow-up with PCP if symptoms worsen or persist.  DM: Last hemoglobin A1c 6/4 on 03/24/24.  Patient to follow-up with PCP.  Hyperlipidemia: Last lipid profile on 03/24/24 indicated total cholesterol 91, HDL 29, triglycerides 74 and LDL 47.Continue atorvastatin  80 mg daily and continue to work towards weight loss.  Atrial flutter: S/p ablation in 2023.  Denies any recurrence in more than a year.  He is no longer on anticoagulation.  Pulmonary nodules: Cardiac PET CT in 2024 showed groundglass nodule within the superior segment of the right lower lobe, resolved on imaging in 03/2024.  However there were 2 small nodules within the left apex measuring 3 mm which were new compared to 2023.  Recommended to have repeat chest CT in 1 year for monitoring.   Disposition: F/u with Dr. Alvis Ba in 11/2024 as planned.   Signed, Rosalin Buster D Jedediah Noda, NP

## 2024-05-20 ENCOUNTER — Encounter: Payer: Self-pay | Admitting: Cardiology

## 2024-05-20 ENCOUNTER — Ambulatory Visit: Payer: Self-pay | Attending: Cardiology | Admitting: Cardiology

## 2024-05-20 VITALS — BP 124/72 | HR 80 | Ht 68.0 in | Wt 242.2 lb

## 2024-05-20 DIAGNOSIS — R911 Solitary pulmonary nodule: Secondary | ICD-10-CM

## 2024-05-20 DIAGNOSIS — R55 Syncope and collapse: Secondary | ICD-10-CM

## 2024-05-20 DIAGNOSIS — Z8679 Personal history of other diseases of the circulatory system: Secondary | ICD-10-CM

## 2024-05-20 DIAGNOSIS — E119 Type 2 diabetes mellitus without complications: Secondary | ICD-10-CM | POA: Diagnosis not present

## 2024-05-20 DIAGNOSIS — I25118 Atherosclerotic heart disease of native coronary artery with other forms of angina pectoris: Secondary | ICD-10-CM | POA: Diagnosis not present

## 2024-05-20 DIAGNOSIS — R42 Dizziness and giddiness: Secondary | ICD-10-CM | POA: Diagnosis not present

## 2024-05-20 DIAGNOSIS — E7800 Pure hypercholesterolemia, unspecified: Secondary | ICD-10-CM

## 2024-05-20 NOTE — Patient Instructions (Signed)
 Medication Instructions:  No changes *If you need a refill on your cardiac medications before your next appointment, please call your pharmacy*  Lab Work: No labs  Testing/Procedures: No testing  Follow-Up: At [facility], you and your health needs are our priority.  As part of our continuing mission to provide you with exceptional heart care, our providers are all part of one team.  This team includes your primary Cardiologist (physician) and Advanced Practice Providers or APPs (Physician Assistants and Nurse Practitioners) who all work together to provide you with the care you need, when you need it.  Your next appointment:   6 month(s)  Provider:   Luana Rumple, MD    We recommend signing up for the patient portal called "MyChart".  Sign up information is provided on this After Visit Summary.  MyChart is used to connect with patients for Virtual Visits (Telemedicine).  Patients are able to view lab/test results, encounter notes, upcoming appointments, etc.  Non-urgent messages can be sent to your provider as well.   To learn more about what you can do with MyChart, go to [email or web address].

## 2024-05-23 ENCOUNTER — Telehealth: Payer: Self-pay

## 2024-05-23 MED ORDER — TRULICITY 0.75 MG/0.5ML ~~LOC~~ SOAJ: 0.7500 mg | SUBCUTANEOUS | 0 refills | Status: DC

## 2024-05-23 NOTE — Telephone Encounter (Signed)
 Pt walked into the office today stating he has new insurance and would like to be placed back on Trulicty and Tresiba . He believes with this new insurance he would be able to afford it.

## 2024-05-23 NOTE — Telephone Encounter (Signed)
 J, His most recent HbA1c was very good, at 6.4%, improved.  Based on this, he does not appear to need insulin , but we can switch from Ozempic , which he was not able to obtain, to Trulicity  starting at 0.75 mg weekly (we can send 1 box of 4 pens) and I will see him back in July, we can increase the dose if he does well with this. Ty! C

## 2024-05-23 NOTE — Telephone Encounter (Signed)
 Called and left a detailed vm and rx for trulicity  has been sent.    Renee Carrow

## 2024-07-04 ENCOUNTER — Encounter: Payer: Self-pay | Admitting: Internal Medicine

## 2024-07-04 ENCOUNTER — Ambulatory Visit (INDEPENDENT_AMBULATORY_CARE_PROVIDER_SITE_OTHER): Payer: Self-pay | Admitting: Internal Medicine

## 2024-07-04 VITALS — BP 124/80 | HR 60 | Ht 68.0 in | Wt 243.8 lb

## 2024-07-04 DIAGNOSIS — E785 Hyperlipidemia, unspecified: Secondary | ICD-10-CM

## 2024-07-04 DIAGNOSIS — E1165 Type 2 diabetes mellitus with hyperglycemia: Secondary | ICD-10-CM

## 2024-07-04 DIAGNOSIS — E66812 Obesity, class 2: Secondary | ICD-10-CM

## 2024-07-04 DIAGNOSIS — E1159 Type 2 diabetes mellitus with other circulatory complications: Secondary | ICD-10-CM

## 2024-07-04 DIAGNOSIS — Z7985 Long-term (current) use of injectable non-insulin antidiabetic drugs: Secondary | ICD-10-CM

## 2024-07-04 DIAGNOSIS — Z7984 Long term (current) use of oral hypoglycemic drugs: Secondary | ICD-10-CM

## 2024-07-04 LAB — POCT GLYCOSYLATED HEMOGLOBIN (HGB A1C): Hemoglobin A1C: 7.3 % — AB (ref 4.0–5.6)

## 2024-07-04 MED ORDER — TRULICITY 0.75 MG/0.5ML ~~LOC~~ SOAJ: 0.7500 mg | SUBCUTANEOUS | 3 refills | Status: DC

## 2024-07-04 NOTE — Addendum Note (Signed)
 Addended by: CLEOTILDE ROLIN RAMAN on: 07/04/2024 09:29 AM   Modules accepted: Orders

## 2024-07-04 NOTE — Patient Instructions (Addendum)
 Please continue: - Metformin  ER 1000 mg 2x daily - Glipizide  5 mg 2x a day but move this 30 min before meals  Try to start back: - Trulicity  0.75 mg weekly    Please return in 4 months.

## 2024-07-04 NOTE — Progress Notes (Signed)
 Patient ID: Jerry Shaffer, male   DOB: 12-01-1961, 63 y.o.   MRN: [id number]  HPI: Jerry Shaffer is a 62 y.o.-year-old male, returning for follow-up for DM2, dx in 2013, non insulin -dependent (on insulin  briefly in 2016, and then again in 2021, but off now), uncontrolled, with complications (CAD - s/p NSTEMI, TIA, PN). Pt. previously saw Dr. Kassie, but last visit with me 4 months ago. Insurance: Community education officer >> now [employer] but w/o Rx benefits  Interim history: No increased urination, but no blurry vision, nausea.  However, he has significant acid reflux - on Tums and Protonix . Since last visit, he was the emergency room with near syncope/dehydration 05/10/2024 - gastroenteritis.  He was previously admitted with chest pain 03/24/2024 - had a stent placed, the third. He is now feeling better. He does not have a job, but will start [employer] for Rx coverage in 10 days. He lost his meter - is not checking sugars.  Reviewed HbA1c: Lab Results  Component Value Date   HGBA1C 6.4 (H) 03/24/2024   HGBA1C 7.0 (A) 03/03/2024   HGBA1C 8.5 (A) 10/27/2023   HGBA1C 7.6 (H) 08/21/2023   HGBA1C 6.9 06/23/2023   HGBA1C 7.5 (H) 01/08/2023   HGBA1C 8.3 (A) 11/04/2022   HGBA1C 8.6 (H) 09/06/2022   HGBA1C 9.0 (A) 08/26/2022   HGBA1C 10.0 (A) 04/18/2022   Pt is on a regimen of: - Metformin  ER 1000 mg 2x daily >> occas. Loose stools >> now IR 1000 mg 2x a day - Glipizide  5 mg before bedtime >> before breakfast >> after b'fast >> 5 mg 2x a day before meals - Ozempic  0.25 >> 0.5 >> off >> 0.25 mg weekly (samples) >> GI sxs (AP, D) >> would not want to start  >> DID not start Trulicity  0.75 mg weekly because not covered He has needle phobia with syringes.  He has no problems injecting insulin  with pens. He did not want to try Farxiga due to fear of side effects. He was on Trulicity  >> could not obtain it.  He was on Tresiba  >> not affordable.  Pt checks his sugars 2-3x a day and they are:  - am: going to bed:<160 >>  150-175 >> 130-200 >> 80-155 - 2h after b'fast: n/c - before lunch: n/c - 2h after lunch: n/c - before dinner: 97, 112-115 >> 180-190 >> 118-130 >> 100-150 - after dinner: 128-150 >> <160, 185 >> 200s >> n/c - bedtime: n/c - nighttime: n/c Lowest sugar was 22 (many years ago - on 10 mg Glipizide  then) >>...  97 >> 150 >> 79 >> 80; he has hypoglycemia awareness at 80.  Highest sugar was 999 (long time ago)  >> .SABRASABRA240 (ate late) >> 185 >> upper 200s (ramen noodles) >> almost 300 (rice).  Glucometer:?  - no CKD, last BUN/creatinine:  Lab Results  Component Value Date   BUN 15 05/10/2024   BUN 11 03/24/2024   CREATININE 0.85 05/10/2024   CREATININE 0.81 03/24/2024   No results found for: MICRALBCREAT He is not on ACE inhibitor/ARB.  - He has HL; last set of lipids: Lab Results  Component Value Date   CHOL 91 03/24/2024   HDL 29 (L) 03/24/2024   LDLCALC 47 03/24/2024   TRIG 74 03/24/2024   CHOLHDL 3.1 03/24/2024  On Lipitor  80 mg daily.  - last eye exam was several years ago. No DR reportedly.  He cannot afford an a new exam.  - + numbness and tingling in his feet.  Last foot exam 10/27/2023.  He also has a history of bipolar disease, emphysema, migraines. He was in the ED with A-fib with RVR 09/06/2022. He had to have an ablation.  ROS: + see HPI  Past Medical History:  Diagnosis Date   Atrial flutter (HCC)    Bipolar 1 disorder (HCC)    Coronary artery disease    Diabetes mellitus    Dyslipidemia    Emphysema    GERD (gastroesophageal reflux disease)    History of kidney stones    Hypertension    Mental disorder    BIPOLAR DISORDER   Migraine    NSTEMI (non-ST elevated myocardial infarction) (HCC) 09/20/2020   Pneumonia    PONV (postoperative nausea and vomiting)    Renal disorder    Shortness of breath    Stroke (HCC)    Transient ischemic attack (TIA)    Past Surgical History:  Procedure Laterality Date   A-FLUTTER ABLATION N/A 09/08/2022    Procedure: A-FLUTTER ABLATION;  Surgeon: Mealor, Eulas BRAVO, MD;  Location: [facility];  Service: Cardiovascular;  Laterality: N/A;   COLONOSCOPY WITH PROPOFOL  N/A 10/17/2021   Procedure: COLONOSCOPY WITH PROPOFOL ;  Surgeon: Teressa Toribio SQUIBB, MD;  Location: [facility];  Service: Endoscopy;  Laterality: N/A;   CORONARY ANGIOGRAPHY  09/20/2020   CORONARY STENT INTERVENTION  09/20/2020   CORONARY STENT INTERVENTION   CORONARY STENT INTERVENTION N/A 09/20/2020   Procedure: CORONARY STENT INTERVENTION;  Surgeon: Claudene Victory ORN, MD;  Location: [facility];  Service: Cardiovascular;  Laterality: N/A;   CORONARY STENT INTERVENTION N/A 03/28/2021   Procedure: CORONARY STENT INTERVENTION;  Surgeon: Wonda Sharper, MD;  Location: [facility] INVASIVE CV LAB;  Service: Cardiovascular;  Laterality: N/A;   CORONARY STENT INTERVENTION N/A 11/20/2023   Procedure: CORONARY STENT INTERVENTION;  Surgeon: Wonda Sharper, MD;  Location: [facility] INVASIVE CV LAB;  Service: Cardiovascular;  Laterality: N/A;   HEMORROIDECTOMY     KNEE ARTHROSCOPY     LEFT HEART CATH AND CORONARY ANGIOGRAPHY N/A 09/20/2020   Procedure: LEFT HEART CATH AND CORONARY ANGIOGRAPHY;  Surgeon: Claudene Victory ORN, MD;  Location: [facility];  Service: Cardiovascular;  Laterality: N/A;   LEFT HEART CATH AND CORONARY ANGIOGRAPHY N/A 03/28/2021   Procedure: LEFT HEART CATH AND CORONARY ANGIOGRAPHY;  Surgeon: Wonda Sharper, MD;  Location: [facility] INVASIVE CV LAB;  Service: Cardiovascular;  Laterality: N/A;   LEFT HEART CATH AND CORONARY ANGIOGRAPHY N/A 11/20/2023   Procedure: LEFT HEART CATH AND CORONARY ANGIOGRAPHY;  Surgeon: Wonda Sharper, MD;  Location: [facility] INVASIVE CV LAB;  Service: Cardiovascular;  Laterality: N/A;   LEFT HEART CATH AND CORONARY ANGIOGRAPHY N/A 03/25/2024   Procedure: LEFT HEART CATH AND CORONARY ANGIOGRAPHY;  Surgeon: Anner Alm ORN, MD;  Location: [facility] INVASIVE CV LAB;  Service: Cardiovascular;  Laterality: N/A;   LUMBAR DISC  SURGERY     POLYPECTOMY  10/17/2021   Procedure: POLYPECTOMY;  Surgeon: Teressa Toribio SQUIBB, MD;  Location: [facility];  Service: Endoscopy;;   SHOULDER ARTHROSCOPY WITH DISTAL CLAVICLE RESECTION Left 01/20/2023   Procedure: SHOULDER ARTHROSCOPY WITH DISTAL CLAVICLE EXCISION;  Surgeon: Josefina Chew, MD;  Location: [facility];  Service: Orthopedics;  Laterality: Left;   SHOULDER ARTHROSCOPY WITH ROTATOR CUFF REPAIR AND SUBACROMIAL DECOMPRESSION Left 01/20/2023   Procedure: SHOULDER ARTHROSCOPY WITH ROTATOR CUFF REPAIR AND SUBACROMIAL DECOMPRESSION;  Surgeon: Josefina Chew, MD;  Location: [facility];  Service: Orthopedics;  Laterality: Left;   TONSILLECTOMY AND ADENOIDECTOMY     Social History  Socioeconomic History   Marital status: Divorced    Spouse name: Not on file   Number of children: Not on file   Years of education: Not on file   Highest education level: Not on file  Occupational History   Occupation: Security guard    Employer: [employer]  Tobacco Use   Smoking status: Former    Types: E-cigarettes   Smokeless tobacco: Former    Quit date: 2022  Vaping Use   Vaping status: Former  Substance and Sexual Activity   Alcohol use: No   Drug use: Not Currently   Sexual activity: Not Currently  Other Topics Concern   Not on file  Social History Narrative   Not on file   Social Drivers of Health   Financial Resource Strain: Not on file  Food Insecurity: No Food Insecurity (03/25/2024)   Hunger Vital Sign    Worried About Running Out of Food in the Last Year: Never true    Ran Out of Food in the Last Year: Never true  Transportation Needs: No Transportation Needs (03/25/2024)   PRAPARE - Administrator, Civil Service (Medical): No    Lack of Transportation (Non-Medical): No  Physical Activity: Not on file  Stress: Not on file  Social Connections: Unknown (03/25/2024)   Social Connection and Isolation Panel    Frequency of Communication with Friends and Family: Not  on file    Frequency of Social Gatherings with Friends and Family: Not on file    Attends Religious Services: Not on file    Active Member of Clubs or Organizations: Not on file    Attends Banker Meetings: Not on file    Marital Status: Divorced  Intimate Partner Violence: Not At Risk (03/25/2024)   Humiliation, Afraid, Rape, and Kick questionnaire    Fear of Current or Ex-Partner: No    Emotionally Abused: No    Physically Abused: No    Sexually Abused: No   Current Outpatient Medications on File Prior to Visit  Medication Sig Dispense Refill   albuterol  (VENTOLIN  HFA) 108 (90 Base) MCG/ACT inhaler Inhale 1-2 puffs into the lungs every 6 (six) hours as needed for wheezing or shortness of breath. 18 g 6   aspirin  EC 81 MG tablet Take 1 tablet (81 mg total) by mouth daily. Swallow whole.     atorvastatin  (LIPITOR ) 80 MG tablet TAKE 1 TABLET BY MOUTH EVERY DAY 30 tablet 11   clopidogrel  (PLAVIX ) 75 MG tablet TAKE 1 TABLET(75 MG) BY MOUTH DAILY WITH BREAKFAST 90 tablet 3   Dulaglutide  (TRULICITY ) 0.75 MG/0.5ML SOAJ Inject 0.75 mg into the skin once a week. 2 mL 0   glipiZIDE  (GLUCOTROL ) 5 MG tablet Take 1 tablet (5 mg total) by mouth 2 (two) times daily before a meal. 180 tablet 3   metFORMIN  (GLUCOPHAGE -XR) 500 MG 24 hr tablet Take 4 tablets (2,000 mg total) by mouth daily. 360 tablet 3   methocarbamol  (ROBAXIN ) 500 MG tablet Take 1 tablet (500 mg total) by mouth every 8 (eight) hours as needed for muscle spasms. 30 tablet 0   metoprolol  tartrate (LOPRESSOR ) 100 MG tablet Take 1 tablet (100 mg total) by mouth 2 (two) times daily. 180 tablet 3   nitroGLYCERIN  (NITROSTAT ) 0.4 MG SL tablet Place 1 tablet (0.4 mg total) under the tongue every 5 (five) minutes as needed for chest pain. 25 tablet 5   OVER THE COUNTER MEDICATION Take 1-2 packets by mouth daily. Goody's Extra  Strength Headache Powder     pantoprazole  (PROTONIX ) 40 MG tablet Take 1 tablet (40 mg total) by mouth daily. 30  tablet 11   ranolazine  (RANEXA ) 500 MG 12 hr tablet Take 2 tablets (1,000 mg total) by mouth 2 (two) times daily. 90 tablet 3   No current facility-administered medications on file prior to visit.   Allergies  Allergen Reactions   Bee Venom Anaphylaxis   Penicillins Anaphylaxis    Did it involve swelling of the face/tongue/throat, SOB, or low BP? yes Did it involve sudden or severe rash/hives, skin peeling, or any reaction on the inside of your mouth or nose? unknown Did you need to seek medical attention at a hospital or doctor's office? yes When did it last happen?      1967 If all above answers are NO, may proceed with cephalosporin use.    Procaine Anaphylaxis   Isosorbide  Other (See Comments)    Caused migraines   Tape Itching and Other (See Comments)    Certain medical tapes make the patient's skin ITCH   Family History  Problem Relation Age of Onset   Diabetes type II Other    Coronary artery disease Other    Bipolar disorder Other    Lung cancer Father    CAD Mother 34   PE: BP 124/80   Pulse 60   Ht 5' 8 (1.727 m)   Wt 243 lb 12.8 oz (110.6 kg)   SpO2 99%   BMI 37.07 kg/m  Wt Readings from Last 10 Encounters:  07/04/24 243 lb 12.8 oz (110.6 kg)  05/20/24 242 lb 3.2 oz (109.9 kg)  03/25/24 242 lb 12.8 oz (110.1 kg)  03/03/24 238 lb 12.8 oz (108.3 kg)  02/26/24 237 lb 12.8 oz (107.9 kg)  12/08/23 246 lb 3.2 oz (111.7 kg)  11/20/23 241 lb 13.5 oz (109.7 kg)  11/20/23 244 lb (110.7 kg)  10/27/23 249 lb (112.9 kg)  08/21/23 242 lb 3.2 oz (109.9 kg)   Constitutional: overweight, in NAD Eyes: no exophthalmos ENT: no thyromegaly, no cervical lymphadenopathy Cardiovascular: RRR, No MRG Respiratory: CTA B Musculoskeletal: no deformities Skin:  no rashes Neurological: + mild tremor with outstretched hands Diabetic Foot Exam - Simple   Simple Foot Form Diabetic Foot exam was performed with the following findings: Yes 07/04/2024  9:04 AM  Visual  Inspection No deformities, no ulcerations, no other skin breakdown bilaterally: Yes Sensation Testing Intact to touch and monofilament testing bilaterally: Yes Pulse Check Posterior Tibialis and Dorsalis pulse intact bilaterally: Yes Comments Very thick indurated toenail on L hallux    ASSESSMENT: 1. DM2, noninsulin-dependent, but off insulin  now, uncontrolled, with complications - CAD, s/p NSTEMI (11/20/2023- last) - cerebro-vascular disease, s/p TIA - PN  2. HL  3.  Obesity class II  PLAN:  1. Patient with longstanding, uncontrolled, type 2 diabetes, on oral antidiabetic regimen with metformin  and sulfonylurea, and we tried to start him back on the GLP-1 receptor agonist since last visit but he could not obtain this.  He was on Ozempic  but had to come off due to price.  Also, we had to use a low-dose of Ozempic  for him due to GERD.  He mentions that he would not want to restart this due to also abdominal pain and diarrhea.  At last visit I suggested low-dose Trulicity , but he mentions that this needed a PA.  He will change his insurance to also include prescription coverage after the first of next month, in 10 days.  At our last visit, HbA1c was lower, at 7.0%, but since then he had another HbA1c which was even lower, at 6.4%, 3.5 months ago. - At today's visit, he is not checking sugars after he lost his meter approximately a week ago.  He is planning to get another one.  Sugars are at or close to goal whenever he checks, with only few mildly hyperglycemic spikes.  Upon questioning, however, he is taking glipizide  without relationship to the meals and I advised him to move this to 30 minutes before breakfast and dinner.  Will also try to start Trulicity  after he gets prescription coverage by Medicaid next month.  I gave him a written prescription for this. - I suggested to:  Patient Instructions  Please continue: - Metformin  ER 1000 mg 2x daily - Glipizide  5 mg 2x a day but move this 30  min before meals  Try to start back: - Trulicity  0.75 mg weekly    Please return in 4 months.  - we checked his HbA1c: 7.3% (higher) - advised to check sugars at different times of the day - 1x a day, rotating check times - advised for yearly eye exams >> he is UTD (not affordable) - will check a UACR today - return to clinic in 4 months  2. HL - Latest lipid panel was reviewed from 03/2024: Fractions at goal except for low HDL: Lab Results  Component Value Date   CHOL 91 03/24/2024   HDL 29 (L) 03/24/2024   LDLCALC 47 03/24/2024   TRIG 74 03/24/2024   CHOLHDL 3.1 03/24/2024  -He continues Lipitor  80 mg daily-no side effects  3.  Obesity class II -he lost 18 pounds after starting Ozempic  but then gained 11 pounds back after coming off due to lack of insurance coverage -We restarted Ozempic  and he was getting samples now he was able to again lose weight (11 pounds) on the GLP-1 receptor agonist.  However, he mentions that he had significant GI side effects with this and would not want to restart - Will try to start Trulicity  low-dose and increase the dose as tolerated.  Lela Fendt, MD PhD [facility]

## 2024-07-05 ENCOUNTER — Ambulatory Visit: Payer: Self-pay | Admitting: Internal Medicine

## 2024-07-05 LAB — MICROALBUMIN / CREATININE URINE RATIO
Creatinine, Urine: 65 mg/dL (ref 20–320)
Microalb Creat Ratio: 6 mg/g{creat} (ref <30)
Microalb, Ur: 0.4 mg/dL

## 2024-07-08 ENCOUNTER — Ambulatory Visit: Payer: Self-pay | Attending: Cardiovascular Disease | Admitting: Cardiovascular Disease

## 2024-07-08 ENCOUNTER — Encounter: Payer: Self-pay | Admitting: Cardiovascular Disease

## 2024-07-08 VITALS — BP 148/78 | HR 65 | Wt 242.2 lb

## 2024-07-08 DIAGNOSIS — E1159 Type 2 diabetes mellitus with other circulatory complications: Secondary | ICD-10-CM

## 2024-07-08 DIAGNOSIS — Z794 Long term (current) use of insulin: Secondary | ICD-10-CM

## 2024-07-08 DIAGNOSIS — R062 Wheezing: Secondary | ICD-10-CM

## 2024-07-08 DIAGNOSIS — I25119 Atherosclerotic heart disease of native coronary artery with unspecified angina pectoris: Secondary | ICD-10-CM

## 2024-07-08 DIAGNOSIS — I700 Atherosclerosis of aorta: Secondary | ICD-10-CM

## 2024-07-08 DIAGNOSIS — Z9889 Other specified postprocedural states: Secondary | ICD-10-CM

## 2024-07-08 DIAGNOSIS — Z8679 Personal history of other diseases of the circulatory system: Secondary | ICD-10-CM

## 2024-07-08 DIAGNOSIS — R0602 Shortness of breath: Secondary | ICD-10-CM

## 2024-07-08 DIAGNOSIS — E785 Hyperlipidemia, unspecified: Secondary | ICD-10-CM

## 2024-07-08 NOTE — Patient Instructions (Addendum)
 Medication Instructions:  Your physician recommends that you continue on your current medications as directed. Please refer to the Current Medication list given to you today.  *If you need a refill on your cardiac medications before your next appointment, please call your pharmacy*  Follow-Up: At [facility], you and your health needs are our priority.  As part of our continuing mission to provide you with exceptional heart care, our providers are all part of one team.  This team includes your primary Cardiologist (physician) and Advanced Practice Providers or APPs (Physician Assistants and Nurse Practitioners) who all work together to provide you with the care you need, when you need it.  Your next appointment:   6 month(s)  Provider:   One of our Advanced Practice Providers (APPs): Morse Clause, PA-C  Lamarr Satterfield, NP Miriam Shams, NP  Olivia Pavy, PA-C Josefa Beauvais, NP  Leontine Salen, PA-C Orren Fabry, PA-C  Jessie, PA-C Ernest Dick, NP  Damien Braver, NP Jon Hails, PA-C  Waddell Donath, PA-C    Dayna Dunn, PA-C  Scott Weaver, PA-C Lum Louis, NP Katlyn West, NP Callie Goodrich, PA-C  Evan Williams, PA-C Sheng Haley, PA-C  Xika Zhao, NP Kathleen Marvena Tally, PA-C   Then, Jerel Balding, MD will plan to see you again in 1 year(s).   We recommend signing up for the patient portal called MyChart.  Sign up information is provided on this After Visit Summary.  MyChart is used to connect with patients for Virtual Visits (Telemedicine).  Patients are able to view lab/test results, encounter notes, upcoming appointments, etc.  Non-urgent messages can be sent to your provider as well.   To learn more about what you can do with MyChart, go to [email or web address].   Other Instructions You have been referred to [facility]. Their office will call you to schedule an appointment with one of their providers.

## 2024-07-08 NOTE — Progress Notes (Signed)
 Cardiology Office Note:    Date:  07/10/2024   ID:  Jerry Shaffer, DOB 01/25/1961, MRN [id number]  PCP:  Patient, No Pcp Per  [facility] Cardiologist:  Jerel Balding, MD  [facility] Electrophysiologist:  Eulas FORBES Furbish, MD   Referring MD: No ref. provider found   No chief complaint on file.   History of Present Illness:    Jerry Shaffer is a 63 y.o. male with a hx of rcoronary artery disease presenting with inferior NSTEMI on September 20, 2020 due to occlusion of the mid PDA emergency PCI-drug-eluting stent (18 x 2.75 mm Onyx), subsequently presented with unstable angina and underwent PCI-drug-eluting stent (3.5 x 20 mm Synergy to the proximal LAD on March 28, 2021), subsequently had worsening exertional angina and PET scan showed inferolateral ischemia so he received a drug-eluting stent to a severe stenosis in the OM2(11/20/2023 Synergy XD 2.50 x 38 mm).  Repeat angiography for chest pain 03/25/2024 showed no new stenoses.  Also known to have aortic atherosclerosis,  migraine headaches, dyslipidemia (low HDL), type 2 diabetes mellitus, adult ADHD, chronic smoker.  He underwent successful ablation for typical atrial flutter by Dr. Furbish in September 2023 and has not had any sustained arrhythmia recurrence since then.   He had significant improvement in his exertional chest pain after placement of the OM2 stent, but continued to have some exertional angina.  Since starting the full 1000 mg twice daily Ranexa  dose, he has not had angina. Intolerant to long acting nitrates due to severe headache. Has dyspnea and fatigue (after walking one block). Tried to mow his yard and it kicked his butt.  Also has episodes of dyspnea at rest with wheezing, attributed to reactive airway disease. He has a chronic cough, occasionally hemptoic sputum.  He denies palpitations or tachycardia, dizziness or syncope.   He had a repeat cardiac catheterization 03/25/2024 that showed stable  findings.  Stopped ozempic  due to GI side effects. Stopped Trulicity  as well.  Recent A1c close to goal at 7.3%, excellent LDL at 47, chronically low HDL 29.  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  He would like to return to his previous program of 32 hours a week.  On the PET/CT from last November which led to the cardiac catheterization December he was noted to have a 10 mm opacity in the right lower lobe, but this resolved on the follow up scan in April.  He reports a longstanding history of cough, sometimes with hemoptysis that began about 5 years ago when he is convinced that he had what he calls COVID A.  He had a severe respiratory infection in November 2019, before COVID-19 was officially recognized as a problem.  He does have occasional episodes of severe wheezing.  He has had less wheezing on metoprolol  compared to carvedilol .     Past Medical History:  Diagnosis Date   Atrial flutter (HCC)    Bipolar 1 disorder (HCC)    Coronary artery disease    Diabetes mellitus    Dyslipidemia    Emphysema    GERD (gastroesophageal reflux disease)    History of kidney stones    Hypertension    Mental disorder    BIPOLAR DISORDER   Migraine    NSTEMI (non-ST elevated myocardial infarction) (HCC) 09/20/2020   Pneumonia    PONV (postoperative nausea and  vomiting)    Renal disorder    Shortness of breath    Stroke (HCC)    Transient ischemic attack (TIA)     Past Surgical History:  Procedure Laterality Date   A-FLUTTER ABLATION N/A 09/08/2022   Procedure: A-FLUTTER ABLATION;  Surgeon: Mealor, Eulas BRAVO, MD;  Location: [facility];  Service: Cardiovascular;  Laterality: N/A;   COLONOSCOPY WITH PROPOFOL  N/A 10/17/2021   Procedure: COLONOSCOPY WITH PROPOFOL ;  Surgeon: Teressa Toribio SQUIBB, MD;  Location: [facility];  Service: Endoscopy;  Laterality: N/A;    CORONARY ANGIOGRAPHY  09/20/2020   CORONARY STENT INTERVENTION  09/20/2020   CORONARY STENT INTERVENTION   CORONARY STENT INTERVENTION N/A 09/20/2020   Procedure: CORONARY STENT INTERVENTION;  Surgeon: Claudene Victory ORN, MD;  Location: [facility];  Service: Cardiovascular;  Laterality: N/A;   CORONARY STENT INTERVENTION N/A 03/28/2021   Procedure: CORONARY STENT INTERVENTION;  Surgeon: Wonda Sharper, MD;  Location: [facility] INVASIVE CV LAB;  Service: Cardiovascular;  Laterality: N/A;   CORONARY STENT INTERVENTION N/A 11/20/2023   Procedure: CORONARY STENT INTERVENTION;  Surgeon: Wonda Sharper, MD;  Location: [facility] INVASIVE CV LAB;  Service: Cardiovascular;  Laterality: N/A;   HEMORROIDECTOMY     KNEE ARTHROSCOPY     LEFT HEART CATH AND CORONARY ANGIOGRAPHY N/A 09/20/2020   Procedure: LEFT HEART CATH AND CORONARY ANGIOGRAPHY;  Surgeon: Claudene Victory ORN, MD;  Location: [facility];  Service: Cardiovascular;  Laterality: N/A;   LEFT HEART CATH AND CORONARY ANGIOGRAPHY N/A 03/28/2021   Procedure: LEFT HEART CATH AND CORONARY ANGIOGRAPHY;  Surgeon: Wonda Sharper, MD;  Location: [facility] INVASIVE CV LAB;  Service: Cardiovascular;  Laterality: N/A;   LEFT HEART CATH AND CORONARY ANGIOGRAPHY N/A 11/20/2023   Procedure: LEFT HEART CATH AND CORONARY ANGIOGRAPHY;  Surgeon: Wonda Sharper, MD;  Location: [facility] INVASIVE CV LAB;  Service: Cardiovascular;  Laterality: N/A;   LEFT HEART CATH AND CORONARY ANGIOGRAPHY N/A 03/25/2024   Procedure: LEFT HEART CATH AND CORONARY ANGIOGRAPHY;  Surgeon: Anner Alm ORN, MD;  Location: [facility] INVASIVE CV LAB;  Service: Cardiovascular;  Laterality: N/A;   LUMBAR DISC SURGERY     POLYPECTOMY  10/17/2021   Procedure: POLYPECTOMY;  Surgeon: Teressa Toribio SQUIBB, MD;  Location: [facility];  Service: Endoscopy;;   SHOULDER ARTHROSCOPY WITH DISTAL CLAVICLE RESECTION Left 01/20/2023   Procedure: SHOULDER ARTHROSCOPY WITH DISTAL CLAVICLE EXCISION;  Surgeon: Josefina Chew, MD;  Location: [facility]  ORS;  Service: Orthopedics;  Laterality: Left;   SHOULDER ARTHROSCOPY WITH ROTATOR CUFF REPAIR AND SUBACROMIAL DECOMPRESSION Left 01/20/2023   Procedure: SHOULDER ARTHROSCOPY WITH ROTATOR CUFF REPAIR AND SUBACROMIAL DECOMPRESSION;  Surgeon: Josefina Chew, MD;  Location: [facility] OR[facility];  Service: Orthopedics;  Laterality: Left;   TONSILLECTOMY AND ADENOIDECTOMY      Current Medications: Current Meds  Medication Sig   albuterol  (VENTOLIN  HFA) 108 (90 Base) MCG/ACT inhaler Inhale 1-2 puffs into the lungs every 6 (six) hours as needed for wheezing or shortness of breath.   aspirin  EC 81 MG tablet Take 1 tablet (81 mg total) by mouth daily. Swallow whole.   atorvastatin  (LIPITOR ) 80 MG tablet TAKE 1 TABLET BY MOUTH EVERY DAY   clopidogrel  (PLAVIX ) 75 MG tablet TAKE 1 TABLET(75 MG) BY MOUTH DAILY WITH BREAKFAST   glipiZIDE  (GLUCOTROL ) 5 MG tablet Take 1 tablet (5 mg total) by mouth 2 (two) times daily before a meal.   metFORMIN  (GLUCOPHAGE -XR) 500 MG 24 hr tablet Take 4 tablets (2,000 mg total) by mouth daily.   metoprolol   tartrate (LOPRESSOR ) 100 MG tablet Take 1 tablet (100 mg total) by mouth 2 (two) times daily.   OVER THE COUNTER MEDICATION Take 1-2 packets by mouth daily. Goody's Extra Strength Headache Powder (Patient taking differently: Take 1-2 packets by mouth as needed. Goody's Extra Strength Headache Powder)   pantoprazole  (PROTONIX ) 40 MG tablet Take 1 tablet (40 mg total) by mouth daily.   ranolazine  (RANEXA ) 500 MG 12 hr tablet Take 2 tablets (1,000 mg total) by mouth 2 (two) times daily.     Allergies:   Bee venom, Penicillins, Procaine, Isosorbide , and Tape   Social History   Socioeconomic History   Marital status: Divorced    Spouse name: Not on file   Number of children: Not on file   Years of education: Not on file   Highest education level: Not on file  Occupational History   Occupation: Security guard    Employer: [employer]  Tobacco Use   Smoking status: Former     Types: E-cigarettes   Smokeless tobacco: Former    Quit date: 2022  Vaping Use   Vaping status: Former  Substance and Sexual Activity   Alcohol use: No   Drug use: Not Currently   Sexual activity: Not Currently  Other Topics Concern   Not on file  Social History Narrative   Not on file   Social Drivers of Health   Financial Resource Strain: Not on file  Food Insecurity: No Food Insecurity (03/25/2024)   Hunger Vital Sign    Worried About Running Out of Food in the Last Year: Never true    Ran Out of Food in the Last Year: Never true  Transportation Needs: No Transportation Needs (03/25/2024)   PRAPARE - Administrator, Civil Service (Medical): No    Lack of Transportation (Non-Medical): No  Physical Activity: Not on file  Stress: Not on file  Social Connections: Unknown (03/25/2024)   Social Connection and Isolation Panel    Frequency of Communication with Friends and Family: Not on file    Frequency of Social Gatherings with Friends and Family: Not on file    Attends Religious Services: Not on Marketing executive or Organizations: Not on file    Attends Banker Meetings: Not on file    Marital Status: Divorced     Family History: The patient's family history includes Bipolar disorder in an other family member; CAD (age of onset: 12) in his mother; Coronary artery disease in an other family member; Diabetes type II in an other family member; Lung cancer in his father.  ROS:   Please see the history of present illness.     All other systems reviewed and are negative.  EKGs/Labs/Other Studies Reviewed:    The following studies were reviewed today:  Cardiac catheterization 03/25/2024     Previously placed Prox LAD to Mid LAD stent of unknown type is  widely patent.  Mid LAD lesion is 55% stenosed.   2nd Diag lesion is 35% stenosed.   Recently placed 2nd Mrg stent of unknown type is widely patent.   Prox RCA to Mid RCA lesion is 30%  stenosed.  RPAV lesion is 50% stenosed.   RPDA-1 lesion is 40% stenosed.  Previously placed RPDA-2 stent of unknown type is widely patent.   LV end diastolic pressure is normal.   There is no aortic valve stenosis.   Dominance: Right  Stable three-vessel disease with widely patent stents  in the LAD, OM1 and PDA with mild to moderate disease elsewhere.  Most notable nonstented lesion is a 55 % mid LAD lesion which is stable just after the 2nd Diagl branch. Normal LVEDP with normal EF by Echo  PET-CT Oct 26, 2023  Findings are consistent with ischemia in the distribution of a distal oblique marginal branch of the left circumflex coronary artery or a posterolateral ventricular artery branch of the right coronary artery. The study is intermediate risk.   LV perfusion is abnormal. There is evidence of ischemia. There is no evidence of infarction. Defect 1: There is a medium defect with moderate reduction in uptake present in the mid to basal inferolateral location(s) that is reversible. There is normal wall motion in the defect area. Consistent with ischemia. The defect is consistent with abnormal perfusion in the LCx or RCA territories.   Rest left ventricular function is normal. Rest EF: 51%. Stress left ventricular function is normal. Stress EF: 58%. End diastolic cavity size is normal. End systolic cavity size is normal.   Myocardial blood flow was computed to be 0.62ml/g/min at rest and 1.40ml/g/min at stress. Global myocardial blood flow reserve was 2.26 and was normal. MBF reserve was reduced in the left circumflex artery territory (1.71), particularly reduced in the area of the reversible perfusion defect (1.24)   Coronary calcium  assessment not performed due to prior revascularization.   ECHO 09/06/2022   1. Left ventricular ejection fraction, by estimation, is 60 to 65%. The  left ventricle has normal function. The left ventricle has no regional  wall motion abnormalities. There is mild  left ventricular hypertrophy.  Left ventricular diastolic parameters  are indeterminate.   2. Right ventricular systolic function is normal. The right ventricular  size is normal.   3. Left atrial size was mildly dilated.   4. The mitral valve is normal in structure. No evidence of mitral valve  regurgitation. No evidence of mitral stenosis.   5. The aortic valve was not well visualized. Aortic valve regurgitation  is not visualized. No aortic stenosis is present.   6. The inferior vena cava is normal in size with greater than 50%  respiratory variability, suggesting right atrial pressure of 3 mmHg.    Cardiac Catheterization 11/04/2023  1.  Patent left main with no significant stenosis 2.  Patent LAD with a widely patent proximal LAD stent site and no significant restenosis.  The mid LAD bifurcation disease is unchanged with nonobstructive plaquing involving a 35% stenosis at the ostium of the diagonal and a 50% stenosis in the LAD 3.  Progressive and severe stenosis of the second OM branch of the left circumflex, reduced from 99 to 0% with a 2.5 x 38 mm Synergy DES 4.  Large, dominant RCA with mild nonobstructive plaquing and patent stent in the PDA unchanged from the previous study 5.  Normal LVEDP   Recommendations: DAPT with aspirin  and clopidogrel  x 12 months, aggressive risk reduction measures.    Diagnostic Dominance: Right  Intervention   Synergy Xd 2.50x38  EKG: EKG is not ordered today. Personally reviewed 05/10/2024 tracing: shows normal sinus rhythm, normal tracing. Recent Labs: 03/24/2024: ALT 20 05/10/2024: BUN 15; Creatinine, Ser 0.85; Hemoglobin 14.7; Magnesium  1.7; Platelets 286; Potassium 4.2; Sodium 139  Recent Lipid Panel    Component Value Date/Time   CHOL 91 03/24/2024 2210   CHOL 97 (L) 08/21/2023 0940   TRIG 74 03/24/2024 2210   HDL 29 (L) 03/24/2024 2210   HDL 32 (L)  08/21/2023 0940   CHOLHDL 3.1 03/24/2024 2210   VLDL 15 03/24/2024 2210   LDLCALC  47 03/24/2024 2210   LDLCALC 51 08/21/2023 0940     Risk Assessment/Calculations:       Physical Exam:    VS:  BP (!) 148/78   Pulse 65   Wt 242 lb 3.2 oz (109.9 kg)   SpO2 97%   BMI 36.83 kg/m     Wt Readings from Last 3 Encounters:  07/08/24 242 lb 3.2 oz (109.9 kg)  07/04/24 243 lb 12.8 oz (110.6 kg)  05/20/24 242 lb 3.2 oz (109.9 kg)    General: Alert, oriented x3, no distress, obese Head: no evidence of trauma, PERRL, EOMI, no exophtalmos or lid lag, no myxedema, no xanthelasma; normal ears, nose and oropharynx Neck: normal jugular venous pulsations and no hepatojugular reflux; brisk carotid pulses without delay and no carotid bruits Chest: clear to auscultation, no signs of consolidation by percussion or palpation, normal fremitus, symmetrical and full respiratory excursions Cardiovascular: normal position and quality of the apical impulse, regular rhythm, normal first and second heart sounds, no murmurs, rubs or gallops Abdomen: no tenderness or distention, no masses by palpation, no abnormal pulsatility or arterial bruits, normal bowel sounds, no hepatosplenomegaly Extremities: no clubbing, cyanosis or edema; 2+ radial, ulnar and brachial pulses bilaterally; 2+ right femoral, posterior tibial and dorsalis pedis pulses; 2+ left femoral, posterior tibial and dorsalis pedis pulses; no subclavian or femoral bruits Neurological: grossly nonfocal Psych: Normal mood and affect       ASSESSMENT:    1. Coronary artery disease involving native coronary artery of native heart with angina pectoris (HCC)   2. Dyslipidemia, goal LDL below 70   3. SOB (shortness of breath)   4. Wheezing   5. Type 2 diabetes mellitus with other circulatory complication, with long-term current use of insulin  (HCC)   6. Atherosclerosis of aorta (HCC)   7. Severe obesity (BMI 35.0-39.9) with comorbidity (HCC)   8. Status post ablation of atrial flutter         PLAN:    In order of  problems listed above:  CAD: Stable CCS class 2 angina on ranolazine  and metoprolol .  He did not tolerate long-acting nitrates.  On max dose atorvastatin , aspirin , clopidogrel .  Last cardiac cath in April showed no change from last PCI in December 2024. HLP: Excellent LDL (at target under 55), chronically low HDL.  Continue atorvastatin  80 mg daily and efforts at weight loss. History of smoking/probable COPD: has cough and wheezing. Repeat CT showed resolution of suspicious nodule. Avoid nonselective beta blockers. Refer to pulmonology. Quit smoking in 2022 DM: Control has actually deteriorated compared to a year ago and he blames that on his inability to exercise, which is now less of a problem.  I started Ozempic  and has lost about 10 pounds already.  Suspect that his hemoglobin A1c will show improvement.  He reports that his fasting glucose today was only 127. Aortic atherosclerosis: Normal caliber, noted on multiple imaging tests. Obesity: Stopped ozempic  due to GI side effects. Atrial flutter status post ablation: No recurrence in almost 2 years since his ablation procedure.  Off Eliquis .   Shared Decision Making/Informed Consent      This procedure has been fully reviewed with the patient and written informed consent has been obtained.    Medication Adjustments/Labs and Tests Ordered: Current medicines are reviewed at length with the patient today.  Concerns regarding medicines are outlined above.  Orders Placed  This Encounter  Procedures   Ambulatory referral to Pulmonology    No orders of the defined types were placed in this encounter.    Patient Instructions  Medication Instructions:  Your physician recommends that you continue on your current medications as directed. Please refer to the Current Medication list given to you today.  *If you need a refill on your cardiac medications before your next appointment, please call your pharmacy*  Follow-Up: At [facility], you and your health needs are our priority.  As part of our continuing mission to provide you with exceptional heart care, our providers are all part of one team.  This team includes your primary Cardiologist (physician) and Advanced Practice Providers or APPs (Physician Assistants and Nurse Practitioners) who all work together to provide you with the care you need, when you need it.  Your next appointment:   6 month(s)  Provider:   One of our Advanced Practice Providers (APPs): Morse Clause, PA-C  Lamarr Satterfield, NP Miriam Shams, NP  Olivia Pavy, PA-C Josefa Beauvais, NP  Leontine Salen, PA-C Orren Fabry, PA-C  [place], PA-C Ernest Dick, NP  Damien Braver, NP Jon Hails, PA-C  Waddell Donath, PA-C    Dayna Dunn, PA-C  Scott Weaver, PA-C Lum Louis, NP Katlyn West, NP Callie Goodrich, PA-C  Evan Williams, PA-C Sheng Haley, PA-C  Xika Zhao, NP Kathleen Johnson, PA-C   Then, Jerel Balding, MD will plan to see you again in 1 year(s).   We recommend signing up for the patient portal called MyChart.  Sign up information is provided on this After Visit Summary.  MyChart is used to connect with patients for Virtual Visits (Telemedicine).  Patients are able to view lab/test results, encounter notes, upcoming appointments, etc.  Non-urgent messages can be sent to your provider as well.   To learn more about what you can do with MyChart, go to [email or web address].   Other Instructions You have been referred to [facility]. Their office will call you to schedule an appointment with one of their providers.   Signed, Jerel Balding, MD  07/10/2024 3:33 PM    [facility]

## 2024-07-10 ENCOUNTER — Encounter: Payer: Self-pay | Admitting: Cardiovascular Disease

## 2024-07-16 ENCOUNTER — Other Ambulatory Visit: Payer: Self-pay | Admitting: Cardiovascular Disease

## 2024-07-26 ENCOUNTER — Other Ambulatory Visit: Payer: Self-pay | Admitting: Cardiovascular Disease

## 2024-08-22 ENCOUNTER — Other Ambulatory Visit: Payer: Self-pay | Admitting: Cardiovascular Disease

## 2024-08-31 ENCOUNTER — Encounter: Payer: Self-pay | Admitting: Pulmonary Disease

## 2024-08-31 ENCOUNTER — Ambulatory Visit (INDEPENDENT_AMBULATORY_CARE_PROVIDER_SITE_OTHER): Admitting: Pulmonary Disease

## 2024-08-31 ENCOUNTER — Other Ambulatory Visit: Payer: Self-pay | Admitting: Cardiovascular Disease

## 2024-08-31 VITALS — BP 159/90 | HR 68 | Temp 98.4°F | Ht 68.0 in | Wt 257.6 lb

## 2024-08-31 DIAGNOSIS — R911 Solitary pulmonary nodule: Secondary | ICD-10-CM | POA: Diagnosis not present

## 2024-08-31 DIAGNOSIS — R042 Hemoptysis: Secondary | ICD-10-CM

## 2024-08-31 DIAGNOSIS — R0609 Other forms of dyspnea: Secondary | ICD-10-CM

## 2024-08-31 DIAGNOSIS — J4550 Severe persistent asthma, uncomplicated: Secondary | ICD-10-CM

## 2024-08-31 DIAGNOSIS — Z87891 Personal history of nicotine dependence: Secondary | ICD-10-CM

## 2024-08-31 MED ORDER — TRELEGY ELLIPTA 200-62.5-25 MCG/ACT IN AEPB: 1.0000 | INHALATION_SPRAY | Freq: Every day | RESPIRATORY_TRACT | 11 refills | Status: DC

## 2024-08-31 NOTE — Progress Notes (Signed)
 @Patient  ID: Jerry Shaffer, male    DOB: 09-18-1961, 63 y.o.   MRN: [id number]  Chief Complaint  Patient presents with   Consult    Coughing up blood, wheezing, SOB feels like he is coughing up blood clots     Referring provider: Croitoru, Jerel, MD  HPI:   63 y.o. man history of CAD status post stents, asthma here for evaluation of cough, shortness of breath, hemoptysis.  Multiple cardiology notes reviewed.  Patient notes hemoptysis for 5 years.  Coughs every day.  Produces phlegm.  Occasionally will cough up blood start cramps and and then stained pink then eventually coughs up clots.  Then will go away.  Has white frothy sputum in between.  Daily cough productive of sputum for years.  Sometimes will get treatment for this.  Sometimes not.  Again eventually comes and goes.  Discussed at length multiple cross-sectional images over the last 2 and half years with no mass or signs of malignancy.  Likely exacerbated hemoptysis in the setting of antiplatelet therapy.  Prior urinalysis without blood.  Low suspicion for pulmonary renal syndrome.  He does have some shortness of breath.  Gradually worsening over time.  Worse on inclines or stairs.  Or when carrying things.  Okay on relatively flat surfaces.  Given his history of smoking most likely chronic bronchitis with intermittent hemoptysis exacerbated by antiplatelet therapy.  No red flag symptoms or signs in history or on imaging.  Currently not on maintenance inhaler.  Albuterol  seems to help the cough.  Discussed escalating maintenance inhaler.  Discussed shortness of breath and ongoing workup.  Treatment with this with escalating maintenance inhaler as well as additional evaluation, testing.  Discussed with patient most recent CT scan 03/2024 that reveals resolution of previous right lower lobe groundglass nodule.  2 tiny 3 mm solid nodules left upper lobe.  Given history of cigarette smoking, discussed repeat CT scan.  Questionaires /  Pulmonary Flowsheets:   ACT:      No data to display          MMRC:     No data to display          Epworth:      No data to display          Tests:   FENO:  No results found for: NITRICOXIDE  PFT:     No data to display          WALK:      No data to display          Imaging: Personally viewed and as per EMR in discussion this note No results found.  Lab Results: Personally reviewed CBC    Component Value Date/Time   WBC 9.3 05/10/2024 1800   RBC 5.09 05/10/2024 1800   HGB 14.7 05/10/2024 1800   HGB 13.1 08/21/2023 0940   HCT 44.7 05/10/2024 1800   HCT 40.1 08/21/2023 0940   PLT 286 05/10/2024 1800   PLT 234 08/21/2023 0940   MCV 87.8 05/10/2024 1800   MCV 85 08/21/2023 0940   MCH 28.9 05/10/2024 1800   MCHC 32.9 05/10/2024 1800   RDW 13.5 05/10/2024 1800   RDW 13.5 08/21/2023 0940   LYMPHSABS 1.7 05/10/2024 1800   MONOABS 0.6 05/10/2024 1800   EOSABS 0.4 05/10/2024 1800   BASOSABS 0.1 05/10/2024 1800    BMET    Component Value Date/Time   NA 139 05/10/2024 1800   NA 140 08/21/2023 0940  K 4.2 05/10/2024 1800   CL 106 05/10/2024 1800   CO2 21 (L) 05/10/2024 1800   GLUCOSE 107 (H) 05/10/2024 1800   BUN 15 05/10/2024 1800   BUN 17 08/21/2023 0940   CREATININE 0.85 05/10/2024 1800   CALCIUM  8.9 05/10/2024 1800   GFRNONAA >60 05/10/2024 1800   GFRAA >60 07/04/2020 1200    BNP    Component Value Date/Time   BNP 101.3 (H) 09/05/2022 0728    ProBNP No results found for: PROBNP  Specialty Problems   None   Allergies  Allergen Reactions   Bee Venom Anaphylaxis   Penicillins Anaphylaxis    Did it involve swelling of the face/tongue/throat, SOB, or low BP? yes Did it involve sudden or severe rash/hives, skin peeling, or any reaction on the inside of your mouth or nose? unknown Did you need to seek medical attention at a hospital or doctor's office? yes When did it last happen?      1967 If all above answers  are NO, may proceed with cephalosporin use.    Procaine Anaphylaxis   Isosorbide  Other (See Comments)    Caused migraines   Tape Itching and Other (See Comments)    Certain medical tapes make the patient's skin [facility]    Immunization History  Administered Date(s) Administered   Tdap 06/09/2018, 05/24/2023    Past Medical History:  Diagnosis Date   Atrial flutter (HCC)    Bipolar 1 disorder (HCC)    Coronary artery disease    Diabetes mellitus    Dyslipidemia    Emphysema    GERD (gastroesophageal reflux disease)    History of kidney stones    Hypertension    Mental disorder    BIPOLAR DISORDER   Migraine    NSTEMI (non-ST elevated myocardial infarction) (HCC) 09/20/2020   Pneumonia    PONV (postoperative nausea and vomiting)    Renal disorder    Shortness of breath    Stroke (HCC)    Transient ischemic attack (TIA)     Tobacco History: Social History   Tobacco Use  Smoking Status Former   Types: E-cigarettes  Smokeless Tobacco Former   Quit date: 2022   Counseling given: Not Answered   Continue to not smoke  Outpatient Encounter Medications as of 08/31/2024  Medication Sig   albuterol  (VENTOLIN  HFA) 108 (90 Base) MCG/ACT inhaler Inhale 1-2 puffs into the lungs every 6 (six) hours as needed for wheezing or shortness of breath.   aspirin  EC 81 MG tablet Take 1 tablet (81 mg total) by mouth daily. Swallow whole.   atorvastatin  (LIPITOR ) 80 MG tablet TAKE 1 TABLET BY MOUTH EVERY DAY   clopidogrel  (PLAVIX ) 75 MG tablet TAKE 1 TABLET(75 MG) BY MOUTH DAILY WITH BREAKFAST   Fluticasone -Umeclidin-Vilant (TRELEGY ELLIPTA ) 200-62.5-25 MCG/ACT AEPB Inhale 1 puff into the lungs daily.   glipiZIDE  (GLUCOTROL ) 5 MG tablet Take 1 tablet (5 mg total) by mouth 2 (two) times daily before a meal.   metFORMIN  (GLUCOPHAGE -XR) 500 MG 24 hr tablet Take 4 tablets (2,000 mg total) by mouth daily.   metoprolol  tartrate (LOPRESSOR ) 100 MG tablet TAKE 1 TABLET(100 MG) BY MOUTH TWICE  DAILY   OVER THE COUNTER MEDICATION Take 1-2 packets by mouth daily. Goody's Extra Strength Headache Powder (Patient taking differently: Take 1-2 packets by mouth as needed. Goody's Extra Strength Headache Powder)   pantoprazole  (PROTONIX ) 40 MG tablet Take 1 tablet (40 mg total) by mouth daily.   ranolazine  (RANEXA ) 500 MG 12 hr tablet  TAKE 2 TABLETS(1000 MG) BY MOUTH TWICE DAILY   methocarbamol  (ROBAXIN ) 500 MG tablet Take 1 tablet (500 mg total) by mouth every 8 (eight) hours as needed for muscle spasms. (Patient not taking: Reported on 08/31/2024)   nitroGLYCERIN  (NITROSTAT ) 0.4 MG SL tablet Place 1 tablet (0.4 mg total) under the tongue every 5 (five) minutes as needed for chest pain. (Patient not taking: Reported on 08/31/2024)   No facility-administered encounter medications on file as of 08/31/2024.     Review of Systems  Review of Systems  No chest pain with exertion.  No orthopnea or PND.  Comprehensive review of systems otherwise negative. Physical Exam  BP (!) 159/90   Pulse 68   Temp 98.4 F (36.9 C) (Temporal)   Ht 5' 8 (1.727 m)   Wt 257 lb 9.6 oz (116.8 kg)   SpO2 97%   BMI 39.17 kg/m   Wt Readings from Last 5 Encounters:  08/31/24 257 lb 9.6 oz (116.8 kg)  07/08/24 242 lb 3.2 oz (109.9 kg)  07/04/24 243 lb 12.8 oz (110.6 kg)  05/20/24 242 lb 3.2 oz (109.9 kg)  03/25/24 242 lb 12.8 oz (110.1 kg)    BMI Readings from Last 5 Encounters:  08/31/24 39.17 kg/m  07/08/24 36.83 kg/m  07/04/24 37.07 kg/m  05/20/24 36.83 kg/m  03/25/24 36.92 kg/m     Physical Exam General: Sitting in chair, no acute distress Eyes: EOMI, no icterus Neck: Supple, no JVP Pulmonary: Distant, clear Cardiovascular: Warm Abdomen: Nondistended MSK: No synovitis, no joint effusion Neuro: No focal deficits   Assessment & Plan:   Dyspnea on exertion: Suspect multifactorial and related to obesity, deconditioning, cardiac issues coronary disease.  Also poorly controlled asthma.   PFTs for further evaluation.  Escalation of asthma therapy as below.  Severe persistent asthma: Chronic cough, shortness of breath.  Essentially chronic bronchitis.  No maintenance inhaler.  Start Trelegy given severity of disease with hemoptysis.  Continue albuterol  as needed.  Hemoptysis: Suspect related to chronic bronchitis.  Exacerbated by antiplatelets in the setting of his CAD.  Multiple chest images clear without obvious mass, signs of vasculitis etc.  Will screen for vasculitis with ANA, ANCA, anti-GBM.  Lung nodules: Small 3 mm left upper lobe.  1 year follow-up CT scan due 03/2025, will discuss in order scan at next visit as long as he is in agreement.   Return in about 2 months (around 10/31/2024) for f/u Dr. Annella, after PFT.   Donnice JONELLE Annella, MD 08/31/2024   This appointment required 62 minutes of patient care (this includes precharting, chart review, review of results, face-to-face care, etc.).

## 2024-08-31 NOTE — Patient Instructions (Signed)
 It is nice to meet you  I think we should be as aggressive possible to treat the asthma  As such, I recommend Trelegy, 1 puff once a day.  This is a combination of 3 medications and 1 inhaler.  This is the strongest inhaler we have.  Please rinse your mouth thoroughly with water after every use.  If this is too expensive sending a message and we can try something like Advair, this is not quite as aggressive so I prefer to start with the Trelegy but if we are forced to, we can make changes.  Other pulmonary function test for further evaluation.  Ordered blood work today to look into autoimmune or rare reasons people cough up blood.  Return to clinic in 2 months or sooner as needed with Dr. Annella

## 2024-09-03 LAB — ANTI-GBM ANTIBODIES: Anti-GBM Antibodies: <0.2 U (ref 0.0–0.9)

## 2024-09-05 ENCOUNTER — Other Ambulatory Visit: Payer: Self-pay | Admitting: Pulmonary Disease

## 2024-09-05 ENCOUNTER — Telehealth: Payer: Self-pay

## 2024-09-05 NOTE — Telephone Encounter (Signed)
 Copied from CRM [id number]. Topic: Clinical - Medication Prior Auth >> Sep 05, 2024  1:04 PM Joesph PARAS wrote: Reason for CRM: Patient is calling to request a PA be started for Fluticasone -Umeclidin-Vilant (TRELEGY ELLIPTA ) 200-62.5-25 MCG/ACT AEPB. Pharmacy states was sent last week but no chart notes present at this time.  Please advise formulary alternative.  Thanks!

## 2024-09-05 NOTE — Telephone Encounter (Unsigned)
 Copied from CRM [id number]. Topic: Clinical - Medication Refill >> Sep 05, 2024  1:05 PM Joesph PARAS wrote: Medication: albuterol  (VENTOLIN  HFA) 108 (90 Base) MCG/ACT inhaler  Has the patient contacted their pharmacy? No - Patient states knows he needs a refill shortly.  This is the patient's preferred pharmacy:  [id number] [address], [place] - [address] AT [facility] [address] [place] [address] Phone: [phone] Fax: [phone]  Is this the correct pharmacy for this prescription? Yes If no, delete pharmacy and type the correct one.   Has the prescription been filled recently? No  Is the patient out of the medication? Yes  Has the patient been seen for an appointment in the last year OR does the patient have an upcoming appointment? Yes  Can we respond through MyChart? Yes  Agent: Please be advised that Rx refills may take up to 3 business days. We ask that you follow-up with your pharmacy.

## 2024-09-06 ENCOUNTER — Other Ambulatory Visit (HOSPITAL_COMMUNITY): Payer: Self-pay

## 2024-09-06 ENCOUNTER — Telehealth: Payer: Self-pay

## 2024-09-06 LAB — ANTI-NUCLEAR AB-TITER (ANA TITER): ANA Titer 1: 1:40 {titer} — ABNORMAL HIGH

## 2024-09-06 LAB — ANCA SCREEN W REFLEX TITER: ANCA SCREEN: NEGATIVE

## 2024-09-06 LAB — ANA: Anti Nuclear Antibody (ANA): POSITIVE — AB

## 2024-09-06 MED ORDER — BUDESONIDE-FORMOTEROL FUMARATE 160-4.5 MCG/ACT IN AERO: 2.0000 | INHALATION_SPRAY | Freq: Two times a day (BID) | RESPIRATORY_TRACT | 3 refills | Status: DC

## 2024-09-06 NOTE — Telephone Encounter (Signed)
 Pt is aware of below message and voiced his understanding.  Symbicort has been sent to preferred pharmacy. Nothing further needed.

## 2024-09-06 NOTE — Telephone Encounter (Signed)
*  Pulm  Pharmacy Patient Advocate Encounter   Received notification from Fax that prior authorization for Trelegy is required/requested.   Insurance verification completed.   The patient is insured through [employer] .   Per test claim:  Trial and failure of two ICS/LABA is preferred by the insurance.  If suggested medication is appropriate, Please send in a new RX and discontinue this one. If not, please advise as to why it's not appropriate so that we may request a Prior Authorization. Please note, some preferred medications may still require a PA.  If the suggested medications have not been trialed and there are no contraindications to their use, the PA will not be submitted, as it will not be approved.   Brand Advair Diskus- $4.00 BranSymbicort - $4.00 Brand Advair HFA- $4.00 Dulera- $4.00

## 2024-09-06 NOTE — Telephone Encounter (Signed)
 Please advise on PA alternative

## 2024-09-11 ENCOUNTER — Ambulatory Visit: Payer: Self-pay | Admitting: Pulmonary Disease

## 2024-09-14 ENCOUNTER — Telehealth: Payer: Self-pay

## 2024-09-14 ENCOUNTER — Other Ambulatory Visit (HOSPITAL_COMMUNITY): Payer: Self-pay

## 2024-09-14 NOTE — Telephone Encounter (Signed)
 Pharmacy Patient Advocate Encounter   Received notification from Pt Calls Messages that prior authorization for Trulicity  0.75MG /0.5ML auto-injectors is required/requested.   Insurance verification completed.   The patient is insured through [employer].   Per test claim: PA required; PA submitted to above mentioned insurance via Latent Key/confirmation #/EOC [id number] Status is pending

## 2024-09-14 NOTE — Telephone Encounter (Signed)
 Patient needs PA on Trulicity . Has been trying to get since July.

## 2024-09-19 NOTE — Telephone Encounter (Signed)
 Pharmacy Patient Advocate Encounter  Received notification from [employer] that Prior Authorization for Trulicity  0.75 mg/0.36ml has been APPROVED from 09/14/2024 to 09/14/2025   PA #/Case ID/Reference #: [id number]

## 2024-09-28 ENCOUNTER — Other Ambulatory Visit: Payer: Self-pay | Admitting: Internal Medicine

## 2024-09-28 DIAGNOSIS — E1159 Type 2 diabetes mellitus with other circulatory complications: Secondary | ICD-10-CM

## 2024-09-30 NOTE — Telephone Encounter (Signed)
 RX addended to reflect most recent notes

## 2024-10-17 ENCOUNTER — Other Ambulatory Visit: Payer: Self-pay

## 2024-10-17 MED ORDER — TRULICITY 0.75 MG/0.5ML ~~LOC~~ SOAJ: 0.7500 mg | SUBCUTANEOUS | 3 refills | Status: AC

## 2024-10-31 ENCOUNTER — Ambulatory Visit: Admitting: *Deleted

## 2024-10-31 ENCOUNTER — Encounter: Payer: Self-pay | Admitting: Pulmonary Disease

## 2024-10-31 ENCOUNTER — Ambulatory Visit (INDEPENDENT_AMBULATORY_CARE_PROVIDER_SITE_OTHER): Admitting: Pulmonary Disease

## 2024-10-31 VITALS — BP 145/84 | HR 57 | Ht 67.0 in | Wt 264.0 lb

## 2024-10-31 DIAGNOSIS — R0609 Other forms of dyspnea: Secondary | ICD-10-CM

## 2024-10-31 DIAGNOSIS — J410 Simple chronic bronchitis: Secondary | ICD-10-CM

## 2024-10-31 DIAGNOSIS — R7689 Other specified abnormal immunological findings in serum: Secondary | ICD-10-CM

## 2024-10-31 DIAGNOSIS — J4550 Severe persistent asthma, uncomplicated: Secondary | ICD-10-CM

## 2024-10-31 LAB — PULMONARY FUNCTION TEST
DL/VA % pred: 89 %
DL/VA: 3.8 ml/min/mmHg/L
DLCO cor % pred: 84 %
DLCO cor: 20.94 ml/min/mmHg
DLCO unc % pred: 84 %
DLCO unc: 20.94 ml/min/mmHg
FEF 25-75 Post: 3.84 L/s
FEF 25-75 Pre: 2.5 L/s
FEF2575-%Change-Post: 53 %
FEF2575-%Pred-Post: 147 %
FEF2575-%Pred-Pre: 96 %
FEV1-%Change-Post: 15 %
FEV1-%Pred-Post: 86 %
FEV1-%Pred-Pre: 75 %
FEV1-Post: 2.74 L
FEV1-Pre: 2.38 L
FEV1FVC-%Change-Post: -1 %
FEV1FVC-%Pred-Pre: 110 %
FEV6-%Change-Post: 17 %
FEV6-%Pred-Post: 83 %
FEV6-%Pred-Pre: 70 %
FEV6-Post: 3.33 L
FEV6-Pre: 2.83 L
FEV6FVC-%Pred-Post: 105 %
FEV6FVC-%Pred-Pre: 105 %
FVC-%Change-Post: 17 %
FVC-%Pred-Post: 79 %
FVC-%Pred-Pre: 67 %
FVC-Post: 3.33 L
FVC-Pre: 2.85 L
Post FEV1/FVC ratio: 82 %
Post FEV6/FVC ratio: 100 %
Pre FEV1/FVC ratio: 84 %
Pre FEV6/FVC Ratio: 100 %
RV % pred: 131 %
RV: 2.78 L
TLC % pred: 108 %
TLC: 6.91 L

## 2024-10-31 MED ORDER — SPACER/AERO-HOLDING CHAMBERS DEVI: 1 refills | Status: AC

## 2024-10-31 MED ORDER — BREZTRI AEROSPHERE 160-9-4.8 MCG/ACT IN AERO: 2.0000 | INHALATION_SPRAY | Freq: Two times a day (BID) | RESPIRATORY_TRACT | 11 refills | Status: AC

## 2024-10-31 NOTE — Patient Instructions (Signed)
 Full PFT performed today.

## 2024-10-31 NOTE — Progress Notes (Signed)
 Full PFT performed today.

## 2024-10-31 NOTE — Patient Instructions (Signed)
 Nice to see you again  Use Breztri 2 puffs twice a day every day with a spacer.  Rinse your mouth out with water after every use.  Will work on getting this approved.  Use albuterol  2 puffs every 4-6 hours as needed with a spacer.  Referral sent to rheumatology today to evaluate the rash and the weakly positive ANA blood result from last visit.  The other blood results are negative and reassuring in terms of contributing to your hemoptysis or coughing up blood.  Return to clinic in 3 months or sooner as needed with Dr. Annella

## 2024-10-31 NOTE — Progress Notes (Signed)
 @Patient  ID: Jerry Shaffer, male    DOB: 10/03/1961, 63 y.o.   MRN: [id number]  Chief Complaint  Patient presents with   Medical Management of Chronic Issues    Pt states post PFT     Referring provider: Lorriane Dehart, Donnice SAUNDERS, MD  HPI:   63 y.o. man history of CAD status post stents, asthma here for evaluation of cough, shortness of breath, hemoptysis.  Multiple telephone encounter notes reviewed in the interim since last visit.  Return for routine follow-up.  And PFTs.  Symbicort  prescribed last visit.  Had hoped for Trelegy but denied by insurance.  With Symbicort  severity of cough is less.  But ongoing cough occasionally productive of blood still.  More clots than frank blood which I think is an improvement.  PFTs performed consistent with asthma with significant improvement in FEV1 and FVC, air trapping noted.  No fixed obstruction.  Full interpretation below.  He felt like using the albuterol  with a spacer it was way more effective.  Discussed using the spacer moving forward.  We discussed his lab results, ANCA, anti-GBM negative.  ANA weakly +1-40.  He has a chronic malar rash.  He would like further evaluation with rheumatology.  Referral sent today.   HPI initial visit: Patient notes hemoptysis for 5 years.  Coughs every day.  Produces phlegm.  Occasionally will cough up blood start cramps and and then stained pink then eventually coughs up clots.  Then will go away.  Has white frothy sputum in between.  Daily cough productive of sputum for years.  Sometimes will get treatment for this.  Sometimes not.  Again eventually comes and goes.  Discussed at length multiple cross-sectional images over the last 2 and half years with no mass or signs of malignancy.  Likely exacerbated hemoptysis in the setting of antiplatelet therapy.  Prior urinalysis without blood.  Low suspicion for pulmonary renal syndrome.  He does have some shortness of breath.  Gradually worsening over time.  Worse on  inclines or stairs.  Or when carrying things.  Okay on relatively flat surfaces.  Given his history of smoking most likely chronic bronchitis with intermittent hemoptysis exacerbated by antiplatelet therapy.  No red flag symptoms or signs in history or on imaging.  Currently not on maintenance inhaler.  Albuterol  seems to help the cough.  Discussed escalating maintenance inhaler.  Discussed shortness of breath and ongoing workup.  Treatment with this with escalating maintenance inhaler as well as additional evaluation, testing.  Discussed with patient most recent CT scan 03/2024 that reveals resolution of previous right lower lobe groundglass nodule.  2 tiny 3 mm solid nodules left upper lobe.  Given history of cigarette smoking, discussed repeat CT scan.  Questionaires / Pulmonary Flowsheets:   ACT:      No data to display          MMRC:     No data to display          Epworth:      No data to display          Tests:   FENO:  No results found for: NITRICOXIDE  PFT:    Latest Ref Rng & Units 10/31/2024   12:41 PM  PFT Results  FVC-Pre L 2.85  P  FVC-Predicted Pre % 67  P  FVC-Post L 3.33  P  FVC-Predicted Post % 79  P  Pre FEV1/FVC % % 84  P  Post FEV1/FCV % % 82  P  FEV1-Pre L 2.38  P  FEV1-Predicted Pre % 75  P  FEV1-Post L 2.74  P  DLCO uncorrected ml/min/mmHg 20.94  P  DLCO UNC% % 84  P  DLCO corrected ml/min/mmHg 20.94  P  DLCO COR %Predicted % 84  P  DLVA Predicted % 89  P  TLC L 6.91  P  TLC % Predicted % 108  P  RV % Predicted % 131  P    P Preliminary result  Personally viewed interpreted as: Spirometry just of moderate restriction versus air trapping, no fixed structure, significant improvement in FEV1 and FVC after bronchodilator, lung volumes consistent with air trapping, no restriction.  DLCO could not be interpreted due to lack of reproducible quality measurements.  WALK:      No data to display          Imaging: Personally viewed  and as per EMR in discussion this note No results found.  Lab Results: Personally reviewed CBC    Component Value Date/Time   WBC 9.3 05/10/2024 1800   RBC 5.09 05/10/2024 1800   HGB 14.7 05/10/2024 1800   HGB 13.1 08/21/2023 0940   HCT 44.7 05/10/2024 1800   HCT 40.1 08/21/2023 0940   PLT 286 05/10/2024 1800   PLT 234 08/21/2023 0940   MCV 87.8 05/10/2024 1800   MCV 85 08/21/2023 0940   MCH 28.9 05/10/2024 1800   MCHC 32.9 05/10/2024 1800   RDW 13.5 05/10/2024 1800   RDW 13.5 08/21/2023 0940   LYMPHSABS 1.7 05/10/2024 1800   MONOABS 0.6 05/10/2024 1800   EOSABS 0.4 05/10/2024 1800   BASOSABS 0.1 05/10/2024 1800    BMET    Component Value Date/Time   NA 139 05/10/2024 1800   NA 140 08/21/2023 0940   K 4.2 05/10/2024 1800   CL 106 05/10/2024 1800   CO2 21 (L) 05/10/2024 1800   GLUCOSE 107 (H) 05/10/2024 1800   BUN 15 05/10/2024 1800   BUN 17 08/21/2023 0940   CREATININE 0.85 05/10/2024 1800   CALCIUM  8.9 05/10/2024 1800   GFRNONAA >60 05/10/2024 1800   GFRAA >60 07/04/2020 1200    BNP    Component Value Date/Time   BNP 101.3 (H) 09/05/2022 0728    ProBNP No results found for: PROBNP  Specialty Problems   None   Allergies  Allergen Reactions   Bee Venom Anaphylaxis   Penicillins Anaphylaxis    Did it involve swelling of the face/tongue/throat, SOB, or low BP? yes Did it involve sudden or severe rash/hives, skin peeling, or any reaction on the inside of your mouth or nose? unknown Did you need to seek medical attention at a hospital or doctor's office? yes When did it last happen?      1967 If all above answers are NO, may proceed with cephalosporin use.    Procaine Anaphylaxis   Isosorbide  Other (See Comments)    Caused migraines   Tape Itching and Other (See Comments)    Certain medical tapes make the patient's skin [facility]    Immunization History  Administered Date(s) Administered   Tdap 06/09/2018, 05/24/2023    Past Medical History:   Diagnosis Date   Atrial flutter (HCC)    Bipolar 1 disorder (HCC)    Coronary artery disease    Diabetes mellitus    Dyslipidemia    Emphysema    GERD (gastroesophageal reflux disease)    History of kidney stones    Hypertension    Mental disorder  BIPOLAR DISORDER   Migraine    NSTEMI (non-ST elevated myocardial infarction) (HCC) 09/20/2020   Pneumonia    PONV (postoperative nausea and vomiting)    Renal disorder    Shortness of breath    Stroke (HCC)    Transient ischemic attack (TIA)     Tobacco History: Social History   Tobacco Use  Smoking Status Former   Types: E-cigarettes  Smokeless Tobacco Former   Quit date: 2022   Counseling given: Not Answered   Continue to not smoke  Outpatient Encounter Medications as of 10/31/2024  Medication Sig   albuterol  (VENTOLIN  HFA) 108 (90 Base) MCG/ACT inhaler INHALE 1 TO 2 PUFFS INTO THE LUNGS EVERY 6 HOURS AS NEEDED FOR WHEEZING OR SHORTNESS OF BREATH   aspirin  EC 81 MG tablet Take 1 tablet (81 mg total) by mouth daily. Swallow whole.   atorvastatin  (LIPITOR ) 80 MG tablet TAKE 1 TABLET BY MOUTH EVERY DAY   budesonide -glycopyrrolate-formoterol  (BREZTRI AEROSPHERE) 160-9-4.8 MCG/ACT AERO inhaler Inhale 2 puffs into the lungs in the morning and at bedtime.   clopidogrel  (PLAVIX ) 75 MG tablet TAKE 1 TABLET(75 MG) BY MOUTH DAILY WITH BREAKFAST   glipiZIDE  (GLUCOTROL ) 5 MG tablet Take 1 tablet (5 mg total) by mouth 2 (two) times daily before a meal.   metFORMIN  (GLUCOPHAGE -XR) 500 MG 24 hr tablet Take 1000mg  2x a day   methocarbamol  (ROBAXIN ) 500 MG tablet Take 1 tablet (500 mg total) by mouth every 8 (eight) hours as needed for muscle spasms.   metoprolol  tartrate (LOPRESSOR ) 100 MG tablet TAKE 1 TABLET(100 MG) BY MOUTH TWICE DAILY   nitroGLYCERIN  (NITROSTAT ) 0.4 MG SL tablet Place 1 tablet (0.4 mg total) under the tongue every 5 (five) minutes as needed for chest pain.   OVER THE COUNTER MEDICATION Take 1-2 packets by mouth  daily. Goody's Extra Strength Headache Powder (Patient taking differently: Take 1-2 packets by mouth as needed. Goody's Extra Strength Headache Powder)   pantoprazole  (PROTONIX ) 40 MG tablet Take 1 tablet (40 mg total) by mouth daily.   ranolazine  (RANEXA ) 500 MG 12 hr tablet TAKE 2 TABLETS(1000 MG) BY MOUTH TWICE DAILY   Spacer/Aero-Holding Chambers DEVI Use with inhalers   TRULICITY  0.75 MG/0.5ML SOAJ Inject 0.75 mg into the skin once a week.   [DISCONTINUED] budesonide -formoterol  (SYMBICORT ) 160-4.5 MCG/ACT inhaler Inhale 2 puffs into the lungs 2 (two) times daily.   [DISCONTINUED] Fluticasone -Umeclidin-Vilant (TRELEGY ELLIPTA ) 200-62.5-25 MCG/ACT AEPB Inhale 1 puff into the lungs daily.   No facility-administered encounter medications on file as of 10/31/2024.     Review of Systems  Review of Systems  N/a Physical Exam  BP (!) 156/89   Pulse 71   Ht 5' 7 (1.702 m) Comment: per pt  Wt 264 lb (119.7 kg)   SpO2 95%   BMI 41.35 kg/m   Wt Readings from Last 5 Encounters:  10/31/24 264 lb (119.7 kg)  08/31/24 257 lb 9.6 oz (116.8 kg)  07/08/24 242 lb 3.2 oz (109.9 kg)  07/04/24 243 lb 12.8 oz (110.6 kg)  05/20/24 242 lb 3.2 oz (109.9 kg)    BMI Readings from Last 5 Encounters:  10/31/24 41.35 kg/m  08/31/24 39.17 kg/m  07/08/24 36.83 kg/m  07/04/24 37.07 kg/m  05/20/24 36.83 kg/m     Physical Exam General: Sitting in chair, no acute distress Eyes: EOMI, no icterus Neck: Supple, no JVP Pulmonary: Distant, clear Cardiovascular: Warm Abdomen: Nondistended MSK: No synovitis, no joint effusion Neuro: No focal deficits   Assessment & Plan:  Dyspnea on exertion: Suspect multifactorial and related to obesity, deconditioning, cardiac issues coronary disease.  Also poorly controlled asthma.  PFTs consistent with asthma and air trapping, likely driver of symptoms.  Severe persistent asthma/chronic bronchitis: Chronic cough, shortness of breath.  Essentially chronic  bronchitis.  No maintenance inhaler.  Mild improvement although not adequately controlled with Symbicort  or ICS/LABA therapy.  Given failure of ICS/LABA, escalation to Breztri with spacer for this and albuterol  given much more effective medication use when spacer was used during PFT.  Hemoptysis: Suspect related to chronic bronchitis.  Exacerbated by antiplatelets in the setting of his CAD.  Multiple chest images clear without obvious mass, signs of vasculitis etc. ANCA, anti-GBM negative.  Weakly positive ANA.  Positive ANA, facial rash: Referral to rheumatology placed today.  Lung nodules: Small 3 mm left upper lobe.  1 year follow-up CT scan due 03/2025, discussed at next visit.   Return in about 3 months (around 01/31/2025) for f/u Dr. Annella.   Donnice JONELLE Annella, MD 10/31/2024

## 2024-11-02 ENCOUNTER — Encounter: Payer: Self-pay | Admitting: Internal Medicine

## 2024-11-02 ENCOUNTER — Ambulatory Visit: Admitting: Internal Medicine

## 2024-11-02 VITALS — BP 124/70 | HR 77 | Ht 67.0 in | Wt 264.0 lb

## 2024-11-02 DIAGNOSIS — Z794 Long term (current) use of insulin: Secondary | ICD-10-CM

## 2024-11-02 DIAGNOSIS — Z7985 Long-term (current) use of injectable non-insulin antidiabetic drugs: Secondary | ICD-10-CM

## 2024-11-02 DIAGNOSIS — Z7984 Long term (current) use of oral hypoglycemic drugs: Secondary | ICD-10-CM

## 2024-11-02 DIAGNOSIS — E785 Hyperlipidemia, unspecified: Secondary | ICD-10-CM

## 2024-11-02 DIAGNOSIS — E1165 Type 2 diabetes mellitus with hyperglycemia: Secondary | ICD-10-CM

## 2024-11-02 DIAGNOSIS — E1159 Type 2 diabetes mellitus with other circulatory complications: Secondary | ICD-10-CM | POA: Insufficient documentation

## 2024-11-02 LAB — POCT GLYCOSYLATED HEMOGLOBIN (HGB A1C): Hemoglobin A1C: 9.1 % — AB (ref 4.0–5.6)

## 2024-11-02 MED ORDER — INSULIN PEN NEEDLE 32G X 4 MM MISC: 3 refills | Status: AC

## 2024-11-02 MED ORDER — LANTUS SOLOSTAR 100 UNIT/ML ~~LOC~~ SOPN: 25.0000 [IU] | PEN_INJECTOR | Freq: Every day | SUBCUTANEOUS | 3 refills | Status: DC

## 2024-11-02 MED ORDER — GLIPIZIDE 5 MG PO TABS: 5.0000 mg | ORAL_TABLET | Freq: Two times a day (BID) | ORAL | 3 refills | Status: AC

## 2024-11-02 NOTE — Addendum Note (Signed)
 Addended by: CLEOTILDE ROLIN RAMAN on: 11/02/2024 01:24 PM   Modules accepted: Orders

## 2024-11-02 NOTE — Patient Instructions (Addendum)
 Please continue: - Metformin  1000 mg 2x daily - Glipizide  5 mg 2x a day 30 min before meals - Trulicity  0.75 mg weekly   Please start: - Lantus 12 units at bedtime and increase by 2-3 units every 2-3 days until blood sugars in am are <130   Please return in 3 months.

## 2024-11-02 NOTE — Progress Notes (Signed)
 Patient ID: Jerry Shaffer, male   DOB: March 10, 1961, 62 y.o.   MRN: [id number]  HPI: Jerry Shaffer is a 63 y.o.-year-old male, returning for follow-up for DM2, dx in 2013, insulin -dependent (on insulin  briefly in 2016, and then again in 2021, but off now), uncontrolled, with complications (CAD - s/p NSTEMI, s/p stent, TIA, PN). Pt. previously saw Dr. Kassie, but last visit with me 4 months ago.  Interim history: No increased urination, blurry vision, nausea.  He has improved  acid reflux - on Tums and Protonix . He was able to start Trulicity  since last OV - tolerated well. He skipped the dose this week. He continues to have occasional dizzy spells - chronic.  Reviewed HbA1c: Lab Results  Component Value Date   HGBA1C 7.3 (A) 07/04/2024   HGBA1C 6.4 (H) 03/24/2024   HGBA1C 7.0 (A) 03/03/2024   HGBA1C 8.5 (A) 10/27/2023   HGBA1C 7.6 (H) 08/21/2023   HGBA1C 6.9 06/23/2023   HGBA1C 7.5 (H) 01/08/2023   HGBA1C 8.3 (A) 11/04/2022   HGBA1C 8.6 (H) 09/06/2022   HGBA1C 9.0 (A) 08/26/2022   Pt is on a regimen of: - Metformin  ER 1000 mg 2x daily >> occas. Loose stools >> IR >> ER 1000 mg 2x a day - Glipizide  5 mg before bedtime >> before breakfast >> after b'fast >> 5 mg 2x a day before meals - Trulicity  0.75 mg weekly - tolerated well He has needle phobia with syringes.  He has no problems injecting insulin  with pens. He did not want to try Farxiga due to fear of side effects. He was on Trulicity  >> could not obtain it.  He was on Tresiba  >> not affordable. Previously on Ozempic  0.25 >> 0.5 >> GI sxs (AP, D, signif. GERD, CP) >> would not want to restart.  Pt checks his sugars 2-3x a day and they are:  - am: going to bed:<160 >> 150-175 >> 130-200 >> 80-155 >> 150-190 - 2h after b'fast: n/c - before lunch: n/c >> 135-160 - 2h after lunch: n/c - before dinner: 97, 112-115 >> 180-190 >> 118-130 >> 100-150 >> 130-200 - after dinner: 128-150 >> <160, 185 >> 200s >> n/c - bedtime: n/c >>  80-130s - nighttime: n/c Lowest sugar was 22 (many years ago - on 10 mg Glipizide  then) >>...  79 >> 80; he has hypoglycemia awareness at 80.  Highest sugar was 999 (long time ago)  >> ... almost 300 (rice) >> 200s.  Glucometer:?  - no CKD, last BUN/creatinine:  Lab Results  Component Value Date   BUN 15 05/10/2024   BUN 11 03/24/2024   CREATININE 0.85 05/10/2024   CREATININE 0.81 03/24/2024   Lab Results  Component Value Date   MICRALBCREAT 6 07/04/2024  He is not on ACE inhibitor/ARB.  - He has HL; last set of lipids: Lab Results  Component Value Date   CHOL 91 03/24/2024   HDL 29 (L) 03/24/2024   LDLCALC 47 03/24/2024   TRIG 74 03/24/2024   CHOLHDL 3.1 03/24/2024  On Lipitor  80 mg daily.  - last eye exam was several years ago. No DR reportedly.  - + numbness and tingling in his feet.  Last foot exam 07/04/2024.  He also has a history of bipolar disease, emphysema, migraines. He was in the ED with A-fib with RVR 09/06/2022. He had to have an ablation. He had chest pain 03/24/2024 - had a stent placed, his third.  ROS: + see HPI  Past Medical History:  Diagnosis Date   Atrial flutter (HCC)    Bipolar 1 disorder (HCC)    Coronary artery disease    Diabetes mellitus    Dyslipidemia    Emphysema    GERD (gastroesophageal reflux disease)    History of kidney stones    Hypertension    Mental disorder    BIPOLAR DISORDER   Migraine    NSTEMI (non-ST elevated myocardial infarction) (HCC) 09/20/2020   Pneumonia    PONV (postoperative nausea and vomiting)    Renal disorder    Shortness of breath    Stroke (HCC)    Transient ischemic attack (TIA)    Past Surgical History:  Procedure Laterality Date   A-FLUTTER ABLATION N/A 09/08/2022   Procedure: A-FLUTTER ABLATION;  Surgeon: Mealor, Eulas BRAVO, MD;  Location: [facility];  Service: Cardiovascular;  Laterality: N/A;   COLONOSCOPY WITH PROPOFOL  N/A 10/17/2021   Procedure: COLONOSCOPY WITH PROPOFOL ;   Surgeon: Teressa Toribio SQUIBB, MD;  Location: [facility];  Service: Endoscopy;  Laterality: N/A;   CORONARY ANGIOGRAPHY  09/20/2020   CORONARY STENT INTERVENTION  09/20/2020   CORONARY STENT INTERVENTION   CORONARY STENT INTERVENTION N/A 09/20/2020   Procedure: CORONARY STENT INTERVENTION;  Surgeon: Claudene Victory ORN, MD;  Location: [facility];  Service: Cardiovascular;  Laterality: N/A;   CORONARY STENT INTERVENTION N/A 03/28/2021   Procedure: CORONARY STENT INTERVENTION;  Surgeon: Wonda Sharper, MD;  Location: [facility];  Service: Cardiovascular;  Laterality: N/A;   CORONARY STENT INTERVENTION N/A 11/20/2023   Procedure: CORONARY STENT INTERVENTION;  Surgeon: Wonda Sharper, MD;  Location: [facility] INVASIVE CV LAB;  Service: Cardiovascular;  Laterality: N/A;   HEMORROIDECTOMY     KNEE ARTHROSCOPY     LEFT HEART CATH AND CORONARY ANGIOGRAPHY N/A 09/20/2020   Procedure: LEFT HEART CATH AND CORONARY ANGIOGRAPHY;  Surgeon: Claudene Victory ORN, MD;  Location: [facility];  Service: Cardiovascular;  Laterality: N/A;   LEFT HEART CATH AND CORONARY ANGIOGRAPHY N/A 03/28/2021   Procedure: LEFT HEART CATH AND CORONARY ANGIOGRAPHY;  Surgeon: Wonda Sharper, MD;  Location: [facility] INVASIVE CV LAB;  Service: Cardiovascular;  Laterality: N/A;   LEFT HEART CATH AND CORONARY ANGIOGRAPHY N/A 11/20/2023   Procedure: LEFT HEART CATH AND CORONARY ANGIOGRAPHY;  Surgeon: Wonda Sharper, MD;  Location: [facility] INVASIVE CV LAB;  Service: Cardiovascular;  Laterality: N/A;   LEFT HEART CATH AND CORONARY ANGIOGRAPHY N/A 03/25/2024   Procedure: LEFT HEART CATH AND CORONARY ANGIOGRAPHY;  Surgeon: Anner Alm ORN, MD;  Location: [facility] INVASIVE CV LAB;  Service: Cardiovascular;  Laterality: N/A;   LUMBAR DISC SURGERY     POLYPECTOMY  10/17/2021   Procedure: POLYPECTOMY;  Surgeon: Teressa Toribio SQUIBB, MD;  Location: [facility];  Service: Endoscopy;;   SHOULDER ARTHROSCOPY WITH DISTAL CLAVICLE RESECTION Left 01/20/2023   Procedure:  SHOULDER ARTHROSCOPY WITH DISTAL CLAVICLE EXCISION;  Surgeon: Josefina Chew, MD;  Location: [facility];  Service: Orthopedics;  Laterality: Left;   SHOULDER ARTHROSCOPY WITH ROTATOR CUFF REPAIR AND SUBACROMIAL DECOMPRESSION Left 01/20/2023   Procedure: SHOULDER ARTHROSCOPY WITH ROTATOR CUFF REPAIR AND SUBACROMIAL DECOMPRESSION;  Surgeon: Josefina Chew, MD;  Location: [facility];  Service: Orthopedics;  Laterality: Left;   TONSILLECTOMY AND ADENOIDECTOMY     Social History   Socioeconomic History   Marital status: Divorced    Spouse name: Not on file   Number of children: Not on file   Years of education: Not on file   Highest education level: Not on file  Occupational History   Occupation: Manufacturing Systems Engineer: [employer]  Tobacco Use   Smoking status: Former    Types: E-cigarettes   Smokeless tobacco: Former    Quit date: 2022  Vaping Use   Vaping status: Former  Substance and Sexual Activity   Alcohol use: No   Drug use: Not Currently   Sexual activity: Not Currently  Other Topics Concern   Not on file  Social History Narrative   Not on file   Social Drivers of Health   Financial Resource Strain: Not on file  Food Insecurity: No Food Insecurity (03/25/2024)   Hunger Vital Sign    Worried About Running Out of Food in the Last Year: Never true    Ran Out of Food in the Last Year: Never true  Transportation Needs: No Transportation Needs (03/25/2024)   PRAPARE - Administrator, Civil Service (Medical): No    Lack of Transportation (Non-Medical): No  Physical Activity: Not on file  Stress: Not on file  Social Connections: Unknown (03/25/2024)   Social Connection and Isolation Panel    Frequency of Communication with Friends and Family: Not on file    Frequency of Social Gatherings with Friends and Family: Not on file    Attends Religious Services: Not on file    Active Member of Clubs or Organizations: Not on file    Attends Banker Meetings:  Not on file    Marital Status: Divorced  Intimate Partner Violence: Not At Risk (03/25/2024)   Humiliation, Afraid, Rape, and Kick questionnaire    Fear of Current or Ex-Partner: No    Emotionally Abused: No    Physically Abused: No    Sexually Abused: No   Current Outpatient Medications on File Prior to Visit  Medication Sig Dispense Refill   albuterol  (VENTOLIN  HFA) 108 (90 Base) MCG/ACT inhaler INHALE 1 TO 2 PUFFS INTO THE LUNGS EVERY 6 HOURS AS NEEDED FOR WHEEZING OR SHORTNESS OF BREATH 18 g 6   aspirin  EC 81 MG tablet Take 1 tablet (81 mg total) by mouth daily. Swallow whole.     atorvastatin  (LIPITOR ) 80 MG tablet TAKE 1 TABLET BY MOUTH EVERY DAY 90 tablet 3   budesonide -glycopyrrolate-formoterol  (BREZTRI  AEROSPHERE) 160-9-4.8 MCG/ACT AERO inhaler Inhale 2 puffs into the lungs in the morning and at bedtime. 1 each 11   clopidogrel  (PLAVIX ) 75 MG tablet TAKE 1 TABLET(75 MG) BY MOUTH DAILY WITH BREAKFAST 90 tablet 3   glipiZIDE  (GLUCOTROL ) 5 MG tablet Take 1 tablet (5 mg total) by mouth 2 (two) times daily before a meal. 180 tablet 3   metFORMIN  (GLUCOPHAGE -XR) 500 MG 24 hr tablet Take 1000mg  2x a day 360 tablet 3   methocarbamol  (ROBAXIN ) 500 MG tablet Take 1 tablet (500 mg total) by mouth every 8 (eight) hours as needed for muscle spasms. 30 tablet 0   metoprolol  tartrate (LOPRESSOR ) 100 MG tablet TAKE 1 TABLET(100 MG) BY MOUTH TWICE DAILY 180 tablet 3   nitroGLYCERIN  (NITROSTAT ) 0.4 MG SL tablet Place 1 tablet (0.4 mg total) under the tongue every 5 (five) minutes as needed for chest pain. 25 tablet 5   OVER THE COUNTER MEDICATION Take 1-2 packets by mouth daily. Goody's Extra Strength Headache Powder (Patient taking differently: Take 1-2 packets by mouth as needed. Good[employer]'s Extra Strength Headache Powder)     pantoprazole  (PROTONIX ) 40 MG tablet Take 1 tablet (40 mg total) by mouth daily. 30 tablet 11   ranolazine  (  RANEXA ) 500 MG 12 hr tablet TAKE 2 TABLETS(1000 MG) BY MOUTH TWICE DAILY  360 tablet 3   Spacer/Aero-Holding Chambers DEVI Use with inhalers 1 each 1   TRULICITY  0.75 MG/0.5ML SOAJ Inject 0.75 mg into the skin once a week. 6 mL 3   No current facility-administered medications on file prior to visit.   Allergies  Allergen Reactions   Bee Venom Anaphylaxis   Penicillins Anaphylaxis    Did it involve swelling of the face/tongue/throat, SOB, or low BP? yes Did it involve sudden or severe rash/hives, skin peeling, or any reaction on the inside of your mouth or nose? unknown Did you need to seek medical attention at a hospital or doctor's office? yes When did it last happen?      1967 If all above answers are NO, may proceed with cephalosporin use.    Procaine Anaphylaxis   Isosorbide  Other (See Comments)    Caused migraines   Tape Itching and Other (See Comments)    Certain medical tapes make the patient's skin ITCH   Family History  Problem Relation Age of Onset   Diabetes type II Other    Coronary artery disease Other    Bipolar disorder Other    Lung cancer Father    CAD Mother 73   PE: BP 124/70   Pulse 77   Ht 5' 7 (1.702 m)   Wt 264 lb (119.7 kg)   SpO2 93%   BMI 41.35 kg/m  Wt Readings from Last 10 Encounters:  11/02/24 264 lb (119.7 kg)  10/31/24 264 lb (119.7 kg)  08/31/24 257 lb 9.6 oz (116.8 kg)  07/08/24 242 lb 3.2 oz (109.9 kg)  07/04/24 243 lb 12.8 oz (110.6 kg)  05/20/24 242 lb 3.2 oz (109.9 kg)  03/25/24 242 lb 12.8 oz (110.1 kg)  03/03/24 238 lb 12.8 oz (108.3 kg)  02/26/24 237 lb 12.8 oz (107.9 kg)  12/08/23 246 lb 3.2 oz (111.7 kg)   Constitutional: overweight, in NAD Eyes: no exophthalmos ENT: no thyromegaly, no cervical lymphadenopathy Cardiovascular: RRR, No MRG Respiratory: CTA B Musculoskeletal: no deformities Skin:  no rashes Neurological: + mild tremor with outstretched hands  ASSESSMENT: 1. DM2, insulin -dependent, uncontrolled, with complications - CAD, s/p NSTEMI (11/20/2023- last) - cerebro-vascular  disease, s/p TIA - PN  2. HL  3.  Obesity class III  PLAN:  1. Patient with longstanding, uncontrolled, type 2 diabetes, on oral antidiabetic regimen with metformin  and sulfonylurea to which I recommended to add a low-dose GLP-1 receptor agonist at last visit.  At that time, he was off Ozempic  due to price.  He previously had GERD with higher doses of Ozempic .  HbA1c before last visit was 6.4%, however, it was 7.3% at our last visit.  He was not checking sugars after he lost his meter but sugars were at or close to goal whenever he was checking with only few mild hyperglycemic spikes.  Upon questioning, he was taking glipizide  without relationship to the meals and we discussed about moving this 30 minutes before breakfast and dinner.  I also recommended to start Trulicity  at the lowest dose and increase as tolerated. -At today's visit, sugars are higher in the morning and they are also above target before lunch and before dinner.  At that time, he mentions that they are at goal.  I am surprised about his HbA1c which is higher (see below), despite starting to take the glipizide  correctly and adding Trulicity  at last visit.  At this point,  we need to restart insulin .  Tresiba  was not covered for him in the past.  Will try to send Lantus  to his pharmacy and discussed about starting at a low dose and titrating the dose up as needed to improve his morning blood sugars.  Will continue the rest of the regimen. - I suggested to:  Patient Instructions  Please continue: - Metformin  1000 mg 2x daily - Glipizide  5 mg 2x a day 30 min before meals - Trulicity  0.75 mg weekly   Please start: - Lantus  12 units at bedtime and increase by 2-3 units every 2-3 days until blood sugars in am are <130   Please return in 3 months.  - we checked his HbA1c: 9.1% (higher) - advised to check sugars at different times of the day - 1x a day, rotating check times - advised for yearly eye exams >> he is not UTD -he was not  able to afford this in the past but now he has Medicaid and feels that he may be able to do so - return to clinic in 2-3 months  2. HL - Latest lipid panel was at goal in 03/2024 except for low HDL: Lab Results  Component Value Date   CHOL 91 03/24/2024   HDL 29 (L) 03/24/2024   LDLCALC 47 03/24/2024   TRIG 74 03/24/2024   CHOLHDL 3.1 03/24/2024  -He continues on Lipitor  80 mg daily without side effects  3.  Obesity class III -he lost 18 pounds after starting Ozempic  but then gained 11 pounds back after coming off due to lack of insurance coverage -We restarted Ozempic  and he was getting samples - he was able to again lose weight (11 pounds) on the GLP-1 receptor agonist.  However, he mentions that he had significant GI side effects with this and would not want to restart - At last visit, I advised him to try to start Trulicity  low-dose and increase the dose as tolerated.  He is not taking this.  He still has GERD so for now I did not suggest to increase the dose. - He gained 21 pounds since last visit!  Unfortunately, we need to start him back on insulin  which is weight inducing.  We did discuss about the need to improve his diet  Lela Fendt, MD PhD [facility] Endocrinology

## 2024-11-04 ENCOUNTER — Encounter: Payer: Self-pay | Admitting: Internal Medicine

## 2024-11-04 ENCOUNTER — Encounter: Payer: Self-pay | Admitting: Cardiovascular Disease

## 2024-11-04 ENCOUNTER — Encounter: Payer: Self-pay | Admitting: Pulmonary Disease

## 2024-11-04 ENCOUNTER — Ambulatory Visit: Payer: Self-pay | Attending: Cardiovascular Disease | Admitting: Cardiovascular Disease

## 2024-11-04 VITALS — BP 130/82 | HR 78 | Resp 16 | Ht 67.0 in | Wt 264.0 lb

## 2024-11-04 DIAGNOSIS — Z9889 Other specified postprocedural states: Secondary | ICD-10-CM | POA: Diagnosis not present

## 2024-11-04 DIAGNOSIS — I25119 Atherosclerotic heart disease of native coronary artery with unspecified angina pectoris: Secondary | ICD-10-CM | POA: Diagnosis not present

## 2024-11-04 DIAGNOSIS — J449 Chronic obstructive pulmonary disease, unspecified: Secondary | ICD-10-CM | POA: Diagnosis not present

## 2024-11-04 DIAGNOSIS — E7800 Pure hypercholesterolemia, unspecified: Secondary | ICD-10-CM | POA: Diagnosis not present

## 2024-11-04 DIAGNOSIS — E1165 Type 2 diabetes mellitus with hyperglycemia: Secondary | ICD-10-CM | POA: Diagnosis not present

## 2024-11-04 DIAGNOSIS — Z8679 Personal history of other diseases of the circulatory system: Secondary | ICD-10-CM | POA: Insufficient documentation

## 2024-11-04 DIAGNOSIS — E1159 Type 2 diabetes mellitus with other circulatory complications: Secondary | ICD-10-CM | POA: Diagnosis not present

## 2024-11-04 DIAGNOSIS — I700 Atherosclerosis of aorta: Secondary | ICD-10-CM | POA: Insufficient documentation

## 2024-11-04 MED ORDER — METHOCARBAMOL 500 MG PO TABS: 500.0000 mg | ORAL_TABLET | Freq: Three times a day (TID) | ORAL | 0 refills | Status: AC | PRN

## 2024-11-04 MED ORDER — ATORVASTATIN CALCIUM 80 MG PO TABS: 80.0000 mg | ORAL_TABLET | Freq: Every day | ORAL | 3 refills | Status: AC

## 2024-11-04 MED ORDER — CLOPIDOGREL BISULFATE 75 MG PO TABS: 75.0000 mg | ORAL_TABLET | Freq: Every day | ORAL | 3 refills | Status: AC

## 2024-11-04 MED ORDER — METOPROLOL SUCCINATE ER 200 MG PO TB24: 200.0000 mg | ORAL_TABLET | Freq: Every day | ORAL | 3 refills | Status: AC

## 2024-11-04 MED ORDER — RANOLAZINE ER 500 MG PO TB12: 1000.0000 mg | ORAL_TABLET | Freq: Two times a day (BID) | ORAL | 3 refills | Status: DC

## 2024-11-04 NOTE — Patient Instructions (Signed)
 Medication Instructions:  Stop Metoprolol  Tartrate  Start Metoprolol  Succinate 200 mg daily *If you need a refill on your cardiac medications before your next appointment, please call your pharmacy*  Lab Work: None ordered If you have labs (blood work) drawn today and your tests are completely normal, you will receive your results only by: MyChart Message (if you have MyChart) OR A paper copy in the mail If you have any lab test that is abnormal or we need to change your treatment, we will call you to review the results.  Testing/Procedures: None ordered  Follow-Up: At [facility], you and your health needs are our priority.  As part of our continuing mission to provide you with exceptional heart care, our providers are all part of one team.  This team includes your primary Cardiologist (physician) and Advanced Practice Providers or APPs (Physician Assistants and Nurse Practitioners) who all work together to provide you with the care you need, when you need it.  Your next appointment:   1 year(s)  Provider:   Jerel Balding, MD    We recommend signing up for the patient portal called MyChart.  Sign up information is provided on this After Visit Summary.  MyChart is used to connect with patients for Virtual Visits (Telemedicine).  Patients are able to view lab/test results, encounter notes, upcoming appointments, etc.  Non-urgent messages can be sent to your provider as well.   To learn more about what you can do with MyChart, go to [email or web address].

## 2024-11-04 NOTE — Progress Notes (Unsigned)
 Cardiology Office Note:    Date:  11/05/2024   ID:  Jerry Shaffer, DOB [birth year], MRN [id number]  PCP:  Patient, No Pcp Per  [facility] Cardiologist:  Jerry Balding, MD  [facility] Electrophysiologist:  Eulas FORBES Furbish, MD   Referring MD: No ref. provider found   Chief Complaint  Patient presents with   Coronary artery disease involving native coronary artery of   Follow-up    1 year    History of Present Illness:    Jerry Shaffer is a 63 y.o. male with a hx of rcoronary artery disease presenting with inferior NSTEMI on September 20, 2020 due to occlusion of the mid PDA emergency PCI-drug-eluting stent (18 x 2.75 mm Onyx), subsequently presented with unstable angina and underwent PCI-drug-eluting stent (3.5 x 20 mm Synergy to the proximal LAD on March 28, 2021), subsequently had worsening exertional angina and PET scan showed inferolateral ischemia so he received a drug-eluting stent to a severe stenosis in the OM2(11/20/2023 Synergy XD 2.50 x 38 mm).  Repeat angiography for chest pain 03/25/2024 showed no new stenoses.  Also known to have aortic atherosclerosis,  migraine headaches, dyslipidemia (low HDL), type 2 diabetes mellitus, adult ADHD, chronic smoker.  He underwent successful ablation for typical atrial flutter by Dr. Furbish in September 2023 and has not had any sustained arrhythmia recurrence since then.   He is doing fairly well from a cardiovascular point of view.  His angina is very well-controlled on high-dose metoprolol  (total of 200 mg daily), ranolazine  at 1000 mg twice daily.  He did not tolerate nitrates due to headache.  He has not had worsening of his shortness of breath with activity but does have occasional wheezing triggered by environmental factors.  He has had less wheezing on metoprolol  compared to carvedilol .His pulmonologist is Dr. Annella.  He has been trying to adjust his inhaler, but they are having some difficulty with insurance  coverage.  He has not had palpitations, dizziness, syncope, leg edema or claudication.  His most recent invasive coronary evaluation was cardiac catheterization 03/25/2024 that showed stable findings.  He has normal left ventricular systolic function.  Unfortunately, he has gained weight, is morbidly obese glycemic control has not been great.  Latest hemoglobin A1c just a few days ago was 9.1%.  He is currently on Trulicity , metformin  and glipizide  and due to start treatment with Lantus .  We have talked about the benefit of SGLT2 inhibitors, but cost has been a barrier.  He did not tolerate Ozempic  due to GI side effects.  On the other hand his lipid parameters are good with a total cholesterol of 91, LDL 47 and HDL chronically low at 29.  Triglycerides are normal.  On the cardiac PET/CT from November 2024 he was noted to have a 10 mm opacity in the right lower lobe, but this resolved on the follow up scan in April 2025.     Past Medical History:  Diagnosis Date   Atrial flutter (HCC)    Bipolar 1 disorder (HCC)    Coronary artery disease    Diabetes mellitus    Dyslipidemia    Emphysema    GERD (gastroesophageal reflux disease)    History of kidney stones    Hypertension    Mental disorder    BIPOLAR DISORDER   Migraine    NSTEMI (non-ST elevated myocardial infarction) (HCC) 09/20/2020   Pneumonia    PONV (postoperative nausea and vomiting)    Renal disorder    Shortness  of breath    Stroke (HCC)    Transient ischemic attack (TIA)     Past Surgical History:  Procedure Laterality Date   A-FLUTTER ABLATION N/A 09/08/2022   Procedure: A-FLUTTER ABLATION;  Surgeon: Mealor, Eulas BRAVO, MD;  Location: [facility];  Service: Cardiovascular;  Laterality: N/A;   COLONOSCOPY WITH PROPOFOL  N/A 10/17/2021   Procedure: COLONOSCOPY WITH PROPOFOL ;  Surgeon: Teressa Toribio SQUIBB, MD;  Location: [facility];  Service: Endoscopy;  Laterality: N/A;   CORONARY ANGIOGRAPHY  09/20/2020    CORONARY STENT INTERVENTION  09/20/2020   CORONARY STENT INTERVENTION   CORONARY STENT INTERVENTION N/A 09/20/2020   Procedure: CORONARY STENT INTERVENTION;  Surgeon: Claudene Victory ORN, MD;  Location: [facility];  Service: Cardiovascular;  Laterality: N/A;   CORONARY STENT INTERVENTION N/A 03/28/2021   Procedure: CORONARY STENT INTERVENTION;  Surgeon: Wonda Sharper, MD;  Location: [facility] INVASIVE CV LAB;  Service: Cardiovascular;  Laterality: N/A;   CORONARY STENT INTERVENTION N/A 11/20/2023   Procedure: CORONARY STENT INTERVENTION;  Surgeon: Wonda Sharper, MD;  Location: [facility] INVASIVE CV LAB;  Service: Cardiovascular;  Laterality: N/A;   HEMORROIDECTOMY     KNEE ARTHROSCOPY     LEFT HEART CATH AND CORONARY ANGIOGRAPHY N/A 09/20/2020   Procedure: LEFT HEART CATH AND CORONARY ANGIOGRAPHY;  Surgeon: Claudene Victory ORN, MD;  Location: [facility];  Service: Cardiovascular;  Laterality: N/A;   LEFT HEART CATH AND CORONARY ANGIOGRAPHY N/A 03/28/2021   Procedure: LEFT HEART CATH AND CORONARY ANGIOGRAPHY;  Surgeon: Wonda Sharper, MD;  Location: [facility] INVASIVE CV LAB;  Service: Cardiovascular;  Laterality: N/A;   LEFT HEART CATH AND CORONARY ANGIOGRAPHY N/A 11/20/2023   Procedure: LEFT HEART CATH AND CORONARY ANGIOGRAPHY;  Surgeon: Wonda Sharper, MD;  Location: [facility] INVASIVE CV LAB;  Service: Cardiovascular;  Laterality: N/A;   LEFT HEART CATH AND CORONARY ANGIOGRAPHY N/A 03/25/2024   Procedure: LEFT HEART CATH AND CORONARY ANGIOGRAPHY;  Surgeon: Anner Alm ORN, MD;  Location: [facility] INVASIVE CV LAB;  Service: Cardiovascular;  Laterality: N/A;   LUMBAR DISC SURGERY     POLYPECTOMY  10/17/2021   Procedure: POLYPECTOMY;  Surgeon: Teressa Toribio SQUIBB, MD;  Location: [facility];  Service: Endoscopy;;   SHOULDER ARTHROSCOPY WITH DISTAL CLAVICLE RESECTION Left 01/20/2023   Procedure: SHOULDER ARTHROSCOPY WITH DISTAL CLAVICLE EXCISION;  Surgeon: Josefina Chew, MD;  Location: [facility];  Service: Orthopedics;   Laterality: Left;   SHOULDER ARTHROSCOPY WITH ROTATOR CUFF REPAIR AND SUBACROMIAL DECOMPRESSION Left 01/20/2023   Procedure: SHOULDER ARTHROSCOPY WITH ROTATOR CUFF REPAIR AND SUBACROMIAL DECOMPRESSION;  Surgeon: Josefina Chew, MD;  Location: [facility];  Service: Orthopedics;  Laterality: Left;   TONSILLECTOMY AND ADENOIDECTOMY      Current Medications: Current Meds  Medication Sig   albuterol  (VENTOLIN  HFA) 108 (90 Base) MCG/ACT inhaler INHALE 1 TO 2 PUFFS INTO THE LUNGS EVERY 6 HOURS AS NEEDED FOR WHEEZING OR SHORTNESS OF BREATH   aspirin  EC 81 MG tablet Take 1 tablet (81 mg total) by mouth daily. Swallow whole.   budesonide -glycopyrrolate-formoterol  (BREZTRI  AEROSPHERE) 160-9-4.8 MCG/ACT AERO inhaler Inhale 2 puffs into the lungs in the morning and at bedtime.   glipiZIDE  (GLUCOTROL ) 5 MG tablet Take 1 tablet (5 mg total) by mouth 2 (two) times daily before a meal.   Insulin  Pen Needle 32G X 4 MM MISC Use 1x a day   metFORMIN  (GLUCOPHAGE -XR) 500 MG 24 hr tablet Take 1000mg  2x a day   metoprolol  succinate (TOPROL -XL) 200 MG 24 hr tablet Take  1 tablet (200 mg total) by mouth daily. Take with or immediately following a meal.   nitroGLYCERIN  (NITROSTAT ) 0.4 MG SL tablet Place 1 tablet (0.4 mg total) under the tongue every 5 (five) minutes as needed for chest pain.   OVER THE COUNTER MEDICATION Take 1-2 packets by mouth daily. [employer] Extra Strength Headache Powder   pantoprazole  (PROTONIX ) 40 MG tablet Take 1 tablet (40 mg total) by mouth daily.   Spacer/Aero-Holding Raguel FRENCH Use with inhalers   TRULICITY  0.75 MG/0.5ML SOAJ Inject 0.75 mg into the skin once a week.   [DISCONTINUED] atorvastatin  (LIPITOR ) 80 MG tablet TAKE 1 TABLET BY MOUTH EVERY DAY   [DISCONTINUED] clopidogrel  (PLAVIX ) 75 MG tablet TAKE 1 TABLET(75 MG) BY MOUTH DAILY WITH BREAKFAST   [DISCONTINUED] methocarbamol  (ROBAXIN ) 500 MG tablet Take 1 tablet (500 mg total) by mouth every 8 (eight) hours as needed for muscle spasms.    [DISCONTINUED] metoprolol  tartrate (LOPRESSOR ) 100 MG tablet TAKE 1 TABLET(100 MG) BY MOUTH TWICE DAILY   [DISCONTINUED] ranolazine  (RANEXA ) 500 MG 12 hr tablet TAKE 2 TABLETS(1000 MG) BY MOUTH TWICE DAILY     Allergies:   Bee venom, Penicillins, Procaine, Isosorbide , and Tape   Family History: The patient's family history includes Bipolar disorder in an other family member; CAD (age of onset: 53) in his mother; Coronary artery disease in an other family member; Diabetes type II in an other family member; Lung cancer in his father.  ROS:   Please see the history of present illness.     All other systems reviewed and are negative.  EKGs/Labs/Other Studies Reviewed:    The following studies were reviewed today:  Cardiac catheterization 03/25/2024     Previously placed Prox LAD to Mid LAD stent of unknown type is  widely patent.  Mid LAD lesion is 55% stenosed.   2nd Diag lesion is 35% stenosed.   Recently placed 2nd Mrg stent of unknown type is widely patent.   Prox RCA to Mid RCA lesion is 30% stenosed.  RPAV lesion is 50% stenosed.   RPDA-1 lesion is 40% stenosed.  Previously placed RPDA-2 stent of unknown type is widely patent.   LV end diastolic pressure is normal.   There is no aortic valve stenosis.   Dominance: Right  Stable three-vessel disease with widely patent stents in the LAD, OM1 and PDA with mild to moderate disease elsewhere.  Most notable nonstented lesion is a 55 % mid LAD lesion which is stable just after the 2nd Diagl branch. Normal LVEDP with normal EF by Echo  PET-CT Oct 26, 2023  Findings are consistent with ischemia in the distribution of a distal oblique marginal branch of the left circumflex coronary artery or a posterolateral ventricular artery branch of the right coronary artery. The study is intermediate risk.   LV perfusion is abnormal. There is evidence of ischemia. There is no evidence of infarction. Defect 1: There is a medium defect with moderate  reduction in uptake present in the mid to basal inferolateral location(s) that is reversible. There is normal wall motion in the defect area. Consistent with ischemia. The defect is consistent with abnormal perfusion in the LCx or RCA territories.   Rest left ventricular function is normal. Rest EF: 51%. Stress left ventricular function is normal. Stress EF: 58%. End diastolic cavity size is normal. End systolic cavity size is normal.   Myocardial blood flow was computed to be 0.62ml/g/min at rest and 1.40ml/g/min at stress. Global myocardial blood flow reserve was  2.26 and was normal. MBF reserve was reduced in the left circumflex artery territory (1.71), particularly reduced in the area of the reversible perfusion defect (1.24)   Coronary calcium  assessment not performed due to prior revascularization.   ECHO 03/25/2024  1. Left ventricular ejection fraction, by estimation, is 55 to 60%. The  left ventricle has normal function. The left ventricle has no regional  wall motion abnormalities. There is mild concentric left ventricular  hypertrophy. Left ventricular diastolic  parameters were normal.   2. Right ventricular systolic function is normal. The right ventricular  size is normal. Tricuspid regurgitation signal is inadequate for assessing  PA pressure.   3. The mitral valve is normal in structure. No evidence of mitral valve  regurgitation. No evidence of mitral stenosis.   4. The aortic valve is normal in structure. Aortic valve regurgitation is  not visualized. No aortic stenosis is present.   5. The inferior vena cava is normal in size with greater than 50%  respiratory variability, suggesting right atrial pressure of 3 mmHg.    Cardiac Catheterization 11/04/2023  1.  Patent left main with no significant stenosis 2.  Patent LAD with a widely patent proximal LAD stent site and no significant restenosis.  The mid LAD bifurcation disease is unchanged with nonobstructive plaquing  involving a 35% stenosis at the ostium of the diagonal and a 50% stenosis in the LAD 3.  Progressive and severe stenosis of the second OM branch of the left circumflex, reduced from 99 to 0% with a 2.5 x 38 mm Synergy DES 4.  Large, dominant RCA with mild nonobstructive plaquing and patent stent in the PDA unchanged from the previous study 5.  Normal LVEDP   Recommendations: DAPT with aspirin  and clopidogrel  x 12 months, aggressive risk reduction measures.    Diagnostic Dominance: Right  Intervention   Synergy Xd 2.50x38  EKG:   EKG Interpretation Date/Time:  Friday November 04 2024 08:50:18 EST Ventricular Rate:  75 PR Interval:  156 QRS Duration:  82 QT Interval:  390 QTC Calculation: 435 R Axis:   7  Text Interpretation: Normal sinus rhythm Normal ECG When compared with ECG of 10-May-2024 17:21, No significant change was found Confirmed by Sascha Palma ([id number]) on 11/04/2024 8:58:49 AM        Recent Labs: 03/24/2024: ALT 20 05/10/2024: BUN 15; Creatinine, Ser 0.85; Hemoglobin 14.7; Magnesium  1.7; Platelets 286; Potassium 4.2; Sodium 139  Recent Lipid Panel    Component Value Date/Time   CHOL 91 03/24/2024 2210   CHOL 97 (L) 08/21/2023 0940   TRIG 74 03/24/2024 2210   HDL 29 (L) 03/24/2024 2210   HDL 32 (L) 08/21/2023 0940   CHOLHDL 3.1 03/24/2024 2210   VLDL 15 03/24/2024 2210   LDLCALC 47 03/24/2024 2210   LDLCALC 51 08/21/2023 0940     Risk Assessment/Calculations:       Physical Exam:    VS:  BP 130/82 (BP Location: Left Arm, Patient Position: Sitting, Cuff Size: Large)   Pulse 78   Resp 16   Ht 5' 7 (1.702 m)   Wt 264 lb (119.7 kg)   SpO2 96%   BMI 41.35 kg/m     Wt Readings from Last 3 Encounters:  11/04/24 264 lb (119.7 kg)  11/02/24 264 lb (119.7 kg)  10/31/24 264 lb (119.7 kg)     General: Alert, oriented x3, no distress, morbidly obese Head: no evidence of trauma, PERRL, EOMI, no exophtalmos or lid lag, no myxedema, no  xanthelasma;  normal ears, nose and oropharynx Neck: normal jugular venous pulsations and no hepatojugular reflux; brisk carotid pulses without delay and no carotid bruits Chest: clear to auscultation, no signs of consolidation by percussion or palpation, normal fremitus, symmetrical and full respiratory excursions Cardiovascular: normal position and quality of the apical impulse, regular rhythm, normal first and second heart sounds, no murmurs, rubs or gallops Abdomen: no tenderness or distention, no masses by palpation, no abnormal pulsatility or arterial bruits, normal bowel sounds, no hepatosplenomegaly Extremities: no clubbing, cyanosis or edema; 2+ radial, ulnar and brachial pulses bilaterally; 2+ right femoral, posterior tibial and dorsalis pedis pulses; 2+ left femoral, posterior tibial and dorsalis pedis pulses; no subclavian or femoral bruits Neurological: grossly nonfocal Psych: Normal mood and affect    ASSESSMENT:    1. Coronary artery disease involving native coronary artery of native heart with angina pectoris   2. Hypercholesterolemia   3. Chronic obstructive pulmonary disease, unspecified COPD type (HCC)   4. Poorly controlled type 2 diabetes mellitus with circulatory disorder (HCC)   5. Atherosclerosis of aorta   6. Morbid obesity (HCC)   7. Status post ablation of atrial flutter         PLAN:    In order of problems listed above:  CAD: Well-controlled stable angina pectoris on metoprolol  and ranolazine , intolerant of long-acting nitrates due to severe headaches.  Consolidate to metoprolol  succinate for convenience.  On max dose atorvastatin , aspirin , clopidogrel .  Last cardiac cath in April showed no change from last PCI in December 2024. HLP: Excellent LDL (at target less than 55), continue high-dose atorvastatin .  His HDL not improved without significant weight loss. Asthma/ COPD: Seeing Dr. Annella. Quit smoking in 2022.  Avoid nonselective beta-blockers. DM: Did not  tolerate Ozempic .  Currently on Trulicity  and metformin , but also glipizide  and soon to start on insulin .  Discussed the adverse effects of hyperinsulinemia on cardiovascular outcomes.  Needs to work harder on losing weight. Aortic atherosclerosis: Normal caliber aorta, noted on multiple imaging tests. Obesity: Stopped ozempic  due to GI side effects.  Trulicity  does not seem to be helping as much with weight loss. Atrial flutter status post ablation: No recurrence in over 2 years since his ablation procedure.  Off Eliquis . Chronic back pain: Refilled his methocarbamol  but understands that for future refills he will have to request this to be done by his PCP.  He is currently trying to get a new PCP.  Gave him information for the [facility] internal medicine group.    Medication Adjustments/Labs and Tests Ordered: Current medicines are reviewed at length with the patient today.  Concerns regarding medicines are outlined above.  Orders Placed This Encounter  Procedures   EKG 12-Lead    Meds ordered this encounter  Medications   atorvastatin  (LIPITOR ) 80 MG tablet    Sig: Take 1 tablet (80 mg total) by mouth daily.    Dispense:  90 tablet    Refill:  3   clopidogrel  (PLAVIX ) 75 MG tablet    Sig: Take 1 tablet (75 mg total) by mouth daily.    Dispense:  90 tablet    Refill:  3   methocarbamol  (ROBAXIN ) 500 MG tablet    Sig: Take 1 tablet (500 mg total) by mouth every 8 (eight) hours as needed for muscle spasms.    Dispense:  30 tablet    Refill:  0   ranolazine  (RANEXA ) 500 MG 12 hr tablet    Sig: Take 2 tablets (1,000 mg  total) by mouth 2 (two) times daily.    Dispense:  360 tablet    Refill:  3   metoprolol  succinate (TOPROL -XL) 200 MG 24 hr tablet    Sig: Take 1 tablet (200 mg total) by mouth daily. Take with or immediately following a meal.    Dispense:  90 tablet    Refill:  3     Patient Instructions  Medication Instructions:  Stop Metoprolol  Tartrate  Start Metoprolol   Succinate 200 mg daily *If you need a refill on your cardiac medications before your next appointment, please call your pharmacy*  Lab Work: None ordered If you have labs (blood work) drawn today and your tests are completely normal, you will receive your results only by: MyChart Message (if you have MyChart) OR A paper copy in the mail If you have any lab test that is abnormal or we need to change your treatment, we will call you to review the results.  Testing/Procedures: None ordered  Follow-Up: At [facility], you and your health needs are our priority.  As part of our continuing mission to provide you with exceptional heart care, our providers are all part of one team.  This team includes your primary Cardiologist (physician) and Advanced Practice Providers or APPs (Physician Assistants and Nurse Practitioners) who all work together to provide you with the care you need, when you need it.  Your next appointment:   1 year(s)  Provider:   Jerel Balding, MD    We recommend signing up for the patient portal called MyChart.  Sign up information is provided on this After Visit Summary.  MyChart is used to connect with patients for Virtual Visits (Telemedicine).  Patients are able to view lab/test results, encounter notes, upcoming appointments, etc.  Non-urgent messages can be sent to your provider as well.   To learn more about what you can do with MyChart, go to [email or web address].      Signed, Jerry Balding, MD  11/05/2024 1:51 PM    [facility]

## 2024-11-05 ENCOUNTER — Other Ambulatory Visit (HOSPITAL_COMMUNITY): Payer: Self-pay

## 2024-11-05 ENCOUNTER — Telehealth: Payer: Self-pay

## 2024-11-05 NOTE — Telephone Encounter (Signed)
 Pharmacy Patient Advocate Encounter   Received notification from Pt Calls Messages that prior authorization for Trulicity  0.75mg  is required/requested.   Insurance verification completed.   The patient is insured through [employer].   Per test claim: PA was previously approved on 09/14/2024 through 09/14/2025. Pt last picked up a 3 month supply on 10/19/2024. It is too soon to refill.

## 2024-11-07 ENCOUNTER — Other Ambulatory Visit (HOSPITAL_COMMUNITY): Payer: Self-pay

## 2024-11-07 ENCOUNTER — Telehealth: Payer: Self-pay

## 2024-11-07 NOTE — Telephone Encounter (Signed)
*  Pulm  Pharmacy Patient Advocate Encounter   Received notification from Pt Calls Messages that prior authorization for Breztri  Aerosphere 160-9-4.8MCG/ACT aerosol   is required/requested.   Insurance verification completed.   The patient is insured through [employer].   Per test claim: PA required; PA submitted to above mentioned insurance via Latent Key/confirmation #/EOC [id number] Status is pending

## 2024-11-07 NOTE — Telephone Encounter (Signed)
 Your request has been approved PA Case: [id number], Status: Approved, Coverage Starts on: 11/07/2024 12:00:00 AM, Coverage Ends on: 11/07/2025 12:00:00 AM. Authorization Expiration11/24/2026

## 2024-11-08 ENCOUNTER — Other Ambulatory Visit (HOSPITAL_COMMUNITY): Payer: Self-pay

## 2024-11-08 ENCOUNTER — Telehealth: Payer: Self-pay

## 2024-11-08 MED ORDER — LANTUS SOLOSTAR 100 UNIT/ML ~~LOC~~ SOPN: 25.0000 [IU] | PEN_INJECTOR | Freq: Every day | SUBCUTANEOUS | 6 refills | Status: AC

## 2024-11-08 NOTE — Telephone Encounter (Signed)
 Pharmacy Patient Advocate Encounter   Received notification from Patient Advice Request messages that prior authorization for Lantus  100 unit/ml solostar pen is required/requested.   Insurance verification completed.   The patient is insured through [employer].   Per test claim: The current 90 day co-pay is, $4.  No PA needed at this time. This test claim was processed through [facility]- copay amounts may vary at other pharmacies due to pharmacy/plan contracts, or as the patient moves through the different stages of their insurance plan.     Script is written as a 100 day supply. Insurance will only fill a max of 90 day supply.

## 2024-11-08 NOTE — Telephone Encounter (Signed)
 I called and spoke with the patient and he has been advised via phone about what was going on.   Jerry Shaffer

## 2024-11-08 NOTE — Addendum Note (Signed)
 Addended by: CLEOTILDE ROLIN RAMAN on: 11/08/2024 01:49 PM   Modules accepted: Orders

## 2024-11-26 ENCOUNTER — Emergency Department (HOSPITAL_COMMUNITY)
Admission: EM | Admit: 2024-11-26 | Discharge: 2024-11-26 | Discharge status: Against Medical Advice | Attending: Emergency Medicine | Admitting: Emergency Medicine

## 2024-11-26 ENCOUNTER — Emergency Department (HOSPITAL_COMMUNITY)

## 2024-11-26 DIAGNOSIS — Z7902 Long term (current) use of antithrombotics/antiplatelets: Secondary | ICD-10-CM | POA: Insufficient documentation

## 2024-11-26 DIAGNOSIS — Z7951 Long term (current) use of inhaled steroids: Secondary | ICD-10-CM | POA: Insufficient documentation

## 2024-11-26 DIAGNOSIS — Z7982 Long term (current) use of aspirin: Secondary | ICD-10-CM | POA: Insufficient documentation

## 2024-11-26 DIAGNOSIS — R0602 Shortness of breath: Secondary | ICD-10-CM | POA: Diagnosis not present

## 2024-11-26 DIAGNOSIS — J45909 Unspecified asthma, uncomplicated: Secondary | ICD-10-CM | POA: Insufficient documentation

## 2024-11-26 DIAGNOSIS — Z87891 Personal history of nicotine dependence: Secondary | ICD-10-CM | POA: Insufficient documentation

## 2024-11-26 DIAGNOSIS — Z5329 Procedure and treatment not carried out because of patient's decision for other reasons: Secondary | ICD-10-CM | POA: Insufficient documentation

## 2024-11-26 DIAGNOSIS — Z794 Long term (current) use of insulin: Secondary | ICD-10-CM | POA: Insufficient documentation

## 2024-11-26 MED ORDER — HYDROCOD POLI-CHLORPHE POLI ER 10-8 MG/5ML PO SUER
5.0000 mL | Freq: Once | ORAL | Status: DC
  Filled 2024-11-26: qty 5

## 2024-11-26 MED ORDER — PREDNISONE 20 MG PO TABS
60.0000 mg | ORAL_TABLET | ORAL | Status: AC
  Administered 2024-11-26: 60 mg via ORAL
  Filled 2024-11-26: qty 3

## 2024-11-26 MED ORDER — DEXAMETHASONE SOD PHOSPHATE PF 10 MG/ML IJ SOLN: 10.0000 mg | Freq: Once | INTRAMUSCULAR | Status: DC

## 2024-11-26 MED ORDER — HYDROCOD POLI-CHLORPHE POLI ER 10-8 MG/5ML PO SUER: 5.0000 mL | Freq: Two times a day (BID) | ORAL | 0 refills | Status: AC | PRN

## 2024-11-26 MED ORDER — GUAIFENESIN 100 MG/5ML PO LIQD
5.0000 mL | Freq: Once | ORAL | Status: DC
  Filled 2024-11-26: qty 10

## 2024-11-26 MED ORDER — PREDNISONE 20 MG PO TABS: 40.0000 mg | ORAL_TABLET | Freq: Every day | ORAL | 0 refills | Status: DC

## 2024-11-26 MED ORDER — ALBUTEROL SULFATE (2.5 MG/3ML) 0.083% IN NEBU
2.5000 mg | INHALATION_SOLUTION | Freq: Once | RESPIRATORY_TRACT | Status: AC
  Administered 2024-11-26: 2.5 mg via RESPIRATORY_TRACT
  Filled 2024-11-26: qty 3

## 2024-11-26 NOTE — ED Provider Triage Note (Signed)
 Emergency Medicine Provider Triage Evaluation Note  Jerry Shaffer , a 63 y.o. male  was evaluated in triage.  Pt complains of shortness of breath, associated with an asthma attack.  Patient does take a daily inhaler and reportedly had to use his albuterol  inhaler this morning.  He stated his symptoms began around 5:00 this morning and he was coughing so much he was unable to sleep.  At this time, he states his symptoms have drastically improved, but he is still having some difficulty breathing..  Review of Systems  Positive: Shortness of breath, cough Negative: Fever, chills, body aches, nausea, vomiting  Physical Exam  BP (!) 153/95 (BP Location: Right Arm)   Pulse 91   Temp 97.6 F (36.4 C) (Oral)   Resp (!) 22   SpO2 95%  Gen:   Awake, no distress   Resp:  Normal effort, expiratory wheezing noted, breath sounds diminished MSK:   Moves extremities without difficulty    Medical Decision Making  Medically screening exam initiated at 10:51 AM.  Appropriate orders placed.  Randine DELENA Mace was informed that the remainder of the evaluation will be completed by another provider, this initial triage assessment does not replace that evaluation, and the importance of remaining in the ED until their evaluation is complete.  Basic labs, chest x-ray, EKG ordered.  Patient is able to speak in full sentences without difficulty.   Torrence Marry RAMAN, PA-C 11/26/24 1053

## 2024-11-26 NOTE — ED Triage Notes (Signed)
 Patient in today reporting ongoing asthma attack, home meds with no relief.

## 2024-11-26 NOTE — ED Notes (Signed)
 I walked into pt room to inform him Tussionex was being send down, however pt could not be located in room, ED bathroom, pt left department.

## 2024-11-26 NOTE — ED Provider Notes (Signed)
 [place] EMERGENCY DEPARTMENT AT [facility] Provider Note   CSN: [id number] Arrival date & time: 11/26/24  1014     Patient presents with: Asthma and Shortness of Breath   Jerry Shaffer is a 63 y.o. male.   HPI Former smoker with a history of asthma, prior bronchitis presents with 2 days of shortness of breath.  He notes that during his time he said cough, sore throat and secondary cough, with occasional hemoptysis as well. No syncope, persistent chest pain, and after he emptied his albuterol  inhaler yesterday he feels somewhat better.  However, with ongoing symptoms he presents for evaluation.     Prior to Admission medications  Medication Sig Start Date End Date Taking? Authorizing Provider  chlorpheniramine-HYDROcodone  (TUSSIONEX) 10-8 MG/5ML Take 5 mLs by mouth every 12 (twelve) hours as needed for cough. 11/26/24  Yes Garrick Charleston, MD  predniSONE  (DELTASONE ) 20 MG tablet Take 2 tablets (40 mg total) by mouth daily with breakfast. For the next four days 11/26/24  Yes Garrick Charleston, MD  albuterol  (VENTOLIN  HFA) 108 (90 Base) MCG/ACT inhaler INHALE 1 TO 2 PUFFS INTO THE LUNGS EVERY 6 HOURS AS NEEDED FOR WHEEZING OR SHORTNESS OF BREATH 09/06/24   Croitoru, Mihai, MD  aspirin  EC 81 MG tablet Take 1 tablet (81 mg total) by mouth daily. Swallow whole. 08/21/23   Croitoru, Mihai, MD  atorvastatin  (LIPITOR ) 80 MG tablet Take 1 tablet (80 mg total) by mouth daily. 11/04/24   Croitoru, Mihai, MD  budesonide -glycopyrrolate-formoterol  (BREZTRI  AEROSPHERE) 160-9-4.8 MCG/ACT AERO inhaler Inhale 2 puffs into the lungs in the morning and at bedtime. 10/31/24   Hunsucker, Donnice SAUNDERS, MD  clopidogrel  (PLAVIX ) 75 MG tablet Take 1 tablet (75 mg total) by mouth daily. 11/04/24   Croitoru, Mihai, MD  glipiZIDE  (GLUCOTROL ) 5 MG tablet Take 1 tablet (5 mg total) by mouth 2 (two) times daily before a meal. 11/02/24   Trixie File, MD  insulin  glargine (LANTUS  SOLOSTAR) 100 UNIT/ML  Solostar Pen Inject 25-30 Units into the skin at bedtime. 11/08/24   Trixie File, MD  Insulin  Pen Needle 32G X 4 MM MISC Use 1x a day 11/02/24   Trixie File, MD  metFORMIN  (GLUCOPHAGE -XR) 500 MG 24 hr tablet Take 1000mg  2x a day 09/30/24   Trixie File, MD  methocarbamol  (ROBAXIN ) 500 MG tablet Take 1 tablet (500 mg total) by mouth every 8 (eight) hours as needed for muscle spasms. 11/04/24   Croitoru, Mihai, MD  metoprolol  succinate (TOPROL -XL) 200 MG 24 hr tablet Take 1 tablet (200 mg total) by mouth daily. Take with or immediately following a meal. 11/04/24   Croitoru, Mihai, MD  nitroGLYCERIN  (NITROSTAT ) 0.4 MG SL tablet Place 1 tablet (0.4 mg total) under the tongue every 5 (five) minutes as needed for chest pain. 12/08/23 03/25/25  Jerilynn Lamarr HERO, NP  OVER THE COUNTER MEDICATION Take 1-2 packets by mouth daily. [employer] Extra Strength Headache Powder    [provider]  pantoprazole  (PROTONIX ) 40 MG tablet Take 1 tablet (40 mg total) by mouth daily. 02/26/24   Croitoru, Mihai, MD  ranolazine  (RANEXA ) 500 MG 12 hr tablet Take 2 tablets (1,000 mg total) by mouth 2 (two) times daily. 11/04/24   Croitoru, Jerel, MD  Spacer/Aero-Holding Raguel DEVI Use with inhalers 10/31/24   Hunsucker, Donnice SAUNDERS, MD  TRULICITY  0.75 MG/0.5ML SOAJ Inject 0.75 mg into the skin once a week. 10/17/24   Trixie File, MD    Allergies: Bee venom, Penicillins, Procaine, Isosorbide , and Tape  Review of Systems  Updated Vital Signs BP (!) 153/95 (BP Location: Right Arm)   Pulse 91   Temp 97.6 F (36.4 C) (Oral)   Resp (!) 22   SpO2 95%   Physical Exam Vitals and nursing note reviewed.  Constitutional:      General: He is not in acute distress.    Appearance: He is well-developed.  HENT:     Head: Normocephalic and atraumatic.     Mouth/Throat:   Eyes:     Conjunctiva/sclera: Conjunctivae normal.  Cardiovascular:     Rate and Rhythm: Normal rate and regular rhythm.   Pulmonary:     Effort: Pulmonary effort is normal. No respiratory distress.     Breath sounds: No stridor.  Abdominal:     General: There is no distension.  Skin:    General: Skin is warm and dry.  Neurological:     Mental Status: He is alert and oriented to person, place, and time.     (all labs ordered are listed, but only abnormal results are displayed) Labs Reviewed - No data to display   EKG: EKG Interpretation Date/Time:  Saturday November 26 2024 11:01:00 EST Ventricular Rate:  88 PR Interval:  155 QRS Duration:  89 QT Interval:  375 QTC Calculation: 454 R Axis:   24  Text Interpretation: Sinus rhythm ST-t wave abnormality No significant change since last tracing Confirmed by Garrick Charleston [phone]) on 11/26/2024 2:17:22 PM  Radiology: DG Chest 2 View Result Date: 11/26/2024 CLINICAL DATA:  cough EXAM: CHEST - 2 VIEW COMPARISON:  March 24, 2024 FINDINGS: The cardiomediastinal silhouette is unchanged in contour. No pleural effusion. No pneumothorax. No acute pleuroparenchymal abnormality. Visualized abdomen is unremarkable. Multilevel degenerative changes of the thoracic spine. IMPRESSION: No acute cardiopulmonary abnormality. Electronically Signed   By: Corean Salter M.D.   On: 11/26/2024 11:05     Procedures   Medications Ordered in the ED  chlorpheniramine-HYDROcodone  (TUSSIONEX) 10-8 MG/5ML suspension 5 mL (has no administration in time range)  albuterol  (PROVENTIL ) (2.5 MG/3ML) 0.083% nebulizer solution 2.5 mg (2.5 mg Nebulization Given 11/26/24 1431)  predniSONE  (DELTASONE ) tablet 60 mg (60 mg Oral Given 11/26/24 1430)                                    Medical Decision Making Adult male with a history of bronchitis, asthma, former smoker dents with cough, congestion, hemoptysis in spite of using albuterol  at home  Broad differential including pneumonia, asthma, bronchitis, bacteremia, sepsis, COVID, influenza. Pulse ox 99% room air normal cardiac 80  sinus normal  Amount and/or Complexity of Data Reviewed Labs:  Decision-making details documented in ED Course. Radiology: ordered and independent interpretation performed. Decision-making details documented in ED Course. ECG/medicine tests: independent interpretation performed. Decision-making details documented in ED Course.  Risk OTC drugs. Prescription drug management.   3:31 PM Patient proved after breathing treatment, lung sounds now clear.  We reviewed his x-ray, no pneumonia, patient declined additional blood work given his reassuring vital signs this is reasonable. Patient started on steroids, will continue bronchodilators, requested Tussidex and this was accommodated.  Patient discharged in stable condition.     Final diagnoses:  Shortness of breath    ED Discharge Orders          Ordered    chlorpheniramine-HYDROcodone  (TUSSIONEX) 10-8 MG/5ML  Every 12 hours PRN        11/26/24 1530  predniSONE  (DELTASONE ) 20 MG tablet  Daily with breakfast        11/26/24 1530               Garrick Charleston, MD 11/26/24 1531

## 2024-11-26 NOTE — Discharge Instructions (Addendum)
 Take all medication as prescribed and be sure to use your albuterol , every 4 hours for the next 2 days.  You may then reduce its usage to an as needed basis.

## 2024-12-15 ENCOUNTER — Other Ambulatory Visit: Payer: Self-pay

## 2024-12-15 ENCOUNTER — Emergency Department (HOSPITAL_COMMUNITY)

## 2024-12-15 ENCOUNTER — Encounter (HOSPITAL_COMMUNITY): Payer: Self-pay | Admitting: Emergency Medicine

## 2024-12-15 ENCOUNTER — Emergency Department (HOSPITAL_COMMUNITY)
Admission: EM | Admit: 2024-12-15 | Discharge: 2024-12-16 | Disposition: A | Discharge status: Home / Self Care | Source: Home / Self Care | Attending: Emergency Medicine | Admitting: Emergency Medicine

## 2024-12-15 DIAGNOSIS — I10 Essential (primary) hypertension: Secondary | ICD-10-CM | POA: Insufficient documentation

## 2024-12-15 DIAGNOSIS — R739 Hyperglycemia, unspecified: Secondary | ICD-10-CM

## 2024-12-15 DIAGNOSIS — Z79899 Other long term (current) drug therapy: Secondary | ICD-10-CM | POA: Insufficient documentation

## 2024-12-15 DIAGNOSIS — Z794 Long term (current) use of insulin: Secondary | ICD-10-CM | POA: Insufficient documentation

## 2024-12-15 DIAGNOSIS — R197 Diarrhea, unspecified: Secondary | ICD-10-CM | POA: Insufficient documentation

## 2024-12-15 DIAGNOSIS — Z7902 Long term (current) use of antithrombotics/antiplatelets: Secondary | ICD-10-CM | POA: Insufficient documentation

## 2024-12-15 DIAGNOSIS — Z7984 Long term (current) use of oral hypoglycemic drugs: Secondary | ICD-10-CM | POA: Diagnosis not present

## 2024-12-15 DIAGNOSIS — D72829 Elevated white blood cell count, unspecified: Secondary | ICD-10-CM | POA: Insufficient documentation

## 2024-12-15 DIAGNOSIS — I2510 Atherosclerotic heart disease of native coronary artery without angina pectoris: Secondary | ICD-10-CM | POA: Diagnosis not present

## 2024-12-15 DIAGNOSIS — Z7982 Long term (current) use of aspirin: Secondary | ICD-10-CM | POA: Diagnosis not present

## 2024-12-15 DIAGNOSIS — R112 Nausea with vomiting, unspecified: Secondary | ICD-10-CM | POA: Insufficient documentation

## 2024-12-15 DIAGNOSIS — Z8673 Personal history of transient ischemic attack (TIA), and cerebral infarction without residual deficits: Secondary | ICD-10-CM | POA: Insufficient documentation

## 2024-12-15 DIAGNOSIS — E1165 Type 2 diabetes mellitus with hyperglycemia: Secondary | ICD-10-CM | POA: Insufficient documentation

## 2024-12-15 LAB — COMPREHENSIVE METABOLIC PANEL WITH GFR
ALT: 16 U/L (ref 0–44)
AST: 16 U/L (ref 15–41)
Albumin: 4.3 g/dL (ref 3.5–5.0)
Alkaline Phosphatase: 109 U/L (ref 38–126)
Anion gap: 19 — ABNORMAL HIGH (ref 5–15)
BUN: 17 mg/dL (ref 8–23)
CO2: 17 mmol/L — ABNORMAL LOW (ref 22–32)
Calcium: 9.3 mg/dL (ref 8.9–10.3)
Chloride: 97 mmol/L — ABNORMAL LOW (ref 98–111)
Creatinine, Ser: 1.1 mg/dL (ref 0.61–1.24)
GFR, Estimated: >60 mL/min
Glucose, Bld: 338 mg/dL — ABNORMAL HIGH (ref 70–99)
Potassium: 3.9 mmol/L (ref 3.5–5.1)
Sodium: 133 mmol/L — ABNORMAL LOW (ref 135–145)
Total Bilirubin: 0.8 mg/dL (ref 0.0–1.2)
Total Protein: 6.9 g/dL (ref 6.5–8.1)

## 2024-12-15 LAB — PROTIME-INR
INR: 1.1 (ref 0.8–1.2)
Prothrombin Time: 14.3 s (ref 11.4–15.2)

## 2024-12-15 LAB — CBC WITH DIFFERENTIAL/PLATELET
Abs Immature Granulocytes: 0.04 K/uL (ref 0.00–0.07)
Basophils Absolute: 0.1 K/uL (ref 0.0–0.1)
Basophils Relative: 0 %
Eosinophils Absolute: 0 K/uL (ref 0.0–0.5)
Eosinophils Relative: 0 %
HCT: 47.2 % (ref 39.0–52.0)
Hemoglobin: 15.4 g/dL (ref 13.0–17.0)
Immature Granulocytes: 0 %
Lymphocytes Relative: 8 %
Lymphs Abs: 1.1 K/uL (ref 0.7–4.0)
MCH: 28.4 pg (ref 26.0–34.0)
MCHC: 32.6 g/dL (ref 30.0–36.0)
MCV: 87.1 fL (ref 80.0–100.0)
Monocytes Absolute: 0.5 K/uL (ref 0.1–1.0)
Monocytes Relative: 4 %
Neutro Abs: 11.9 K/uL — ABNORMAL HIGH (ref 1.7–7.7)
Neutrophils Relative %: 88 %
Platelets: 367 K/uL (ref 150–400)
RBC: 5.42 MIL/uL (ref 4.22–5.81)
RDW: 13.7 % (ref 11.5–15.5)
WBC: 13.5 K/uL — ABNORMAL HIGH (ref 4.0–10.5)
nRBC: 0 % (ref 0.0–0.2)

## 2024-12-15 LAB — BLOOD GAS, VENOUS
Acid-Base Excess: 0.5 mmol/L (ref 0.0–2.0)
Bicarbonate: 23.1 mmol/L (ref 20.0–28.0)
Drawn by: 66539
O2 Saturation: 95 %
Patient temperature: 37
pCO2, Ven: 31 mmHg — ABNORMAL LOW (ref 44–60)
pH, Ven: 7.48 — ABNORMAL HIGH (ref 7.25–7.43)
pO2, Ven: 69 mmHg — ABNORMAL HIGH (ref 32–45)

## 2024-12-15 LAB — RESP PANEL BY RT-PCR (RSV, FLU A&B, COVID)  RVPGX2
Influenza A by PCR: NEGATIVE
Influenza B by PCR: NEGATIVE
Resp Syncytial Virus by PCR: NEGATIVE
SARS Coronavirus 2 by RT PCR: NEGATIVE

## 2024-12-15 LAB — CBG MONITORING, ED: Glucose-Capillary: 275 mg/dL — ABNORMAL HIGH (ref 70–99)

## 2024-12-15 LAB — TROPONIN T, HIGH SENSITIVITY: Troponin T High Sensitivity: 18 ng/L (ref 0–19)

## 2024-12-15 MED ORDER — ONDANSETRON HCL 4 MG/2ML IJ SOLN
4.0000 mg | Freq: Once | INTRAMUSCULAR | Status: AC
  Administered 2024-12-15: 4 mg via INTRAVENOUS
  Filled 2024-12-15: qty 2

## 2024-12-15 MED ORDER — INSULIN ASPART 100 UNIT/ML IJ SOLN
8.0000 [IU] | Freq: Once | INTRAMUSCULAR | Status: AC
  Administered 2024-12-15: 8 [IU] via SUBCUTANEOUS
  Filled 2024-12-15: qty 8

## 2024-12-15 MED ORDER — LACTATED RINGERS IV BOLUS
1000.0000 mL | Freq: Once | INTRAVENOUS | Status: AC
  Administered 2024-12-15: 1000 mL via INTRAVENOUS

## 2024-12-15 NOTE — ED Triage Notes (Signed)
" °  Patient comes in with URI symptoms that have been going on for 2 days.  Endorses N/V with last emesis episode about an hour ago.  Headache for 2 days and takes Imitrex.  Pain 7/10, aching.   "

## 2024-12-15 NOTE — ED Notes (Signed)
" °  Patient endorsing N/V and offered zofran  ODT in triage.  Patient refused stating it does not help. "

## 2024-12-15 NOTE — ED Provider Notes (Signed)
 " [place] EMERGENCY DEPARTMENT AT [facility] Provider Note   CSN: [id number] Arrival date & time: 12/15/24  2112     Patient presents with: Cough, Nasal Congestion, and Emesis   Jerry Shaffer is a 64 y.o. male with history of NSTEMI, atrial flutter, bipolar 1 disorder, CAD, diabetes, emphysema, GERD, hypertension, Wigraine's, shortness of breath, stroke, TIA, anticoagulated.  Patient presents to ED complaining of cough, nausea, vomiting, inability to tolerate oral intake, dizziness.  Patient reports that last week he was seen in the ED for nausea, vomiting and shortness of breath.  Reports that he was discharged after having nausea controlled and states he was doing fine until Tuesday when he began to experience nausea and vomiting.  Reports that he had just eaten chicken tenders from [employer] and he wonders if this has anything to do with it.  Reports that he has been throwing up and unable to tolerate anything since Tuesday by mouth.  He denies any fevers at home.  Denies any abdominal pain or chest pain.  Does not Dors shortness of breath noted earlier tonight when he was seen by paramedics but states upon arrival back to ED shortness of breath has resolved.  Denies any sore throat or bodyaches or chills.  Denies known sick contacts.  Denies dysuria.  Also endorsing dizziness noted earlier today which she states has been progressively worsening.  Reports that initially the dizziness only occurred when he went from sitting to standing positions but now dizziness seems to be consistent even when he is lying in the bed.  States that he feels as if the room is spinning.  Reports that when he arrived to ED he noted the room to be spinning when he had a hard time getting out of the car.  Reports history of CVA and states that his dizziness is first thing he noted.  Reports deficits from CVA or memory issues.  Denies any neck pain.  Denies headache.  Reports compliance on blood  thinners.   Cough Emesis Associated symptoms: cough and diarrhea        Prior to Admission medications  Medication Sig Start Date End Date Taking? Authorizing Provider  promethazine  (PHENERGAN ) 25 MG tablet Take 1 tablet (25 mg total) by mouth every 6 (six) hours as needed for nausea or vomiting. 12/16/24  Yes Ruthell Lonni FALCON, PA-C  albuterol  (VENTOLIN  HFA) 108 (90 Base) MCG/ACT inhaler INHALE 1 TO 2 PUFFS INTO THE LUNGS EVERY 6 HOURS AS NEEDED FOR WHEEZING OR SHORTNESS OF BREATH 09/06/24   Croitoru, Mihai, MD  aspirin  EC 81 MG tablet Take 1 tablet (81 mg total) by mouth daily. Swallow whole. 08/21/23   Croitoru, Mihai, MD  atorvastatin  (LIPITOR ) 80 MG tablet Take 1 tablet (80 mg total) by mouth daily. 11/04/24   Croitoru, Mihai, MD  budesonide -glycopyrrolate-formoterol  (BREZTRI  AEROSPHERE) 160-9-4.8 MCG/ACT AERO inhaler Inhale 2 puffs into the lungs in the morning and at bedtime. 10/31/24   Hunsucker, Donnice SAUNDERS, MD  chlorpheniramine-HYDROcodone  (TUSSIONEX) 10-8 MG/5ML Take 5 mLs by mouth every 12 (twelve) hours as needed for cough. 11/26/24   Garrick Charleston, MD  clopidogrel  (PLAVIX ) 75 MG tablet Take 1 tablet (75 mg total) by mouth daily. 11/04/24   Croitoru, Mihai, MD  glipiZIDE  (GLUCOTROL ) 5 MG tablet Take 1 tablet (5 mg total) by mouth 2 (two) times daily before a meal. 11/02/24   Trixie File, MD  insulin  glargine (LANTUS  SOLOSTAR) 100 UNIT/ML Solostar Pen Inject 25-30 Units into the skin at bedtime.  11/08/24   Trixie File, MD  Insulin  Pen Needle 32G X 4 MM MISC Use 1x a day 11/02/24   Trixie File, MD  metFORMIN  (GLUCOPHAGE -XR) 500 MG 24 hr tablet Take 1000mg  2x a day 09/30/24   Trixie File, MD  methocarbamol  (ROBAXIN ) 500 MG tablet Take 1 tablet (500 mg total) by mouth every 8 (eight) hours as needed for muscle spasms. 11/04/24   Croitoru, Mihai, MD  metoprolol  succinate (TOPROL -XL) 200 MG 24 hr tablet Take 1 tablet (200 mg total) by mouth daily. Take with or  immediately following a meal. 11/04/24   Croitoru, Mihai, MD  nitroGLYCERIN  (NITROSTAT ) 0.4 MG SL tablet Place 1 tablet (0.4 mg total) under the tongue every 5 (five) minutes as needed for chest pain. 12/08/23 03/25/25  Jerilynn Lamarr HERO, NP  OVER THE COUNTER MEDICATION Take 1-2 packets by mouth daily. [employer] Extra Strength Headache Powder    [provider]  pantoprazole  (PROTONIX ) 40 MG tablet Take 1 tablet (40 mg total) by mouth daily. 02/26/24   Croitoru, Mihai, MD  predniSONE  (DELTASONE ) 20 MG tablet Take 2 tablets (40 mg total) by mouth daily with breakfast. For the next four days 11/26/24   Garrick Charleston, MD  ranolazine  (RANEXA ) 500 MG 12 hr tablet Take 2 tablets (1,000 mg total) by mouth 2 (two) times daily. 11/04/24   Croitoru, Jerel, MD  Spacer/Aero-Holding Raguel DEVI Use with inhalers 10/31/24   Hunsucker, Donnice SAUNDERS, MD  TRULICITY  0.75 MG/0.5ML SOAJ Inject 0.75 mg into the skin once a week. 10/17/24   Trixie File, MD    Allergies: Bee venom, Penicillins, Procaine, Isosorbide , and Tape    Review of Systems  Respiratory:  Positive for cough.   Gastrointestinal:  Positive for diarrhea, nausea and vomiting.  All other systems reviewed and are negative.   Updated Vital Signs BP (!) 181/102   Pulse 81   Temp 98.4 F (36.9 C)   Resp 16   Ht 5' 7 (1.702 m)   Wt 118.4 kg   SpO2 100%   BMI 40.88 kg/m   Physical Exam Vitals and nursing note reviewed.  Constitutional:      General: He is not in acute distress.    Appearance: He is well-developed.  HENT:     Head: Normocephalic and atraumatic.  Eyes:     Conjunctiva/sclera: Conjunctivae normal.  Cardiovascular:     Rate and Rhythm: Normal rate and regular rhythm.     Heart sounds: No murmur heard. Pulmonary:     Effort: Pulmonary effort is normal. No respiratory distress.     Breath sounds: Normal breath sounds. No wheezing.  Abdominal:     Palpations: Abdomen is soft.     Tenderness: There is no  abdominal tenderness.  Musculoskeletal:        General: No swelling.     Cervical back: Neck supple.     Right lower leg: No edema.     Left lower leg: No edema.  Skin:    General: Skin is warm and dry.     Capillary Refill: Capillary refill takes less than 2 seconds.  Neurological:     Mental Status: He is alert and oriented to person, place, and time. Mental status is at baseline.  Psychiatric:        Mood and Affect: Mood normal.     (all labs ordered are listed, but only abnormal results are displayed) Labs Reviewed  CBC WITH DIFFERENTIAL/PLATELET - Abnormal; Notable for the following components:  Result Value   WBC 13.5 (*)    Neutro Abs 11.9 (*)    All other components within normal limits  COMPREHENSIVE METABOLIC PANEL WITH GFR - Abnormal; Notable for the following components:   Sodium 133 (*)    Chloride 97 (*)    CO2 17 (*)    Glucose, Bld 338 (*)    Anion gap 19 (*)    All other components within normal limits  BETA-HYDROXYBUTYRIC ACID - Abnormal; Notable for the following components:   Beta-Hydroxybutyric Acid 1.75 (*)    All other components within normal limits  BLOOD GAS, VENOUS - Abnormal; Notable for the following components:   pH, Ven 7.48 (*)    pCO2, Ven 31 (*)    pO2, Ven 69 (*)    All other components within normal limits  COMPREHENSIVE METABOLIC PANEL WITH GFR - Abnormal; Notable for the following components:   Glucose, Bld 156 (*)    Total Protein 6.4 (*)    All other components within normal limits  CBG MONITORING, ED - Abnormal; Notable for the following components:   Glucose-Capillary 275 (*)    All other components within normal limits  RESP PANEL BY RT-PCR (RSV, FLU A&B, COVID)  RVPGX2  PROTIME-INR  URINALYSIS, ROUTINE W REFLEX MICROSCOPIC  TROPONIN T, HIGH SENSITIVITY    EKG: EKG Interpretation Date/Time:  Thursday December 15 2024 23:06:38 EST Ventricular Rate:  94 PR Interval:  159 QRS Duration:  91 QT Interval:  375 QTC  Calculation: 469 R Axis:   19  Text Interpretation: Sinus rhythm Minimal ST depression Confirmed by Ula Barter [phone]) on 12/15/2024 11:22:35 PM  Radiology: CT ANGIO HEAD NECK W WO CM Result Date: 12/16/2024 EXAM: CTA Head and Neck with Intravenous Contrast. CT Head without Contrast. CLINICAL HISTORY: Neuro deficit, acute, stroke suspected; dizziness, hx of the same with multiple cvas. TECHNIQUE: Axial CTA images of the head and neck performed with and without intravenous contrast. MIP reconstructed images were created and reviewed. Axial computed tomography images of the head/brain performed without intravenous contrast. Note: Per PQRS, the description of internal carotid artery percent stenosis, including 0 percent or normal exam, is based on [employer] American Symptomatic Carotid Endarterectomy Trial (NASCET) criteria. Dose reduction technique was used including one or more of the following: automated exposure control, adjustment of mA and kV according to patient size, and/or iterative reconstruction. CONTRAST: Without and with intravenous contrast (75 mL iohexol  (OMNIPAQUE ) 350 MG/ML injection). COMPARISON: None provided. FINDINGS: CT HEAD: BRAIN: No acute intraparenchymal hemorrhage. No mass lesion. No CT evidence for acute territorial infarct. No midline shift or extra-axial collection. VENTRICLES: No hydrocephalus. ORBITS: The orbits are unremarkable. SINUSES AND MASTOIDS: The paranasal sinuses and mastoid air cells are clear. CTA NECK: COMMON CAROTID ARTERIES: Mild atherosclerosis at both carotid bifurcations. No hemodynamically significant stenosis. No dissection or occlusion. INTERNAL CAROTID ARTERIES: Mild atherosclerosis at both carotid bifurcations. No hemodynamically significant stenosis of the internal carotid arteries. No stenosis by NASCET criteria. No dissection or occlusion. VERTEBRAL ARTERIES: The left vertebral artery originates independently from the aortic arch. Mildly right dominant  vertebral arteries. No significant stenosis. No dissection or occlusion. CTA HEAD: ANTERIOR CEREBRAL ARTERIES: No significant stenosis. No occlusion. No aneurysm. MIDDLE CEREBRAL ARTERIES: No significant stenosis. No occlusion. No aneurysm. POSTERIOR CEREBRAL ARTERIES: No significant stenosis. No occlusion. No aneurysm. BASILAR ARTERY: No significant stenosis. No occlusion. No aneurysm. OTHER: Bilateral atherosclerotic calcification of the carotid siphons without hemodynamically significant stenosis. Mild calcific aortic atherosclerosis. SOFT TISSUES: No acute  finding. No masses or lymphadenopathy. BONES: No acute osseous abnormality. IMPRESSION: 1. No acute intracranial hemorrhage or acute ischemic change. 2. No emergent large vessel occlusion, dissection or aneurysm. 3. Mild atherosclerosis at both carotid bifurcations and bilateral atherosclerotic calcification of the carotid siphons without hemodynamically significant stenosis. Electronically signed by: Franky Stanford MD 12/16/2024 01:27 AM EST RP Workstation: [id number]   DG Chest Portable 1 View Result Date: 12/15/2024 EXAM: 1 VIEW(S) XRAY OF THE CHEST 12/15/2024 11:03:00 PM COMPARISON: 11/26/2024 CLINICAL HISTORY: sob FINDINGS: LUNGS AND PLEURA: No focal pulmonary opacity. No pleural effusion. No pneumothorax. HEART AND MEDIASTINUM: No acute abnormality of the cardiac and mediastinal silhouettes. BONES AND SOFT TISSUES: No acute osseous abnormality. IMPRESSION: 1. No acute cardiopulmonary process. Electronically signed by: Greig Pique MD 12/15/2024 11:41 PM EST RP Workstation: [id number]    Procedures   Medications Ordered in the ED  lactated ringers  bolus 1,000 mL (0 mLs Intravenous Stopped 12/16/24 0246)  insulin  aspart (novoLOG ) injection 8 Units (8 Units Subcutaneous Given 12/15/24 2315)  ondansetron  (ZOFRAN ) injection 4 mg (4 mg Intravenous Given 12/15/24 2315)  iohexol  (OMNIPAQUE ) 350 MG/ML injection 75 mL (75 mLs Intravenous Contrast Given 12/16/24  0059)  promethazine  (PHENERGAN ) tablet 12.5 mg (12.5 mg Oral Given 12/16/24 0306)    Clinical Course as of 12/16/24 0502  Fri Dec 16, 2024  0056 DG Chest Portable 1 View [CG]    Clinical Course User Index [CG] Ruthell Lonni FALCON, PA-C   Medical Decision Making Amount and/or Complexity of Data Reviewed Labs: ordered. Radiology: ordered. Decision-making details documented in ED Course. ECG/medicine tests: ordered.  Risk Prescription drug management.   This is a 64 year old male presenting to the ED out of concern of nausea, vomiting with diarrhea, dizziness.  On exam, HD stable.  Lung sounds are clear bilaterally, no hypoxia.  Abdomen soft and compressible.  Neurological examination at baseline without focal neurodeficits.  No edema to bilateral lower extremities.  Overall nontoxic in appearance.  Will assess utilizing CBC, CMP, VBG, troponin, viral panel, urinalysis, PT/INR, beta-hydroxybutyrate acid, chest x-ray, EKG, CTA head and neck.  CBC here with leukocytosis to 13.5 in the setting of diarrheal illness with nausea and vomiting.  Initial metabolic panel reveals sodium 866, glucose 338, anion gap 19.  VBG with elevated venous pH 7.48.  PT/INR is WNL.  Beta-hydroxybutyrate Cacit of 1.75.  Viral panel negative for all.  Troponin 18 however patient denies any chest pain.  Chest x-ray unremarkable.  EKG is nonischemic  CTA head neck shows no evidence of LVO or other acute pathology.  Patient was given 1 L of fluid, 8 units of insulin .  Patient passed p.o. fluid challenge and metabolic panel was rechecked.  Patient metabolic panel has normalized.  His glucose is currently 156 and his anion gap is 11.  He has had no nausea or vomiting here.  He is motivated to go home.  Suspect patient elevated anion gap could be secondary to starvation ketosis in the setting of hyperglycemia.  Patient also reports that dizziness is subsided after IV fluid administration.  At this time patient will be  discharged home with Phenergan  which he has requested.  He was advised to follow-up outpatient with his PCP.  He was given very strict return precautions to include increased nausea or vomiting which she voiced understanding with.  Patient was advised if he goes home and begins to throw up or have any other acute issues to return to the ED for further care and he voiced understanding.  Final diagnoses:  Nausea vomiting and diarrhea  Hyperglycemia    ED Discharge Orders          Ordered    promethazine  (PHENERGAN ) 25 MG tablet  Every 6 hours PRN        12/16/24 0501               Ruthell Lonni FALCON, PA-C 12/16/24 0502    Ula Prentice SAUNDERS, MD 12/17/24 0004  "

## 2024-12-16 ENCOUNTER — Emergency Department (HOSPITAL_COMMUNITY)

## 2024-12-16 ENCOUNTER — Encounter (HOSPITAL_COMMUNITY): Payer: Self-pay

## 2024-12-16 LAB — COMPREHENSIVE METABOLIC PANEL WITH GFR
ALT: 14 U/L (ref 0–44)
AST: 15 U/L (ref 15–41)
Albumin: 4 g/dL (ref 3.5–5.0)
Alkaline Phosphatase: 91 U/L (ref 38–126)
Anion gap: 11 (ref 5–15)
BUN: 15 mg/dL (ref 8–23)
CO2: 28 mmol/L (ref 22–32)
Calcium: 9.2 mg/dL (ref 8.9–10.3)
Chloride: 100 mmol/L (ref 98–111)
Creatinine, Ser: 0.89 mg/dL (ref 0.61–1.24)
GFR, Estimated: >60 mL/min
Glucose, Bld: 156 mg/dL — ABNORMAL HIGH (ref 70–99)
Potassium: 3.7 mmol/L (ref 3.5–5.1)
Sodium: 139 mmol/L (ref 135–145)
Total Bilirubin: 0.5 mg/dL (ref 0.0–1.2)
Total Protein: 6.4 g/dL — ABNORMAL LOW (ref 6.5–8.1)

## 2024-12-16 LAB — BETA-HYDROXYBUTYRIC ACID: Beta-Hydroxybutyric Acid: 1.75 mmol/L — ABNORMAL HIGH (ref 0.05–0.27)

## 2024-12-16 MED ORDER — IOHEXOL 350 MG/ML SOLN
75.0000 mL | Freq: Once | INTRAVENOUS | Status: AC | PRN
  Administered 2024-12-16: 75 mL via INTRAVENOUS

## 2024-12-16 MED ORDER — PROMETHAZINE HCL 25 MG PO TABS
12.5000 mg | ORAL_TABLET | Freq: Once | ORAL | Status: AC
  Administered 2024-12-16: 12.5 mg via ORAL
  Filled 2024-12-16: qty 1

## 2024-12-16 MED ORDER — PROMETHAZINE HCL 25 MG PO TABS: 25.0000 mg | ORAL_TABLET | Freq: Four times a day (QID) | ORAL | 0 refills | Status: DC | PRN

## 2024-12-16 NOTE — ED Notes (Signed)
Pt still unable to give urine sample at this time.

## 2024-12-16 NOTE — Discharge Instructions (Addendum)
 As we discussed, please begin taking Phenergan  every 6 hours as needed for nausea.  Please continue taking all diabetic medications as prescribed.  Please follow-up with your PCP for further care.  If you have any return of nausea, vomiting or other symptoms please return to the ED for further care.

## 2025-01-12 ENCOUNTER — Emergency Department (HOSPITAL_COMMUNITY)

## 2025-01-12 ENCOUNTER — Observation Stay (HOSPITAL_COMMUNITY): Admission: EM | Admit: 2025-01-12 | Discharge: 2025-01-13 | Disposition: A | Discharge status: Home / Self Care

## 2025-01-12 ENCOUNTER — Encounter (HOSPITAL_COMMUNITY): Payer: Self-pay

## 2025-01-12 DIAGNOSIS — Z7982 Long term (current) use of aspirin: Secondary | ICD-10-CM | POA: Insufficient documentation

## 2025-01-12 DIAGNOSIS — Z87891 Personal history of nicotine dependence: Secondary | ICD-10-CM | POA: Insufficient documentation

## 2025-01-12 DIAGNOSIS — E785 Hyperlipidemia, unspecified: Secondary | ICD-10-CM | POA: Diagnosis present

## 2025-01-12 DIAGNOSIS — K219 Gastro-esophageal reflux disease without esophagitis: Secondary | ICD-10-CM | POA: Diagnosis present

## 2025-01-12 DIAGNOSIS — I110 Hypertensive heart disease with heart failure: Secondary | ICD-10-CM | POA: Insufficient documentation

## 2025-01-12 DIAGNOSIS — I2510 Atherosclerotic heart disease of native coronary artery without angina pectoris: Secondary | ICD-10-CM | POA: Diagnosis not present

## 2025-01-12 DIAGNOSIS — E119 Type 2 diabetes mellitus without complications: Secondary | ICD-10-CM | POA: Diagnosis not present

## 2025-01-12 DIAGNOSIS — Z8673 Personal history of transient ischemic attack (TIA), and cerebral infarction without residual deficits: Secondary | ICD-10-CM | POA: Diagnosis not present

## 2025-01-12 DIAGNOSIS — Z955 Presence of coronary angioplasty implant and graft: Secondary | ICD-10-CM | POA: Insufficient documentation

## 2025-01-12 DIAGNOSIS — Z9861 Coronary angioplasty status: Secondary | ICD-10-CM | POA: Diagnosis not present

## 2025-01-12 DIAGNOSIS — I480 Paroxysmal atrial fibrillation: Secondary | ICD-10-CM | POA: Diagnosis not present

## 2025-01-12 DIAGNOSIS — Z7902 Long term (current) use of antithrombotics/antiplatelets: Secondary | ICD-10-CM | POA: Insufficient documentation

## 2025-01-12 DIAGNOSIS — E669 Obesity, unspecified: Secondary | ICD-10-CM

## 2025-01-12 DIAGNOSIS — Z794 Long term (current) use of insulin: Secondary | ICD-10-CM | POA: Insufficient documentation

## 2025-01-12 DIAGNOSIS — J449 Chronic obstructive pulmonary disease, unspecified: Secondary | ICD-10-CM | POA: Insufficient documentation

## 2025-01-12 DIAGNOSIS — Z7984 Long term (current) use of oral hypoglycemic drugs: Secondary | ICD-10-CM | POA: Insufficient documentation

## 2025-01-12 DIAGNOSIS — I4891 Unspecified atrial fibrillation: Secondary | ICD-10-CM

## 2025-01-12 DIAGNOSIS — Z6841 Body Mass Index (BMI) 40.0 and over, adult: Secondary | ICD-10-CM | POA: Insufficient documentation

## 2025-01-12 DIAGNOSIS — I10 Essential (primary) hypertension: Secondary | ICD-10-CM | POA: Diagnosis present

## 2025-01-12 DIAGNOSIS — R0789 Other chest pain: Principal | ICD-10-CM | POA: Insufficient documentation

## 2025-01-12 DIAGNOSIS — R079 Chest pain, unspecified: Secondary | ICD-10-CM | POA: Diagnosis present

## 2025-01-12 DIAGNOSIS — I5042 Chronic combined systolic (congestive) and diastolic (congestive) heart failure: Secondary | ICD-10-CM | POA: Insufficient documentation

## 2025-01-12 LAB — BASIC METABOLIC PANEL WITH GFR
Anion gap: 13 (ref 5–15)
BUN: 20 mg/dL (ref 8–23)
CO2: 22 mmol/L (ref 22–32)
Calcium: 8.8 mg/dL — ABNORMAL LOW (ref 8.9–10.3)
Chloride: 102 mmol/L (ref 98–111)
Creatinine, Ser: 0.97 mg/dL (ref 0.61–1.24)
GFR, Estimated: >60 mL/min
Glucose, Bld: 240 mg/dL — ABNORMAL HIGH (ref 70–99)
Potassium: 4.6 mmol/L (ref 3.5–5.1)
Sodium: 136 mmol/L (ref 135–145)

## 2025-01-12 LAB — CBC
HCT: 46 % (ref 39.0–52.0)
Hemoglobin: 14.8 g/dL (ref 13.0–17.0)
MCH: 28.7 pg (ref 26.0–34.0)
MCHC: 32.2 g/dL (ref 30.0–36.0)
MCV: 89.3 fL (ref 80.0–100.0)
Platelets: 284 10*3/uL (ref 150–400)
RBC: 5.15 MIL/uL (ref 4.22–5.81)
RDW: 14.1 % (ref 11.5–15.5)
WBC: 12.3 10*3/uL — ABNORMAL HIGH (ref 4.0–10.5)
nRBC: 0 % (ref 0.0–0.2)

## 2025-01-12 LAB — TROPONIN T, HIGH SENSITIVITY
Troponin T High Sensitivity: 20 ng/L — ABNORMAL HIGH (ref 0–19)
Troponin T High Sensitivity: 16 ng/L (ref 0–19)

## 2025-01-12 LAB — HEPATIC FUNCTION PANEL
ALT: 23 U/L (ref 0–44)
AST: 22 U/L (ref 15–41)
Albumin: 4.3 g/dL (ref 3.5–5.0)
Alkaline Phosphatase: 127 U/L — ABNORMAL HIGH (ref 38–126)
Bilirubin, Direct: 0.2 mg/dL (ref 0.0–0.2)
Indirect Bilirubin: 0.3 mg/dL (ref 0.3–0.9)
Total Bilirubin: 0.5 mg/dL (ref 0.0–1.2)
Total Protein: 6.9 g/dL (ref 6.5–8.1)

## 2025-01-12 LAB — CBG MONITORING, ED: Glucose-Capillary: 123 mg/dL — ABNORMAL HIGH (ref 70–99)

## 2025-01-12 MED ORDER — IOHEXOL 350 MG/ML SOLN
75.0000 mL | Freq: Once | INTRAVENOUS | Status: AC | PRN
  Administered 2025-01-12: 75 mL via INTRAVENOUS

## 2025-01-12 MED ORDER — INSULIN GLARGINE 100 UNIT/ML ~~LOC~~ SOLN
10.0000 [IU] | Freq: Every day | SUBCUTANEOUS | Status: DC
  Administered 2025-01-12: 10 [IU] via SUBCUTANEOUS
  Filled 2025-01-12 (×2): qty 0.1

## 2025-01-12 MED ORDER — INSULIN ASPART 100 UNIT/ML IJ SOLN
0.0000 [IU] | INTRAMUSCULAR | Status: DC
  Administered 2025-01-12 – 2025-01-13 (×2): 1 [IU] via SUBCUTANEOUS
  Administered 2025-01-13: 3 [IU] via SUBCUTANEOUS
  Administered 2025-01-13: 1 [IU] via SUBCUTANEOUS
  Filled 2025-01-12: qty 3
  Filled 2025-01-12 (×3): qty 1

## 2025-01-12 NOTE — Subjective & Objective (Signed)
 Patient presented with chest pain he had a  fall few weeks ago,   LEft  stabing chest pain He continues to have worsening pain  Hx of CAD and unstable angina his last cardiac catheterization was in April 2025 at that time his stents to LAD and RCA were patent but he does have a residual 55% to his mid LAD and 50% right PLV lesion Chest pain may occur at times at rest does not seem to get worse with exertion, no SOB  Troponin 20-16 EKG nonischemic Patient has been taking aspirin  and Plavix   He has known history of A-fib but has and a flutter for which she has undergone isolation and no longer on anticoagulation

## 2025-01-12 NOTE — Assessment & Plan Note (Signed)
 Continue Protonix 

## 2025-01-12 NOTE — H&P (Signed)
 "    Jerry Shaffer FMW:[id number] DOB: October 19, 1961 DOA: 01/12/2025     PCP: Patient, No Pcp Per   Outpatient Specialists:  CARDS:    Dr. Jerel Balding, MD    Patient arrived to ER on 01/12/25 at 1103 Referred by Attending Franklyn Sid SAILOR, MD Pulmonology [place]    Patient coming from:    home Lives alone,    Chief Complaint:   Chief Complaint  Patient presents with   Fall   Chest Pain    HPI: Jerry Shaffer is a 64 y.o. male with medical history significant of CAD, DM2, migraines (does not tolerate  nitrates), ADHD tobacco abuse a flutter status post ablation in September 2023, morbid obesity, bipolar HLD HTN COPD, GERD, stroke    Presented with chest pain Patient presented with chest pain he had a  fall few weeks ago,   LEft  stabing chest pain He continues to have worsening pain  Hx of CAD and unstable angina his last cardiac catheterization was in April 2025 at that time his stents to LAD and RCA were patent but he does have a residual 55% to his mid LAD and 50% right PLV lesion Chest pain may occur at times at rest does not seem to get worse with exertion, no SOB  Troponin 20-16 EKG nonischemic Patient has been taking aspirin  and Plavix   He has known history of A-fib but has and a flutter for which she has undergone isolation and no longer on anticoagulation       Reports Chest pain is very sharp not worse with inspiration, no change with movement, but worse with palpation  Denies significant ETOH intake   Does not smoke     Regarding pertinent Chronic problems:    Hyperlipidemia -  on statins Lipitor  (atorvastatin )  Lipid Panel     Component Value Date/Time   CHOL 91 03/24/2024 2210   CHOL 97 (L) 08/21/2023 0940   TRIG 74 03/24/2024 2210   HDL 29 (L) 03/24/2024 2210   HDL 32 (L) 08/21/2023 0940   CHOLHDL 3.1 03/24/2024 2210   VLDL 15 03/24/2024 2210   LDLCALC 47 03/24/2024 2210   LDLCALC 51 08/21/2023 0940   LABVLDL 14 08/21/2023 0940     HTN on  Toprol    chronic CHF diastolic/systolic/ combined - last echo  Recent Results (from the past 56199 hours)  ECHOCARDIOGRAM COMPLETE   Collection Time: 03/25/24  9:09 AM  Result Value   Weight 3,820.13   Height 68   BP 173/95   S' Lateral 3.00   AR max vel 2.74   AV Peak grad 6.4   Ao pk vel 1.26   Area-P 1/2 2.56   MV VTI 3.35   Est EF 55 - 60%   Narrative      ECHOCARDIOGRAM REPORT      IMPRESSIONS    1. Left ventricular ejection fraction, by estimation, is 55 to 60%. The left ventricle has normal function. The left ventricle has no regional wall motion abnormalities. There is mild concentric left ventricular hypertrophy. Left ventricular diastolic  parameters were normal.  2. Right ventricular systolic function is normal. The right ventricular size is normal. Tricuspid regurgitation signal is inadequate for assessing PA pressure.  3. The mitral valve is normal in structure. No evidence of mitral valve regurgitation. No evidence of mitral stenosis.  4. The aortic valve is normal in structure. Aortic valve regurgitation is not visualized. No aortic stenosis is present.  5. The inferior  vena cava is normal in size with greater than 50% respiratory variability, suggesting right atrial pressure of 3 mmHg.           CAD  - On Aspirin , statin, betablocker, Plavix                  -  followed by cardiology                     DM 2 -  Lab Results  Component Value Date   HGBA1C 9.1 (A) 11/02/2024   on insulin  Lantus  15 units bid   Morbid obesity-   BMI Readings from Last 1 Encounters:  01/12/25 40.88 kg/m      COPD -  followed by pulmonology       Hx of CVA -  with/out residual deficits on Aspirin  81 mg,  Plavix    A. Fib -   atrial fibrillation  Not on anticoagulation since ablation   -  Rate control:  Currently controlled with  Toprolol,        While in ER: Clinical Course as of 01/12/25 2124  Thu Jan 12, 2025  1857 DG Chest 2 View No active cardiopulmonary disease.  [HN]  2013 CT Chest W Contrast 1. No acute intrathoracic pathology. 2. Three-vessel coronary vascular calcification and coronary stent in the LAD. 3.  Aortic Atherosclerosis (ICD10-I70.0).  [HN]  2013 Troponin T High Sensitivity: 16 20 --> 16 [HN]  2051 Discussed with Dr. Cesario with cardiology who recommends inpatient observation with potential cath tomorrow.  Recommends hospitalist admission.  Consulted to hospitalist for admission [HN]    Clinical Course User Index [HN] Franklyn Sid SAILOR, MD       Lab Orders         Basic metabolic panel         CBC         Hepatic function panel      CXR -  NON acute    CT chest - Three-vessel coronary vascular calcification and coronary stent in the LAD.  Following Medications were ordered in ER: Medications  iohexol  (OMNIPAQUE ) 350 MG/ML injection 75 mL (75 mLs Intravenous Contrast Given 01/12/25 1954)    _______________________________________________________ ER Provider Called:    Cardiology Dr. Cesario  They Recommend admit to medicine    SEEN in ER     ED Triage Vitals  Encounter Vitals Group     BP 01/12/25 1106 139/81     Girls Systolic BP Percentile --      Girls Diastolic BP Percentile --      Boys Systolic BP Percentile --      Boys Diastolic BP Percentile --      Pulse Rate 01/12/25 1106 89     Resp 01/12/25 1106 18     Temp 01/12/25 1106 98.3 F (36.8 C)     Temp Source 01/12/25 1106 Oral     SpO2 01/12/25 1106 95 %     Weight 01/12/25 1129 261 lb (118.4 kg)     Height 01/12/25 1129 5' 7 (1.702 m)     Head Circumference --      Peak Flow --      Pain Score 01/12/25 1129 2     Pain Loc --      Pain Education --      Exclude from Growth Chart --   UFJK(75)@     _________________________________________ Significant initial  Findings: Abnormal Labs Reviewed  BASIC METABOLIC PANEL WITH GFR -  Abnormal; Notable for the following components:      Result Value   Glucose, Bld 240 (*)    Calcium  8.8 (*)    All other  components within normal limits  CBC - Abnormal; Notable for the following components:   WBC 12.3 (*)    All other components within normal limits  TROPONIN T, HIGH SENSITIVITY - Abnormal; Notable for the following components:   Troponin T High Sensitivity 20 (*)    All other components within normal limits    _________________________ Troponin  20 - 16 Cardiac Panel (last 3 results) No results for input(s): CKTOTAL, CKMB, TROPONINIHS, RELINDX in the last 72 hours.   ECG: Ordered Personally reviewed and interpreted by me showing: HR : 105 Rhythm: Atrial fibrillation QTC 444   The recent clinical data is shown below. Vitals:   01/12/25 1106 01/12/25 1129 01/12/25 1618 01/12/25 1948  BP: 139/81  135/81 (!) 135/100  Pulse: 89  76 92  Resp: 18  19 17   Temp: 98.3 F (36.8 C)  98.3 F (36.8 C) 98.8 F (37.1 C)  TempSrc: Oral  Oral Oral  SpO2: 95%  99% 100%  Weight:  118.4 kg    Height:  5' 7 (1.702 m)      WBC     Component Value Date/Time   WBC 12.3 (H) 01/12/2025 1149   LYMPHSABS 1.1 12/15/2024 2141   MONOABS 0.5 12/15/2024 2141   EOSABS 0.0 12/15/2024 2141   BASOSABS 0.1 12/15/2024 2141     __________________________________________________________ Recent Labs  Lab 01/12/25 1149  NA 136  K 4.6  CO2 22  GLUCOSE 240*  BUN 20  CREATININE 0.97  CALCIUM  8.8*    Cr   stable,    Lab Results  Component Value Date   CREATININE 0.97 01/12/2025   CREATININE 0.89 12/16/2024   CREATININE 1.10 12/15/2024        Plt: Lab Results  Component Value Date   PLT 284 01/12/2025         Recent Labs  Lab 01/12/25 1149  WBC 12.3*  HGB 14.8  HCT 46.0  MCV 89.3  PLT 284    HG/HCT  stable,     Component Value Date/Time   HGB 14.8 01/12/2025 1149   HGB 13.1 08/21/2023 0940   HCT 46.0 01/12/2025 1149   HCT 40.1 08/21/2023 0940   MCV 89.3 01/12/2025 1149   MCV 85 08/21/2023 0940    _______________________________________________ Hospitalist was  called for admission for   Atypical chest pain  The following Work up has been ordered so far:  Orders Placed This Encounter  Procedures   DG Chest 2 View   CT Chest W Contrast   Basic metabolic panel   CBC   Hepatic function panel   Document Height and Actual Weight   Inpatient consult to Cardiology   Consult to hospitalist   ED EKG     OTHER Significant initial  Findings:  labs showing:     DM  labs:  HbA1C: Recent Labs    03/24/24 2009 07/04/24 0929 11/02/24 1324  HGBA1C 6.4* 7.3* 9.1*       CBG (last 3)  No results for input(s): GLUCAP in the last 72 hours.        Cultures:    Component Value Date/Time   SDES BLOOD RIGHT ARM 05/03/2013 0539   SPECREQUEST BOTTLES DRAWN AEROBIC ONLY 10CC 05/03/2013 0539   CULT NO GROWTH 5 DAYS 05/03/2013 0539   REPTSTATUS 05/09/2013 FINAL  05/03/2013 0539     Radiological Exams on Admission: CT Chest W Contrast Result Date: 01/12/2025 CLINICAL DATA:  Blunt trauma to the chest.  Pain. EXAM: CT CHEST WITH CONTRAST TECHNIQUE: Multidetector CT imaging of the chest was performed during intravenous contrast administration. RADIATION DOSE REDUCTION: This exam was performed according to the departmental dose-optimization program which includes automated exposure control, adjustment of the mA and/or kV according to patient size and/or use of iterative reconstruction technique. CONTRAST:  75mL OMNIPAQUE  IOHEXOL  350 MG/ML SOLN COMPARISON:  Chest radiograph dated 01/12/2025 and CT dated 03/18/2024. FINDINGS: Cardiovascular: There is no cardiomegaly. Trace pericardial effusion. Three-vessel coronary vascular calcification and coronary stent in the LAD. Mild atherosclerotic calcification of the aortic arch. No intimal dilatation or dissection. The origins of the great vessels of the aortic arch appear patent. No pulmonary artery embolus identified. Mediastinum/Nodes: No hilar or mediastinal adenopathy. The esophagus is grossly unremarkable. No  mediastinal fluid collection. Lungs/Pleura: No focal consolidation, pleural effusion, pneumothorax. The central airways are patent. Upper Abdomen: No acute abnormality. Musculoskeletal: No acute osseous pathology. No displaced rib fractures. Old healed fracture of the upper sternal body. IMPRESSION: 1. No acute intrathoracic pathology. 2. Three-vessel coronary vascular calcification and coronary stent in the LAD. 3.  Aortic Atherosclerosis (ICD10-I70.0). Electronically Signed   By: Vanetta Chou M.D.   On: 01/12/2025 20:09   DG Chest 2 View Result Date: 01/12/2025 CLINICAL DATA:  Chest pain. EXAM: CHEST - 2 VIEW COMPARISON:  Chest radiograph dated 12/15/2024. FINDINGS: The heart size and mediastinal contours are within normal limits. Both lungs are clear. The visualized skeletal structures are unremarkable. IMPRESSION: No active cardiopulmonary disease. Electronically Signed   By: Vanetta Chou M.D.   On: 01/12/2025 13:32   _______________________________________________________________________________________________________ Latest  Blood pressure (!) 135/100, pulse 92, temperature 98.8 F (37.1 C), temperature source Oral, resp. rate 17, height 5' 7 (1.702 m), weight 118.4 kg, SpO2 100%.   Vitals  labs and radiology finding personally reviewed  Review of Systems:    Pertinent positives include:   fatigue, chest pain, Constitutional:  No weight loss, night sweats, Fevers, chills, weight loss  HEENT:  No headaches, Difficulty swallowing,Tooth/dental problems,Sore throat,  No sneezing, itching, ear ache, nasal congestion, post nasal drip,  Cardio-vascular:  No  Orthopnea, PND, anasarca, dizziness, palpitations.no Bilateral lower extremity swelling  GI:  No heartburn, indigestion, abdominal pain, nausea, vomiting, diarrhea, change in bowel habits, loss of appetite, melena, blood in stool, hematemesis Resp:  no shortness of breath at rest. No dyspnea on exertion, No excess mucus, no  productive cough, No non-productive cough, No coughing up of blood.No change in color of mucus.No wheezing. Skin:  no rash or lesions. No jaundice GU:  no dysuria, change in color of urine, no urgency or frequency. No straining to urinate.  No flank pain.  Musculoskeletal:  No joint pain or no joint swelling. No decreased range of motion. No back pain.  Psych:  No change in mood or affect. No depression or anxiety. No memory loss.  Neuro: no localizing neurological complaints, no tingling, no weakness, no double vision, no gait abnormality, no slurred speech, no confusion  All systems reviewed and apart from HOPI all are negative _______________________________________________________________________________________________ Past Medical History:   Past Medical History:  Diagnosis Date   Atrial flutter (HCC)    Bipolar 1 disorder (HCC)    Coronary artery disease    Diabetes mellitus    Dyslipidemia    Emphysema    GERD (gastroesophageal reflux disease)  History of kidney stones    Hypertension    Mental disorder    BIPOLAR DISORDER   Migraine    NSTEMI (non-ST elevated myocardial infarction) (HCC) 09/20/2020   Pneumonia    PONV (postoperative nausea and vomiting)    Renal disorder    Shortness of breath    Stroke (HCC)    Transient ischemic attack (TIA)       Past Surgical History:  Procedure Laterality Date   A-FLUTTER ABLATION N/A 09/08/2022   Procedure: A-FLUTTER ABLATION;  Surgeon: Nancey Eulas BRAVO, MD;  Location: [facility];  Service: Cardiovascular;  Laterality: N/A;   COLONOSCOPY WITH PROPOFOL  N/A 10/17/2021   Procedure: COLONOSCOPY WITH PROPOFOL ;  Surgeon: Teressa Toribio SQUIBB, MD;  Location: [facility];  Service: Endoscopy;  Laterality: N/A;   CORONARY ANGIOGRAPHY  09/20/2020   CORONARY STENT INTERVENTION  09/20/2020   CORONARY STENT INTERVENTION   CORONARY STENT INTERVENTION N/A 09/20/2020   Procedure: CORONARY STENT INTERVENTION;  Surgeon: Claudene Victory ORN, MD;  Location: [facility];  Service: Cardiovascular;  Laterality: N/A;   CORONARY STENT INTERVENTION N/A 03/28/2021   Procedure: CORONARY STENT INTERVENTION;  Surgeon: Wonda Sharper, MD;  Location: [facility] INVASIVE CV LAB;  Service: Cardiovascular;  Laterality: N/A;   CORONARY STENT INTERVENTION N/A 11/20/2023   Procedure: CORONARY STENT INTERVENTION;  Surgeon: Wonda Sharper, MD;  Location: [facility] INVASIVE CV LAB;  Service: Cardiovascular;  Laterality: N/A;   HEMORROIDECTOMY     KNEE ARTHROSCOPY     LEFT HEART CATH AND CORONARY ANGIOGRAPHY N/A 09/20/2020   Procedure: LEFT HEART CATH AND CORONARY ANGIOGRAPHY;  Surgeon: Claudene Victory ORN, MD;  Location: [facility];  Service: Cardiovascular;  Laterality: N/A;   LEFT HEART CATH AND CORONARY ANGIOGRAPHY N/A 03/28/2021   Procedure: LEFT HEART CATH AND CORONARY ANGIOGRAPHY;  Surgeon: Wonda Sharper, MD;  Location: [facility] INVASIVE CV LAB;  Service: Cardiovascular;  Laterality: N/A;   LEFT HEART CATH AND CORONARY ANGIOGRAPHY N/A 11/20/2023   Procedure: LEFT HEART CATH AND CORONARY ANGIOGRAPHY;  Surgeon: Wonda Sharper, MD;  Location: [facility] INVASIVE CV LAB;  Service: Cardiovascular;  Laterality: N/A;   LEFT HEART CATH AND CORONARY ANGIOGRAPHY N/A 03/25/2024   Procedure: LEFT HEART CATH AND CORONARY ANGIOGRAPHY;  Surgeon: Anner Alm ORN, MD;  Location: [facility] INVASIVE CV LAB;  Service: Cardiovascular;  Laterality: N/A;   LUMBAR DISC SURGERY     POLYPECTOMY  10/17/2021   Procedure: POLYPECTOMY;  Surgeon: Teressa Toribio SQUIBB, MD;  Location: [facility];  Service: Endoscopy;;   SHOULDER ARTHROSCOPY WITH DISTAL CLAVICLE RESECTION Left 01/20/2023   Procedure: SHOULDER ARTHROSCOPY WITH DISTAL CLAVICLE EXCISION;  Surgeon: Josefina Chew, MD;  Location: [facility];  Service: Orthopedics;  Laterality: Left;   SHOULDER ARTHROSCOPY WITH ROTATOR CUFF REPAIR AND SUBACROMIAL DECOMPRESSION Left 01/20/2023   Procedure: SHOULDER ARTHROSCOPY WITH ROTATOR CUFF REPAIR AND  SUBACROMIAL DECOMPRESSION;  Surgeon: Josefina Chew, MD;  Location: [facility];  Service: Orthopedics;  Laterality: Left;   TONSILLECTOMY AND ADENOIDECTOMY      Social History:  Ambulatory   independently     reports that he quit smoking about 21 years ago. His smoking use included e-cigarettes and cigarettes. He has been exposed to tobacco smoke. He quit smokeless tobacco use about 4 years ago. He reports current drug use. Drug: Marijuana. He reports that he does not drink alcohol.   Family History:   Family History  Problem Relation Age of Onset   Diabetes type II Other    Coronary artery  disease Other    Bipolar disorder Other    Lung cancer Father    CAD Mother 68   ______________________________________________________________________________________________ Allergies: Allergies[1]   Prior to Admission medications  Medication Sig Start Date End Date Taking? Authorizing Provider  albuterol  (VENTOLIN  HFA) 108 (90 Base) MCG/ACT inhaler INHALE 1 TO 2 PUFFS INTO THE LUNGS EVERY 6 HOURS AS NEEDED FOR WHEEZING OR SHORTNESS OF BREATH 09/06/24   Croitoru, Mihai, MD  aspirin  EC 81 MG tablet Take 1 tablet (81 mg total) by mouth daily. Swallow whole. 08/21/23   Croitoru, Mihai, MD  atorvastatin  (LIPITOR ) 80 MG tablet Take 1 tablet (80 mg total) by mouth daily. 11/04/24   Croitoru, Mihai, MD  budesonide -glycopyrrolate-formoterol  (BREZTRI  AEROSPHERE) 160-9-4.8 MCG/ACT AERO inhaler Inhale 2 puffs into the lungs in the morning and at bedtime. 10/31/24   [place], Donnice SAUNDERS, MD  chlorpheniramine-HYDROcodone  (TUSSIONEX) 10-8 MG/5ML Take 5 mLs by mouth every 12 (twelve) hours as needed for cough. 11/26/24   Garrick Charleston, MD  clopidogrel  (PLAVIX ) 75 MG tablet Take 1 tablet (75 mg total) by mouth daily. 11/04/24   Croitoru, Mihai, MD  glipiZIDE  (GLUCOTROL ) 5 MG tablet Take 1 tablet (5 mg total) by mouth 2 (two) times daily before a meal. 11/02/24   Trixie File, MD  insulin  glargine (LANTUS   SOLOSTAR) 100 UNIT/ML Solostar Pen Inject 25-30 Units into the skin at bedtime. 11/08/24   Trixie File, MD  Insulin  Pen Needle 32G X 4 MM MISC Use 1x a day 11/02/24   Trixie File, MD  metFORMIN  (GLUCOPHAGE -XR) 500 MG 24 hr tablet Take 1000mg  2x a day 09/30/24   Trixie File, MD  methocarbamol  (ROBAXIN ) 500 MG tablet Take 1 tablet (500 mg total) by mouth every 8 (eight) hours as needed for muscle spasms. 11/04/24   Croitoru, Mihai, MD  metoprolol  succinate (TOPROL -XL) 200 MG 24 hr tablet Take 1 tablet (200 mg total) by mouth daily. Take with or immediately following a meal. 11/04/24   Croitoru, Mihai, MD  nitroGLYCERIN  (NITROSTAT ) 0.4 MG SL tablet Place 1 tablet (0.4 mg total) under the tongue every 5 (five) minutes as needed for chest pain. 12/08/23 03/25/25  Jerilynn Lamarr HERO, NP  OVER THE COUNTER MEDICATION Take 1-2 packets by mouth daily. [employer] Extra Strength Headache Powder    [provider]  pantoprazole  (PROTONIX ) 40 MG tablet Take 1 tablet (40 mg total) by mouth daily. 02/26/24   Croitoru, Mihai, MD  predniSONE  (DELTASONE ) 20 MG tablet Take 2 tablets (40 mg total) by mouth daily with breakfast. For the next four days 11/26/24   Garrick Charleston, MD  promethazine  (PHENERGAN ) 25 MG tablet Take 1 tablet (25 mg total) by mouth every 6 (six) hours as needed for nausea or vomiting. 12/16/24   Ruthell Lonni FALCON, PA-C  ranolazine  (RANEXA ) 500 MG 12 hr tablet Take 2 tablets (1,000 mg total) by mouth 2 (two) times daily. 11/04/24   Croitoru, Jerel, MD  Spacer/Aero-Holding Raguel DEVI Use with inhalers 10/31/24   Hunsucker, Donnice SAUNDERS, MD  TRULICITY  0.75 MG/0.5ML SOAJ Inject 0.75 mg into the skin once a week. 10/17/24   Trixie File, MD    ___________________________________________________________________________________________________ Physical Exam:    01/12/2025    7:48 PM 01/12/2025    4:18 PM 01/12/2025   11:29 AM  Vitals with BMI  Height   5' 7  Weight    261 lbs  BMI   40.87  Systolic 135 135   Diastolic 100 81   Pulse 92 76  1. General:  in No  Acute distress    Chronically ill   -appearing 2. Psychological: Alert and   Oriented 3. Head/ENT:    Dry Mucous Membranes                          Head Non traumatic, neck supple                          Normal   Dentition 4. SKIN:  decreased Skin turgor,  Skin clean Dry and intact no rash    5. Heart: Regular rate and rhythm no  Murmur, no Rub or gallop 6. Lungs:   no wheezes or crackles   7. Abdomen: Soft,  non-tender, Non distended   obese  bowel sounds present 8. Lower extremities: no clubbing, cyanosis, no  edema 9. Neurologically Grossly intact, moving all 4 extremities equally   10. MSK: Normal range of motion    Chart has been reviewed  ______________________________________________________________________________________________  Assessment/Plan  64 y.o. male with medical history significant of CAD, DM2, migraines (does not tolerate  nitrates), ADHD tobacco abuse a flutter status post ablation in September 2023, morbid obesity, bipolar HLD HTN COPD, GERD, stroke  Admitted for   Atypical chest pain    Present on Admission:  Chest pain  HTN (hypertension)  Hyperlipidemia  GERD  Paroxysmal A-fib (HCC)     CAD S/P percutaneous coronary angioplasty Appreciate cardiology consult Continue Lipitor  80 mg daily continue beta-blocker defer to a.m. team if able to titrate up  HTN (hypertension) Continue Toprol  200 mg daily  Controlled type 2 diabetes mellitus without complication, without long-term current use of insulin  (HCC) Order sliding scale Decrease dose of Lantus  down to 10 units  Hyperlipidemia Continue Lipitor  80 mg a day  GERD Continue Protonix   Chest pain Unclear etiology somewhat atypical chest pain Continue to cycle cardiac enzymes Echo in the morning appreciate cardiology consult May need further cardiac workup for right now no indication for  heparinization according to cardiology  History of TIA (transient ischemic attack) Continue aspirin  Plavix  and Lipitor   Paroxysmal A-fib (HCC) If blood pressure stable consider titrating up Toprol  If no interventions needed tomorrow could consider transitioning to Plavix  and DOAC pending cardiology evaluation   Other plan as per orders.  DVT prophylaxis:  SCD      Code Status:    Code Status: Prior FULL CODE  as per patient   I had personally discussed CODE STATUS with patient   ACP   none   Family Communication:   Family not at  Bedside    Diet  npo post midnight   Disposition Plan:       To home once workup is complete and patient is stable   Following barriers for discharge:                             Chest pain  work up is complete                                                         Pain controlled with PO medications  Will need consultants to evaluate patient prior to discharge                     Consult Orders  (From admission, onward)           Start     Ordered   01/12/25 2051  Consult to hospitalist  Pg by Elspeth  Once       Provider:  (Not yet assigned)  Question Answer Comment  Place call to: [facility]   Reason for Consult Admit      01/12/25 2050                               Would benefit from PT/OT eval prior to DC  Ordered                     Consults called: Cardiology    Admission status:  ED Disposition     ED Disposition  Admit   Condition  --   Comment  Hospital Area: [facility] [[id number]]  Level of Care: Progressive [102]  Admit to Progressive based on following criteria: CARDIOVASCULAR & THORACIC of moderate stability with acute coronary syndrome symptoms/low risk myocardial infarction/hypertensive urgency/arrhythmias/heart failure potentially compromising stability and stable post cardiovascular intervention patients.  May place patient in  observation at [facility] or [facility] if equivalent level of care is available:: No  Diagnosis: Chest pain [255200]  Admitting Physician: Alexxus Sobh [[id number]]  Attending Physician: Alliyah Roesler [[id number]]          Obs     Level of care          progressive    Blease Quiver 01/12/2025, 10:53 PM    [facility]     after 2 AM please page floor coverage   If 7AM-7PM, please contact the day team taking care of the patient using [email or web address]        [1]  Allergies Allergen Reactions   Bee Venom Anaphylaxis   Penicillins Anaphylaxis    Did it involve swelling of the face/tongue/throat, SOB, or low BP? yes Did it involve sudden or severe rash/hives, skin peeling, or any reaction on the inside of your mouth or nose? unknown Did you need to seek medical attention at a hospital or doctor's office? yes When did it last happen?      1967 If all above answers are NO, may proceed with cephalosporin use.    Procaine Anaphylaxis   Isosorbide  Other (See Comments)    Caused migraines   Tape Itching and Rash    Certain medical tapes   "

## 2025-01-12 NOTE — Assessment & Plan Note (Signed)
 Unclear etiology somewhat atypical chest pain Continue to cycle cardiac enzymes Echo in the morning appreciate cardiology consult May need further cardiac workup for right now no indication for heparinization according to cardiology

## 2025-01-12 NOTE — Assessment & Plan Note (Signed)
Continue Lipitor 80 mg a day.

## 2025-01-12 NOTE — Consult Note (Signed)
 "  Cardiology Consultation   Patient ID: Jerry Shaffer MRN: [id number]; DOB: 06-21-1961  Admit date: 01/12/2025 Date of Consult: 01/12/2025  PCP:  Patient, No Pcp Per   [facility] Providers Cardiologist:  Jerel Balding, MD  Electrophysiologist:  Eulas FORBES Furbish, MD       Patient Profile: Jerry Shaffer is a 64 y.o. male with a hx of prior PCI for unstable angina who is being seen 01/12/2025 for the evaluation of atypical chest pain at the request of the ED.  History of Present Illness: Mr. Jaso presents after a fall a few weeks ago w/ traumatic injury to his chest complaining of persistent recurring chest discomfort and intermittent dizziness.  Chest discomfort is nonexertional, stabbing, no associated SOB.  Not clearly similar to the chest discomfort leading up to his prior stents.  Last cath was 03/2024 where prior prox LAD and RCA stents were widely patent.  Note was made of residual 55% mid LAD lesion, as well as 50% RPAV lesion with plans for medical management.  That cath was also done for unstable angina without e/o MI.  ECG today does not show any ischemic ST or T wave changes. He remains on ASA/clopidogrel , and is also on metoprolol  and ranolazine  1000 bid for anginal mgmt.  Has not tolerated nitrates in the past due to headches  Had prior A flutter ablation and used to but no longer on apixaban .  ECG today shows afib with rates in the low 100s and patient notes some palpitations in the last few months.   Past Medical History:  Diagnosis Date   Atrial flutter (HCC)    Bipolar 1 disorder (HCC)    Coronary artery disease    Diabetes mellitus    Dyslipidemia    Emphysema    GERD (gastroesophageal reflux disease)    History of kidney stones    Hypertension    Mental disorder    BIPOLAR DISORDER   Migraine    NSTEMI (non-ST elevated myocardial infarction) (HCC) 09/20/2020   Pneumonia    PONV (postoperative nausea and vomiting)    Renal disorder     Shortness of breath    Stroke (HCC)    Transient ischemic attack (TIA)     Past Surgical History:  Procedure Laterality Date   A-FLUTTER ABLATION N/A 09/08/2022   Procedure: A-FLUTTER ABLATION;  Surgeon: Mealor, Eulas FORBES, MD;  Location: [facility] INVASIVE CV LAB;  Service: Cardiovascular;  Laterality: N/A;   COLONOSCOPY WITH PROPOFOL  N/A 10/17/2021   Procedure: COLONOSCOPY WITH PROPOFOL ;  Surgeon: Teressa Toribio SQUIBB, MD;  Location: [facility];  Service: Endoscopy;  Laterality: N/A;   CORONARY ANGIOGRAPHY  09/20/2020   CORONARY STENT INTERVENTION  09/20/2020   CORONARY STENT INTERVENTION   CORONARY STENT INTERVENTION N/A 09/20/2020   Procedure: CORONARY STENT INTERVENTION;  Surgeon: Claudene Victory ORN, MD;  Location: [facility] CV [facility];  Service: Cardiovascular;  Laterality: N/A;   CORONARY STENT INTERVENTION N/A 03/28/2021   Procedure: CORONARY STENT INTERVENTION;  Surgeon: Wonda Sharper, MD;  Location: [facility] INVASIVE CV LAB;  Service: Cardiovascular;  Laterality: N/A;   CORONARY STENT INTERVENTION N/A 11/20/2023   Procedure: CORONARY STENT INTERVENTION;  Surgeon: Wonda Sharper, MD;  Location: [facility] INVASIVE CV LAB;  Service: Cardiovascular;  Laterality: N/A;   HEMORROIDECTOMY     KNEE ARTHROSCOPY     LEFT HEART CATH AND CORONARY ANGIOGRAPHY N/A 09/20/2020   Procedure: LEFT HEART CATH AND CORONARY ANGIOGRAPHY;  Surgeon: Claudene Victory ORN, MD;  Location: [facility] INVASIVE  CV LAB;  Service: Cardiovascular;  Laterality: N/A;   LEFT HEART CATH AND CORONARY ANGIOGRAPHY N/A 03/28/2021   Procedure: LEFT HEART CATH AND CORONARY ANGIOGRAPHY;  Surgeon: Wonda Sharper, MD;  Location: [facility] INVASIVE CV LAB;  Service: Cardiovascular;  Laterality: N/A;   LEFT HEART CATH AND CORONARY ANGIOGRAPHY N/A 11/20/2023   Procedure: LEFT HEART CATH AND CORONARY ANGIOGRAPHY;  Surgeon: Wonda Sharper, MD;  Location: [facility] INVASIVE CV LAB;  Service: Cardiovascular;  Laterality: N/A;   LEFT HEART CATH AND CORONARY ANGIOGRAPHY N/A 03/25/2024    Procedure: LEFT HEART CATH AND CORONARY ANGIOGRAPHY;  Surgeon: Anner Alm ORN, MD;  Location: [facility] INVASIVE CV LAB;  Service: Cardiovascular;  Laterality: N/A;   LUMBAR DISC SURGERY     POLYPECTOMY  10/17/2021   Procedure: POLYPECTOMY;  Surgeon: Teressa Toribio SQUIBB, MD;  Location: [facility];  Service: Endoscopy;;   SHOULDER ARTHROSCOPY WITH DISTAL CLAVICLE RESECTION Left 01/20/2023   Procedure: SHOULDER ARTHROSCOPY WITH DISTAL CLAVICLE EXCISION;  Surgeon: Josefina Chew, MD;  Location: [facility] ORS;  Service: Orthopedics;  Laterality: Left;   SHOULDER ARTHROSCOPY WITH ROTATOR CUFF REPAIR AND SUBACROMIAL DECOMPRESSION Left 01/20/2023   Procedure: SHOULDER ARTHROSCOPY WITH ROTATOR CUFF REPAIR AND SUBACROMIAL DECOMPRESSION;  Surgeon: Josefina Chew, MD;  Location: [facility];  Service: Orthopedics;  Laterality: Left;   TONSILLECTOMY AND ADENOIDECTOMY       Medications: reconciled  Allergies:   Allergies[1]  Social History:   Social History   Socioeconomic History   Marital status: Divorced    Spouse name: Not on file   Number of children: 0   Years of education: Not on file   Highest education level: Not on file  Occupational History   Occupation: Security guard    Employer: [employer]  Tobacco Use   Smoking status: Former    Current packs/day: 0.00    Types: E-cigarettes, Cigarettes    Quit date: 2005    Years since quitting: 21.0    Passive exposure: Past   Smokeless tobacco: Former    Quit date: 2022  Vaping Use   Vaping status: Former   Start date: 12/16/2003   Quit date: 12/15/2018  Substance and Sexual Activity   Alcohol use: No   Drug use: Yes    Types: Marijuana    Comment: gummies   Sexual activity: Not Currently  Other Topics Concern   Not on file  Social History Narrative   Not on file   Social Drivers of Health   Tobacco Use: Medium Risk (01/12/2025)   Patient History    Smoking Tobacco Use: Former    Smokeless Tobacco Use: Former    Passive Exposure: Past   Programmer, Applications: Not on [employer] Insecurity: No Food Insecurity (03/25/2024)   Hunger Vital Sign    Worried About Running Out of Food in the Last Year: Never true    Ran Out of Food in the Last Year: Never true  Transportation Needs: No Transportation Needs (03/25/2024)   PRAPARE - Administrator, Civil Service (Medical): No    Lack of Transportation (Non-Medical): No  Physical Activity: Not on file  Stress: Not on file  Social Connections: Unknown (03/25/2024)   Social Connection and Isolation Panel    Frequency of Communication with Friends and Family: Not on file    Frequency of Social Gatherings with Friends and Family: Not on file    Attends Religious Services: Not on file    Active Member of Clubs or Organizations: Not on  file    Attends Club or Organization Meetings: Not on file    Marital Status: Divorced  Intimate Partner Violence: Not At Risk (03/25/2024)   Humiliation, Afraid, Rape, and Kick questionnaire    Fear of Current or Ex-Partner: No    Emotionally Abused: No    Physically Abused: No    Sexually Abused: No  Depression (PHQ2-9): Not on file  Alcohol Screen: Not on file  Housing: Low Risk (03/25/2024)   Housing Stability Vital Sign    Unable to Pay for Housing in the Last Year: No    Number of Times Moved in the Last Year: 0    Homeless in the Last Year: No  Utilities: Not At Risk (03/25/2024)   AHC Utilities    Threatened with loss of utilities: No  Health Literacy: Not on file    Family History:   Family History  Problem Relation Age of Onset   Diabetes type II Other    Coronary artery disease Other    Bipolar disorder Other    Lung cancer Father    CAD Mother 33     ROS:  Please see the history of present illness.  All other ROS reviewed and negative.     Physical Exam/Data: Vitals:   01/12/25 1106 01/12/25 1129 01/12/25 1618 01/12/25 1948  BP: 139/81  135/81 (!) 135/100  Pulse: 89  76 92  Resp: 18  19 17   Temp: 98.3 F  (36.8 C)  98.3 F (36.8 C) 98.8 F (37.1 C)  TempSrc: Oral  Oral Oral  SpO2: 95%  99% 100%  Weight:  118.4 kg    Height:  5' 7 (1.702 m)        01/12/2025   11:29 AM 12/15/2024    9:16 PM 11/04/2024    8:53 AM  Last 3 Weights  Weight (lbs) 261 lb 261 lb 264 lb  Weight (kg) 118.389 kg 118.389 kg 119.75 kg     Body mass index is 40.88 kg/m.  General:  Well nourished, well developed, in no acute distress HEENT: normal Neck: no JVD Vascular: Distal pulses 2+ bilaterally Cardiac:  normal S1, S2; RRR; no murmur  Lungs:  clear to auscultation bilaterally, no wheezing, rhonchi or rales  Abd: soft, nontender, bowel sounds present Ext: no edema Skin: warm and dry   EKG:  The EKG was personally reviewed and demonstrates:  as noted above   Laboratory Data: High Sensitivity Troponin:  20 to 16  Chemistry Recent Labs  Lab 01/12/25 1149  NA 136  K 4.6  CL 102  CO2 22  GLUCOSE 240*  BUN 20  CREATININE 0.97  CALCIUM  8.8*  GFRNONAA >60  ANIONGAP 13     Hematology Recent Labs  Lab 01/12/25 1149  WBC 12.3*  RBC 5.15  HGB 14.8  HCT 46.0  MCV 89.3  MCH 28.7  MCHC 32.2  RDW 14.1  PLT 284    CT Chest W Contrast Result Date: 01/12/2025 CLINICAL DATA:  Blunt trauma to the chest.  Pain. EXAM: CT CHEST WITH CONTRAST TECHNIQUE: Multidetector CT imaging of the chest was performed during intravenous contrast administration. RADIATION DOSE REDUCTION: This exam was performed according to the departmental dose-optimization program which includes automated exposure control, adjustment of the mA and/or kV according to patient size and/or use of iterative reconstruction technique. CONTRAST:  75mL OMNIPAQUE  IOHEXOL  350 MG/ML SOLN COMPARISON:  Chest radiograph dated 01/12/2025 and CT dated 03/18/2024. FINDINGS: Cardiovascular: There is no cardiomegaly. Trace pericardial effusion. Three-vessel  coronary vascular calcification and coronary stent in the LAD. Mild atherosclerotic calcification  of the aortic arch. No intimal dilatation or dissection. The origins of the great vessels of the aortic arch appear patent. No pulmonary artery embolus identified. Mediastinum/Nodes: No hilar or mediastinal adenopathy. The esophagus is grossly unremarkable. No mediastinal fluid collection. Lungs/Pleura: No focal consolidation, pleural effusion, pneumothorax. The central airways are patent. Upper Abdomen: No acute abnormality. Musculoskeletal: No acute osseous pathology. No displaced rib fractures. Old healed fracture of the upper sternal body. IMPRESSION: 1. No acute intrathoracic pathology. 2. Three-vessel coronary vascular calcification and coronary stent in the LAD. 3.  Aortic Atherosclerosis (ICD10-I70.0). Electronically Signed   By: Vanetta Chou M.D.   On: 01/12/2025 20:09   DG Chest 2 View Result Date: 01/12/2025 CLINICAL DATA:  Chest pain. EXAM: CHEST - 2 VIEW COMPARISON:  Chest radiograph dated 12/15/2024. FINDINGS: The heart size and mediastinal contours are within normal limits. Both lungs are clear. The visualized skeletal structures are unremarkable. IMPRESSION: No active cardiopulmonary disease. Electronically Signed   By: Vanetta Chou M.D.   On: 01/12/2025 13:32     Assessment and Plan: Atypical sounding chest discomfort in the setting of prior 3-vessel CAD and prior cath in 2025 showing stable non-flow limiting lesions.  Troponin levels are negative.  ECG without overt ischemic changes.  Will defer to AM team on whether or not there's a desire to re-cath.  My view is to consider stress testing; his prior PET scan had identified lesions subsequently stented, so perhaps repeating the same modality for direct comparison.  No indication for heparinization currently  He is on DAPT, and given the remote nature of the stent, may be worth swapping the aspirin  out for a DOAC given the unknown chronicity of his AF.  Can up the metoprolol  dose slightly for better rate control.   Risk  Assessment/Risk Scores:    TIMI Risk Score for Unstable Angina or Non-ST Elevation MI:   The patient's TIMI risk score is 4, which indicates a 20% risk of all cause mortality, new or recurrent myocardial infarction or need for urgent revascularization in the next 14 days.      For questions or updates, please contact [facility] Please consult [email or web address] for contact info under     Signed, Andee Flatten, MD  01/12/2025 9:15 PM     [1]  Allergies Allergen Reactions   Bee Venom Anaphylaxis   Penicillins Anaphylaxis    Did it involve swelling of the face/tongue/throat, SOB, or low BP? yes Did it involve sudden or severe rash/hives, skin peeling, or any reaction on the inside of your mouth or nose? unknown Did you need to seek medical attention at a hospital or doctor's office? yes When did it last happen?      1967 If all above answers are NO, may proceed with cephalosporin use.    Procaine Anaphylaxis   Isosorbide  Other (See Comments)    Caused migraines   Tape Itching and Other (See Comments)    Certain medical tapes make the patient's skin ITCH   "

## 2025-01-12 NOTE — Assessment & Plan Note (Signed)
 Order sliding scale Decrease dose of Lantus  down to 10 units

## 2025-01-12 NOTE — Assessment & Plan Note (Signed)
 If blood pressure stable consider titrating up Toprol  If no interventions needed tomorrow could consider transitioning to Plavix  and DOAC pending cardiology evaluation

## 2025-01-12 NOTE — Assessment & Plan Note (Signed)
 Continue aspirin Plavix and Lipitor

## 2025-01-12 NOTE — ED Triage Notes (Signed)
 Pt c/o L chest pain radiating under L arm following r/t a slip and fall 1/9.  Pain score 2/10.  Pt reports he has been self medicating, but pain has increased x2 days.    Pt reports when he fell, he hit straight on his chest.

## 2025-01-12 NOTE — Assessment & Plan Note (Signed)
 Continue Toprol  200 mg daily

## 2025-01-12 NOTE — ED Provider Notes (Signed)
 " [place] [facility] Provider Note   CSN: [id number] Arrival date & time: 01/12/25  1103     History  Chief Complaint  Patient presents with   Fall   Chest Pain    Jerry Shaffer is a 64 y.o. male with CAD c/b NSTEMI s/p PCI 2021 and subsequently unstable angina s/p PCI 2024 who presents with c/o L chest pain radiating under L arm following r/t a slip and fall 1/9.  Pain score 3/10 but intermittently 10/10.  Pt reports he has been self medicating, but pain has increased x2 days.  Feels like stabbing pain in left side of chest. Has had MI in the past requiring stents but none of them have ever felt the same, so hard to say if this pain is similar. Has known Afib s/p ablation once but is in it intermittently. Takes aspirin  and plavix  and metoprolol . Not sure if the pain is worse with exertion. Has had pain with resting as well.   His last cardiology note from 11/04/24 notes that he hadn't had any aflutter since his ablation and so was off eliquis . Patient states that he has been feeling some afib over the last 1-2 months.    Past Medical History:  Diagnosis Date   Atrial flutter (HCC)    Bipolar 1 disorder (HCC)    Coronary artery disease    Diabetes mellitus    Dyslipidemia    Emphysema    GERD (gastroesophageal reflux disease)    History of kidney stones    Hypertension    Mental disorder    BIPOLAR DISORDER   Migraine    NSTEMI (non-ST elevated myocardial infarction) (HCC) 09/20/2020   Pneumonia    PONV (postoperative nausea and vomiting)    Renal disorder    Shortness of breath    Stroke (HCC)    Transient ischemic attack (TIA)        Home Medications Prior to Admission medications  Medication Sig Start Date End Date Taking? Authorizing Provider  albuterol  (VENTOLIN  HFA) 108 (90 Base) MCG/ACT inhaler INHALE 1 TO 2 PUFFS INTO THE LUNGS EVERY 6 HOURS AS NEEDED FOR WHEEZING OR SHORTNESS OF BREATH 09/06/24  Yes Croitoru, Mihai, MD   atorvastatin  (LIPITOR ) 80 MG tablet Take 1 tablet (80 mg total) by mouth daily. 11/04/24  Yes Croitoru, Mihai, MD  budesonide -glycopyrrolate-formoterol  (BREZTRI  AEROSPHERE) 160-9-4.8 MCG/ACT AERO inhaler Inhale 2 puffs into the lungs in the morning and at bedtime. 10/31/24  Yes Hunsucker, Donnice SAUNDERS, MD  clopidogrel  (PLAVIX ) 75 MG tablet Take 1 tablet (75 mg total) by mouth daily. 11/04/24  Yes Croitoru, Mihai, MD  glipiZIDE  (GLUCOTROL ) 5 MG tablet Take 1 tablet (5 mg total) by mouth 2 (two) times daily before a meal. 11/02/24  Yes Trixie File, MD  insulin  glargine (LANTUS  SOLOSTAR) 100 UNIT/ML Solostar Pen Inject 25-30 Units into the skin at bedtime. Patient taking differently: Inject 15 Units into the skin 2 (two) times daily. Before breakfast and before supper 11/08/24  Yes Trixie File, MD  metFORMIN  (GLUCOPHAGE -XR) 500 MG 24 hr tablet Take 1000mg  2x a day Patient taking differently: Take 1,000 mg by mouth 2 (two) times daily with a meal. 09/30/24  Yes Trixie File, MD  methocarbamol  (ROBAXIN ) 500 MG tablet Take 1 tablet (500 mg total) by mouth every 8 (eight) hours as needed for muscle spasms. 11/04/24  Yes Croitoru, Mihai, MD  metoprolol  succinate (TOPROL -XL) 200 MG 24 hr tablet Take 1 tablet (200 mg total) by  mouth daily. Take with or immediately following a meal. 11/04/24  Yes Croitoru, Mihai, MD  OVER THE COUNTER MEDICATION Take 1 packet by mouth 2 (two) times daily as needed (for severe pain). [employer]  Powder   Yes [provider]  pantoprazole  (PROTONIX ) 40 MG tablet Take 1 tablet (40 mg total) by mouth daily. Patient taking differently: Take 40 mg by mouth daily as needed (for acid reflux). 02/26/24  Yes Croitoru, Mihai, MD  ranolazine  (RANEXA ) 500 MG 12 hr tablet Take 2 tablets (1,000 mg total) by mouth 2 (two) times daily. 11/04/24  Yes Croitoru, Mihai, MD  TRULICITY  0.75 MG/0.5ML SOAJ Inject 0.75 mg into the skin once a week. Patient taking  differently: Inject 0.75 mg into the skin every Monday. 10/17/24  Yes Trixie File, MD  chlorpheniramine-HYDROcodone  (TUSSIONEX) 10-8 MG/5ML Take 5 mLs by mouth every 12 (twelve) hours as needed for cough. Patient not taking: Reported on 01/12/2025 11/26/24   Garrick Charleston, MD  Insulin  Pen Needle 32G X 4 MM MISC Use 1x a day 11/02/24   Trixie File, MD  nitroGLYCERIN  (NITROSTAT ) 0.4 MG SL tablet Place 1 tablet (0.4 mg total) under the tongue every 5 (five) minutes as needed for chest pain. Patient not taking: Reported on 01/12/2025 12/08/23 03/25/25  Jerilynn Lamarr HERO, NP  Spacer/Aero-Holding Raguel FRENCH Use with inhalers 10/31/24   Hunsucker, Donnice SAUNDERS, MD      Allergies    Bee venom, Penicillins, Procaine, Isosorbide , and Tape    Review of Systems   Review of Systems A 10 point review of systems was performed and is negative unless otherwise reported in HPI.  Physical Exam Updated Vital Signs BP (!) 135/100 (BP Location: Right Arm)   Pulse 92   Temp 98.8 F (37.1 C) (Oral)   Resp 17   Ht 5' 7 (1.702 m)   Wt 118.4 kg   SpO2 100%   BMI 40.88 kg/m  Physical Exam General: Normal appearing male, lying in bed.  HEENT: PERRLA, Sclera anicteric, MMM, trachea midline.  Cardiology: Irregularly irregular rhythm, no murmurs/rubs/gallops.  Resp: Normal respiratory rate and effort. CTAB, no wheezes, rhonchi, crackles.  Abd: Soft, non-tender, non-distended. No rebound tenderness or guarding.  GU: Deferred. MSK: No peripheral edema or signs of trauma. Extremities without deformity or TTP. No cyanosis or clubbing. Skin: warm, dry.  Neuro: A&Ox4, CNs II-XII grossly intact. MAEs. Sensation grossly intact.  Psych: Normal mood and affect.   ED Results / Procedures / Treatments   Labs (all labs ordered are listed, but only abnormal results are displayed) Labs Reviewed  BASIC METABOLIC PANEL WITH GFR - Abnormal; Notable for the following components:      Result Value   Glucose,  Bld 240 (*)    Calcium  8.8 (*)    All other components within normal limits  CBC - Abnormal; Notable for the following components:   WBC 12.3 (*)    All other components within normal limits  HEPATIC FUNCTION PANEL - Abnormal; Notable for the following components:   Alkaline Phosphatase 127 (*)    All other components within normal limits  CBG MONITORING, ED - Abnormal; Notable for the following components:   Glucose-Capillary 123 (*)    All other components within normal limits  TROPONIN T, HIGH SENSITIVITY - Abnormal; Notable for the following components:   Troponin T High Sensitivity 20 (*)    All other components within normal limits  HEMOGLOBIN A1C  TROPONIN T, HIGH SENSITIVITY  EKG None  Radiology CT Chest W Contrast Result Date: 01/12/2025 CLINICAL DATA:  Blunt trauma to the chest.  Pain. EXAM: CT CHEST WITH CONTRAST TECHNIQUE: Multidetector CT imaging of the chest was performed during intravenous contrast administration. RADIATION DOSE REDUCTION: This exam was performed according to the departmental dose-optimization program which includes automated exposure control, adjustment of the mA and/or kV according to patient size and/or use of iterative reconstruction technique. CONTRAST:  75mL OMNIPAQUE  IOHEXOL  350 MG/ML SOLN COMPARISON:  Chest radiograph dated 01/12/2025 and CT dated 03/18/2024. FINDINGS: Cardiovascular: There is no cardiomegaly. Trace pericardial effusion. Three-vessel coronary vascular calcification and coronary stent in the LAD. Mild atherosclerotic calcification of the aortic arch. No intimal dilatation or dissection. The origins of the great vessels of the aortic arch appear patent. No pulmonary artery embolus identified. Mediastinum/Nodes: No hilar or mediastinal adenopathy. The esophagus is grossly unremarkable. No mediastinal fluid collection. Lungs/Pleura: No focal consolidation, pleural effusion, pneumothorax. The central airways are patent. Upper Abdomen: No  acute abnormality. Musculoskeletal: No acute osseous pathology. No displaced rib fractures. Old healed fracture of the upper sternal body. IMPRESSION: 1. No acute intrathoracic pathology. 2. Three-vessel coronary vascular calcification and coronary stent in the LAD. 3.  Aortic Atherosclerosis (ICD10-I70.0). Electronically Signed   By: Vanetta Chou M.D.   On: 01/12/2025 20:09   DG Chest 2 View Result Date: 01/12/2025 CLINICAL DATA:  Chest pain. EXAM: CHEST - 2 VIEW COMPARISON:  Chest radiograph dated 12/15/2024. FINDINGS: The heart size and mediastinal contours are within normal limits. Both lungs are clear. The visualized skeletal structures are unremarkable. IMPRESSION: No active cardiopulmonary disease. Electronically Signed   By: Vanetta Chou M.D.   On: 01/12/2025 13:32    Procedures Procedures    Medications Ordered in ED Medications  insulin  aspart (novoLOG ) injection 0-9 Units (1 Units Subcutaneous Given 01/12/25 2221)  insulin  glargine (LANTUS ) injection 10 Units (10 Units Subcutaneous Given 01/12/25 2250)  iohexol  (OMNIPAQUE ) 350 MG/ML injection 75 mL (75 mLs Intravenous Contrast Given 01/12/25 1954)    ED Course/ Medical Decision Making/ A&P                          Medical Decision Making Amount and/or Complexity of Data Reviewed Labs: ordered. Decision-making details documented in ED Course. Radiology: ordered. Decision-making details documented in ED Course.  Risk Prescription drug management. Decision regarding hospitalization.    This patient presents to the ED for concern of atypical chest pain, this involves an extensive number of treatment options, and is a complaint that carries with it a high risk of complications and morbidity.  I considered the following differential and admission for this acute, potentially life threatening condition.  Patient is well-appearing and hemodynamically stable.  MDM:    DDX for chest pain includes but is not limited  to:  Patient has a history of MI and unstable angina requiring stents.  He reports atypical chest pain at this time that started after fall but is now worsening.  Cannot say if it is exertional and is not associate with any nausea or vomiting.  Considered intrathoracic trauma such as contusion, rib fracture.  Chest x-ray was negative for any hemopneumothorax.  CT chest did not show any traumatic pathology.  EKG without any signs of ischemia or pericarditis.  He is newly in A-fib and per the cardiology note he has not had A-fib since his ablation.  He is only taking DAPT but no other anticoagulation, and this will need  to be addressed as well. Could be contributing to his discomfort. No RVR at this time, rates <100 bpm. No electrolyte derangements. His troponin is 20 and 16 which is relatively stable, not having a STEMI or NSTEMI but could consider angina or unstable angina.  CT chest also did not demonstrate any pulmonary embolism or acute aortic syndrome.  Will consult with cardiology.   Clinical Course as of 01/12/25 2305  Thu Jan 12, 2025  1857 DG Chest 2 View No active cardiopulmonary disease. [HN]  2013 CT Chest W Contrast 1. No acute intrathoracic pathology. 2. Three-vessel coronary vascular calcification and coronary stent in the LAD. 3.  Aortic Atherosclerosis (ICD10-I70.0).  [HN]  2013 Troponin T High Sensitivity: 16 20 --> 16 [HN]  2051 Discussed with Dr. Cesario with cardiology who recommends inpatient observation with potential cath tomorrow.  Recommends hospitalist admission.  Consulted to hospitalist for admission [HN]    Clinical Course User Index [HN] Franklyn Sid SAILOR, MD    Labs: I Ordered, and personally interpreted labs.  The pertinent results include:  those listed above  Imaging Studies ordered: I ordered imaging studies including CT chest w contrast. CXR ordered from triage.  I independently visualized and interpreted imaging. I agree with the radiologist  interpretation  Additional history obtained from chart review.   Cardiac Monitoring: The patient was maintained on a cardiac monitor.  I personally viewed and interpreted the cardiac monitored which showed an underlying rhythm of: atrial fibrillation  Reevaluation: After the interventions noted above, I reevaluated the patient and found that they have :stayed the same  Social Determinants of Health: Lives independently  Disposition: Admit to hospitalist with cardiology following  Co morbidities that complicate the patient evaluation  Past Medical History:  Diagnosis Date   Atrial flutter (HCC)    Bipolar 1 disorder (HCC)    Coronary artery disease    Diabetes mellitus    Dyslipidemia    Emphysema    GERD (gastroesophageal reflux disease)    History of kidney stones    Hypertension    Mental disorder    BIPOLAR DISORDER   Migraine    NSTEMI (non-ST elevated myocardial infarction) (HCC) 09/20/2020   Pneumonia    PONV (postoperative nausea and vomiting)    Renal disorder    Shortness of breath    Stroke (HCC)    Transient ischemic attack (TIA)      Medicines Meds ordered this encounter  Medications   iohexol  (OMNIPAQUE ) 350 MG/ML injection 75 mL   insulin  aspart (novoLOG ) injection 0-9 Units    Correction coverage::   Sensitive (thin, NPO, renal)    CBG < 70::   Implement Hypoglycemia Standing Orders and refer to Hypoglycemia Standing Orders sidebar report    CBG 70 - 120::   0 units    CBG 121 - 150::   1 unit    CBG 151 - 200::   2 units    CBG 201 - 250::   3 units    CBG 251 - 300::   5 units    CBG 301 - 350::   7 units    CBG 351 - 400:   9 units    CBG > 400:   call MD and obtain STAT lab verification   insulin  glargine (LANTUS ) injection 10 Units    I have reviewed the patients home medicines and have made adjustments as needed  Problem List / ED Course: Problem List Items Addressed This Visit   None Visit  Diagnoses       Atypical chest pain    -   Primary     Atrial fibrillation, unspecified type [facility])                       This note was created using dictation software, which may contain spelling or grammatical errors.    Franklyn Sid SAILOR, MD 01/12/25 [phone]  "

## 2025-01-12 NOTE — ED Notes (Signed)
 Pt brought into triage for repeat labwork. Pt reports dizziness when standing. VS reassessed by RN. Pt reports dizziness is resolved when sitting down. Pt reports he has been having intermittent dizziness.

## 2025-01-12 NOTE — Assessment & Plan Note (Signed)
 Appreciate cardiology consult Continue Lipitor  80 mg daily continue beta-blocker defer to a.m. team if able to titrate up

## 2025-01-13 ENCOUNTER — Observation Stay (HOSPITAL_COMMUNITY)

## 2025-01-13 ENCOUNTER — Other Ambulatory Visit (HOSPITAL_COMMUNITY): Payer: Self-pay

## 2025-01-13 ENCOUNTER — Other Ambulatory Visit: Payer: Self-pay | Admitting: Adult Health

## 2025-01-13 DIAGNOSIS — R0789 Other chest pain: Principal | ICD-10-CM

## 2025-01-13 DIAGNOSIS — I10 Essential (primary) hypertension: Secondary | ICD-10-CM | POA: Diagnosis not present

## 2025-01-13 DIAGNOSIS — R079 Chest pain, unspecified: Secondary | ICD-10-CM | POA: Diagnosis not present

## 2025-01-13 DIAGNOSIS — Z9861 Coronary angioplasty status: Secondary | ICD-10-CM | POA: Diagnosis not present

## 2025-01-13 DIAGNOSIS — I2510 Atherosclerotic heart disease of native coronary artery without angina pectoris: Secondary | ICD-10-CM | POA: Diagnosis not present

## 2025-01-13 DIAGNOSIS — I480 Paroxysmal atrial fibrillation: Secondary | ICD-10-CM | POA: Diagnosis not present

## 2025-01-13 LAB — CBC
HCT: 42.6 % (ref 39.0–52.0)
Hemoglobin: 14.1 g/dL (ref 13.0–17.0)
MCH: 29 pg (ref 26.0–34.0)
MCHC: 33.1 g/dL (ref 30.0–36.0)
MCV: 87.7 fL (ref 80.0–100.0)
Platelets: 251 10*3/uL (ref 150–400)
RBC: 4.86 MIL/uL (ref 4.22–5.81)
RDW: 14.1 % (ref 11.5–15.5)
WBC: 11.2 10*3/uL — ABNORMAL HIGH (ref 4.0–10.5)
nRBC: 0 % (ref 0.0–0.2)

## 2025-01-13 LAB — COMPREHENSIVE METABOLIC PANEL WITH GFR
ALT: 18 U/L (ref 0–44)
AST: 19 U/L (ref 15–41)
Albumin: 3.9 g/dL (ref 3.5–5.0)
Alkaline Phosphatase: 109 U/L (ref 38–126)
Anion gap: 9 (ref 5–15)
BUN: 15 mg/dL (ref 8–23)
CO2: 26 mmol/L (ref 22–32)
Calcium: 8.8 mg/dL — ABNORMAL LOW (ref 8.9–10.3)
Chloride: 104 mmol/L (ref 98–111)
Creatinine, Ser: 0.75 mg/dL (ref 0.61–1.24)
GFR, Estimated: >60 mL/min
Glucose, Bld: 117 mg/dL — ABNORMAL HIGH (ref 70–99)
Potassium: 4 mmol/L (ref 3.5–5.1)
Sodium: 138 mmol/L (ref 135–145)
Total Bilirubin: 0.5 mg/dL (ref 0.0–1.2)
Total Protein: 6.1 g/dL — ABNORMAL LOW (ref 6.5–8.1)

## 2025-01-13 LAB — ECHOCARDIOGRAM COMPLETE
AR max vel: 2.73 cm2
AV Area VTI: 2.83 cm2
AV Area mean vel: 2.41 cm2
AV Mean grad: 4 mmHg
AV Peak grad: 6.2 mmHg
Ao pk vel: 1.24 m/s
Area-P 1/2: 3.08 cm2
Calc EF: 60.1 %
Height: 67 in
MV VTI: 3.94 cm2
S' Lateral: 3.5 cm
Single Plane A2C EF: 55.4 %
Single Plane A4C EF: 63.1 %
Weight: 4176 [oz_av]

## 2025-01-13 LAB — HEMOGLOBIN A1C
Hgb A1c MFr Bld: 7.9 % — ABNORMAL HIGH (ref 4.8–5.6)
Mean Plasma Glucose: 180.03 mg/dL

## 2025-01-13 LAB — CBG MONITORING, ED
Glucose-Capillary: 127 mg/dL — ABNORMAL HIGH (ref 70–99)
Glucose-Capillary: 146 mg/dL — ABNORMAL HIGH (ref 70–99)
Glucose-Capillary: 230 mg/dL — ABNORMAL HIGH (ref 70–99)

## 2025-01-13 LAB — PHOSPHORUS: Phosphorus: 2.6 mg/dL (ref 2.5–4.6)

## 2025-01-13 LAB — MAGNESIUM: Magnesium: 1.6 mg/dL — ABNORMAL LOW (ref 1.7–2.4)

## 2025-01-13 MED ORDER — ONDANSETRON HCL 4 MG/2ML IJ SOLN: 4.0000 mg | Freq: Four times a day (QID) | INTRAMUSCULAR | Status: DC | PRN

## 2025-01-13 MED ORDER — MAGNESIUM SULFATE 2 GM/50ML IV SOLN
2.0000 g | Freq: Once | INTRAVENOUS | Status: AC
  Administered 2025-01-13: 2 g via INTRAVENOUS
  Filled 2025-01-13: qty 50

## 2025-01-13 MED ORDER — BUDESON-GLYCOPYRROL-FORMOTEROL 160-9-4.8 MCG/ACT IN AERO: 2.0000 | INHALATION_SPRAY | Freq: Two times a day (BID) | RESPIRATORY_TRACT | Status: DC

## 2025-01-13 MED ORDER — SODIUM CHLORIDE 0.9% FLUSH
3.0000 mL | Freq: Two times a day (BID) | INTRAVENOUS | Status: DC
  Administered 2025-01-13: 3 mL via INTRAVENOUS

## 2025-01-13 MED ORDER — ATORVASTATIN CALCIUM 40 MG PO TABS: 80.0000 mg | ORAL_TABLET | Freq: Every day | ORAL | Status: DC

## 2025-01-13 MED ORDER — PERFLUTREN LIPID MICROSPHERE
1.0000 mL | INTRAVENOUS | Status: AC | PRN
  Administered 2025-01-13: 3 mL via INTRAVENOUS

## 2025-01-13 MED ORDER — SODIUM CHLORIDE 0.9 % IV SOLN: 250.0000 mL | INTRAVENOUS | Status: DC | PRN

## 2025-01-13 MED ORDER — LIDOCAINE 5 % EX PTCH
1.0000 | MEDICATED_PATCH | CUTANEOUS | 0 refills | Status: AC
  Filled 2025-01-13: qty 14, 14d supply, fill #0

## 2025-01-13 MED ORDER — ACETAMINOPHEN 650 MG RE SUPP: 650.0000 mg | Freq: Four times a day (QID) | RECTAL | Status: DC | PRN

## 2025-01-13 MED ORDER — APIXABAN 5 MG PO TABS
5.0000 mg | ORAL_TABLET | Freq: Two times a day (BID) | ORAL | 0 refills | Status: AC
  Filled 2025-01-13: qty 60, 30d supply, fill #0

## 2025-01-13 MED ORDER — ONDANSETRON HCL 4 MG PO TABS: 4.0000 mg | ORAL_TABLET | Freq: Four times a day (QID) | ORAL | Status: DC | PRN

## 2025-01-13 MED ORDER — ACETAMINOPHEN 325 MG PO TABS: 650.0000 mg | ORAL_TABLET | Freq: Four times a day (QID) | ORAL | Status: DC | PRN

## 2025-01-13 MED ORDER — HYDROCODONE-ACETAMINOPHEN 5-325 MG PO TABS: 1.0000 | ORAL_TABLET | ORAL | Status: DC | PRN

## 2025-01-13 MED ORDER — METOPROLOL TARTRATE 25 MG PO TABS
100.0000 mg | ORAL_TABLET | Freq: Two times a day (BID) | ORAL | Status: DC
  Administered 2025-01-13 (×2): 100 mg via ORAL
  Filled 2025-01-13 (×2): qty 4

## 2025-01-13 MED ORDER — PANTOPRAZOLE SODIUM 40 MG PO TBEC: 40.0000 mg | DELAYED_RELEASE_TABLET | Freq: Every day | ORAL | Status: DC | PRN

## 2025-01-13 MED ORDER — CLOPIDOGREL BISULFATE 75 MG PO TABS
75.0000 mg | ORAL_TABLET | Freq: Every day | ORAL | Status: DC
  Administered 2025-01-13: 75 mg via ORAL
  Filled 2025-01-13: qty 1

## 2025-01-13 MED ORDER — SODIUM CHLORIDE 0.9% FLUSH: 3.0000 mL | INTRAVENOUS | Status: DC | PRN

## 2025-01-13 MED ORDER — FENTANYL CITRATE (PF) 50 MCG/ML IJ SOSY: 12.5000 ug | PREFILLED_SYRINGE | INTRAMUSCULAR | Status: DC | PRN

## 2025-01-13 MED ORDER — ASPIRIN 81 MG PO TBEC
81.0000 mg | DELAYED_RELEASE_TABLET | Freq: Every day | ORAL | Status: DC
  Administered 2025-01-13: 81 mg via ORAL
  Filled 2025-01-13: qty 1

## 2025-01-13 MED ORDER — METHOCARBAMOL 500 MG PO TABS: 500.0000 mg | ORAL_TABLET | Freq: Three times a day (TID) | ORAL | Status: DC | PRN

## 2025-01-13 NOTE — ED Notes (Signed)
Pt was given discharge instructions and verbalized understanding.

## 2025-01-13 NOTE — Progress Notes (Signed)
"  °  Progress Note  Patient Name: Jerry Shaffer Date of Encounter: 01/13/2025 [facility] Cardiologist: Jerel Balding, MD   Interval Summary    Still having some left-sided chest pain but this is worse with palpation.  Converted to sinus rhythm around 2:45 AM  Vital Signs Vitals:   01/13/25 0555 01/13/25 0615 01/13/25 0649 01/13/25 0749  BP:  131/74    Pulse:  85    Resp: 19 18    Temp:   98.6 F (37 C)   TempSrc:   Oral   SpO2:  96%  98%  Weight:      Height:       No intake or output data in the 24 hours ending 01/13/25 0816    01/12/2025   11:29 AM 12/15/2024    9:16 PM 11/04/2024    8:53 AM  Last 3 Weights  Weight (lbs) 261 lb 261 lb 264 lb  Weight (kg) 118.389 kg 118.389 kg 119.75 kg      Telemetry/ECG  Atrial fibrillation with conversion to sinus rhythm around 0245am and maintaining - Personally Reviewed  Physical Exam  GEN: No acute distress.   Neck: No JVD Cardiac: RRR, no murmurs, rubs, or gallops.  Respiratory: Clear to auscultation bilaterally. GI: Soft, nontender, non-distended  MS: No edema  Assessment & Plan   64 year old male with past medical history of CAD status post inferior STEMI '21 (PCI to RCA with subsequent stenting to the proximal LAD '22), hyperlipidemia, COPD, diabetes, obesity, atrial flutter status post ablation who presented with chest pain.  Chest pain -- States that he had a fall a couple of weeks ago onto his left side and thinks he separated his ribs.  Since that time he has been having issues with intermittent sharp stabbing left-sided chest pain. -- High-sensitivity troponin 20>>16, EKG on admission with atrial fibrillation without acute ischemic changes -- He is quite tender to palpation to the left anterior chest around his rib area.  States this is very different from his previous anginal symptoms requiring stenting.  Suspect more likely musculoskeletal.Will review with MD  -- echo pending   CAD -- Catheterization  03/2024 stable three-vessel CAD with widely patent stents in LAD, OM and PDA -- Continue Plavix , atorvastatin   Atrial flutter status post ablation New onset atrial fibrillation -- EKG on admission shows atrial fibrillation, he states that he has had intermittent episodes of palpitations from time to time.  Unclear duration of this current episode of atrial fibrillation as he was generally asymptomatic.  He has not been on oral anticoagulation after his ablation due to no recurrent episodes. -- Has since converted to sinus rhythm while in the ED but will likely need to resume Eliquis  with extended duration   Per primary  Diabetes COPD GERD  For questions or updates, please contact  [facility] Please consult [email or web address] for contact info under         Signed, Manuelita Rummer, NP   "

## 2025-01-13 NOTE — Progress Notes (Signed)
 PT Cancellation Note  Patient Details Name: Jerry Shaffer MRN: [id number] DOB: 1961-04-12   Cancelled Treatment:    Reason Eval/Treat Not Completed: PT screened, no needs identified, will sign off. RN reports patient preparing for d/c from ED with no PT needs before leaving. Acute PT will sign off. Please reorder if pt to remain admitted and new needs arise.  Larnell Granlund, PT, DPT Acute Rehabilitation Services  Personal: [facility] Rehab Office: [phone]  Krystal Delduca L Coda Mathey 01/13/2025, 12:32 PM

## 2025-01-20 ENCOUNTER — Other Ambulatory Visit: Payer: Self-pay | Admitting: Cardiovascular Disease

## 2025-01-27 ENCOUNTER — Ambulatory Visit: Admitting: Physician Assistant

## 2025-01-31 ENCOUNTER — Ambulatory Visit: Admitting: Pulmonary Disease

## 2025-02-02 ENCOUNTER — Ambulatory Visit: Admitting: Internal Medicine

## 2025-02-15 ENCOUNTER — Ambulatory Visit: Admitting: Internal Medicine
